# Patient Record
Sex: Female | Born: 1940 | Race: White | Hispanic: No | Marital: Married | Smoking: Never smoker
Health system: Southern US, Community
[De-identification: ages and names within clinical notes are randomized; demographics above are authoritative.]

## PROBLEM LIST (undated history)

## (undated) DIAGNOSIS — E785 Hyperlipidemia, unspecified: Secondary | ICD-10-CM

## (undated) DIAGNOSIS — F32A Depression, unspecified: Secondary | ICD-10-CM

## (undated) DIAGNOSIS — I4891 Unspecified atrial fibrillation: Secondary | ICD-10-CM

## (undated) DIAGNOSIS — I10 Essential (primary) hypertension: Secondary | ICD-10-CM

## (undated) DIAGNOSIS — R011 Cardiac murmur, unspecified: Secondary | ICD-10-CM

## (undated) DIAGNOSIS — I872 Venous insufficiency (chronic) (peripheral): Secondary | ICD-10-CM

## (undated) DIAGNOSIS — J45909 Unspecified asthma, uncomplicated: Secondary | ICD-10-CM

## (undated) DIAGNOSIS — M1990 Unspecified osteoarthritis, unspecified site: Secondary | ICD-10-CM

## (undated) DIAGNOSIS — I509 Heart failure, unspecified: Secondary | ICD-10-CM

## (undated) DIAGNOSIS — G709 Myoneural disorder, unspecified: Secondary | ICD-10-CM

## (undated) DIAGNOSIS — I499 Cardiac arrhythmia, unspecified: Secondary | ICD-10-CM

## (undated) DIAGNOSIS — M797 Fibromyalgia: Secondary | ICD-10-CM

## (undated) DIAGNOSIS — J189 Pneumonia, unspecified organism: Secondary | ICD-10-CM

## (undated) DIAGNOSIS — T7840XA Allergy, unspecified, initial encounter: Secondary | ICD-10-CM

## (undated) HISTORY — DX: Venous insufficiency (chronic) (peripheral): I87.2

## (undated) HISTORY — DX: Hyperlipidemia, unspecified: E78.5

## (undated) HISTORY — PX: KNEE SURGERY: SHX244

## (undated) HISTORY — DX: Myoneural disorder, unspecified: G70.9

## (undated) HISTORY — PX: ANKLE SURGERY: SHX546

## (undated) HISTORY — DX: Heart failure, unspecified: I50.9

## (undated) HISTORY — DX: Essential (primary) hypertension: I10

## (undated) HISTORY — DX: Allergy, unspecified, initial encounter: T78.40XA

## (undated) HISTORY — PX: VARICOSE VEIN SURGERY: SHX832

---

## 1998-01-15 ENCOUNTER — Encounter: Payer: Self-pay | Admitting: Obstetrics and Gynecology

## 1998-01-15 ENCOUNTER — Ambulatory Visit (HOSPITAL_COMMUNITY): Admission: RE | Admit: 1998-01-15 | Discharge: 1998-01-15 | Payer: Self-pay | Admitting: Obstetrics and Gynecology

## 1999-01-07 ENCOUNTER — Other Ambulatory Visit: Admission: RE | Admit: 1999-01-07 | Discharge: 1999-01-07 | Payer: Self-pay | Admitting: Obstetrics and Gynecology

## 1999-01-17 ENCOUNTER — Ambulatory Visit (HOSPITAL_COMMUNITY): Admission: RE | Admit: 1999-01-17 | Discharge: 1999-01-17 | Payer: Self-pay | Admitting: Obstetrics and Gynecology

## 1999-01-17 ENCOUNTER — Encounter: Payer: Self-pay | Admitting: Obstetrics and Gynecology

## 2000-02-09 ENCOUNTER — Ambulatory Visit (HOSPITAL_COMMUNITY): Admission: RE | Admit: 2000-02-09 | Discharge: 2000-02-09 | Payer: Self-pay | Admitting: Obstetrics and Gynecology

## 2000-02-09 ENCOUNTER — Encounter: Payer: Self-pay | Admitting: Obstetrics and Gynecology

## 2000-02-26 ENCOUNTER — Ambulatory Visit (HOSPITAL_COMMUNITY): Admission: RE | Admit: 2000-02-26 | Discharge: 2000-02-26 | Payer: Self-pay | Admitting: Internal Medicine

## 2000-05-14 ENCOUNTER — Ambulatory Visit (HOSPITAL_COMMUNITY): Admission: RE | Admit: 2000-05-14 | Discharge: 2000-05-14 | Payer: Self-pay | Admitting: Gastroenterology

## 2001-06-10 ENCOUNTER — Encounter: Payer: Self-pay | Admitting: Obstetrics and Gynecology

## 2001-06-10 ENCOUNTER — Ambulatory Visit (HOSPITAL_COMMUNITY): Admission: RE | Admit: 2001-06-10 | Discharge: 2001-06-10 | Payer: Self-pay | Admitting: Obstetrics and Gynecology

## 2002-06-13 ENCOUNTER — Encounter: Payer: Self-pay | Admitting: Obstetrics and Gynecology

## 2002-06-13 ENCOUNTER — Ambulatory Visit (HOSPITAL_COMMUNITY): Admission: RE | Admit: 2002-06-13 | Discharge: 2002-06-13 | Payer: Self-pay | Admitting: Internal Medicine

## 2003-06-21 ENCOUNTER — Ambulatory Visit (HOSPITAL_COMMUNITY): Admission: RE | Admit: 2003-06-21 | Discharge: 2003-06-21 | Payer: Self-pay | Admitting: Obstetrics and Gynecology

## 2004-07-02 ENCOUNTER — Ambulatory Visit (HOSPITAL_COMMUNITY): Admission: RE | Admit: 2004-07-02 | Discharge: 2004-07-02 | Payer: Self-pay | Admitting: Obstetrics and Gynecology

## 2004-12-24 ENCOUNTER — Encounter: Admission: RE | Admit: 2004-12-24 | Discharge: 2004-12-24 | Payer: Self-pay | Admitting: Internal Medicine

## 2005-07-06 ENCOUNTER — Ambulatory Visit (HOSPITAL_COMMUNITY): Admission: RE | Admit: 2005-07-06 | Discharge: 2005-07-06 | Payer: Self-pay | Admitting: Internal Medicine

## 2005-08-17 ENCOUNTER — Encounter: Admission: RE | Admit: 2005-08-17 | Discharge: 2005-08-17 | Payer: Self-pay | Admitting: Internal Medicine

## 2006-07-12 ENCOUNTER — Ambulatory Visit (HOSPITAL_COMMUNITY): Admission: RE | Admit: 2006-07-12 | Discharge: 2006-07-12 | Payer: Self-pay | Admitting: Obstetrics and Gynecology

## 2006-08-25 ENCOUNTER — Ambulatory Visit: Payer: Self-pay | Admitting: Vascular Surgery

## 2006-12-10 ENCOUNTER — Ambulatory Visit: Payer: Self-pay | Admitting: Vascular Surgery

## 2007-07-13 ENCOUNTER — Ambulatory Visit (HOSPITAL_COMMUNITY): Admission: RE | Admit: 2007-07-13 | Discharge: 2007-07-13 | Payer: Self-pay | Admitting: Obstetrics and Gynecology

## 2007-08-05 ENCOUNTER — Ambulatory Visit: Payer: Self-pay | Admitting: Vascular Surgery

## 2007-08-31 ENCOUNTER — Ambulatory Visit: Payer: Self-pay | Admitting: Vascular Surgery

## 2007-09-07 ENCOUNTER — Ambulatory Visit: Payer: Self-pay | Admitting: Vascular Surgery

## 2008-01-04 ENCOUNTER — Ambulatory Visit: Payer: Self-pay | Admitting: Vascular Surgery

## 2008-01-11 ENCOUNTER — Ambulatory Visit: Payer: Self-pay | Admitting: Vascular Surgery

## 2008-02-24 ENCOUNTER — Ambulatory Visit: Payer: Self-pay | Admitting: Vascular Surgery

## 2008-07-30 ENCOUNTER — Ambulatory Visit (HOSPITAL_COMMUNITY): Admission: RE | Admit: 2008-07-30 | Discharge: 2008-07-30 | Payer: Self-pay | Admitting: Obstetrics and Gynecology

## 2009-08-01 ENCOUNTER — Ambulatory Visit (HOSPITAL_COMMUNITY): Admission: RE | Admit: 2009-08-01 | Discharge: 2009-08-01 | Payer: Self-pay | Admitting: Obstetrics and Gynecology

## 2010-04-30 ENCOUNTER — Encounter: Payer: Self-pay | Admitting: Internal Medicine

## 2010-06-03 NOTE — Procedures (Signed)
DUPLEX DEEP VENOUS EXAM - LOWER EXTREMITY   INDICATION:  Followup evaluation status post left greater saphenous vein  ablation.   HISTORY:  Edema:  Edema in the left leg has resolved considerably since  the ablation last week.  Trauma/Surgery:  EVLT of left greater saphenous vein on 08/31/2007.  Pain:  Left thigh pain.  PE:  No.  Previous DVT:  No.  Anticoagulants:  No.  Other:  Known incompetent right greater saphenous vein.   DUPLEX EXAM:                CFV   SFV   PopV  PTV    GSV                R  L  R  L  R  L  R   L  R  L  Thrombosis    0  0     0     0      0  0  +  Spontaneous   +  +     +     +      +  +  0  Phasic        +  +     +     +      +  +  0  Augmentation  +  +     +     +      +  +  0  Compressible  +  +     +     +      +  +  0  Competent     0  +     +     +      +  0  0   Legend:  + - yes  o - no  p - partial  D - decreased   IMPRESSION:  1. 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  2. The left common femoral vein is patent with no thrombus within.  3. Left circumflex vein is patent.  4. No left leg DVT or Baker's cyst.  5. Right common femoral vein and greater saphenous veins are      incompetent.    _____________________________  Larina Earthly, M.D.   MC/MEDQ  D:  09/08/2007  T:  09/08/2007  Job:  [id number]

## 2010-06-03 NOTE — Procedures (Signed)
DUPLEX DEEP VENOUS EXAM - LOWER EXTREMITY   INDICATION:  Followup right greater saphenous vein ablation.   HISTORY:  Edema:  No.  Trauma/Surgery:  Yes.  Pain:  Mild.  PE:  No.  Previous DVT:  No.  Anticoagulants:  None.  Other:   DUPLEX EXAM:                CFV   SFV   PopV  PTV    GSV                R  L  R  L  R  L  R   L  R  L  Thrombosis    0  0  0     0     0      +  Spontaneous   0     0     0     0      0  Phasic        0     0     0     0      0  Augmentation  0     0     0     0      0  Compressible  0     0     0     0      0  Competent           0     0     0      0   Legend:  + - yes  o - no  p - partial  D - decreased   IMPRESSION:  1. No evidence of DVT noted in the right leg.  2. The right greater saphenous vein appears ablated from the      saphenofemoral junction to below knee level.    _____________________________  Larina Earthly, M.D.   MG/MEDQ  D:  01/11/2008  T:  01/11/2008  Job:  [id number]

## 2010-06-03 NOTE — Assessment & Plan Note (Signed)
OFFICE VISIT   [name], Brandi  DOB:  May 08, 1940                                       08/05/2007  CHART#:[id number]   The patient presents today for continued followup of her severe venous  hypertension and venous varicosities.  She reports that she is having  increasingly severe discomfort with prolonged standing, this is more  severe on her left leg than on her right.  She has pain with prolonged  standing with the swelling and pain over varicosities as well.  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  She also reports  bending and squatting are very painful and difficult due to leg pain and  swelling, which makes it difficult to do housework.  She does live in a  2-storey house and reports going up and down stairs difficult due to leg  pain and swelling as well.  She has work graduated compression stockings  20-30 mmHg for greater than 3 months with no relief of symptoms.  She  does elevate her legs when possible and does take non-steroidal anti  inflammatory ibuprofen 600 mg t.i.d. without improvement of her  symptoms.  She does have palpable pedal pulses bilaterally and has  marked varicosities bilaterally.  Her deep venous duplex has shown no  incompetence in her deep system with gross reflux in her saphenous veins  bilaterally.  I discussed options with the patient.  I have recommended  laser ablation and stab phlebectomy for relief of her symptoms.  She  understands this and wishes to proceed at her earliest convenience.   Larina Earthly, M.D.  Electronically Signed   TFE/MEDQ  D:  08/05/2007  T:  08/08/2007  Job:  [id number]   cc:   Minerva Areola L. August Saucer, M.D.
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
## 2010-06-03 NOTE — Assessment & Plan Note (Signed)
OFFICE VISIT   [name], Brandi  DOB:  [birth year]                                       12/10/2006  CHART#:[id number]   The patient presents for continued followup of her severe bilateral  venous hypertension.  She has worn graduated compression stockings for  greater than 3 months and has had no relief of her symptoms.  She  continues to elevate her legs when possible, but has no relief of her  symptoms related to this, and also takes ibuprofen 600 mg t.i.d. for her  discomfort.  She reports that she continues to have discomfort with  prolonged standing and other activities of daily living.  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  She also reports difficulty going up  and down 3 flights of stairs secondary to pain with this, and also has  difficulty performing her housework.  She reports also pain with sitting  to drive for long periods of time, and also reports difficulty falling  asleep secondary to leg pain.  She underwent formal duplex today in our  office, and this confirms incompetence of her saphenous vein  bilaterally.  There is no evidence of tortuosity or aneurismal change in  her saphenous veins.  I discussed options with the patient and  recommended that we proceed with staged bilateral laser ablation of her  saphenous veins and tributary phlebectomies for the relief of her  symptoms.  She understands this and wishes to proceed as soon as we have  assured insurance coverage.   Larina Earthly, M.D.  Electronically Signed   TFE/MEDQ  D:  12/10/2006  T:  12/13/2006  Job:  [id number]

## 2010-06-03 NOTE — Assessment & Plan Note (Signed)
OFFICE VISIT   Largent, Michala  DOB:  05-11-1940                                       01/11/2008  CHART#:[id number]   The patient presents today for a 1 week followup after laser ablation of  her right great saphenous vein and stab phlebectomy of multiple  tributary varicosities.  She has done quite well with her procedure.  She does have some erythema and thickening in the laser portion in the  above knee medial thigh.  The stab sites all look quite good with the  usual amount of bruising in her calf.   Her duplex today reveals closure of her saphenous vein from her knee up  to her saphenofemoral junction and no evidence of deep venous  thrombosis.  I am quite pleased with her initial result as is the  patient.  I plan to see her again in 6 weeks for final followup.   Larina Earthly, M.D.  Electronically Signed   TFE/MEDQ  D:  01/11/2008  T:  01/12/2008  Job:  [id number]   cc:   Minerva Areola L. August Saucer, M.D.

## 2010-06-03 NOTE — Assessment & Plan Note (Signed)
OFFICE VISIT   Brandi [name], Brandi [name]  DOB:  22-Jul-1940                                       02/24/2008  CHART#:[id number]   The patient presents today for continued follow-up after her right leg  laser ablation, stab phlebectomy and multiple tributary varicosities.  She is quite pleased with her result.  She has resolved all of  periprocedural bruising and soreness.  She reports that her leg feels  much better and she is not having any difficulty related to her veins.   PHYSICAL EXAMINATION:  Stab phlebectomy sites have all healed and she  does not have any swelling.   I am pleased with her result and she will see [place] again on an as-needed  basis.   Larina Earthly, M.D.  Electronically Signed   TFE/MEDQ  D:  02/24/2008  T:  02/27/2008  Job:  [id number]   cc:   Minerva Areola L. August Saucer, M.D.

## 2010-06-03 NOTE — Assessment & Plan Note (Signed)
OFFICE VISIT   Denbleyker, Olean  DOB:  May 26, 1940                                       09/07/2007  CHART#:[id number]   The patient presents today 1 week status post laser ablation and stab  phlebectomy of her left great saphenous vein and tributary varicosities.  She has the usual amount of erythema in her thigh from the ablation  site.  The phlebectomy sites are healing quite nicely with mild  bruising.   She is quite pleased with her initial result.  She will continue to wear  her compression garment for 1 additional week daytime only.  She wishes  to have her other leg treated and will notify us when she wishes to  proceed with this.   Larina Earthly, M.D.  Electronically Signed   TFE/MEDQ  D:  09/07/2007  T:  09/08/2007  Job:  [id number]

## 2010-06-06 NOTE — Procedures (Signed)
[facility]  Patient:    Brandi [name], Brandi [name]                         MRN: [id number] Proc. Date: 05/14/00 Adm. Date:  [id number] Attending:  Orland Mustard CC:         Lind Guest. August Saucer, M.D.  Laqueta Linden, M.D.   Procedure Report  PROCEDURE:  Colonoscopy.  MEDICATIONS:  Fentanyl 75 mcg, Versed 8 mg IV.  INDICATIONS FOR PROCEDURE:  Heme positive stool.  DESCRIPTION OF PROCEDURE:  The procedure had been explained to the patient and consent obtained. With the patient in the left lateral decubitus position, the Olympus video colonoscope was inserted and advanced under direct visualization. The prep was excellent. We were able to advance to the cecum without difficulty. The ileocecal valve was seen, scope withdrawn. The cecum, ascending colon, hepatic flexure, transverse colon, splenic flexure, descending and sigmoid colon were seen well. No polyps or any other lesions seen. No diverticula seen. The scope was withdrawn. There were minimal hemorrhoids.  ASSESSMENT:  Positive stool, no gross abnormalities seen in the colon.  PLAN:  Will plan on seeing the patient back in the office in next 6-8 weeks and recheck her stool. DD:  05/14/00 TD:  05/15/00 Job: [id number]

## 2010-06-06 NOTE — Procedures (Signed)
LOWER EXTREMITY VENOUS REFLUX EXAM   INDICATION:  Bilateral varicose veins.   EXAM:  Using color-flow imaging and pulse Doppler spectral analysis, the  right and left common femoral, superficial femoral, popliteal, posterior  tibial, greater and lesser saphenous veins are evaluated.  There is no  evidence suggesting deep venous insufficiency in the left lower  extremity.  The right common femoral vein is mildly incompetent.  The  femoral, popliteal, and posterior tibial veins on the right are  competent.   The right/left saphenofemoral junction are not competent.  The  right/left GSV are not competent with the caliber as described below.   The right proximal short saphenous vein demonstrates competency.  The  left short saphenous vein was not detected.   Right saphenofemoral junction is 0.95 cm.   Left saphenofemoral junction 1.14 cm.   GSV Diameter (used if found to be incompetent only)                                            Right    Left  Proximal Greater Saphenous Vein           0.76 cm.          0.66  cm  Proximal-to-mid-thigh                     0.65 cm  0.87  cm  Mid thigh                                 0.64 cm  0.59 cm  Mid-distal thigh                          0.59 cm  0.66 cm  Distal thigh                              0.91 cm  0.40 cm  Knee                                      0.67 cm  0.69 cm   IMPRESSION:  1. Right/Left greater saphenous vein reflux is identified with the      caliber ranging from 0.67 cm to 0.95 cm knee to groin on the right.      Diameters on the left are 0.69 cm to 1.14 cm from knee to groin.  2. The right/left greater saphenous veins are not aneurysmal.  3. The right/left greater saphenous veins are not tortuous.  4. There is no evidence of significant deep venous insufficiency in      the left lower extremity.  5. The right CFV was mildly  incompetent.  6. The right lesser saphenous vein is competent.  The left lesser     saphenous vein was not detected.   ___________________________________________  Larina Earthly, M.D.   DP/MEDQ  D:  12/10/2006  T:  12/11/2006  Job:  [id number]

## 2010-08-26 ENCOUNTER — Other Ambulatory Visit: Payer: Self-pay | Admitting: Obstetrics and Gynecology

## 2010-08-26 DIAGNOSIS — Z1231 Encounter for screening mammogram for malignant neoplasm of breast: Secondary | ICD-10-CM

## 2010-08-29 ENCOUNTER — Ambulatory Visit (HOSPITAL_COMMUNITY)
Admission: RE | Admit: 2010-08-29 | Discharge: 2010-08-29 | Disposition: A | Discharge status: Home / Self Care | Payer: 59 | Source: Ambulatory Visit | Attending: Obstetrics and Gynecology | Admitting: Obstetrics and Gynecology

## 2010-08-29 DIAGNOSIS — Z1231 Encounter for screening mammogram for malignant neoplasm of breast: Secondary | ICD-10-CM | POA: Insufficient documentation

## 2011-09-09 ENCOUNTER — Other Ambulatory Visit: Payer: Self-pay | Admitting: Obstetrics and Gynecology

## 2011-09-09 DIAGNOSIS — Z1231 Encounter for screening mammogram for malignant neoplasm of breast: Secondary | ICD-10-CM

## 2011-10-16 ENCOUNTER — Ambulatory Visit (HOSPITAL_COMMUNITY)
Admission: RE | Admit: 2011-10-16 | Discharge: 2011-10-16 | Disposition: A | Discharge status: Home / Self Care | Payer: Medicare (Managed Care) | Source: Ambulatory Visit | Attending: Obstetrics and Gynecology | Admitting: Obstetrics and Gynecology

## 2011-10-16 DIAGNOSIS — Z1231 Encounter for screening mammogram for malignant neoplasm of breast: Secondary | ICD-10-CM | POA: Insufficient documentation

## 2011-10-16 LAB — HM MAMMOGRAPHY: HM Mammogram: NEGATIVE

## 2011-12-11 LAB — CBC AND DIFFERENTIAL
HCT: 35 % — AB (ref 36–46)
Hemoglobin: 11.4 g/dL — AB (ref 12.0–16.0)
Platelets: 243 10*3/uL (ref 150–399)
WBC: 6.6 10^3/mL

## 2011-12-11 LAB — HEPATIC FUNCTION PANEL
ALT: 14 U/L (ref 7–35)
AST: 17 U/L (ref 13–35)
Alkaline Phosphatase: 80 U/L (ref 25–125)

## 2011-12-11 LAB — BASIC METABOLIC PANEL
BUN: 12 mg/dL (ref 4–21)
Glucose: 107 mg/dL

## 2011-12-11 LAB — LIPID PANEL
LDL Cholesterol: 18 mg/dL
Triglycerides: 88 mg/dL (ref 40–160)

## 2012-03-18 ENCOUNTER — Encounter: Payer: Self-pay | Admitting: General Practice

## 2012-03-18 DIAGNOSIS — E669 Obesity, unspecified: Secondary | ICD-10-CM | POA: Insufficient documentation

## 2012-03-18 DIAGNOSIS — F329 Major depressive disorder, single episode, unspecified: Secondary | ICD-10-CM | POA: Insufficient documentation

## 2012-09-20 ENCOUNTER — Other Ambulatory Visit (HOSPITAL_COMMUNITY): Payer: Self-pay | Admitting: Internal Medicine

## 2012-09-20 DIAGNOSIS — Z1231 Encounter for screening mammogram for malignant neoplasm of breast: Secondary | ICD-10-CM

## 2012-10-25 ENCOUNTER — Ambulatory Visit (HOSPITAL_COMMUNITY): Payer: Medicare Other

## 2012-12-12 ENCOUNTER — Ambulatory Visit (HOSPITAL_COMMUNITY)
Admission: RE | Admit: 2012-12-12 | Discharge: 2012-12-12 | Disposition: A | Discharge status: Home / Self Care | Payer: Medicare Other | Source: Ambulatory Visit | Attending: Internal Medicine | Admitting: Internal Medicine

## 2012-12-12 DIAGNOSIS — Z1231 Encounter for screening mammogram for malignant neoplasm of breast: Secondary | ICD-10-CM | POA: Insufficient documentation

## 2017-06-07 DIAGNOSIS — R079 Chest pain, unspecified: Secondary | ICD-10-CM | POA: Insufficient documentation

## 2017-06-07 DIAGNOSIS — R9431 Abnormal electrocardiogram [ECG] [EKG]: Secondary | ICD-10-CM | POA: Insufficient documentation

## 2017-06-07 NOTE — Progress Notes (Signed)
Cardiology Office Note  Date:  06/08/2017   ID:  Brandi [name], DOB 18-Mar-1940, MRN [id number]  PCP:  Gwenyth Bender, MD   Chief Complaint  Patient presents with  . OTHER    Episode of chest discomfort pt denies any discomfort today. Meds reviewed verbally with pt.    HPI:  [name] is a 77 -year-old woman with past medical history of Varicosities,  S/p Laser ablation of right greater saphenous vein  Hypertension Hyperlipidemia Referred by Dr.  Willey Blade for nearsyncope, abnormal EKG  She reports that she had a Spell in church She had abdominal discomfort At to use the bathroom, had a bowel movement During and following this was lightheaded, had chills, felt flush She slowly recovered, Denies any chest pain during this entire. Does not usually have abdominal symptoms  Not very active at baseline, limited by arthritis, knee pain,ankle pain bilaterally Had falls in the past  Statin myalgias Tried livalo, did ok Does not want a statin  EKG personally reviewed by myself on todays visit Shows normal sinus rhythm rate 63 bpm no significant ST-T wave changes, poor R-wave progression through the anterior precordial leads   PMH:   has a past medical history of Allergy, CHF (congestive heart failure) (HCC), Hyperlipidemia, Hypertension, Neuromuscular disorder (HCC), and Venous insufficiency.  PSH:   History reviewed. No pertinent surgical history.  Current Outpatient Medications  Medication Sig Dispense Refill  . aspirin 81 MG tablet Take 81 mg by mouth daily.      . carvedilol (COREG CR) 20 MG 24 hr capsule Take 20 mg by mouth daily.      . celecoxib (CELEBREX) 200 MG capsule Take 200 mg by mouth as needed.     . Cholecalciferol (VITAMIN D-3 PO) Take 1,000 Int'l Units by mouth. D3 1000 I.U.     . felodipine (PLENDIL) 5 MG 24 hr tablet Take 5 mg by mouth 2 (two) times daily.      Marland Kitchen GLUCOSAMINE PO Take 2,000 mg by mouth.      . guanFACINE (TENEX) 2 MG tablet Take 2 mg by  mouth at bedtime.   1 1/2 tab qhs     . Multiple Vitamin (MULTIVITAMIN) tablet Take 1 tablet by mouth daily.    . Omega-3 Fatty Acids (FISH OIL BURP-LESS) 1200 MG CAPS Take by mouth.      . sertraline (ZOLOFT) 50 MG tablet Take 50 mg by mouth daily. HCL 50 mg 1 1/2 qd     . irbesartan (AVAPRO) 150 MG tablet daily.     No current facility-administered medications for this visit.      Allergies:   Amoxicillin   Social History:  The patient  reports that she has never smoked. She has never used smokeless tobacco. She reports that she does not drink alcohol or use drugs.   Family History:   family history includes Heart attack in her father.    Review of Systems: Review of Systems  Constitutional: Negative.   Respiratory: Negative.   Cardiovascular: Negative.   Gastrointestinal: Negative.   Musculoskeletal: Negative.   Neurological: Negative.   Psychiatric/Behavioral: Negative.   All other systems reviewed and are negative.    PHYSICAL EXAM: VS:  BP 114/62 (BP Location: Right Arm, Patient Position: Sitting, Cuff Size: Normal)   Pulse 63   Ht 5' (1.524 m)   Wt 170 lb 4 oz (77.2 kg)   BMI 33.25 kg/m  , BMI Body mass index is 33.25 kg/m. GEN:  Well nourished, well developed, in no acute distress  HEENT: normal  Neck: no JVD, carotid bruits, or masses Cardiac: RRR; no murmurs, rubs, or gallops,no edema  Respiratory:  clear to auscultation bilaterally, normal work of breathing Scant rales, anterior and posterior GI: soft, nontender, nondistended, + BS MS: no deformity or atrophy  Skin: warm and dry, no rash Neuro:  Strength and sensation are intact Psych: euthymic mood, full affect    Recent Labs: No results found for requested labs within last 8760 hours.    Lipid Panel Lab Results  Component Value Date   CHOL 196 12/11/2011   HDL 56 12/11/2011   LDLCALC 18 12/11/2011   TRIG 88 12/11/2011      Wt Readings from Last 3 Encounters:  06/08/17 170 lb 4 oz (77.2  kg)  12/30/11 175 lb (79.4 kg)  02/28/10 173 lb (78.5 kg)       ASSESSMENT AND PLAN:  Vasovagal episode Episode of abdominal pain with bowel movement followed by lightheaded, near syncope Recovered without intervention Most consistent with vasovagal episode Otherwise no prior episodes, none since then If she has recurrent episodes of dizziness or near syncope,  recommended she call our office Arrhythmia workup could be performed  Chest pain with moderate risk for cardiac etiology -  She denies having any significant chest pain with exertion Symptoms detailed above she detailed were abdominal discomfort no chest pain Symptoms seem to improve after bowel movement  Abnormal EKG -  Poor R-wave progression otherwise relatively benign EKG We'll hold off on additional testing at this time  Mixed hyperlipidemia Unable to tolerate statins, did well on Livalo profound is expensive  Disposition:   F/Uas needed   Total encounter time more than 45 minutes  Greater than 50% was spent in counseling and coordination of care with the patient    Orders Placed This Encounter  Procedures  . EKG 12-Lead     Signed, Dossie Arbour, M.D., Ph.D. 06/08/2017  [facility] [place], [place] [phone]

## 2017-06-08 ENCOUNTER — Ambulatory Visit (INDEPENDENT_AMBULATORY_CARE_PROVIDER_SITE_OTHER): Payer: Medicare Other | Admitting: Cardiovascular Disease

## 2017-06-08 ENCOUNTER — Encounter: Payer: Self-pay | Admitting: Cardiovascular Disease

## 2017-06-08 VITALS — BP 114/62 | HR 63 | Ht 60.0 in | Wt 170.2 lb

## 2017-06-08 DIAGNOSIS — E782 Mixed hyperlipidemia: Secondary | ICD-10-CM | POA: Diagnosis not present

## 2017-06-08 DIAGNOSIS — R9431 Abnormal electrocardiogram [ECG] [EKG]: Secondary | ICD-10-CM | POA: Diagnosis not present

## 2017-06-08 DIAGNOSIS — R55 Syncope and collapse: Secondary | ICD-10-CM | POA: Diagnosis not present

## 2017-06-08 DIAGNOSIS — R079 Chest pain, unspecified: Secondary | ICD-10-CM

## 2017-06-08 NOTE — Patient Instructions (Signed)

## 2018-10-13 ENCOUNTER — Other Ambulatory Visit (HOSPITAL_COMMUNITY): Payer: Self-pay | Admitting: Internal Medicine

## 2018-10-13 ENCOUNTER — Other Ambulatory Visit: Payer: Self-pay

## 2018-10-13 ENCOUNTER — Ambulatory Visit (HOSPITAL_COMMUNITY)
Admission: RE | Admit: 2018-10-13 | Discharge: 2018-10-13 | Disposition: A | Discharge status: Home / Self Care | Payer: Medicare Other | Source: Ambulatory Visit | Attending: Family | Admitting: Family

## 2018-10-13 DIAGNOSIS — I872 Venous insufficiency (chronic) (peripheral): Secondary | ICD-10-CM | POA: Diagnosis not present

## 2018-10-13 DIAGNOSIS — I6523 Occlusion and stenosis of bilateral carotid arteries: Secondary | ICD-10-CM

## 2018-10-14 ENCOUNTER — Ambulatory Visit (HOSPITAL_COMMUNITY)
Admission: RE | Admit: 2018-10-14 | Discharge: 2018-10-14 | Disposition: A | Discharge status: Home / Self Care | Payer: Medicare Other | Source: Ambulatory Visit | Attending: Internal Medicine | Admitting: Internal Medicine

## 2018-10-14 DIAGNOSIS — I6523 Occlusion and stenosis of bilateral carotid arteries: Secondary | ICD-10-CM | POA: Diagnosis not present

## 2018-12-01 ENCOUNTER — Telehealth: Payer: Self-pay | Admitting: Cardiovascular Disease

## 2018-12-01 NOTE — Telephone Encounter (Signed)
I left message for the pt to please call our [facility] office for an appt with Dr. Rockey Situ or his care team for surgery clearance. I have given the number [phone] to the [place] office.

## 2018-12-01 NOTE — Telephone Encounter (Signed)
   Primary Cardiologist:Timothy Rockey Situ, MD  Chart reviewed as part of pre-operative protocol coverage. Because of Yariela Tison past medical history and time since last visit, he/she will require a follow-up visit in order to better assess preoperative cardiovascular risk.  Pre-op covering staff: - Please schedule appointment and call patient to inform them. - Please contact requesting surgeon's office via preferred method (i.e, phone, fax) to inform them of need for appointment prior to surgery.  Patient is on aspirin 81 mg daily though does not have known CAD, therefore would be reasonable to hold aspirin 7 days prior to upcoming surgery.  Abigail Butts, PA-C  12/01/2018, 3:21 PM

## 2018-12-01 NOTE — Telephone Encounter (Signed)
   [facility] Pre-operative Risk Assessment    Request for surgical clearance:  1. What type of surgery is being performed? LEFT SHOULDER SCOPE  When is this surgery scheduled? TBD 2. What type of clearance is required (medical clearance vs. Pharmacy clearance to hold med vs. Both)? NOT LISTED  3. Are there any medications that need to be held prior to surgery and how long? NOT LISTED   Practice name and name of physician performing surgery? [facility]  4. What is your office phone number [phone]   7.   What is your office fax number [email or web address]  8.   Anesthesia type (None, local, MAC, general) ? NOT LISTED  PATIENT NEEDS TO BE SEEN AND I HAVE LEFT A VM TO CALL AND SCHEDULE   Brandi [name] 12/01/2018, 1:02 PM  _________________________________________________________________   (provider comments below)

## 2018-12-01 NOTE — Telephone Encounter (Signed)
L MOM TO SCHEDULE SURGICAL CLEARANCE APPT WITH DR Rockey Situ OR APP

## 2018-12-02 NOTE — Telephone Encounter (Signed)
I have left message x 2 for appt for surgery clearance with Dr. Rockey Situ or his Care Team in our [place] office.

## 2018-12-05 NOTE — Telephone Encounter (Signed)
appt with Dr. Rockey Situ 12/21. I will route notes to Dr. Rockey Situ for surgery clearance appt. I will remove from the pre op call back pool. I will route to Dr. French Ana.

## 2018-12-07 NOTE — Telephone Encounter (Signed)
Any provider would do for her appointment if she needs to expedite surgery

## 2018-12-07 NOTE — Telephone Encounter (Signed)
Scheduled with Sharolyn Douglas 12/3

## 2018-12-22 ENCOUNTER — Ambulatory Visit (INDEPENDENT_AMBULATORY_CARE_PROVIDER_SITE_OTHER): Payer: Medicare Other | Admitting: Nurse Practitioner

## 2018-12-22 ENCOUNTER — Other Ambulatory Visit: Payer: Self-pay

## 2018-12-22 ENCOUNTER — Encounter: Payer: Self-pay | Admitting: Nurse Practitioner

## 2018-12-22 VITALS — BP 130/62 | HR 60 | Ht 59.0 in | Wt 170.0 lb

## 2018-12-22 DIAGNOSIS — E782 Mixed hyperlipidemia: Secondary | ICD-10-CM | POA: Diagnosis not present

## 2018-12-22 DIAGNOSIS — Z01810 Encounter for preprocedural cardiovascular examination: Secondary | ICD-10-CM | POA: Diagnosis not present

## 2018-12-22 DIAGNOSIS — I10 Essential (primary) hypertension: Secondary | ICD-10-CM | POA: Diagnosis not present
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
## 2018-12-22 NOTE — Patient Instructions (Signed)
Medication Instructions:  - Your physician recommends that you continue on your current medications as directed. Please refer to the Current Medication list given to you today.  *If you need a refill on your cardiac medications before your next appointment, please call your pharmacy*  Lab Work: - none ordered  If you have labs (blood work) drawn today and your tests are completely normal, you will receive your results only by: Marland Kitchen MyChart Message (if you have MyChart) OR . A paper copy in the mail If you have any lab test that is abnormal or we need to change your treatment, we will call you to review the results.  Testing/Procedures: - none ordered  Follow-Up: At [facility], you and your health needs are our priority.  As part of our continuing mission to provide you with exceptional heart care, we have created designated Provider Care Teams.  These Care Teams include your primary Cardiologist (physician) and Advanced Practice Providers (APPs -  Physician Assistants and Nurse Practitioners) who all work together to provide you with the care you need, when you need it.  Your next appointment:   As needed   The format for your next appointment:   n/a  Provider:   Ida Rogue, MD  Other Instructions n/a

## 2019-01-09 ENCOUNTER — Ambulatory Visit: Payer: Medicare Other | Admitting: Cardiovascular Disease

## 2019-08-09 ENCOUNTER — Ambulatory Visit
Admission: EM | Admit: 2019-08-09 | Discharge: 2019-08-09 | Disposition: A | Discharge status: Home / Self Care | Payer: Medicare Other | Attending: Family Medicine | Admitting: Family Medicine

## 2019-08-09 DIAGNOSIS — M546 Pain in thoracic spine: Secondary | ICD-10-CM

## 2019-08-09 DIAGNOSIS — M545 Low back pain, unspecified: Secondary | ICD-10-CM

## 2019-08-09 DIAGNOSIS — W19XXXA Unspecified fall, initial encounter: Secondary | ICD-10-CM | POA: Diagnosis not present

## 2019-08-09 DIAGNOSIS — R519 Headache, unspecified: Secondary | ICD-10-CM | POA: Diagnosis not present

## 2019-08-09 MED ORDER — TIZANIDINE HCL 2 MG PO CAPS: 2.0000 mg | ORAL_CAPSULE | Freq: Three times a day (TID) | ORAL | 0 refills | Status: DC | PRN

## 2019-08-09 NOTE — ED Triage Notes (Signed)
Pt states around 10am today she tripped over the door jam while carrying in groceries and fell back down 4 or 5 steps hitting/scrapping the top of her head on the concrete. Pt denies LOC, blurred vision or headache. Abrasion noted to top of head with bleeding controlled. Pt c/o mid to lower back pain radiating down buttocks area.

## 2019-08-09 NOTE — ED Provider Notes (Addendum)
[facility]   [id number] 08/09/19 Arrival Time: 1459  NW:GNFAO PAIN  SUBJECTIVE: History from: patient. Brandi [name] is a 79 y.o. female complains of right sided thoracic back pain, low back pain, headache and abrasion to the top of the head after falling down her stairs today at 10am. Describes the pain as constant and achy in character. Has tried tylenol medications with relief. Symptoms are made worse with activity.  Denies similar symptoms in the past.  Denies fever, chills, erythema, ecchymosis, effusion, weakness, numbness and tingling, saddle paresthesias, loss of bowel or bladder function.      ROS: As per HPI.  All other pertinent ROS negative.     Past Medical History:  Diagnosis Date  . Allergy   . Hyperlipidemia   . Hypertension   . Neuromuscular disorder (HCC)   . Venous insufficiency    Past Surgical History:  Procedure Laterality Date  . ANKLE SURGERY Left   . KNEE SURGERY Left   . VARICOSE VEIN SURGERY     Allergies  Allergen Reactions  . Amoxicillin    No current facility-administered medications on file prior to encounter.   Current Outpatient Medications on File Prior to Encounter  Medication Sig Dispense Refill  . aspirin 81 MG tablet Take 81 mg by mouth daily.      . carvedilol (COREG CR) 20 MG 24 hr capsule Take 20 mg by mouth daily.      . celecoxib (CELEBREX) 200 MG capsule Take 200 mg by mouth as needed.     . Cholecalciferol (VITAMIN D-3 PO) Take 1,000 Int'l Units by mouth. D3 1000 I.U.     . felodipine (PLENDIL) 5 MG 24 hr tablet Take 5 mg by mouth 2 (two) times daily.      Marland Kitchen GLUCOSAMINE PO Take 2,000 mg by mouth.      . guanFACINE (TENEX) 2 MG tablet Take 2 mg by mouth at bedtime. 2mg   1 1/2 tab qhs     . irbesartan (AVAPRO) 150 MG tablet daily.    . Multiple Vitamin (MULTIVITAMIN) tablet Take 1 tablet by mouth daily.    . Omega-3 Fatty Acids (FISH OIL BURP-LESS) 1200 MG CAPS Take by mouth.      . sertraline (ZOLOFT) 50 MG tablet Take  50 mg by mouth daily. HCL 50 mg 1 1/2 qd      Social History   Socioeconomic History  . Marital status: Married    Spouse name: Not on file  . Number of children: Not on file  . Years of education: Not on file  . Highest education level: Not on file  Occupational History  . Not on file  Tobacco Use  . Smoking status: Never Smoker  . Smokeless tobacco: Never Used  Vaping Use  . Vaping Use: Never used  Substance and Sexual Activity  . Alcohol use: Never  . Drug use: Never  . Sexual activity: Not on file  Other Topics Concern  . Not on file  Social History Narrative  . Not on file   Social Determinants of Health   Financial Resource Strain:   . Difficulty of Paying Living Expenses:   Food Insecurity:   . Worried About in the Last Year:   . Programme researcher, broadcasting/film/video in the Last Year:   Transportation Needs:   . Barista (Medical):   Freight forwarder Lack of Transportation (Non-Medical):   Physical Activity:   . Days of Exercise per Week:   .  Minutes of Exercise per Session:   Stress:   . Feeling of Stress :   Social Connections:   . Frequency of Communication with Friends and Family:   . Frequency of Social Gatherings with Friends and Family:   . Attends Religious Services:   . Active Member of Clubs or Organizations:   . Attends Banker Meetings:   Marland Kitchen Marital Status:   Intimate Partner Violence:   . Fear of Current or Ex-Partner:   . Emotionally Abused:   Marland Kitchen Physically Abused:   . Sexually Abused:    Family History  Problem Relation Age of Onset  . Heart attack Father     OBJECTIVE:  Vitals:   08/09/19 1555  BP: (!) 180/98  Pulse: 68  Resp: 18  Temp: 98.5 F (36.9 C)  TempSrc: Oral  SpO2: 93%    General appearance: ALERT; in no acute distress.  Head: NCAT Lungs: Normal respiratory effort CV:  pulses 2+ bilaterally. Cap refill < 2 seconds Musculoskeletal:  Inspection: Skin warm, dry, clear and intact without obvious erythema,  effusion, or ecchymosis.  Palpation: Nontender to palpation ROM: FROM active and passive Skin: warm and dry, abrasion to the top of the head Neurologic: Ambulates without difficulty; Sensation intact about the upper/ lower extremities Psychological: alert and cooperative; normal mood and affect  DIAGNOSTIC STUDIES:  No results found.   ASSESSMENT & PLAN:  1. Acute bilateral low back pain without sciatica   2. Fall, initial encounter   3. Nonintractable headache, unspecified chronicity pattern, unspecified headache type   4. Acute right-sided thoracic back pain       Meds ordered this encounter  Medications  . tizanidine (ZANAFLEX) 2 MG capsule    Sig: Take 1 capsule (2 mg total) by mouth 3 (three) times daily as needed for muscle spasms.    Dispense:  14 capsule    Refill:  0    Order Specific Question:   Supervising Provider    Answer:   Merrilee Jansky [[id number]]    Discussed with patient that I would like her to go to the ER for head CT, she declines at this time Cannot rule out bleeding on the brain in the office Continue conservative management of rest, ice, and gentle stretches Take tylenol as needed for pain relief (may cause abdominal discomfort, ulcers, and GI bleeds avoid taking with other NSAIDs) Take tizanidine at nighttime for symptomatic relief. Avoid driving or operating heavy machinery while using medication. Follow up with PCP if symptoms persist Return or go to the ER if you have any new or worsening symptoms (fever, chills, chest pain, abdominal pain, changes in bowel or bladder habits, pain radiating into lower legs, nausea, vomiting, changes in vision, loss of balance)  Reviewed expectations re: course of current medical issues. Questions answered. Outlined signs and symptoms indicating need for more acute intervention. Patient verbalized understanding. After Visit Summary given.       Moshe Cipro, NP 08/09/19 1614    Moshe Cipro, NP 08/09/19 1615

## 2019-08-09 NOTE — Discharge Instructions (Addendum)
Take tylenol as needed for your pain. Take the muscle relaxer tizanidine as needed for muscle spasm; do not drive, operate machinery, or drink alcohol with this medication as it may make you drowsy.    Go to the ER for any nausea, vomiting, loss of balance, changes in vision, changes in sensation or muscle control, other concerning symptoms  Follow up with an orthopedist if your pain is not improving.  Go to the emergency department if you have worsening pain or develop new symptoms such as difficulty with urination, weakness, numbness, loss of control of your bladder or bowels, fever, chills or other concerns.

## 2019-09-22 ENCOUNTER — Other Ambulatory Visit: Payer: Self-pay

## 2019-09-22 ENCOUNTER — Ambulatory Visit
Admission: RE | Admit: 2019-09-22 | Discharge: 2019-09-22 | Disposition: A | Discharge status: Home / Self Care | Payer: Medicare Other | Source: Ambulatory Visit | Attending: Internal Medicine | Admitting: Internal Medicine

## 2019-09-22 ENCOUNTER — Other Ambulatory Visit: Payer: Self-pay | Admitting: Internal Medicine

## 2019-09-22 DIAGNOSIS — R0781 Pleurodynia: Secondary | ICD-10-CM

## 2019-11-29 ENCOUNTER — Other Ambulatory Visit: Payer: Self-pay

## 2019-11-29 ENCOUNTER — Ambulatory Visit (INDEPENDENT_AMBULATORY_CARE_PROVIDER_SITE_OTHER): Payer: Medicare Other

## 2019-11-29 ENCOUNTER — Encounter: Payer: Self-pay | Admitting: Podiatry

## 2019-11-29 ENCOUNTER — Ambulatory Visit (INDEPENDENT_AMBULATORY_CARE_PROVIDER_SITE_OTHER): Payer: Medicare Other | Admitting: Podiatry

## 2019-11-29 DIAGNOSIS — M79671 Pain in right foot: Secondary | ICD-10-CM | POA: Diagnosis not present

## 2019-11-29 DIAGNOSIS — M2041 Other hammer toe(s) (acquired), right foot: Secondary | ICD-10-CM

## 2019-11-29 DIAGNOSIS — M79672 Pain in left foot: Secondary | ICD-10-CM

## 2019-11-29 DIAGNOSIS — M778 Other enthesopathies, not elsewhere classified: Secondary | ICD-10-CM

## 2019-11-29 NOTE — Progress Notes (Signed)
Subjective:   Patient ID: Brandi [name], female   DOB: 79 y.o.   MRN: [id number]   HPI Patient presents stating she had an injury several years ago and she damaged her right second toe and is lifted completely in the area is not functioning and she has some rotation of her fourth toe.  States it is hard to wear shoe gear and it becomes painful in her foot and does have a bunion which has developed that is not currently painful.  Patient does not smoke and is reasonably active   Review of Systems  All other systems reviewed and are negative.       Objective:  Physical Exam Vitals and nursing note reviewed.  Constitutional:      Appearance: She is well-developed.  Pulmonary:     Effort: Pulmonary effort is normal.  Musculoskeletal:        General: Normal range of motion.  Skin:    General: Skin is warm.  Neurological:     Mental Status: She is alert.     Neurovascular status intact muscle strength was found to be adequate range of motion adequate.  Patient is found to have a severely rigidly contracted digit to right that is pressing against the metatarsal phalangeal joint and a rotated fourth digit right at the distal joint.  It is nonfunctioning and not providing any stability to the hallux which rest against the third toe and moderate deformity also noted in the left foot but not the same type of deformity     Assessment:  Severe elevated second toe with probability of complete disruption of the flexor plate causing this to occur several years ago with rigid contracture noted along with distal deformity digit 4 right and moderate bunion deformity bilateral     Plan:  H&P reviewed conditions.  I do think unfortunately for this condition the best long-term solution of the amputation of the second digit along with distal arthritic digit 4.  I did explain this and that there is no way I can relocate that toe due to the severity of the deformity and patient understands this and is  going to have this done next year and will decide on best time to do this.  I did go again over the unfortunate element of this and the fact that I cannot control the rigid contracture of the toe.  Patient will be seen back prior to surgery to go over greater detail  X-rays indicate that there is severe lateral dislocation digit to right at the MPJ with arthritis of the MPJs bunion deformity bilateral and hammertoe deformity/

## 2019-12-05 ENCOUNTER — Other Ambulatory Visit: Payer: Self-pay | Admitting: Podiatry

## 2019-12-05 DIAGNOSIS — M778 Other enthesopathies, not elsewhere classified: Secondary | ICD-10-CM

## 2019-12-05 DIAGNOSIS — M2041 Other hammer toe(s) (acquired), right foot: Secondary | ICD-10-CM

## 2020-05-13 ENCOUNTER — Other Ambulatory Visit: Payer: Self-pay

## 2020-05-13 ENCOUNTER — Ambulatory Visit (HOSPITAL_COMMUNITY)
Admission: RE | Admit: 2020-05-13 | Discharge: 2020-05-13 | Disposition: A | Discharge status: Home / Self Care | Payer: Medicare Other | Source: Ambulatory Visit | Attending: Internal Medicine | Admitting: Internal Medicine

## 2020-05-13 ENCOUNTER — Other Ambulatory Visit (HOSPITAL_COMMUNITY): Payer: Self-pay | Admitting: Internal Medicine

## 2020-05-13 DIAGNOSIS — R600 Localized edema: Secondary | ICD-10-CM

## 2020-07-05 ENCOUNTER — Other Ambulatory Visit (HOSPITAL_COMMUNITY): Payer: Self-pay | Admitting: Internal Medicine

## 2020-07-05 ENCOUNTER — Other Ambulatory Visit: Payer: Self-pay

## 2020-07-05 ENCOUNTER — Ambulatory Visit (HOSPITAL_COMMUNITY)
Admission: RE | Admit: 2020-07-05 | Discharge: 2020-07-05 | Disposition: A | Discharge status: Home / Self Care | Payer: Medicare Other | Source: Ambulatory Visit | Attending: Internal Medicine | Admitting: Internal Medicine

## 2020-07-05 DIAGNOSIS — I6523 Occlusion and stenosis of bilateral carotid arteries: Secondary | ICD-10-CM | POA: Insufficient documentation

## 2020-10-29 ENCOUNTER — Other Ambulatory Visit: Payer: Self-pay

## 2020-10-29 ENCOUNTER — Ambulatory Visit
Admission: RE | Admit: 2020-10-29 | Discharge: 2020-10-29 | Disposition: A | Discharge status: Home / Self Care | Payer: Medicare Other | Source: Ambulatory Visit | Attending: Internal Medicine | Admitting: Internal Medicine

## 2020-10-29 ENCOUNTER — Other Ambulatory Visit: Payer: Self-pay | Admitting: Internal Medicine

## 2020-10-29 DIAGNOSIS — R062 Wheezing: Secondary | ICD-10-CM

## 2020-10-29 DIAGNOSIS — R053 Chronic cough: Secondary | ICD-10-CM

## 2021-02-28 ENCOUNTER — Encounter: Payer: Self-pay | Admitting: Cardiovascular Disease

## 2021-03-07 ENCOUNTER — Ambulatory Visit
Admission: EM | Admit: 2021-03-07 | Discharge: 2021-03-07 | Disposition: A | Discharge status: Home / Self Care | Payer: Medicare Other | Attending: Physician Assistant | Admitting: Physician Assistant

## 2021-03-07 ENCOUNTER — Other Ambulatory Visit: Payer: Self-pay

## 2021-03-07 ENCOUNTER — Ambulatory Visit (INDEPENDENT_AMBULATORY_CARE_PROVIDER_SITE_OTHER): Payer: Medicare Other

## 2021-03-07 DIAGNOSIS — M1711 Unilateral primary osteoarthritis, right knee: Secondary | ICD-10-CM | POA: Diagnosis not present

## 2021-03-07 DIAGNOSIS — M25561 Pain in right knee: Secondary | ICD-10-CM | POA: Diagnosis not present

## 2021-03-07 NOTE — ED Triage Notes (Signed)
3 wk h/o waxing and waning right knee pain. Pt saw her PCP and was tx with prednisone (last dose taken yesterday). Pt reports sxs have worsened with her knee feeling like it's swollen. Notes pain with ambulation. Pt has also been taking celebrex.  No falls or injuries.

## 2021-03-07 NOTE — ED Provider Notes (Signed)
[facility]    CSN: [id number] Arrival date & time: 03/07/21  1719      History   Chief Complaint Chief Complaint  Patient presents with   Knee Pain    right    HPI Brandi [name] is a 81 y.o. female.   Patient here today for evaluation of significant right knee pain that has been waxing and waning for the last 3 weeks. She has known arthritis. She was prescribed prednisone and finished her prescription of same yesterday. She reports symptoms have seemingly worsened. Pain worse with weight bearing and walking. She has also been taking celebrex without relief. She denies any falls or injuries.   The history is provided by the patient.   Past Medical History:  Diagnosis Date   Allergy    Hyperlipidemia    Hypertension    Neuromuscular disorder (HCC)    Venous insufficiency     Patient Active Problem List   Diagnosis Date Noted   Vasovagal episode 06/08/2017   Mixed hyperlipidemia 06/08/2017   Chest pain with moderate risk for cardiac etiology 06/07/2017   Abnormal EKG 06/07/2017   Depression 03/18/2012   Mild obesity 03/18/2012    Past Surgical History:  Procedure Laterality Date   ANKLE SURGERY Left    KNEE SURGERY Left    VARICOSE VEIN SURGERY      OB History   No obstetric history on file.      Home Medications    Prior to Admission medications   Medication Sig Start Date End Date Taking? Authorizing Provider  aspirin 81 MG tablet Take 81 mg by mouth daily.      [provider]  azelastine (ASTELIN) 0.1 % nasal spray Place into both nostrils. 06/07/19   [provider]  carvedilol (COREG CR) 20 MG 24 hr capsule Take 20 mg by mouth daily.      [provider]  carvedilol (COREG) 12.5 MG tablet Take 12.5 mg by mouth 2 (two) times daily. 11/02/19   [provider]  celecoxib (CELEBREX) 200 MG capsule Take 200 mg by mouth as needed.     [provider]  Cholecalciferol (VITAMIN D-3 PO) Take 1,000  Int'l Units by mouth. D3 1000 I.U.     [provider]  felodipine (PLENDIL) 5 MG 24 hr tablet Take 5 mg by mouth 2 (two) times daily.      [provider]  GLUCOSAMINE PO Take 2,000 mg by mouth.      [provider]  guanFACINE (TENEX) 2 MG tablet Take 2 mg by mouth at bedtime. 2mg   1 1/2 tab qhs     [provider]  irbesartan (AVAPRO) 150 MG tablet daily. 04/22/17   [provider]  Multiple Vitamin (MULTIVITAMIN) tablet Take 1 tablet by mouth daily.    [provider]  Omega-3 Fatty Acids (FISH OIL BURP-LESS) 1200 MG CAPS Take by mouth.      [provider]  rosuvastatin (CRESTOR) 20 MG tablet Take by mouth. 10/10/19   [provider]  sertraline (ZOLOFT) 50 MG tablet Take 50 mg by mouth daily. HCL 50 mg 1 1/2 qd     [provider]  tizanidine (ZANAFLEX) 2 MG capsule Take 1 capsule (2 mg total) by mouth 3 (three) times daily as needed for muscle spasms. 08/09/19   08/11/19, NP    Family History Family History  Problem Relation Age of Onset   Heart attack Father     Social  History Social History   Tobacco Use   Smoking status: Never   Smokeless tobacco: Never  Vaping Use   Vaping Use: Never used  Substance Use Topics   Alcohol use: Never   Drug use: Never     Allergies   Amoxicillin   Review of Systems Review of Systems  Constitutional:  Negative for chills and fever.  Eyes:  Negative for discharge and redness.  Gastrointestinal:  Negative for abdominal pain, nausea and vomiting.  Musculoskeletal:  Positive for arthralgias.    Physical Exam Triage Vital Signs ED Triage Vitals  Enc Vitals Group     BP      Pulse      Resp      Temp      Temp src      SpO2      Weight      Height      Head Circumference      Peak Flow      Pain Score      Pain Loc      Pain Edu?      Excl. in GC?    No data found.  Updated Vital Signs BP (!) 172/82 (BP Location: Right Arm)  Comment: Missed afternoon dose of BP meds   Pulse 62    Temp 98.5 F (36.9 C) (Oral)    Resp 18    SpO2 94%      Physical Exam Vitals and nursing note reviewed.  Constitutional:      General: She is not in acute distress.    Appearance: Normal appearance. She is not ill-appearing.     Comments: In wheelchair  HENT:     Head: Normocephalic and atraumatic.  Eyes:     Conjunctiva/sclera: Conjunctivae normal.  Cardiovascular:     Rate and Rhythm: Normal rate.  Pulmonary:     Effort: Pulmonary effort is normal.  Neurological:     Mental Status: She is alert.  Psychiatric:        Mood and Affect: Mood normal.        Behavior: Behavior normal.        Thought Content: Thought content normal.     UC Treatments / Results  Labs (all labs ordered are listed, but only abnormal results are displayed) Labs Reviewed - No data to display  EKG   Radiology DG Knee Complete 4 Views Right  Result Date: 03/07/2021 CLINICAL DATA:  Waxing and waning right knee pain, crepitus EXAM: RIGHT KNEE - COMPLETE 4+ VIEW COMPARISON:  None. FINDINGS: Frontal, bilateral oblique, lateral views of the right knee are obtained. Bones are osteopenic. No acute displaced fracture, subluxation, or dislocation. Three compartmental osteoarthritis, most pronounced in the patellofemoral compartment with moderate joint space narrowing and osteophyte formation. Moderate suprapatellar joint effusion. Soft tissues are unremarkable. IMPRESSION: 1. Marked osteopenia. 2. No acute displaced fracture. 3. 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 Electronically Signed   By: Sharlet Salina M.D.   On: 03/07/2021 18:58    Procedures Procedures (including critical care time)  Medications Ordered in UC Medications - No data to display  Initial Impression / Assessment and Plan / UC Course  I have reviewed the triage vital signs and the nursing  notes.  Pertinent labs & imaging results that were available during my care of the patient were reviewed by me and considered in my medical decision making (see chart for details).    Xray with significant  arthritis. Discussed this with patient and that she may benefit from intraarticular injection which we are unable to do here in urgent care. Contact info given for ortho urgent care that will open tomorrow morning in hopes they will be able to assist with injection. No further oral treatment indicated at this time. Discussed use of other topical analgesics in the meantime (icy  hot, etc. )  Final Clinical Impressions(s) / UC Diagnoses   Final diagnoses:  Arthritis of right knee   Discharge Instructions   None    ED Prescriptions   None    PDMP not reviewed this encounter.   Tomi Bamberger, PA-C 03/07/21 1921

## 2021-03-24 ENCOUNTER — Encounter: Payer: Self-pay | Admitting: Cardiovascular Disease

## 2021-03-24 ENCOUNTER — Ambulatory Visit (INDEPENDENT_AMBULATORY_CARE_PROVIDER_SITE_OTHER): Payer: Medicare Other | Admitting: Cardiovascular Disease

## 2021-03-24 ENCOUNTER — Other Ambulatory Visit: Payer: Self-pay

## 2021-03-24 VITALS — BP 150/68 | HR 67 | Ht <= 58 in | Wt 144.0 lb

## 2021-03-24 DIAGNOSIS — R600 Localized edema: Secondary | ICD-10-CM | POA: Diagnosis not present

## 2021-03-24 DIAGNOSIS — T466X5A Adverse effect of antihyperlipidemic and antiarteriosclerotic drugs, initial encounter: Secondary | ICD-10-CM

## 2021-03-24 DIAGNOSIS — M791 Myalgia, unspecified site: Secondary | ICD-10-CM

## 2021-03-24 DIAGNOSIS — E782 Mixed hyperlipidemia: Secondary | ICD-10-CM

## 2021-03-24 DIAGNOSIS — I739 Peripheral vascular disease, unspecified: Secondary | ICD-10-CM

## 2021-03-24 NOTE — Progress Notes (Signed)
Cardiology Office Note ? ?Date:  03/24/2021  ? ?ID:  Brandi [name], DOB 29-Dec-1940, MRN [id number] ? ?PCP:  Gwenyth Bender, MD  ? ?Chief Complaint  ?Patient presents with  ? Shortness of Breath  ?  Patient c/o shortness of breath with exertion and LE edema with more on the left. Medications reviewed by the patient verbally.   ? ? ?HPI:  ?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 ?LOV with myself 05/2017 ?Seen by Ward Givens in 2020 ? ?In follow-up today reports having significant Right knee pain, ?Had cortisone shot, hoping to avoid surgery ? ?Discussed her medications with her ?Crestor 1/2 pill 2x a week ? ?BP at home:  ?125 to 130/75 at baseline ? ?Blood pressure running higher recently, ?Feels that BP elevated in setting of prednisone/knee pain ? ?Meds refilled by Dr. August Saucer ?Has been out of felodipine x 1 week ? ?Reports that Dr. August Saucer did some blood work that showed some cardiac abnormality, she is unaware of the details ?Lab work has been requested ? ?Denies significant shortness of breath on exertion, no chest pain concerning for angina ? ?Carotid 6/22 reviewed ?Right Carotid: Velocities in the right ICA are consistent with a 1-39%  stenosis.  Extremely tortuous proximal right CCA.  ?Left Carotid: Velocities in the left ICA are consistent with a 1-39%  stenosis.  ? ?EKG personally reviewed by myself on todays visit ?NSr rate 67 no ST or T wave changes ? ?PMH:   has a past medical history of Allergy, Hyperlipidemia, Hypertension, Neuromuscular disorder (HCC), and Venous insufficiency. ? ?PSH:    ?Past Surgical History:  ?Procedure Laterality Date  ? ANKLE SURGERY Left   ? KNEE SURGERY Left   ? VARICOSE VEIN SURGERY    ? ? ?Current Outpatient Medications  ?Medication Sig Dispense Refill  ? aspirin 81 MG tablet Take 81 mg by mouth daily.       ? azelastine (ASTELIN) 0.1 % nasal spray Place into both nostrils.    ? carvedilol (COREG) 12.5 MG tablet Take 12.5 mg by mouth 2 (two) times daily.    ? celecoxib (CELEBREX) 200 MG capsule Take 200 mg by mouth as needed.     ? Cholecalciferol (VITAMIN D-3 PO) Take 1,000 Int'l Units by mouth. D3 1000 I.U.     ? felodipine (PLENDIL) 5 MG 24 hr tablet Take 5 mg by mouth 2 (two) times daily.      ? GLUCOSAMINE PO Take 2,000 mg by mouth.      ? guanFACINE (TENEX) 2 MG tablet Take 2 mg by mouth at bedtime. 2mg   1 1/2 tab qhs     ? irbesartan (AVAPRO) 150 MG tablet daily.    ? Multiple Vitamin (MULTIVITAMIN) tablet Take 1 tablet by mouth daily.    ? Omega-3 Fatty Acids (FISH OIL BURP-LESS) 1200 MG CAPS Take by mouth.      ? rosuvastatin (CRESTOR) 20 MG tablet Take by mouth.    ? sertraline (ZOLOFT) 50 MG tablet Take 50 mg by mouth daily. HCL 50 mg 1 1/2 qd     ? tizanidine (ZANAFLEX) 2 MG capsule Take 1 capsule (2 mg total) by mouth 3 (three) times daily as needed for muscle spasms. (Patient not taking: Reported on 03/24/2021) 14 capsule 0  ? ?No current facility-administered medications for this visit.  ? ? ?  Allergies:   Amoxicillin  ? ?Social History:  The patient  reports that she has never smoked. She has never used smokeless tobacco. She reports that she does not drink alcohol and does not use drugs.  ? ?Family History:   family history includes Heart attack in her father.  ? ?Review of Systems: ?Review of Systems  ?Constitutional: Negative.   ?Respiratory: Negative.    ?Cardiovascular: Negative.   ?Gastrointestinal: Negative.   ?Musculoskeletal: Negative.   ?Neurological: Negative.   ?Psychiatric/Behavioral: Negative.    ?All other systems reviewed and are negative. ? ? ?PHYSICAL EXAM: ?VS:  BP (!) 150/68 (BP Location: Left Arm, Patient Position: Sitting, Cuff Size: Normal)   Pulse 67   Ht 4\' 10"  (1.473 m)   Wt 144 lb (65.3 kg)   SpO2 98%   BMI 30.10 kg/m?  , BMI Body mass index is 30.1 kg/m? ?Constitutional:   oriented to person, place, and time. No distress.  ?HENT:  ?Head: Grossly normal ?Eyes:  no discharge. No scleral icterus.  ?Neck: No JVD, no carotid bruits  ?Cardiovascular: Regular rate and rhythm, no murmurs appreciated ?Pulmonary/Chest: Clear to auscultation bilaterally, no wheezes or rails ?Abdominal: Soft.  no distension.  no tenderness.  ?Musculoskeletal: Normal range of motion ?Neurological:  normal muscle tone. Coordination normal. No atrophy ?Skin: Skin warm and dry ?Psychiatric: normal affect, pleasant ? ?Recent Labs: ?No results found for requested labs within last 8760 hours.  ? ? ?Lipid Panel ?Lab Results  ?Component Value Date  ? CHOL 196 12/11/2011  ? HDL 56 12/11/2011  ? LDLCALC 18 12/11/2011  ? TRIG 88 12/11/2011  ? ?  ? ?Wt Readings from Last 3 Encounters:  ?03/24/21 144 lb (65.3 kg)  ?12/22/18 170 lb (77.1 kg)  ?06/08/17 170 lb 4 oz (77.2 kg)  ?  ? ?ASSESSMENT AND PLAN: ? ?Vasovagal episode ?No recent episodes ? ?Chest pain with moderate risk for cardiac etiology -  ?Denies any shortness of breath or chest pain concerning for angina ?Lab work requested from Dr. 06/10/17 ? ?Mixed hyperlipidemia ?Crestor 2x a week ?Statin intolerance ? ? Total encounter time more than 30 minutes ? Greater than 50% was spent in counseling and coordination of care with the patient ? ? ?Orders Placed This Encounter  ?Procedures  ? EKG 12-Lead  ? ? ? ?Signed, ?August Saucer, M.D., Ph.D. ?03/24/2021  ?[facility], [place] ?[phone] ? ?

## 2021-03-24 NOTE — Patient Instructions (Addendum)
Medication Instructions:  ?Only taking crestor 1/2 pill twice a week ? ?Only taking felodipine once a day ? ?If you need a refill on your cardiac medications before your next appointment, please call your pharmacy.  ? ?Lab work: ?No new labs needed ? ?Testing/Procedures: ?No new testing needed ? ?Follow-Up: ?At [facility], you and your health needs are our priority.  As part of our continuing mission to provide you with exceptional heart care, we have created designated Provider Care Teams.  These Care Teams include your primary Cardiologist (physician) and Advanced Practice Providers (APPs -  Physician Assistants and Nurse Practitioners) who all work together to provide you with the care you need, when you need it. ? ?You will need a follow up appointment as needed ? ?Providers on your designated Care Team:   ?Nicolasa Ducking, NP ?Eula Listen, PA-C ?Cadence Fransico Michael, PA-C ? ?COVID-19 Vaccine Information can be found at: [email or web address] For questions related to vaccine distribution or appointments, please email [email or web address] or call [phone].  ? ?

## 2021-05-02 ENCOUNTER — Encounter: Payer: Self-pay | Admitting: Cardiovascular Disease

## 2021-07-07 ENCOUNTER — Emergency Department (HOSPITAL_COMMUNITY): Payer: Medicare Other

## 2021-07-07 ENCOUNTER — Other Ambulatory Visit: Payer: Self-pay

## 2021-07-07 ENCOUNTER — Inpatient Hospital Stay (HOSPITAL_COMMUNITY)
Admission: EM | Admit: 2021-07-07 | Discharge: 2021-07-12 | DRG: 193 | Disposition: A | Discharge status: Home / Self Care | Payer: Medicare Other | Source: Ambulatory Visit | Attending: Internal Medicine | Admitting: Internal Medicine

## 2021-07-07 ENCOUNTER — Encounter (HOSPITAL_COMMUNITY): Payer: Self-pay

## 2021-07-07 DIAGNOSIS — I480 Paroxysmal atrial fibrillation: Secondary | ICD-10-CM | POA: Diagnosis not present

## 2021-07-07 DIAGNOSIS — R7989 Other specified abnormal findings of blood chemistry: Secondary | ICD-10-CM | POA: Diagnosis present

## 2021-07-07 DIAGNOSIS — Z886 Allergy status to analgesic agent status: Secondary | ICD-10-CM

## 2021-07-07 DIAGNOSIS — I272 Pulmonary hypertension, unspecified: Secondary | ICD-10-CM | POA: Diagnosis present

## 2021-07-07 DIAGNOSIS — R0602 Shortness of breath: Secondary | ICD-10-CM | POA: Diagnosis not present

## 2021-07-07 DIAGNOSIS — I5032 Chronic diastolic (congestive) heart failure: Secondary | ICD-10-CM | POA: Diagnosis present

## 2021-07-07 DIAGNOSIS — I872 Venous insufficiency (chronic) (peripheral): Secondary | ICD-10-CM | POA: Diagnosis present

## 2021-07-07 DIAGNOSIS — Z7982 Long term (current) use of aspirin: Secondary | ICD-10-CM

## 2021-07-07 DIAGNOSIS — F32A Depression, unspecified: Secondary | ICD-10-CM | POA: Diagnosis not present

## 2021-07-07 DIAGNOSIS — J9601 Acute respiratory failure with hypoxia: Secondary | ICD-10-CM | POA: Diagnosis not present

## 2021-07-07 DIAGNOSIS — N179 Acute kidney failure, unspecified: Secondary | ICD-10-CM | POA: Diagnosis not present

## 2021-07-07 DIAGNOSIS — N1832 Chronic kidney disease, stage 3b: Secondary | ICD-10-CM | POA: Diagnosis present

## 2021-07-07 DIAGNOSIS — I248 Other forms of acute ischemic heart disease: Secondary | ICD-10-CM | POA: Diagnosis present

## 2021-07-07 DIAGNOSIS — Z6830 Body mass index (BMI) 30.0-30.9, adult: Secondary | ICD-10-CM

## 2021-07-07 DIAGNOSIS — Z8249 Family history of ischemic heart disease and other diseases of the circulatory system: Secondary | ICD-10-CM

## 2021-07-07 DIAGNOSIS — I4892 Unspecified atrial flutter: Secondary | ICD-10-CM | POA: Diagnosis not present

## 2021-07-07 DIAGNOSIS — E669 Obesity, unspecified: Secondary | ICD-10-CM | POA: Diagnosis present

## 2021-07-07 DIAGNOSIS — R778 Other specified abnormalities of plasma proteins: Secondary | ICD-10-CM | POA: Diagnosis present

## 2021-07-07 DIAGNOSIS — I739 Peripheral vascular disease, unspecified: Secondary | ICD-10-CM | POA: Diagnosis present

## 2021-07-07 DIAGNOSIS — Z79899 Other long term (current) drug therapy: Secondary | ICD-10-CM

## 2021-07-07 DIAGNOSIS — J189 Pneumonia, unspecified organism: Principal | ICD-10-CM | POA: Diagnosis present

## 2021-07-07 DIAGNOSIS — Z880 Allergy status to penicillin: Secondary | ICD-10-CM

## 2021-07-07 DIAGNOSIS — E782 Mixed hyperlipidemia: Secondary | ICD-10-CM | POA: Diagnosis present

## 2021-07-07 DIAGNOSIS — E876 Hypokalemia: Secondary | ICD-10-CM | POA: Diagnosis present

## 2021-07-07 DIAGNOSIS — F419 Anxiety disorder, unspecified: Secondary | ICD-10-CM | POA: Diagnosis present

## 2021-07-07 DIAGNOSIS — E860 Dehydration: Secondary | ICD-10-CM | POA: Diagnosis present

## 2021-07-07 DIAGNOSIS — D631 Anemia in chronic kidney disease: Secondary | ICD-10-CM | POA: Diagnosis present

## 2021-07-07 DIAGNOSIS — E1122 Type 2 diabetes mellitus with diabetic chronic kidney disease: Secondary | ICD-10-CM | POA: Diagnosis present

## 2021-07-07 DIAGNOSIS — R2681 Unsteadiness on feet: Secondary | ICD-10-CM | POA: Diagnosis present

## 2021-07-07 DIAGNOSIS — I130 Hypertensive heart and chronic kidney disease with heart failure and stage 1 through stage 4 chronic kidney disease, or unspecified chronic kidney disease: Secondary | ICD-10-CM | POA: Diagnosis present

## 2021-07-07 DIAGNOSIS — I4430 Unspecified atrioventricular block: Secondary | ICD-10-CM | POA: Diagnosis not present

## 2021-07-07 LAB — CBC WITH DIFFERENTIAL/PLATELET
Abs Immature Granulocytes: 0.18 10*3/uL — ABNORMAL HIGH (ref 0.00–0.07)
Basophils Absolute: 0 10*3/uL (ref 0.0–0.1)
Basophils Relative: 0 %
Eosinophils Absolute: 0 10*3/uL (ref 0.0–0.5)
Eosinophils Relative: 0 %
HCT: 31.8 % — ABNORMAL LOW (ref 36.0–46.0)
Hemoglobin: 10.4 g/dL — ABNORMAL LOW (ref 12.0–15.0)
Immature Granulocytes: 1 %
Lymphocytes Relative: 4 %
Lymphs Abs: 0.9 10*3/uL (ref 0.7–4.0)
MCH: 28.2 pg (ref 26.0–34.0)
MCHC: 32.7 g/dL (ref 30.0–36.0)
MCV: 86.2 fL (ref 80.0–100.0)
Monocytes Absolute: 1.3 10*3/uL — ABNORMAL HIGH (ref 0.1–1.0)
Monocytes Relative: 6 %
Neutro Abs: 17.5 10*3/uL — ABNORMAL HIGH (ref 1.7–7.7)
Neutrophils Relative %: 89 %
Platelets: 277 10*3/uL (ref 150–400)
RBC: 3.69 MIL/uL — ABNORMAL LOW (ref 3.87–5.11)
RDW: 13 % (ref 11.5–15.5)
WBC: 19.9 10*3/uL — ABNORMAL HIGH (ref 4.0–10.5)
nRBC: 0 % (ref 0.0–0.2)

## 2021-07-07 LAB — BASIC METABOLIC PANEL
Anion gap: 15 (ref 5–15)
BUN: 17 mg/dL (ref 8–23)
CO2: 22 mmol/L (ref 22–32)
Calcium: 9.2 mg/dL (ref 8.9–10.3)
Chloride: 94 mmol/L — ABNORMAL LOW (ref 98–111)
Creatinine, Ser: 1.52 mg/dL — ABNORMAL HIGH (ref 0.44–1.00)
GFR, Estimated: 34 mL/min — ABNORMAL LOW (ref >60)
Glucose, Bld: 114 mg/dL — ABNORMAL HIGH (ref 70–99)
Potassium: 4 mmol/L (ref 3.5–5.1)
Sodium: 131 mmol/L — ABNORMAL LOW (ref 135–145)

## 2021-07-07 LAB — TROPONIN I (HIGH SENSITIVITY)
Troponin I (High Sensitivity): 66 ng/L — ABNORMAL HIGH (ref <18)
Troponin I (High Sensitivity): 45 ng/L — ABNORMAL HIGH (ref <18)

## 2021-07-07 MED ORDER — SODIUM CHLORIDE 0.9 % IV SOLN
1.0000 g | INTRAVENOUS | Status: DC
  Administered 2021-07-07 – 2021-07-11 (×5): 1 g via INTRAVENOUS
  Filled 2021-07-07 (×5): qty 10

## 2021-07-07 MED ORDER — SODIUM CHLORIDE 0.9% FLUSH
3.0000 mL | Freq: Two times a day (BID) | INTRAVENOUS | Status: DC
  Administered 2021-07-08 – 2021-07-12 (×7): 3 mL via INTRAVENOUS

## 2021-07-07 MED ORDER — ACETAMINOPHEN 325 MG PO TABS: 650.0000 mg | ORAL_TABLET | Freq: Four times a day (QID) | ORAL | Status: DC | PRN

## 2021-07-07 MED ORDER — ROSUVASTATIN CALCIUM 20 MG PO TABS
20.0000 mg | ORAL_TABLET | ORAL | Status: DC
  Administered 2021-07-09 – 2021-07-12 (×2): 20 mg via ORAL
  Filled 2021-07-07 (×2): qty 1

## 2021-07-07 MED ORDER — SERTRALINE HCL 50 MG PO TABS
50.0000 mg | ORAL_TABLET | Freq: Every day | ORAL | Status: DC
  Administered 2021-07-08 – 2021-07-12 (×5): 50 mg via ORAL
  Filled 2021-07-07 (×5): qty 1

## 2021-07-07 MED ORDER — CARVEDILOL 12.5 MG PO TABS
12.5000 mg | ORAL_TABLET | Freq: Two times a day (BID) | ORAL | Status: DC
  Administered 2021-07-08 – 2021-07-10 (×7): 12.5 mg via ORAL
  Filled 2021-07-07 (×7): qty 1

## 2021-07-07 MED ORDER — ACETAMINOPHEN 650 MG RE SUPP: 650.0000 mg | Freq: Four times a day (QID) | RECTAL | Status: DC | PRN

## 2021-07-07 MED ORDER — FELODIPINE ER 5 MG PO TB24
5.0000 mg | ORAL_TABLET | Freq: Two times a day (BID) | ORAL | Status: DC
  Administered 2021-07-08 – 2021-07-11 (×7): 5 mg via ORAL
  Filled 2021-07-07 (×9): qty 1

## 2021-07-07 MED ORDER — SODIUM CHLORIDE 0.9 % IV SOLN: INTRAVENOUS | Status: DC

## 2021-07-07 MED ORDER — GUAIFENESIN 100 MG/5ML PO LIQD
10.0000 mL | ORAL | Status: DC | PRN
Start: 2021-07-07 — End: 2021-07-12
  Administered 2021-07-08 – 2021-07-11 (×7): 10 mL via ORAL
  Filled 2021-07-07 (×7): qty 10

## 2021-07-07 MED ORDER — DEXTROSE 5 % IV SOLN
250.0000 mg | INTRAVENOUS | Status: DC
  Administered 2021-07-07 – 2021-07-11 (×5): 250 mg via INTRAVENOUS
  Filled 2021-07-07 (×9): qty 2.5

## 2021-07-07 MED ORDER — POLYETHYLENE GLYCOL 3350 17 G PO PACK: 17.0000 g | PACK | Freq: Every day | ORAL | Status: DC | PRN

## 2021-07-07 MED ORDER — ACETAMINOPHEN 325 MG PO TABS
650.0000 mg | ORAL_TABLET | Freq: Once | ORAL | Status: AC
  Administered 2021-07-07: 650 mg via ORAL
  Filled 2021-07-07: qty 2

## 2021-07-07 MED ORDER — ENOXAPARIN SODIUM 30 MG/0.3ML IJ SOSY
30.0000 mg | PREFILLED_SYRINGE | INTRAMUSCULAR | Status: DC
  Administered 2021-07-07 – 2021-07-10 (×4): 30 mg via SUBCUTANEOUS
  Filled 2021-07-07 (×4): qty 0.3

## 2021-07-07 MED ORDER — ROSUVASTATIN CALCIUM 20 MG PO TABS: 20.0000 mg | ORAL_TABLET | Freq: Every day | ORAL | Status: DC

## 2021-07-07 NOTE — ED Triage Notes (Signed)
Sent from PCP having sob x 3 days and cough and cant catch breath.  Rhonchi lower left.  Initial sats 85%RA patient now on 4L [place] at 95%

## 2021-07-07 NOTE — ED Notes (Signed)
Pt denies any pain, reports she keeps coughing and is requesting cough medicine

## 2021-07-07 NOTE — ED Provider Triage Note (Signed)
Emergency Medicine Provider Triage Evaluation Note  Toriana Sponsel , a 81 y.o. female  was evaluated in triage.  Pt complains of productive cough, shortness of breath and chest tightness.  Symptoms have been going on for few days.  She has lower extremity swelling but this is not unusual for her.  Seen by PCP this morning and was told to come to the ER for further evaluation.  Denies any hemoptysis.  Review of Systems  Positive: Cough, shortness of breath, chest tightness Negative: Hemoptysis  Physical Exam  There were no vitals taken for this visit. Gen:   Awake, no distress   Resp:  Normal effort  MSK:   Moves extremities without difficulty  Other:  Supplemental oxygen by nasal cannula.  No signs of distress.  Bilateral lower extremity edema that appear symmetrical  Medical Decision Making  Medically screening exam initiated at 3:35 PM.  Appropriate orders placed.  Karina Nofsinger was informed that the remainder of the evaluation will be completed by another provider, this initial triage assessment does not replace that evaluation, and the importance of remaining in the ED until their evaluation is complete.  Imaging and work-up initiated   Dietrich Pates, PA-C 07/07/21 1537

## 2021-07-07 NOTE — H&P (Addendum)
History and Physical   Brandi [name] FAO:[id number] DOB: 1940/03/18 DOA: 07/07/2021  PCP: Gwenyth Bender, MD   Patient coming from: Home  Chief Complaint: Shortness of breath  HPI: Brandi [name] is a 81 y.o. female with medical history significant of depression, anxiety, hyperlipidemia, hypertension, venous insufficiency presenting with shortness of breath.  Patient reports 3 days of shortness of breath with associated cough, at times productive of yellow-green sputum.  Went to be evaluated by her PCP today and was found to be hypoxic and was sent to the ED for further evaluation.  Also reports some decreased p.o. intake.  Denies fevers, chills, chest pain, abdominal pain, constipation, diarrhea, nausea, vomiting.  ED Course: Vital signs in the ED significant for hypoxia on room air to 85% improved on 4 L.  Lab work-up included BMP with sodium 131, chloride 94, creatinine elevated to 1.52 from unclear baseline the most recent labs from years ago shows normal creatinine, glucose 114.  CBC with significant leukocytosis 19.9, hemoglobin 10.4 all labs showed baseline around 11.  Troponin mildly elevated to 66 with repeat pending.  Chest x-ray showing patchy airspace consolidation bilaterally consistent with pneumonia and small left pleural effusion.  Patient received ceftriaxone and Tylenol in the ED.  Review of Systems: As per HPI otherwise all other systems reviewed and are negative.  Past Medical History:  Diagnosis Date   Allergy    Hyperlipidemia    Hypertension    Neuromuscular disorder (HCC)    Venous insufficiency     Past Surgical History:  Procedure Laterality Date   ANKLE SURGERY Left    KNEE SURGERY Left    VARICOSE VEIN SURGERY      Social History  reports that she has never smoked. She has never used smokeless tobacco. She reports that she does not drink alcohol and does not use drugs.  Allergies  Allergen Reactions   Amoxicillin Diarrhea    Family History  Problem  Relation Age of Onset   Heart attack Father   Reviewed on admission  Prior to Admission medications   Medication Sig Start Date End Date Taking? Authorizing Provider  aspirin 81 MG tablet Take 81 mg by mouth daily.      [provider]  azelastine (ASTELIN) 0.1 % nasal spray Place into both nostrils. 06/07/19   [provider]  carvedilol (COREG) 12.5 MG tablet Take 12.5 mg by mouth 2 (two) times daily. 11/02/19   [provider]  celecoxib (CELEBREX) 200 MG capsule Take 200 mg by mouth as needed.     [provider]  Cholecalciferol (VITAMIN D-3 PO) Take 1,000 Int'l Units by mouth. D3 1000 I.U.     [provider]  felodipine (PLENDIL) 5 MG 24 hr tablet Take 5 mg by mouth 2 (two) times daily.      [provider]  GLUCOSAMINE PO Take 2,000 mg by mouth.      [provider]  guanFACINE (TENEX) 2 MG tablet Take 2 mg by mouth at bedtime. 2mg   1 1/2 tab qhs     [provider]  irbesartan (AVAPRO) 150 MG tablet daily. 04/22/17   [provider]  Multiple Vitamin (MULTIVITAMIN) tablet Take 1 tablet by mouth daily.    [provider]  Omega-3 Fatty Acids (FISH OIL BURP-LESS) 1200 MG CAPS Take by mouth.      [provider]  rosuvastatin (CRESTOR) 20 MG tablet Take by mouth. 10/10/19   [provider]  sertraline (ZOLOFT) 50  MG tablet Take 50 mg by mouth daily. HCL 50 mg 1 1/2 qd     [provider]    Physical Exam: Vitals:   07/07/21 1845 07/07/21 1900 07/07/21 1915 07/07/21 1930  BP: 139/63 127/67 (!) 127/56 (!) 145/67  Pulse: 98 81 78 85  Resp: 20 17 18 19   Temp:      TempSrc:      SpO2: 93% 96% 96% 97%  Weight:      Height:        Physical Exam Constitutional:      General: She is not in acute distress.    Appearance: Normal appearance.  HENT:     Head: Normocephalic and atraumatic.     Mouth/Throat:     Mouth: Mucous membranes are moist.     Pharynx: Oropharynx is  clear.  Eyes:     Extraocular Movements: Extraocular movements intact.     Pupils: Pupils are equal, round, and reactive to light.  Cardiovascular:     Rate and Rhythm: Normal rate and regular rhythm.     Pulses: Normal pulses.     Heart sounds: Normal heart sounds.  Pulmonary:     Effort: Pulmonary effort is normal. No respiratory distress.     Breath sounds: Rhonchi present.  Abdominal:     General: Bowel sounds are normal. There is no distension.     Palpations: Abdomen is soft.     Tenderness: There is no abdominal tenderness.  Musculoskeletal:        General: No swelling or deformity.  Skin:    General: Skin is warm and dry.  Neurological:     General: No focal deficit present.     Mental Status: Mental status is at baseline.    Labs on Admission: I have personally reviewed following labs and imaging studies  CBC: Recent Labs  Lab 07/07/21 1543  WBC 19.9*  NEUTROABS 17.5*  HGB 10.4*  HCT 31.8*  MCV 86.2  PLT 99991111    Basic Metabolic Panel: Recent Labs  Lab 07/07/21 1543  NA 131*  K 4.0  CL 94*  CO2 22  GLUCOSE 114*  BUN 17  CREATININE 1.52*  CALCIUM 9.2    GFR: Estimated Creatinine Clearance: 23.6 mL/min (A) (by C-G formula based on SCr of 1.52 mg/dL (H)).  Liver Function Tests: No results for input(s): "AST", "ALT", "ALKPHOS", "BILITOT", "PROT", "ALBUMIN" in the last 168 hours.  Urine analysis: No results found for: "COLORURINE", "APPEARANCEUR", "LABSPEC", "PHURINE", "GLUCOSEU", "HGBUR", "BILIRUBINUR", "KETONESUR", "PROTEINUR", "UROBILINOGEN", "NITRITE", "LEUKOCYTESUR"  Radiological Exams on Admission: DG Chest 2 View  Result Date: 07/07/2021 CLINICAL DATA:  Shortness of breath and cough EXAM: CHEST - 2 VIEW COMPARISON:  10/29/2020 FINDINGS: Small left pleural effusion. Patchy consolidations in the right mid lung and left base suspicious for pneumonia. Stable cardiomediastinal silhouette. No pneumothorax. Chronic compression fracture of the lower  thoracic spine. IMPRESSION: 1. Patchy airspace consolidations within the bilateral lungs suspicious for pneumonia. Small left pleural effusion. Radiographic follow-up to resolution is recommended. Two Electronically Signed   By: Donavan Foil M.D.   On: 07/07/2021 16:34    EKG: Independently reviewed.  Sinus rhythm at 87 bpm.  PVC noted. Similar to previous.  Assessment/Plan Principal Problem:   Acute respiratory failure with hypoxia (HCC) Active Problems:   Depression   Mild obesity   Mixed hyperlipidemia   CAP (community acquired pneumonia)   AKI (acute kidney injury) ([place])   Troponin level elevated   Acute respiratory failure with  hypoxia Pneumonia > Patient presenting with 3 days of shortness of breath with associated cough.  Not reporting any fevers or chills however.  Went to PCP noted to be hypoxic and sent to the ED. > In the ED leukocytosis to 19.9, hypoxic on room air to 85% improved on 4 L. > Chest x-ray with patchy airspace disease consistent with bilateral pneumonia and small left pleural effusion. - Monitor on telemetry - Continue with ceftriaxone and add azithromycin - Trend fever curve and WBC - Check Legionella and strep urinary antigen - Supplemental oxygen, wean as tolerated - Supportive care  AKI > Creatinine elevated to 1.52 in the ED but unclear baseline most recent labs were years ago however those are normal. > Does report decreased p.o. intake - IV fluids overnight - Trend renal function and electrolytes  Troponin elevation > Mildly elevated troponin in the ED likely setting of heart strain secondary to pneumonia.  Initial high-sensitivity troponin of 66, repeat pending. - Trend troponin  Anemia > Hemoglobin is 10.4 in the ED unclear baseline though labs from years ago around 11. > States she recently started iron. - Continue to trend CBC  Hyperlipidemia - Continue rosuvastatin  Hypertension - Continue Coreg, felodipine - Hold home irbesartan in  the setting of suspected AKI as above  Depression - Continue home sertraline  Obesity - Noted  DVT prophylaxis: Lovenox Code Status:   Partial.  No ACLS, shock, compressions. Okay with intubation if temporary. Family Communication:   Admission, patient states family is aware of her admission. Disposition Plan:   Patient is from:  Home  Anticipated DC to:  Home  Anticipated DC date:  1 to 2 days  Anticipated DC barriers: None  Consults called:  None Admission status:  Observation, telemetry  Severity of Illness: The appropriate patient status for this patient is OBSERVATION. Observation status is judged to be reasonable and necessary in order to provide the required intensity of service to ensure the patient's safety. The patient's presenting symptoms, physical exam findings, and initial radiographic and laboratory data in the context of their medical condition is felt to place them at decreased risk for further clinical deterioration. Furthermore, it is anticipated that the patient will be medically stable for discharge from the hospital within 2 midnights of admission.    Synetta Fail MD [facility]  How to contact the [facility] Attending or Consulting provider 7A - 7P or covering provider during after hours 7P -7A, for this patient?   Check the care team in [facility] and look for a) attending/consulting [facility] provider listed and b) the [facility] team listed Log into [email or web address] and use [place]'s universal password to access. If you do not have the password, please contact the hospital operator. Locate the [facility] provider you are looking for under [facility] and page to a number that you can be directly reached. If you still have difficulty reaching the provider, please page the [facility] (Director on Call) for the Hospitalists listed on amion for assistance.  07/07/2021, 7:50 PM

## 2021-07-07 NOTE — ED Provider Notes (Signed)
[facility] EMERGENCY DEPARTMENT Provider Note   CSN: [id number] Arrival date & time: 07/07/21  1522     History  Chief Complaint  Patient presents with   Shortness of Breath    Brandi [name] is a 81 y.o. female with a past medical history significant for hypertension, hyperlipidemia, PAD who presents with concern for worsening shortness of breath, cough, inability to catch breath.  She was seen evaluated by her PCP today.  She was 85% on room air, she is with normal oxygen saturation, decreased work of breathing on 4 L nasal cannula.  Her PCP had noted some rhonchi on the lower left.  She denies any chest pain, nausea, vomiting but does endorse some decreased appetite.  She denies fever, chills.   Shortness of Breath Associated symptoms: cough        Home Medications Prior to Admission medications   Medication Sig Start Date End Date Taking? Authorizing Provider  aspirin 81 MG tablet Take 81 mg by mouth daily.      [provider]  azelastine (ASTELIN) 0.1 % nasal spray Place into both nostrils. 06/07/19   [provider]  carvedilol (COREG) 12.5 MG tablet Take 12.5 mg by mouth 2 (two) times daily. 11/02/19   [provider]  celecoxib (CELEBREX) 200 MG capsule Take 200 mg by mouth as needed.     [provider]  Cholecalciferol (VITAMIN D-3 PO) Take 1,000 Int'l Units by mouth. D3 1000 I.U.     [provider]  felodipine (PLENDIL) 5 MG 24 hr tablet Take 5 mg by mouth 2 (two) times daily.      [provider]  GLUCOSAMINE PO Take 2,000 mg by mouth.      [provider]  guanFACINE (TENEX) 2 MG tablet Take 2 mg by mouth at bedtime. 2mg   1 1/2 tab qhs     [provider]  irbesartan (AVAPRO) 150 MG tablet daily. 04/22/17   [provider]  Multiple Vitamin (MULTIVITAMIN) tablet Take 1 tablet by mouth daily.    [provider]  Omega-3 Fatty Acids (FISH OIL BURP-LESS) 1200 MG  CAPS Take by mouth.      [provider]  rosuvastatin (CRESTOR) 20 MG tablet Take by mouth. 10/10/19   [provider]  sertraline (ZOLOFT) 50 MG tablet Take 50 mg by mouth daily. HCL 50 mg 1 1/2 qd     [provider]      Allergies    Amoxicillin    Review of Systems   Review of Systems  Respiratory:  Positive for cough and shortness of breath.   All other systems reviewed and are negative.   Physical Exam Updated Vital Signs BP 131/69   Pulse 77   Temp 99.6 F (37.6 C) (Oral)   Resp 20   Ht 4\' 10"  (1.473 m)   Wt 65.3 kg   SpO2 96%   BMI 30.10 kg/m  Physical Exam Vitals and nursing note reviewed.  Constitutional:      General: She is not in acute distress.    Appearance: Normal appearance.     Comments: Somewhat ill-appearing patient in no acute distress at time of my evaluation   HENT:     Head: Normocephalic and atraumatic.  Eyes:     General:        Right eye: No discharge.        Left eye: No discharge.  Cardiovascular:     Rate and Rhythm:  Normal rate and regular rhythm.     Heart sounds: No murmur heard.    No friction rub. No gallop.  Pulmonary:     Effort: Pulmonary effort is normal. Tachypnea present.     Breath sounds: Normal breath sounds.     Comments: Focal consolidation, increased rhonchi noted left lower lobe.  Overall work of breathing on 4 L nasal cannula, minimal tachypnea.  No significant wheezing. Chest:     Comments: No tenderness palpation of the chest wall Abdominal:     General: Bowel sounds are normal.     Palpations: Abdomen is soft.     Comments: No tenderness palpation of the abdomen  Skin:    General: Skin is warm and dry.     Capillary Refill: Capillary refill takes less than 2 seconds.  Neurological:     General: No focal deficit present.     Mental Status: She is alert and oriented to person, place, and time.  Psychiatric:        Mood and Affect: Mood normal.        Behavior: Behavior normal.      ED Results / Procedures / Treatments   Labs (all labs ordered are listed, but only abnormal results are displayed) Labs Reviewed  BASIC METABOLIC PANEL - Abnormal; Notable for the following components:      Result Value   Sodium 131 (*)    Chloride 94 (*)    Glucose, Bld 114 (*)    Creatinine, Ser 1.52 (*)    GFR, Estimated 34 (*)    All other components within normal limits  CBC WITH DIFFERENTIAL/PLATELET - Abnormal; Notable for the following components:   WBC 19.9 (*)    RBC 3.69 (*)    Hemoglobin 10.4 (*)    HCT 31.8 (*)    Neutro Abs 17.5 (*)    Monocytes Absolute 1.3 (*)    Abs Immature Granulocytes 0.18 (*)    All other components within normal limits  TROPONIN I (HIGH SENSITIVITY) - Abnormal; Notable for the following components:   Troponin I (High Sensitivity) 66 (*)    All other components within normal limits  LEGIONELLA PNEUMOPHILA SEROGP 1 UR AG  STREP PNEUMONIAE URINARY ANTIGEN  COMPREHENSIVE METABOLIC PANEL  CBC  TROPONIN I (HIGH SENSITIVITY)    EKG EKG Interpretation  Date/Time:  Monday July 07 2021 15:49:26 EDT Ventricular Rate:  87 PR Interval:  144 QRS Duration: 72 QT Interval:  352 QTC Calculation: 423 R Axis:   12 Text Interpretation: Sinus rhythm with occasional Premature ventricular complexes Cannot rule out Anterior infarct , age undetermined Abnormal ECG No previous ECGs available Confirmed by Norman Clay ([id number]) on 07/07/2021 5:31:11 PM  Radiology DG Chest 2 View  Result Date: 07/07/2021 CLINICAL DATA:  Shortness of breath and cough EXAM: CHEST - 2 VIEW COMPARISON:  10/29/2020 FINDINGS: Small left pleural effusion. Patchy consolidations in the right mid lung and left base suspicious for pneumonia. Stable cardiomediastinal silhouette. No pneumothorax. Chronic compression fracture of the lower thoracic spine. IMPRESSION: 1. Patchy airspace consolidations within the bilateral lungs suspicious for pneumonia. Small left pleural effusion.  Radiographic follow-up to resolution is recommended. Two Electronically Signed   By: Jasmine Pang M.D.   On: 07/07/2021 16:34    Procedures .Critical Care  Performed by: Olene Floss, PA-C Authorized by: Olene Floss, PA-C   Critical care provider statement:    Critical care time (minutes):  33   Critical care was necessary to treat  or prevent imminent or life-threatening deterioration of the following conditions:  Respiratory failure   Critical care was time spent personally by me on the following activities:  Development of treatment plan with patient or surrogate, discussions with consultants, evaluation of patient's response to treatment, examination of patient, ordering and review of laboratory studies, ordering and review of radiographic studies, ordering and performing treatments and interventions, pulse oximetry, re-evaluation of patient's condition and review of old charts   Care discussed with: admitting provider       Medications Ordered in ED Medications  cefTRIAXone (ROCEPHIN) 1 g in sodium chloride 0.9 % 100 mL IVPB (has no administration in time range)  acetaminophen (TYLENOL) tablet 650 mg (has no administration in time range)  azithromycin (ZITHROMAX) 250 mg in dextrose 5 % 125 mL IVPB (has no administration in time range)  rosuvastatin (CRESTOR) tablet 20 mg (has no administration in time range)  sertraline (ZOLOFT) tablet 50 mg (has no administration in time range)  enoxaparin (LOVENOX) injection 30 mg (has no administration in time range)  sodium chloride flush (NS) 0.9 % injection 3 mL (has no administration in time range)  0.9 %  sodium chloride infusion (has no administration in time range)  acetaminophen (TYLENOL) tablet 650 mg (has no administration in time range)    Or  acetaminophen (TYLENOL) suppository 650 mg (has no administration in time range)  polyethylene glycol (MIRALAX / GLYCOLAX) packet 17 g (has no administration in time range)     ED Course/ Medical Decision Making/ A&P Clinical Course as of 07/07/21 1843  Mon Jul 07, 2021  1741 Patient with signs and symptoms suggestive of pneumonia, she has minimally elevated troponin with no reported chest pain.  No history of previous ACS or coronary artery disease.  She denies nausea, vomiting but does endorse some decreased appetite. [CP]    Clinical Course User Index [CP] Anselmo Pickler, PA-C                           Medical Decision Making Risk OTC drugs.   This patient is a 81 y.o. female who presents to the ED for concern of Cough, difficulty breathing, chest congestion, this involves an extensive number of treatment options, and is a complaint that carries with it a high risk of complications and morbidity. The emergent differential diagnosis prior to evaluation includes, but is not limited to, pneumonia, ARDS, acute bronchitis, asthma, COPD.   This is not an exhaustive differential.   Past Medical History / Co-morbidities / Social History: Hypertension, hyperlipidemia, peripheral arterial disease  Additional history: Chart reviewed. Pertinent results include: Reviewed outpatient cardiology evaluations for peripheral arterial disease, patient with no recent lab work or imaging in the emergency department basis.  Physical Exam: Physical exam performed. The pertinent findings include: Patient with rhonchi in left lower lobe, no respiratory distress on 4 L nasal cannula.  She has no tenderness palpation of the chest wall, no tenderness to palpation of the abdomen.  She is minimally ill-appearing, but in no acute distress on my evaluation.  Lab Tests: I ordered, and personally interpreted labs.  The pertinent results include: Initial troponin minimally elevated at 66, we will pend a delta troponin, think likely secondary to heart strain from patient's pneumonia, patient has had no chest pain, low suspicion for acute ACS in this presentation.  She does have a  mild hyponatremia with sodium 131.  Creatinine is elevated at 1.52, however no recent baseline  on file for this patient.  Her CBC is notable for leukocytosis, white blood cells significantly elevated at 19.9.  She does have a mild anemia with hemoglobin of 10.4.  Again no recent baseline on file, however patient with no recent report of melena, hematuria, or other blood loss.  She does not take a blood thinner.   Imaging Studies: I ordered imaging studies including plain film chest x-ray. I independently visualized and interpreted imaging which showed concern for pleural effusion as well as left lower lobar pneumonia. I agree with the radiologist interpretation.   Cardiac Monitoring:  The patient was maintained on a cardiac monitor.  My attending physician Dr. Audley Hose viewed and interpreted the cardiac monitored which showed an underlying rhythm of: Normal sinus rhythm occasional PVC. I agree with this interpretation.   Medications: I ordered medication including ceftriaxone for any acquired pneumonia.  Tylenol for low-grade fever.  Patient will need reevaluation for resolution of her symptoms.  Consultations Obtained: I requested consultation with the hospitalist, spoke with Dr. Alinda Money,  and discussed lab and imaging findings as well as pertinent plan - they recommend: Admission hypoxic respiratory failure in setting of pneumonia   Emergency department workup does not suggest an emergent condition requiring admission or immediate intervention beyond what has been performed at this time. The patient is safe for discharge and has been instructed to return immediately for worsening symptoms, change in symptoms or any other concerns.  I discussed this case with my attending physician Dr. Audley Hose who cosigned this note including patient's presenting symptoms, physical exam, and planned diagnostics and interventions. Attending physician stated agreement with plan or made changes to plan which were implemented.     Final Clinical Impression(s) / ED Diagnoses Final diagnoses:  None    Rx / DC Orders ED Discharge Orders     None         [place] 07/07/21 1843    Cheryll Cockayne, MD 07/18/21 1859

## 2021-07-08 ENCOUNTER — Observation Stay (HOSPITAL_COMMUNITY): Payer: Medicare Other

## 2021-07-08 ENCOUNTER — Other Ambulatory Visit (HOSPITAL_COMMUNITY): Payer: Medicare Other

## 2021-07-08 DIAGNOSIS — I872 Venous insufficiency (chronic) (peripheral): Secondary | ICD-10-CM | POA: Diagnosis present

## 2021-07-08 DIAGNOSIS — J189 Pneumonia, unspecified organism: Secondary | ICD-10-CM | POA: Diagnosis present

## 2021-07-08 DIAGNOSIS — I4430 Unspecified atrioventricular block: Secondary | ICD-10-CM | POA: Diagnosis not present

## 2021-07-08 DIAGNOSIS — N1832 Chronic kidney disease, stage 3b: Secondary | ICD-10-CM | POA: Diagnosis present

## 2021-07-08 DIAGNOSIS — I480 Paroxysmal atrial fibrillation: Secondary | ICD-10-CM | POA: Diagnosis not present

## 2021-07-08 DIAGNOSIS — D631 Anemia in chronic kidney disease: Secondary | ICD-10-CM | POA: Diagnosis present

## 2021-07-08 DIAGNOSIS — I5032 Chronic diastolic (congestive) heart failure: Secondary | ICD-10-CM | POA: Diagnosis present

## 2021-07-08 DIAGNOSIS — Z8249 Family history of ischemic heart disease and other diseases of the circulatory system: Secondary | ICD-10-CM | POA: Diagnosis not present

## 2021-07-08 DIAGNOSIS — E669 Obesity, unspecified: Secondary | ICD-10-CM | POA: Diagnosis present

## 2021-07-08 DIAGNOSIS — Z6830 Body mass index (BMI) 30.0-30.9, adult: Secondary | ICD-10-CM | POA: Diagnosis not present

## 2021-07-08 DIAGNOSIS — Z79899 Other long term (current) drug therapy: Secondary | ICD-10-CM | POA: Diagnosis not present

## 2021-07-08 DIAGNOSIS — F32A Depression, unspecified: Secondary | ICD-10-CM | POA: Diagnosis present

## 2021-07-08 DIAGNOSIS — E782 Mixed hyperlipidemia: Secondary | ICD-10-CM | POA: Diagnosis present

## 2021-07-08 DIAGNOSIS — N179 Acute kidney failure, unspecified: Secondary | ICD-10-CM | POA: Diagnosis present

## 2021-07-08 DIAGNOSIS — F419 Anxiety disorder, unspecified: Secondary | ICD-10-CM | POA: Diagnosis present

## 2021-07-08 DIAGNOSIS — E860 Dehydration: Secondary | ICD-10-CM | POA: Diagnosis present

## 2021-07-08 DIAGNOSIS — E1122 Type 2 diabetes mellitus with diabetic chronic kidney disease: Secondary | ICD-10-CM | POA: Diagnosis present

## 2021-07-08 DIAGNOSIS — I4892 Unspecified atrial flutter: Secondary | ICD-10-CM | POA: Diagnosis not present

## 2021-07-08 DIAGNOSIS — I130 Hypertensive heart and chronic kidney disease with heart failure and stage 1 through stage 4 chronic kidney disease, or unspecified chronic kidney disease: Secondary | ICD-10-CM | POA: Diagnosis present

## 2021-07-08 DIAGNOSIS — I484 Atypical atrial flutter: Secondary | ICD-10-CM | POA: Diagnosis not present

## 2021-07-08 DIAGNOSIS — I5030 Unspecified diastolic (congestive) heart failure: Secondary | ICD-10-CM | POA: Diagnosis not present

## 2021-07-08 DIAGNOSIS — I272 Pulmonary hypertension, unspecified: Secondary | ICD-10-CM | POA: Diagnosis present

## 2021-07-08 DIAGNOSIS — J9601 Acute respiratory failure with hypoxia: Secondary | ICD-10-CM | POA: Diagnosis present

## 2021-07-08 DIAGNOSIS — R0602 Shortness of breath: Secondary | ICD-10-CM | POA: Diagnosis present

## 2021-07-08 DIAGNOSIS — I248 Other forms of acute ischemic heart disease: Secondary | ICD-10-CM | POA: Diagnosis present

## 2021-07-08 DIAGNOSIS — E876 Hypokalemia: Secondary | ICD-10-CM | POA: Diagnosis present

## 2021-07-08 DIAGNOSIS — I739 Peripheral vascular disease, unspecified: Secondary | ICD-10-CM | POA: Diagnosis present

## 2021-07-08 LAB — COMPREHENSIVE METABOLIC PANEL
ALT: 15 U/L (ref 0–44)
AST: 22 U/L (ref 15–41)
Albumin: 2.6 g/dL — ABNORMAL LOW (ref 3.5–5.0)
Alkaline Phosphatase: 99 U/L (ref 38–126)
Anion gap: 12 (ref 5–15)
BUN: 21 mg/dL (ref 8–23)
CO2: 22 mmol/L (ref 22–32)
Calcium: 8.7 mg/dL — ABNORMAL LOW (ref 8.9–10.3)
Chloride: 96 mmol/L — ABNORMAL LOW (ref 98–111)
Creatinine, Ser: 1.44 mg/dL — ABNORMAL HIGH (ref 0.44–1.00)
GFR, Estimated: 37 mL/min — ABNORMAL LOW (ref >60)
Glucose, Bld: 102 mg/dL — ABNORMAL HIGH (ref 70–99)
Potassium: 3.9 mmol/L (ref 3.5–5.1)
Sodium: 130 mmol/L — ABNORMAL LOW (ref 135–145)
Total Bilirubin: 0.5 mg/dL (ref 0.3–1.2)
Total Protein: 6.2 g/dL — ABNORMAL LOW (ref 6.5–8.1)

## 2021-07-08 LAB — URIC ACID: Uric Acid, Serum: 5.3 mg/dL (ref 2.5–7.1)

## 2021-07-08 LAB — URINALYSIS, ROUTINE W REFLEX MICROSCOPIC
Bilirubin Urine: NEGATIVE
Glucose, UA: NEGATIVE mg/dL
Ketones, ur: 5 mg/dL — AB
Nitrite: NEGATIVE
Protein, ur: 30 mg/dL — AB
Specific Gravity, Urine: 1.01 (ref 1.005–1.030)
WBC, UA: >50 WBC/hpf — ABNORMAL HIGH (ref 0–5)
pH: 5 (ref 5.0–8.0)

## 2021-07-08 LAB — CBC
HCT: 29.3 % — ABNORMAL LOW (ref 36.0–46.0)
Hemoglobin: 9.5 g/dL — ABNORMAL LOW (ref 12.0–15.0)
MCH: 28.1 pg (ref 26.0–34.0)
MCHC: 32.4 g/dL (ref 30.0–36.0)
MCV: 86.7 fL (ref 80.0–100.0)
Platelets: 263 10*3/uL (ref 150–400)
RBC: 3.38 MIL/uL — ABNORMAL LOW (ref 3.87–5.11)
RDW: 13.1 % (ref 11.5–15.5)
WBC: 19.6 10*3/uL — ABNORMAL HIGH (ref 4.0–10.5)
nRBC: 0 % (ref 0.0–0.2)

## 2021-07-08 LAB — BRAIN NATRIURETIC PEPTIDE: B Natriuretic Peptide: 603 pg/mL — ABNORMAL HIGH (ref 0.0–100.0)

## 2021-07-08 LAB — STREP PNEUMONIAE URINARY ANTIGEN: Strep Pneumo Urinary Antigen: NEGATIVE

## 2021-07-08 LAB — SODIUM, URINE, RANDOM: Sodium, Ur: 33 mmol/L

## 2021-07-08 LAB — OSMOLALITY: Osmolality: 278 mOsm/kg (ref 275–295)

## 2021-07-08 LAB — OSMOLALITY, URINE: Osmolality, Ur: 318 mOsm/kg (ref 300–900)

## 2021-07-08 LAB — PROCALCITONIN: Procalcitonin: 0.67 ng/mL

## 2021-07-08 LAB — CREATININE, URINE, RANDOM: Creatinine, Urine: 69.63 mg/dL

## 2021-07-08 MED ORDER — ASPIRIN 81 MG PO CHEW
81.0000 mg | CHEWABLE_TABLET | Freq: Every day | ORAL | Status: DC
  Administered 2021-07-08 – 2021-07-11 (×4): 81 mg via ORAL
  Filled 2021-07-08 (×4): qty 1

## 2021-07-08 MED ORDER — SODIUM CHLORIDE 0.9 % IV SOLN: INTRAVENOUS | Status: DC

## 2021-07-08 MED ORDER — PANTOPRAZOLE SODIUM 40 MG PO TBEC
40.0000 mg | DELAYED_RELEASE_TABLET | Freq: Every day | ORAL | Status: DC
  Administered 2021-07-08 – 2021-07-12 (×5): 40 mg via ORAL
  Filled 2021-07-08 (×5): qty 1

## 2021-07-08 NOTE — Evaluation (Signed)
Physical Therapy Evaluation Patient Details Name: Brandi [name] MRN: [id number] DOB: [birth year] Today's Date: 07/08/2021  History of Present Illness  81 y.o. female with medical history significant of depression, anxiety, hyperlipidemia, hypertension, venous insufficiency presenting with shortness of breath.  She was diagnosed with pneumonia and admitted to the hospital.  Clinical Impression  Patient received in bed, she is agreeable to PT assessment. She is mod independent with bed mobility, transfers with supervision and ambulated 25 feet with RW and supervision. O2 sats down to 88% on 3 liters with ambulation. Returned patient to 4 liters upon returning to recliner. She will continue to benefit from skilled PT while here to improve strength, activity tolerance and safety.        Recommendations for follow up therapy are one component of a multi-disciplinary discharge planning process, led by the attending physician.  Recommendations may be updated based on patient status, additional functional criteria and insurance authorization.  Follow Up Recommendations Home health PT    Assistance Recommended at Discharge Intermittent Supervision/Assistance  Patient can return home with the following  A little help with walking and/or transfers;Assistance with cooking/housework;Assist for transportation;Help with stairs or ramp for entrance    Equipment Recommendations None recommended by PT  Recommendations for Other Services       Functional Status Assessment Patient has had a recent decline in their functional status and demonstrates the ability to make significant improvements in function in a reasonable and predictable amount of time.     Precautions / Restrictions Precautions Precaution Comments: mod fall Restrictions Weight Bearing Restrictions: No      Mobility  Bed Mobility Overal bed mobility: Modified Independent             General bed mobility comments: increased time and  effort    Transfers Overall transfer level: Needs assistance Equipment used: Rolling walker (2 wheels) Transfers: Sit to/from Stand Sit to Stand: Supervision                Ambulation/Gait Ambulation/Gait assistance: Supervision Gait Distance (Feet): 25 Feet Assistive device: Rolling walker (2 wheels) Gait Pattern/deviations: Step-through pattern, Decreased step length - right, Decreased step length - left Gait velocity: decr     General Gait Details: O2 sats down to 88% on 3 liters ambulating.  Stairs            Wheelchair Mobility    Modified Rankin (Stroke Patients Only)       Balance Overall balance assessment: Needs assistance Sitting-balance support: Feet supported Sitting balance-Leahy Scale: Good     Standing balance support: Bilateral upper extremity supported, During functional activity, Reliant on assistive device for balance Standing balance-Leahy Scale: Fair                               Pertinent Vitals/Pain Pain Assessment Pain Assessment: No/denies pain    Home Living Family/patient expects to be discharged to:: Private residence Living Arrangements: Spouse/significant other Available Help at Discharge: Family;Available 24 hours/day Type of Home: House Home Access: Stairs to enter Entrance Stairs-Rails: Right Entrance Stairs-Number of Steps: 3-4   Home Layout: Multi-level;Able to live on main level with bedroom/bathroom Home Equipment: Cane - single point      Prior Function Prior Level of Function : Independent/Modified Independent;Driving             Mobility Comments: uses cane occasionally, especially on steps ADLs Comments: independent     Hand Dominance  Extremity/Trunk Assessment   Upper Extremity Assessment Upper Extremity Assessment: Overall WFL for tasks assessed    Lower Extremity Assessment Lower Extremity Assessment: Generalized weakness    Cervical / Trunk Assessment Cervical  / Trunk Assessment: Normal  Communication   Communication: No difficulties  Cognition Arousal/Alertness: Awake/alert Behavior During Therapy: WFL for tasks assessed/performed Overall Cognitive Status: Within Functional Limits for tasks assessed                                          General Comments      Exercises     Assessment/Plan    PT Assessment Patient needs continued PT services  PT Problem List Decreased strength;Decreased mobility;Decreased activity tolerance;Decreased knowledge of use of DME;Cardiopulmonary status limiting activity       PT Treatment Interventions DME instruction;Gait training;Stair training;Functional mobility training;Therapeutic activities;Patient/family education;Therapeutic exercise    PT Goals (Current goals can be found in the Care Plan section)  Acute Rehab PT Goals Patient Stated Goal: to feel better, go home PT Goal Formulation: With patient Time For Goal Achievement: 07/18/21 Potential to Achieve Goals: Good    Frequency Min 3X/week     Co-evaluation               AM-PAC PT "6 Clicks" Mobility  Outcome Measure Help needed turning from your back to your side while in a flat bed without using bedrails?: None Help needed moving from lying on your back to sitting on the side of a flat bed without using bedrails?: A Little Help needed moving to and from a bed to a chair (including a wheelchair)?: A Little Help needed standing up from a chair using your arms (e.g., wheelchair or bedside chair)?: A Little Help needed to walk in hospital room?: A Little Help needed climbing 3-5 steps with a railing? : A Little 6 Click Score: 19    End of Session Equipment Utilized During Treatment: Gait belt;Oxygen Activity Tolerance: Patient limited by fatigue Patient left: in chair;with call bell/phone within reach Nurse Communication: Mobility status PT Visit Diagnosis: Muscle weakness (generalized) (M62.81);Difficulty in  walking, not elsewhere classified (R26.2)    Time: 1340-1400 PT Time Calculation (min) (ACUTE ONLY): 20 min   Charges:   PT Evaluation $PT Eval Moderate Complexity: 1 Mod          Sharad Vaneaton, PT, GCS 07/08/21,2:08 PM

## 2021-07-08 NOTE — Progress Notes (Signed)
PT Cancellation Note  Patient Details Name: Brandi [name] MRN: [id number] DOB: 02-May-1940   Cancelled Treatment:    Reason Eval/Treat Not Completed: Patient declined, no reason specified;Fatigue/lethargy limiting ability to participate. Will re-attempt this pm as time allows.    Loreena Valeri 07/08/2021, 11:30 AM

## 2021-07-08 NOTE — Progress Notes (Signed)
PROGRESS NOTE                                                                                                                                                                                                             Patient Demographics:    Brandi [name], is a 81 y.o. female, DOB - 11-22-1940, XBD:[id number]  Outpatient Primary MD for the patient is Brandi Bender, MD    LOS - 0  Admit date - 07/07/2021    Chief Complaint  Patient presents with   Shortness of Breath       Brief Narrative (HPI from H&P)     81 y.o. female with medical history significant of depression, anxiety, hyperlipidemia, hypertension, venous insufficiency presenting with shortness of breath.  She was diagnosed with pneumonia and admitted to the hospital.   Subjective:    Brandi [name] today has, No headache, No chest pain, No abdominal pain - No Nausea, No new weakness tingling or numbness, ++ cough and mild SOB.   Assessment  & Plan :    Acute respiratory failure with hypoxia due to CAP -  presenting with 3 days of shortness of breath with associated cough.  Have leukocytosis along with mildly elevated procalcitonin, placed on appropriate antibiotics and IV fluids, encouraged to sit up in chair use I-S and flutter valve for pulmonary toiletry.  Monitor with supportive care, titrate down oxygen and increase activity.  AKI  -  due to dehydration, will check urine electrolytes, hydrate and monitor.   Troponin elevation  > Mildly elevated troponin in the ED likely setting of heart strain secondary to pneumonia. Trend is flat and in ACS pattern, placed on baby aspirin check echocardiogram to evaluate EF and wall motion.  D on beta-blocker which will be continued along with statin.  Chronic anemia.  On oral iron supplementation continue to monitor with IV fluids expect some fall with heme dilution.  We will also check anemia panel  Hyperlipidemia - Continue  rosuvastatin  Hypertension  - Continue Coreg, felodipine, Hold home irbesartan in the setting of suspected AKI .  Depression - Continue home sertraline   Obesity  -BMI of 30 follow with PCP for weight loss         Condition - Extremely Guarded  Family Communication  :  none present  Code Status :  Full  Consults  :  None  PUD Prophylaxis :    Procedures  :           Disposition Plan  :    Status is: Observation   DVT Prophylaxis  :    enoxaparin (LOVENOX) injection 30 mg Start: 07/07/21 1900    Lab Results  Component Value Date   PLT 263 07/08/2021    Diet :  Diet Order             Diet regular Room service appropriate? Yes; Fluid consistency: Thin  Diet effective now                    Inpatient Medications  Scheduled Meds:  carvedilol  12.5 mg Oral BID   enoxaparin (LOVENOX) injection  30 mg Subcutaneous Q24H   felodipine  5 mg Oral BID   [START ON 07/09/2021] rosuvastatin  20 mg Oral Once per day on Wed Sat   sertraline  50 mg Oral Daily   sodium chloride flush  3 mL Intravenous Q12H   Continuous Infusions:  sodium chloride 50 mL/hr at 07/08/21 0645   azithromycin Stopped (07/07/21 2041)   cefTRIAXone (ROCEPHIN)  IV Stopped (07/07/21 1927)   PRN Meds:.acetaminophen **OR** acetaminophen, guaiFENesin, polyethylene glycol  Time Spent in minutes  30   Brandi [name] M.D on 07/08/2021 at 11:13 AM  To page go to [email or web address]   [facility] -  Office  [phone]  See all Orders from today for further details    Objective:   Vitals:   07/07/21 2215 07/07/21 2358 07/08/21 0400 07/08/21 0800  BP: (!) 142/63 125/61 (!) 138/59 (!) 147/62  Pulse: 82 82 87 98  Resp: (!) 21 17 20 20   Temp:  98.5 F (36.9 C) 98.4 F (36.9 C) 99.6 F (37.6 C)  TempSrc:  Oral Oral Oral  SpO2: 96% 95% 95% 94%  Weight:      Height:        Wt Readings from Last 3 Encounters:  07/07/21 65.3 kg  03/24/21 65.3 kg  12/22/18 77.1 kg      Intake/Output Summary (Last 24 hours) at 07/08/2021 1113 Last data filed at 07/08/2021 0600 Gross per 24 hour  Intake 1237.02 ml  Output --  Net 1237.02 ml     Physical Exam  Awake Alert, No new F.N deficits, Normal affect [place].AT,PERRAL Supple Neck, No JVD,   Symmetrical Chest wall movement, Good air movement bilaterally, CTAB RRR,No Gallops,Rubs or new Murmurs,  +ve B.Sounds, Abd Soft, No tenderness,   No Cyanosis, Clubbing or edema      Data Review:    CBC Recent Labs  Lab 07/07/21 1543 07/08/21 0324  WBC 19.9* 19.6*  HGB 10.4* 9.5*  HCT 31.8* 29.3*  PLT 277 263  MCV 86.2 86.7  MCH 28.2 28.1  MCHC 32.7 32.4  RDW 13.0 13.1  LYMPHSABS 0.9  --   MONOABS 1.3*  --   EOSABS 0.0  --   BASOSABS 0.0  --     Electrolytes Recent Labs  Lab 07/07/21 1543 07/08/21 0324 07/08/21 0334  NA 131* 130*  --   K 4.0 3.9  --   CL 94* 96*  --   CO2 22 22  --   GLUCOSE 114* 102*  --   BUN 17 21  --   CREATININE 1.52* 1.44*  --   CALCIUM 9.2 8.7*  --   AST  --  22  --   ALT  --  15  --   ALKPHOS  --  99  --   BILITOT  --  0.5  --   ALBUMIN  --  2.6*  --   PROCALCITON  --   --  0.67  BNP  --  603.0*  --      Micro Results No results found for this or any previous visit (from the past 240 hour(s)).  Radiology Reports DG Chest Port 1 View  Result Date: 07/08/2021 CLINICAL DATA:  Shortness of breath. EXAM: PORTABLE CHEST 1 VIEW COMPARISON:  07/07/2021 FINDINGS: 0611 hours. The cardio pericardial silhouette is enlarged. Persistent left base collapse/consolidation with probable tiny left pleural effusion. Interval increase in right parahilar collapse/consolidative opacity. Bones are diffusely demineralized. Telemetry leads overlie the chest. IMPRESSION: 1. Interval increase in right parahilar collapse/consolidative opacity. 2. Persistent left base collapse/consolidation with probable tiny left pleural effusion. Electronically Signed   By: Kennith Center M.D.   On:  07/08/2021 07:06   DG Chest 2 View  Result Date: 07/07/2021 CLINICAL DATA:  Shortness of breath and cough EXAM: CHEST - 2 VIEW COMPARISON:  10/29/2020 FINDINGS: Small left pleural effusion. Patchy consolidations in the right mid lung and left base suspicious for pneumonia. Stable cardiomediastinal silhouette. No pneumothorax. Chronic compression fracture of the lower thoracic spine. IMPRESSION: 1. Patchy airspace consolidations within the bilateral lungs suspicious for pneumonia. Small left pleural effusion. Radiographic follow-up to resolution is recommended. Two Electronically Signed   By: Jasmine Pang M.D.   On: 07/07/2021 16:34

## 2021-07-09 ENCOUNTER — Inpatient Hospital Stay (HOSPITAL_COMMUNITY): Payer: Medicare Other

## 2021-07-09 DIAGNOSIS — I5030 Unspecified diastolic (congestive) heart failure: Secondary | ICD-10-CM | POA: Diagnosis not present

## 2021-07-09 DIAGNOSIS — J9601 Acute respiratory failure with hypoxia: Secondary | ICD-10-CM | POA: Diagnosis not present

## 2021-07-09 LAB — COMPREHENSIVE METABOLIC PANEL
ALT: 15 U/L (ref 0–44)
AST: 18 U/L (ref 15–41)
Albumin: 2.3 g/dL — ABNORMAL LOW (ref 3.5–5.0)
Alkaline Phosphatase: 100 U/L (ref 38–126)
Anion gap: 10 (ref 5–15)
BUN: 21 mg/dL (ref 8–23)
CO2: 23 mmol/L (ref 22–32)
Calcium: 8.5 mg/dL — ABNORMAL LOW (ref 8.9–10.3)
Chloride: 103 mmol/L (ref 98–111)
Creatinine, Ser: 1.46 mg/dL — ABNORMAL HIGH (ref 0.44–1.00)
GFR, Estimated: 36 mL/min — ABNORMAL LOW (ref >60)
Glucose, Bld: 114 mg/dL — ABNORMAL HIGH (ref 70–99)
Potassium: 3.5 mmol/L (ref 3.5–5.1)
Sodium: 136 mmol/L (ref 135–145)
Total Bilirubin: 0.6 mg/dL (ref 0.3–1.2)
Total Protein: 5.8 g/dL — ABNORMAL LOW (ref 6.5–8.1)

## 2021-07-09 LAB — CBC WITH DIFFERENTIAL/PLATELET
Abs Immature Granulocytes: 0.19 10*3/uL — ABNORMAL HIGH (ref 0.00–0.07)
Basophils Absolute: 0 10*3/uL (ref 0.0–0.1)
Basophils Relative: 0 %
Eosinophils Absolute: 0 10*3/uL (ref 0.0–0.5)
Eosinophils Relative: 0 %
HCT: 27.8 % — ABNORMAL LOW (ref 36.0–46.0)
Hemoglobin: 8.8 g/dL — ABNORMAL LOW (ref 12.0–15.0)
Immature Granulocytes: 1 %
Lymphocytes Relative: 4 %
Lymphs Abs: 0.7 10*3/uL (ref 0.7–4.0)
MCH: 27.6 pg (ref 26.0–34.0)
MCHC: 31.7 g/dL (ref 30.0–36.0)
MCV: 87.1 fL (ref 80.0–100.0)
Monocytes Absolute: 1.3 10*3/uL — ABNORMAL HIGH (ref 0.1–1.0)
Monocytes Relative: 7 %
Neutro Abs: 16.5 10*3/uL — ABNORMAL HIGH (ref 1.7–7.7)
Neutrophils Relative %: 88 %
Platelets: 276 10*3/uL (ref 150–400)
RBC: 3.19 MIL/uL — ABNORMAL LOW (ref 3.87–5.11)
RDW: 13.2 % (ref 11.5–15.5)
WBC: 18.7 10*3/uL — ABNORMAL HIGH (ref 4.0–10.5)
nRBC: 0 % (ref 0.0–0.2)

## 2021-07-09 LAB — IRON AND TIBC
Iron: 11 ug/dL — ABNORMAL LOW (ref 28–170)
Saturation Ratios: 7 % — ABNORMAL LOW (ref 10.4–31.8)
TIBC: 160 ug/dL — ABNORMAL LOW (ref 250–450)
UIBC: 149 ug/dL

## 2021-07-09 LAB — BRAIN NATRIURETIC PEPTIDE: B Natriuretic Peptide: 680.6 pg/mL — ABNORMAL HIGH (ref 0.0–100.0)

## 2021-07-09 LAB — PROCALCITONIN: Procalcitonin: 0.55 ng/mL

## 2021-07-09 LAB — RETICULOCYTES
Immature Retic Fract: 15.9 % (ref 2.3–15.9)
RBC.: 3.21 MIL/uL — ABNORMAL LOW (ref 3.87–5.11)
Retic Count, Absolute: 28.2 10*3/uL (ref 19.0–186.0)
Retic Ct Pct: 0.9 % (ref 0.4–3.1)

## 2021-07-09 LAB — VITAMIN B12: Vitamin B-12: 2523 pg/mL — ABNORMAL HIGH (ref 180–914)

## 2021-07-09 LAB — ECHOCARDIOGRAM COMPLETE
AR max vel: 2.57 cm2
AV Peak grad: 12.6 mmHg
Ao pk vel: 1.78 m/s
Area-P 1/2: 4.06 cm2
Height: 58 in
S' Lateral: 3.2 cm
Weight: 2304 oz

## 2021-07-09 LAB — MAGNESIUM: Magnesium: 1.9 mg/dL (ref 1.7–2.4)

## 2021-07-09 LAB — FERRITIN: Ferritin: 285 ng/mL (ref 11–307)

## 2021-07-09 LAB — FOLATE: Folate: 29.6 ng/mL (ref >5.9)

## 2021-07-09 MED ORDER — FUROSEMIDE 40 MG PO TABS
40.0000 mg | ORAL_TABLET | Freq: Once | ORAL | Status: AC
  Administered 2021-07-09: 40 mg via ORAL
  Filled 2021-07-09: qty 1

## 2021-07-09 NOTE — TOC Initial Note (Signed)
Transition of Care [facility]) - Initial/Assessment Note    Patient Details  Name: Brandi [name] MRN: [id number] Date of Birth: 03-Feb-1940  Transition of Care [facility]) CM/SW Contact:    Brandi Sabal, RN Phone Number: 07/09/2021, 1:30 PM  Clinical Narrative:        Brandi [name] w patient at bedside. She states that at home she has the help of her spouse as well as her children who live close by and will be assisting when she goes home. We discussed options with home health services and at this time she has declined them. She states that she has a cane at home and does not want additional DME. TOC will continue  to follow            Expected Discharge Plan: Home/Self Care Barriers to Discharge: Continued Medical Work up   Patient Goals and CMS Choice Patient states their goals for this hospitalization and ongoing recovery are:: to go home      Expected Discharge Plan and Services Expected Discharge Plan: Home/Self Care   Discharge Planning Services: CM Consult   Living arrangements for the past 2 months: Single Family Home                                      Prior Living Arrangements/Services Living arrangements for the past 2 months: Single Family Home Lives with:: Spouse              Current home services: DME (cane)    Activities of Daily Living Home Assistive Devices/Equipment: Cane (specify quad or straight) ADL Screening (condition at time of admission) Patient's cognitive ability adequate to safely complete daily activities?: Yes Is the patient deaf or have difficulty hearing?: No Does the patient have difficulty seeing, even when wearing glasses/contacts?: No Does the patient have difficulty concentrating, remembering, or making decisions?: No Patient able to express need for assistance with ADLs?: Yes Does the patient have difficulty dressing or bathing?: No Independently performs ADLs?: Yes (appropriate for developmental age) Does the patient have difficulty  walking or climbing stairs?: Yes Weakness of Legs: Both Weakness of Arms/Hands: None  Permission Sought/Granted                  Emotional Assessment              Admission diagnosis:  CAP (community acquired pneumonia) [J18.9] Patient Active Problem List   Diagnosis Date Noted   CAP (community acquired pneumonia) 07/07/2021   Acute respiratory failure with hypoxia (HCC) 07/07/2021   AKI (acute kidney injury) (HCC) 07/07/2021   Troponin level elevated 07/07/2021   Vasovagal episode 06/08/2017   Mixed hyperlipidemia 06/08/2017   Chest pain with moderate risk for cardiac etiology 06/07/2017   Abnormal EKG 06/07/2017   Depression 03/18/2012   Mild obesity 03/18/2012   PCP:  Brandi Bender, MD Pharmacy:   [facility] DELIV[facility] - [address] [place] - [address] [place] [address] Phone: [phone] Fax: [phone]  [facility] [address] [facility] [place], [place] - [address] [place] [address] Phone: [phone] Fax: [phone]  [facility] [address] [place], [place] - [address] [place] [address] Phone: [phone] Fax: [phone]  [id number] [place], [place] - [address] [place] [address] Phone: [phone] Fax:  [phone]     Social Determinants of Health (SDOH) Interventions    Readmission Risk Interventions     No data to display

## 2021-07-09 NOTE — Progress Notes (Signed)
PROGRESS NOTE                                                                                                                                                                                                             Patient Demographics:    Brandi [name], is a 81 y.o. female, DOB - 01/10/1941, ZOX:[id number]  Outpatient Primary MD for the patient is Gwenyth Bender, MD    LOS - 1  Admit date - 07/07/2021    Chief Complaint  Patient presents with   Shortness of Breath       Brief Narrative (HPI from H&P)     81 y.o. female with medical history significant of depression, anxiety, hyperlipidemia, hypertension, venous insufficiency presenting with shortness of breath.  She was diagnosed with pneumonia and admitted to the hospital.   Subjective:   Patient in bed, appears comfortable, denies any headache, no fever, no chest pain or pressure, improved shortness of breath , no abdominal pain. No new focal weakness.   Assessment  & Plan :    Acute respiratory failure with hypoxia due to CAP -  presenting with 3 days of shortness of breath with associated cough.  Have leukocytosis along with mildly elevated procalcitonin, placed on appropriate antibiotics and IV fluids, encouraged to sit up in chair use I-S and flutter valve for pulmonary toiletry.  Monitor with supportive care, titrate down oxygen and increase activity.  Clinically improving.  AKI on CKD 3B-  due to dehydration, will check urine electrolytes, hydrate and monitor.  Last creatinin checked in PCP office on June 28, 2021 was 1.4.   Troponin elevation  > Mildly elevated troponin in the ED likely setting of heart strain secondary to pneumonia. Trend is flat and in ACS pattern, placed on baby aspirin, pending echocardiogram to evaluate EF and wall motion.  Now on beta-blocker which will be continued along with statin.  Chronic anemia.  On oral iron supplementation continue  to monitor with IV fluids expect some fall with heme dilution.  We will also check anemia panel  Hyperlipidemia - Continue rosuvastatin  Hypertension  - Continue Coreg, felodipine, Hold home irbesartan in the setting of suspected AKI .  Depression - Continue home sertraline   Obesity  -BMI of 30 follow with PCP for weight loss       Condition -  Extremely Guarded  Family Communication  :  none present  Code Status :  Full  Consults  :  None  PUD Prophylaxis :    Procedures  :     TTE      Disposition Plan  :    Status is: Inpt   DVT Prophylaxis  :    enoxaparin (LOVENOX) injection 30 mg Start: 07/07/21 1900    Lab Results  Component Value Date   PLT 276 07/09/2021    Diet :  Diet Order             Diet regular Room service appropriate? Yes; Fluid consistency: Thin  Diet effective now                    Inpatient Medications  Scheduled Meds:  aspirin  81 mg Oral Daily   carvedilol  12.5 mg Oral BID   enoxaparin (LOVENOX) injection  30 mg Subcutaneous Q24H   felodipine  5 mg Oral BID   pantoprazole  40 mg Oral Daily   rosuvastatin  20 mg Oral Once per day on Wed Sat   sertraline  50 mg Oral Daily   sodium chloride flush  3 mL Intravenous Q12H   Continuous Infusions:  azithromycin Stopped (07/08/21 2350)   cefTRIAXone (ROCEPHIN)  IV Stopped (07/08/21 1735)   PRN Meds:.acetaminophen **OR** acetaminophen, guaiFENesin, polyethylene glycol  Time Spent in minutes  30   Susa Raring M.D on 07/09/2021 at 9:48 AM  To page go to [email or web address]   [facility] -  Office  [phone]  See all Orders from today for further details    Objective:   Vitals:   07/08/21 2000 07/09/21 0000 07/09/21 0316 07/09/21 0824  BP: (!) 132/57 (!) 113/54 (!) 132/52 (!) 137/57  Pulse: 92 92 83 88  Resp: 20 19 18 18   Temp: 99 F (37.2 C) 98.9 F (37.2 C) 98.6 F (37 C) 98.3 F (36.8 C)  TempSrc: Oral Oral Oral Oral  SpO2: 92% 92% 92% 92%   Weight:      Height:        Wt Readings from Last 3 Encounters:  07/07/21 65.3 kg  03/24/21 65.3 kg  12/22/18 77.1 kg     Intake/Output Summary (Last 24 hours) at 07/09/2021 0948 Last data filed at 07/09/2021 07/11/2021 Gross per 24 hour  Intake 1276.28 ml  Output --  Net 1276.28 ml     Physical Exam  Awake Alert, No new F.N deficits, Normal affect [place].AT,PERRAL Supple Neck, No JVD,   Symmetrical Chest wall movement, Good air movement bilaterally, CTAB RRR,No Gallops,Rubs or new Murmurs,  +ve B.Sounds, Abd Soft, No tenderness,   No Cyanosis, Clubbing or edema      Data Review:    CBC Recent Labs  Lab 07/07/21 1543 07/08/21 0324 07/09/21 0625  WBC 19.9* 19.6* 18.7*  HGB 10.4* 9.5* 8.8*  HCT 31.8* 29.3* 27.8*  PLT 277 263 276  MCV 86.2 86.7 87.1  MCH 28.2 28.1 27.6  MCHC 32.7 32.4 31.7  RDW 13.0 13.1 13.2  LYMPHSABS 0.9  --  0.7  MONOABS 1.3*  --  1.3*  EOSABS 0.0  --  0.0  BASOSABS 0.0  --  0.0    Electrolytes Recent Labs  Lab 07/07/21 1543 07/08/21 0324 07/08/21 0334 07/09/21 0625  NA 131* 130*  --  136  K 4.0 3.9  --  3.5  CL 94* 96*  --  103  CO2 22 22  --  23  GLUCOSE 114* 102*  --  114*  BUN 17 21  --  21  CREATININE 1.52* 1.44*  --  1.46*  CALCIUM 9.2 8.7*  --  8.5*  AST  --  22  --  18  ALT  --  15  --  15  ALKPHOS  --  99  --  100  BILITOT  --  0.5  --  0.6  ALBUMIN  --  2.6*  --  2.3*  MG  --   --   --  1.9  PROCALCITON  --   --  0.67 0.55  BNP  --  603.0*  --  680.6*     Micro Results No results found for this or any previous visit (from the past 240 hour(s)).  Radiology Reports DG Chest Port 1 View  Result Date: 07/09/2021 CLINICAL DATA:  Provided history: Shortness of breath. EXAM: PORTABLE CHEST 1 VIEW COMPARISON:  Prior chest radiograph 07/08/2021 and earlier. FINDINGS: Cardiomegaly. Persistent ill-defined opacity within the left mid to lower lung field, overall similar to the prior chest radiograph of 07/08/2021. Unchanged  opacity within the perihilar right lung. No definite pleural effusion or evidence of pneumothorax. IMPRESSION: Persistent ill-defined opacity within the left mid to lower lung field, overall similar to the prior chest radiograph of 07/08/2021. This may reflect atelectasis and/or airspace consolidation. Unchanged opacity within the perihilar right lung, which may reflect atelectasis and/or airspace consolidation. Cardiomegaly. Electronically Signed   By: Kellie Simmering D.O.   On: 07/09/2021 07:03   DG Chest Port 1 View  Result Date: 07/08/2021 CLINICAL DATA:  Shortness of breath. EXAM: PORTABLE CHEST 1 VIEW COMPARISON:  07/07/2021 FINDINGS: 0611 hours. The cardio pericardial silhouette is enlarged. Persistent left base collapse/consolidation with probable tiny left pleural effusion. Interval increase in right parahilar collapse/consolidative opacity. Bones are diffusely demineralized. Telemetry leads overlie the chest. IMPRESSION: 1. Interval increase in right parahilar collapse/consolidative opacity. 2. Persistent left base collapse/consolidation with probable tiny left pleural effusion. Electronically Signed   By: Misty Stanley M.D.   On: 07/08/2021 07:06   DG Chest 2 View  Result Date: 07/07/2021 CLINICAL DATA:  Shortness of breath and cough EXAM: CHEST - 2 VIEW COMPARISON:  10/29/2020 FINDINGS: Small left pleural effusion. Patchy consolidations in the right mid lung and left base suspicious for pneumonia. Stable cardiomediastinal silhouette. No pneumothorax. Chronic compression fracture of the lower thoracic spine. IMPRESSION: 1. Patchy airspace consolidations within the bilateral lungs suspicious for pneumonia. Small left pleural effusion. Radiographic follow-up to resolution is recommended. Two Electronically Signed   By: Donavan Foil M.D.   On: 07/07/2021 16:34

## 2021-07-09 NOTE — Evaluation (Signed)
Occupational Therapy Evaluation Patient Details Name: Brandi [name] MRN: [id number] DOB: Jul 24, 1940 Today's Date: 07/09/2021   History of Present Illness 81 y.o. female with medical history significant of depression, anxiety, hyperlipidemia, hypertension, venous insufficiency presenting with shortness of breath.  She was diagnosed with pneumonia and admitted to the hospital.   Clinical Impression   Pt with fatigue, requiring encouragement to participate in activities necessary for OT eval. Declined remaining up in chair, educated in benefits and use of IS and flutter valve. Pt currently requires supervision for OOB mobility with RW and set up to supervision for ADLs. Pt likely to progress well and may not require post acute OT. Pt on 2L 02 with Sp02 94%.      Recommendations for follow up therapy are one component of a multi-disciplinary discharge planning process, led by the attending physician.  Recommendations may be updated based on patient status, additional functional criteria and insurance authorization.   Follow Up Recommendations  Home health OT    Assistance Recommended at Discharge Frequent or constant Supervision/Assistance  Patient can return home with the following A little help with walking and/or transfers;A little help with bathing/dressing/bathroom;Assistance with cooking/housework;Assist for transportation;Help with stairs or ramp for entrance    Functional Status Assessment  Patient has had a recent decline in their functional status and demonstrates the ability to make significant improvements in function in a reasonable and predictable amount of time.  Equipment Recommendations   (TBD)    Recommendations for Other Services       Precautions / Restrictions Precautions Precautions: Other (comment);Fall (watch 02) Restrictions Weight Bearing Restrictions: No      Mobility Bed Mobility Overal bed mobility: Modified Independent             General bed  mobility comments: HOB up    Transfers Overall transfer level: Needs assistance Equipment used: Rolling walker (2 wheels) Transfers: Sit to/from Stand Sit to Stand: Supervision                  Balance Overall balance assessment: Needs assistance Sitting-balance support: Feet supported Sitting balance-Leahy Scale: Good     Standing balance support: During functional activity, No upper extremity supported Standing balance-Leahy Scale: Fair                             ADL either performed or assessed with clinical judgement   ADL Overall ADL's : Needs assistance/impaired Eating/Feeding: Independent   Grooming: Wash/dry hands;Standing;Supervision/safety   Upper Body Bathing: Set up;Sitting   Lower Body Bathing: Supervison/ safety;Sit to/from stand   Upper Body Dressing : Set up;Sitting   Lower Body Dressing: Supervision/safety;Sit to/from stand   Toilet Transfer: Supervision/safety;Ambulation;Rolling walker (2 wheels)   Toileting- Clothing Manipulation and Hygiene: Supervision/safety;Sit to/from stand       Functional mobility during ADLs: Supervision/safety;Rolling walker (2 wheels)       Vision Baseline Vision/History: 1 Wears glasses Ability to See in Adequate Light: 0 Adequate Patient Visual Report: No change from baseline       Perception     Praxis      Pertinent Vitals/Pain Pain Assessment Pain Assessment: No/denies pain     Hand Dominance Right   Extremity/Trunk Assessment Upper Extremity Assessment Upper Extremity Assessment: Overall WFL for tasks assessed   Lower Extremity Assessment Lower Extremity Assessment: Defer to PT evaluation   Cervical / Trunk Assessment Cervical / Trunk Assessment: Normal   Communication Communication Communication: No difficulties  Cognition Arousal/Alertness: Awake/alert Behavior During Therapy: WFL for tasks assessed/performed Overall Cognitive Status: Within Functional Limits for  tasks assessed                                       General Comments       Exercises     Shoulder Instructions      Home Living Family/patient expects to be discharged to:: Private residence Living Arrangements: Spouse/significant other Available Help at Discharge: Family;Available 24 hours/day Type of Home: House Home Access: Stairs to enter [employer] of Steps: 3-4 Entrance Stairs-Rails: Right Home Layout: Multi-level;Able to live on main level with bedroom/bathroom               Home Equipment: Cane - single point          Prior Functioning/Environment Prior Level of Function : Independent/Modified Independent;Driving             Mobility Comments: uses cane occasionally, especially on steps ADLs Comments: independent        OT Problem List: Decreased activity tolerance;Impaired balance (sitting and/or standing);Cardiopulmonary status limiting activity      OT Treatment/Interventions: Self-care/ADL training;Energy conservation;DME and/or AE instruction;Patient/family education;Balance training;Therapeutic activities    OT Goals(Current goals can be found in the care plan section) Acute Rehab OT Goals OT Goal Formulation: With patient Time For Goal Achievement: 07/23/21 Potential to Achieve Goals: Good ADL Goals Pt Will Perform Grooming: with modified independence;standing Pt Will Perform Lower Body Bathing: with modified independence;sit to/from stand Pt Will Perform Lower Body Dressing: with modified independence;sit to/from stand Pt Will Transfer to Toilet: with modified independence;ambulating;regular height toilet Pt Will Perform Toileting - Clothing Manipulation and hygiene: with modified independence;sit to/from stand Additional ADL Goal #1: Pt will state at least 3 energy conservation strategies as instructed.  OT Frequency: Min 2X/week    Co-evaluation              AM-PAC OT "6 Clicks" Daily Activity      Outcome Measure Help from another person eating meals?: None Help from another person taking care of personal grooming?: A Little Help from another person toileting, which includes using toliet, bedpan, or urinal?: A Little Help from another person bathing (including washing, rinsing, drying)?: A Little Help from another person to put on and taking off regular upper body clothing?: None Help from another person to put on and taking off regular lower body clothing?: A Little 6 Click Score: 20   End of Session Equipment Utilized During Treatment: Rolling walker (2 wheels);Oxygen  Activity Tolerance: Patient limited by fatigue Patient left: in bed;with call bell/phone within reach;with bed alarm set  OT Visit Diagnosis: Unsteadiness on feet (R26.81);Other abnormalities of gait and mobility (R26.89);Muscle weakness (generalized) (M62.81);Other (comment) (decreased activity tolerance)                Time: 5361-4431 OT Time Calculation (min): 10 min Charges:  OT General Charges $OT Visit: 1 Visit OT Evaluation $OT Eval Low Complexity: 1 Low  Berna Spare, OTR/L Acute Rehabilitation Services Office: [phone]   Evern Bio 07/09/2021, 11:58 AM

## 2021-07-10 ENCOUNTER — Inpatient Hospital Stay (HOSPITAL_COMMUNITY): Payer: Medicare Other

## 2021-07-10 DIAGNOSIS — J9601 Acute respiratory failure with hypoxia: Secondary | ICD-10-CM | POA: Diagnosis not present

## 2021-07-10 LAB — COMPREHENSIVE METABOLIC PANEL
ALT: 20 U/L (ref 0–44)
AST: 26 U/L (ref 15–41)
Albumin: 2.2 g/dL — ABNORMAL LOW (ref 3.5–5.0)
Alkaline Phosphatase: 101 U/L (ref 38–126)
Anion gap: 13 (ref 5–15)
BUN: 22 mg/dL (ref 8–23)
CO2: 24 mmol/L (ref 22–32)
Calcium: 8.1 mg/dL — ABNORMAL LOW (ref 8.9–10.3)
Chloride: 95 mmol/L — ABNORMAL LOW (ref 98–111)
Creatinine, Ser: 1.74 mg/dL — ABNORMAL HIGH (ref 0.44–1.00)
GFR, Estimated: 29 mL/min — ABNORMAL LOW (ref >60)
Glucose, Bld: 116 mg/dL — ABNORMAL HIGH (ref 70–99)
Potassium: 2.9 mmol/L — ABNORMAL LOW (ref 3.5–5.1)
Sodium: 132 mmol/L — ABNORMAL LOW (ref 135–145)
Total Bilirubin: 0.4 mg/dL (ref 0.3–1.2)
Total Protein: 5.8 g/dL — ABNORMAL LOW (ref 6.5–8.1)

## 2021-07-10 LAB — MAGNESIUM: Magnesium: 1.7 mg/dL (ref 1.7–2.4)

## 2021-07-10 LAB — GLUCOSE, CAPILLARY
Glucose-Capillary: 109 mg/dL — ABNORMAL HIGH (ref 70–99)
Glucose-Capillary: 144 mg/dL — ABNORMAL HIGH (ref 70–99)
Glucose-Capillary: 123 mg/dL — ABNORMAL HIGH (ref 70–99)

## 2021-07-10 LAB — CBC WITH DIFFERENTIAL/PLATELET
Abs Immature Granulocytes: 0.3 10*3/uL — ABNORMAL HIGH (ref 0.00–0.07)
Basophils Absolute: 0 10*3/uL (ref 0.0–0.1)
Basophils Relative: 0 %
Eosinophils Absolute: 0.3 10*3/uL (ref 0.0–0.5)
Eosinophils Relative: 2 %
HCT: 27.2 % — ABNORMAL LOW (ref 36.0–46.0)
Hemoglobin: 9.1 g/dL — ABNORMAL LOW (ref 12.0–15.0)
Immature Granulocytes: 2 %
Lymphocytes Relative: 5 %
Lymphs Abs: 1 10*3/uL (ref 0.7–4.0)
MCH: 28.6 pg (ref 26.0–34.0)
MCHC: 33.5 g/dL (ref 30.0–36.0)
MCV: 85.5 fL (ref 80.0–100.0)
Monocytes Absolute: 1.4 10*3/uL — ABNORMAL HIGH (ref 0.1–1.0)
Monocytes Relative: 8 %
Neutro Abs: 14.8 10*3/uL — ABNORMAL HIGH (ref 1.7–7.7)
Neutrophils Relative %: 83 %
Platelets: 283 10*3/uL (ref 150–400)
RBC: 3.18 MIL/uL — ABNORMAL LOW (ref 3.87–5.11)
RDW: 13.1 % (ref 11.5–15.5)
WBC: 17.8 10*3/uL — ABNORMAL HIGH (ref 4.0–10.5)
nRBC: 0 % (ref 0.0–0.2)

## 2021-07-10 LAB — BRAIN NATRIURETIC PEPTIDE: B Natriuretic Peptide: 449.3 pg/mL — ABNORMAL HIGH (ref 0.0–100.0)

## 2021-07-10 LAB — PROCALCITONIN: Procalcitonin: 0.53 ng/mL

## 2021-07-10 LAB — UREA NITROGEN, URINE: Urea Nitrogen, Ur: 447 mg/dL

## 2021-07-10 MED ORDER — POTASSIUM CHLORIDE CRYS ER 20 MEQ PO TBCR
40.0000 meq | EXTENDED_RELEASE_TABLET | Freq: Two times a day (BID) | ORAL | Status: AC
  Administered 2021-07-10 (×2): 40 meq via ORAL
  Filled 2021-07-10 (×2): qty 2

## 2021-07-10 MED ORDER — FERROUS SULFATE 325 (65 FE) MG PO TABS
325.0000 mg | ORAL_TABLET | Freq: Every day | ORAL | Status: DC
  Administered 2021-07-10 – 2021-07-12 (×3): 325 mg via ORAL
  Filled 2021-07-10 (×3): qty 1

## 2021-07-10 MED ORDER — MENTHOL 3 MG MT LOZG: 1.0000 | LOZENGE | OROMUCOSAL | Status: DC | PRN

## 2021-07-10 MED ORDER — LORATADINE 10 MG PO TABS
10.0000 mg | ORAL_TABLET | Freq: Every day | ORAL | Status: DC
  Administered 2021-07-10 – 2021-07-12 (×3): 10 mg via ORAL
  Filled 2021-07-10 (×3): qty 1

## 2021-07-10 NOTE — Progress Notes (Signed)
Physical Therapy Treatment Patient Details Name: Brandi [name] MRN: [id number] DOB: 02/22/1940 Today's Date: 07/10/2021   History of Present Illness 81 y.o. female with medical history significant of depression, anxiety, hyperlipidemia, hypertension, venous insufficiency presenting with shortness of breath.  She was diagnosed with pneumonia and admitted to the hospital.    PT Comments    Pt was seen for review of gait on RW with short stop in BR, then reviewed mobility on side of chair to stand.  ROM to LE's was instructed and pt had questions about when TKA would be needed as well as whether she could be safe to drive.  Referred both questions back to her surgeon, esp as driving would be about her reaction time per pt.  Follow up with her to get strength and tolerance to move back on RLE.  Pt is expecting to have HHPT when discharged to recover PLOF, esp to make R knee as strong and functional as possible to make knee injections for pain as effective as possible.   Recommendations for follow up therapy are one component of a multi-disciplinary discharge planning process, led by the attending physician.  Recommendations may be updated based on patient status, additional functional criteria and insurance authorization.  Follow Up Recommendations  Home health PT     Assistance Recommended at Discharge Intermittent Supervision/Assistance  Patient can return home with the following A little help with walking and/or transfers;Assistance with cooking/housework;Assist for transportation;Help with stairs or ramp for entrance   Equipment Recommendations  None recommended by PT    Recommendations for Other Services       Precautions / Restrictions Precautions Precautions: Other (comment);Fall Precaution Comments: watch sats Restrictions Weight Bearing Restrictions: No     Mobility  Bed Mobility               General bed mobility comments: up in chair    Transfers Overall transfer  level: Needs assistance Equipment used: Rolling walker (2 wheels) Transfers: Sit to/from Stand Sit to Stand: Min guard           General transfer comment: reminders of hand placement    Ambulation/Gait Ambulation/Gait assistance: Min guard Gait Distance (Feet): 45 Feet Assistive device: Rolling walker (2 wheels) Gait Pattern/deviations: Step-through pattern, Decreased step length - right, Decreased step length - left Gait velocity: redcued Gait velocity interpretation: <1.31 ft/sec, indicative of household ambulator Pre-gait activities: standing balance ck General Gait Details: sats dropped to 88 % on first leg of walk then were stable   Stairs             Wheelchair Mobility    Modified Rankin (Stroke Patients Only)       Balance Overall balance assessment: Needs assistance Sitting-balance support: Feet supported Sitting balance-Leahy Scale: Good     Standing balance support: Bilateral upper extremity supported, During functional activity Standing balance-Leahy Scale: Fair                              Cognition Arousal/Alertness: Awake/alert Behavior During Therapy: WFL for tasks assessed/performed Overall Cognitive Status: Within Functional Limits for tasks assessed                                          Exercises General Exercises - Lower Extremity Ankle Circles/Pumps: AROM, 5 reps Quad Sets: AROM, 10 reps Gluteal Sets: AROM,  10 reps    General Comments General comments (skin integrity, edema, etc.): pt is demonstrating a greater tolerance to move, with abiltiy to stand alone in BR using wall rail      Pertinent Vitals/Pain Pain Assessment Pain Assessment: Faces Faces Pain Scale: Hurts a little bit Pain Location: R knee with movement Pain Descriptors / Indicators: Guarding Pain Intervention(s): Monitored during session, Repositioned    Home Living                          Prior Function             PT Goals (current goals can now be found in the care plan section) Acute Rehab PT Goals Patient Stated Goal: to feel better, go home Progress towards PT goals: Progressing toward goals    Frequency    Min 3X/week      PT Plan Current plan remains appropriate    Co-evaluation              AM-PAC PT "6 Clicks" Mobility   Outcome Measure  Help needed turning from your back to your side while in a flat bed without using bedrails?: None Help needed moving from lying on your back to sitting on the side of a flat bed without using bedrails?: A Little Help needed moving to and from a bed to a chair (including a wheelchair)?: A Little Help needed standing up from a chair using your arms (e.g., wheelchair or bedside chair)?: A Little Help needed to walk in hospital room?: A Little Help needed climbing 3-5 steps with a railing? : A Little 6 Click Score: 19    End of Session Equipment Utilized During Treatment: Gait belt;Oxygen Activity Tolerance: Patient limited by fatigue Patient left: in chair;with call bell/phone within reach Nurse Communication: Mobility status PT Visit Diagnosis: Muscle weakness (generalized) (M62.81);Difficulty in walking, not elsewhere classified (R26.2)     Time: 4584-8350 PT Time Calculation (min) (ACUTE ONLY): 43 min  Charges:  $Gait Training: 8-22 mins $Therapeutic Exercise: 8-22 mins $Therapeutic Activity: 8-22 mins         Ivar Drape 07/10/2021, 5:49 PM  Samul Dada, PT PhD Acute Rehab Dept. Number: [facility] [id number] and [facility] [phone]

## 2021-07-10 NOTE — Progress Notes (Signed)
PROGRESS NOTE                                                                                                                                                                                                             Patient Demographics:    Brandi [name], is a 81 y.o. female, DOB - [birth year], [id number]  Outpatient Primary MD for the patient is Rogers Blocker, MD    LOS - 2  Admit date - 07/07/2021    Chief Complaint  Patient presents with   Shortness of Breath       Brief Narrative (HPI from H&P)     81 y.o. female with medical history significant of depression, anxiety, hyperlipidemia, hypertension, venous insufficiency presenting with shortness of breath.  She was diagnosed with pneumonia and admitted to the hospital.   Subjective:   Patient in bed, appears comfortable, denies any headache, no fever, no chest pain or pressure, mild cough but improved shortness of breath , no abdominal pain. No new focal weakness.   Assessment  & Plan :    Acute respiratory failure with hypoxia due to CAP -  presenting with 3 days of shortness of breath with associated cough.  Have leukocytosis along with mildly elevated procalcitonin, placed on appropriate antibiotics and IV fluids, encouraged to sit up in chair use I-S and flutter valve for pulmonary toiletry.  Monitor with supportive care, titrate down oxygen and increase activity.  Clinically improving.  AKI on CKD 3B-  due to dehydration, will check urine electrolytes, has been hydrated, monitor.  Last creatinine checked in PCP office on June 28, 2021 was 1.4.   Troponin elevation  > Mildly elevated troponin in the ED likely setting of heart strain secondary to pneumonia. Trend is flat and in ACS pattern, placed on baby aspirin, nonacute echocardiogram with stable EF of 60% and preserved wall motion.  Now on beta-blocker which will be continued along with statin.  Chronic anemia.   On oral iron supplementation continue to monitor with IV fluids expect some fall with heme dilution.  Anemia panel  Hyperlipidemia - Continue rosuvastatin  Hypertension  - Continue Coreg, felodipine, Hold home irbesartan in the setting of suspected AKI .  Depression - Continue home sertraline   Obesity  -BMI of 30 follow with PCP for weight loss  Chronic diastolic CHF with  EF 60%.  Currently clinically compensated although she has minimal Rales, developing AKI hence holding further diuretics       Condition - Extremely Guarded  Family Communication  :  none present  Code Status :  Full  Consults  :  None  PUD Prophylaxis : PPI   Procedures  :     TTE -  1. Left ventricular ejection fraction, by estimation, is 60 to 65%. The left ventricle has normal function. The left ventricle has no regional wall motion abnormalities. Left ventricular diastolic parameters are consistent with Grade I diastolic dysfunction (impaired relaxation).  2. Right ventricular systolic function is normal. The right ventricular size is normal. There is moderately elevated pulmonary artery systolic pressure.  3. Left atrial size was moderately dilated.  4. Right atrial size was moderately dilated.  5. The mitral valve is normal in structure. Trivial mitral valve regurgitation. No evidence of mitral stenosis.  6. The aortic valve is tricuspid. There is mild calcification of the aortic valve. Aortic valve regurgitation is trivial. Aortic valve sclerosis/calcification is present, without any evidence of aortic stenosis.  7. The inferior vena cava is normal in size with greater than 50% respiratory variability, suggesting right atrial pressure of 3 mmHg.      Disposition Plan  :    Status is: Inpt   DVT Prophylaxis  :    Place TED hose Start: 07/10/21 0842 enoxaparin (LOVENOX) injection 30 mg Start: 07/07/21 1900    Lab Results  Component Value Date   PLT 283 07/10/2021    Diet :  Diet Order              Diet regular Room service appropriate? Yes with Assist; Fluid consistency: Thin  Diet effective now                    Inpatient Medications  Scheduled Meds:  aspirin  81 mg Oral Daily   carvedilol  12.5 mg Oral BID   enoxaparin (LOVENOX) injection  30 mg Subcutaneous Q24H   felodipine  5 mg Oral BID   ferrous sulfate  325 mg Oral q AM   loratadine  10 mg Oral Daily   pantoprazole  40 mg Oral Daily   potassium chloride  40 mEq Oral BID   rosuvastatin  20 mg Oral Once per day on Wed Sat   sertraline  50 mg Oral Daily   sodium chloride flush  3 mL Intravenous Q12H   Continuous Infusions:  azithromycin 250 mg (07/09/21 2148)   cefTRIAXone (ROCEPHIN)  IV 1 g (07/09/21 1720)   PRN Meds:.acetaminophen **OR** acetaminophen, guaiFENesin, menthol-cetylpyridinium, polyethylene glycol  Time Spent in minutes  30   Lala Lund M.D on 07/10/2021 at 8:45 AM  To page go to [email or web address]   [facility] -  Office  [phone]  See all Orders from today for further details    Objective:   Vitals:   07/09/21 2313 07/09/21 2315 07/10/21 0406 07/10/21 0805  BP: 125/67 125/67 (!) 140/58 139/68  Pulse: 87 88 92 97  Resp: 16 (!) 24 18 16   Temp: 98.7 F (37.1 C) 98.5 F (36.9 C) 98.9 F (37.2 C) 99.2 F (37.3 C)  TempSrc: Oral Oral Oral Oral  SpO2: 93% 95% 95% 94%  Weight:      Height:        Wt Readings from Last 3 Encounters:  07/07/21 65.3 kg  03/24/21 65.3 kg  12/22/18 77.1 kg    No intake  or output data in the 24 hours ending 07/10/21 0845    Physical Exam  Awake Alert, No new F.N deficits, Normal affect [place].AT,PERRAL Supple Neck, No JVD,   Symmetrical Chest wall movement, Good air movement bilaterally, few rales RRR,No Gallops, Rubs or new Murmurs,  +ve B.Sounds, Abd Soft, No tenderness,   No Cyanosis, Clubbing or edema     Data Review:    CBC Recent Labs  Lab 07/07/21 1543 07/08/21 0324 07/09/21 0625 07/10/21 0319  WBC 19.9* 19.6*  18.7* 17.8*  HGB 10.4* 9.5* 8.8* 9.1*  HCT 31.8* 29.3* 27.8* 27.2*  PLT 277 263 276 283  MCV 86.2 86.7 87.1 85.5  MCH 28.2 28.1 27.6 28.6  MCHC 32.7 32.4 31.7 33.5  RDW 13.0 13.1 13.2 13.1  LYMPHSABS 0.9  --  0.7 1.0  MONOABS 1.3*  --  1.3* 1.4*  EOSABS 0.0  --  0.0 0.3  BASOSABS 0.0  --  0.0 0.0    Electrolytes Recent Labs  Lab 07/07/21 1543 07/08/21 0324 07/08/21 0334 07/09/21 0625 07/10/21 0319  NA 131* 130*  --  136 132*  K 4.0 3.9  --  3.5 2.9*  CL 94* 96*  --  103 95*  CO2 22 22  --  23 24  GLUCOSE 114* 102*  --  114* 116*  BUN 17 21  --  21 22  CREATININE 1.52* 1.44*  --  1.46* 1.74*  CALCIUM 9.2 8.7*  --  8.5* 8.1*  AST  --  22  --  18 26  ALT  --  15  --  15 20  ALKPHOS  --  99  --  100 101  BILITOT  --  0.5  --  0.6 0.4  ALBUMIN  --  2.6*  --  2.3* 2.2*  MG  --   --   --  1.9 1.7  PROCALCITON  --   --  0.67 0.55 0.53  BNP  --  603.0*  --  680.6* 449.3*     Micro Results No results found for this or any previous visit (from the past 240 hour(s)).  Radiology Reports DG Chest Port 1 View  Result Date: 07/10/2021 CLINICAL DATA:  Shortness of breath. EXAM: PORTABLE CHEST 1 VIEW COMPARISON:  07/09/2021 FINDINGS: 0631 hours. The cardio pericardial silhouette is enlarged. Retrocardiac left base collapse/consolidation is similar to prior. Right parahilar consolidative opacity is similar. No right pleural effusion. Telemetry leads overlie the chest. IMPRESSION: No substantial change. Bilateral collapse/consolidation, left greater than right. Electronically Signed   By: Misty Stanley M.D.   On: 07/10/2021 06:52   ECHOCARDIOGRAM COMPLETE  Result Date: 07/09/2021    ECHOCARDIOGRAM REPORT   Patient Name:   Brandi [name] Date of Exam: 07/09/2021 Medical Rec #:  [id number]    Height:       58.0 in Accession #:    [id number]   Weight:       144.0 lb Date of Birth:  Mar 15, 1940     BSA:          1.584 m Patient Age:    67 years     BP:           137/57 mmHg Patient Gender: F             HR:           85 bpm. Exam Location:  Inpatient Procedure: 2D Echo, Cardiac Doppler and Color Doppler Indications:    CHF  History:  Patient has no prior history of Echocardiogram examinations.  Sonographer:    Eduard Roux Referring Phys: Effie Shy Samary Shatz [facility] IMPRESSIONS  1. Left ventricular ejection fraction, by estimation, is 60 to 65%. The left ventricle has normal function. The left ventricle has no regional wall motion abnormalities. Left ventricular diastolic parameters are consistent with Grade I diastolic dysfunction (impaired relaxation).  2. Right ventricular systolic function is normal. The right ventricular size is normal. There is moderately elevated pulmonary artery systolic pressure.  3. Left atrial size was moderately dilated.  4. Right atrial size was moderately dilated.  5. The mitral valve is normal in structure. Trivial mitral valve regurgitation. No evidence of mitral stenosis.  6. The aortic valve is tricuspid. There is mild calcification of the aortic valve. Aortic valve regurgitation is trivial. Aortic valve sclerosis/calcification is present, without any evidence of aortic stenosis.  7. The inferior vena cava is normal in size with greater than 50% respiratory variability, suggesting right atrial pressure of 3 mmHg. FINDINGS  Left Ventricle: Left ventricular ejection fraction, by estimation, is 60 to 65%. The left ventricle has normal function. The left ventricle has no regional wall motion abnormalities. The left ventricular internal cavity size was normal in size. There is  no left ventricular hypertrophy. Left ventricular diastolic parameters are consistent with Grade I diastolic dysfunction (impaired relaxation). Right Ventricle: The right ventricular size is normal. No increase in right ventricular wall thickness. Right ventricular systolic function is normal. There is moderately elevated pulmonary artery systolic pressure. The tricuspid regurgitant velocity is 3.39  m/s, and with an assumed right atrial pressure of 5 mmHg, the estimated right ventricular systolic pressure is 51.0 mmHg. Left Atrium: Left atrial size was moderately dilated. Right Atrium: Right atrial size was moderately dilated. Pericardium: There is no evidence of pericardial effusion. Mitral Valve: The mitral valve is normal in structure. Mild mitral annular calcification. Trivial mitral valve regurgitation. No evidence of mitral valve stenosis. Tricuspid Valve: The tricuspid valve is normal in structure. Tricuspid valve regurgitation is trivial. No evidence of tricuspid stenosis. Aortic Valve: The aortic valve is tricuspid. There is mild calcification of the aortic valve. Aortic valve regurgitation is trivial. Aortic valve sclerosis/calcification is present, without any evidence of aortic stenosis. Aortic valve peak gradient measures 12.6 mmHg. Pulmonic Valve: The pulmonic valve was normal in structure. Pulmonic valve regurgitation is not visualized. No evidence of pulmonic stenosis. Aorta: The aortic root is normal in size and structure. Ascending aorta measurements are within normal limits for age when indexed to body surface area. Venous: The inferior vena cava is normal in size with greater than 50% respiratory variability, suggesting right atrial pressure of 3 mmHg. IAS/Shunts: No atrial level shunt detected by color flow Doppler.  LEFT VENTRICLE PLAX 2D LVIDd:         4.70 cm   Diastology LVIDs:         3.20 cm   LV e' medial:    7.35 cm/s LV PW:         1.10 cm   LV E/e' medial:  14.7 LV IVS:        0.90 cm   LV e' lateral:   7.08 cm/s LVOT diam:     2.00 cm   LV E/e' lateral: 15.3 LV SV:         92 LV SV Index:   58 LVOT Area:     3.14 cm  RIGHT VENTRICLE  IVC RV Basal diam:  2.90 cm     IVC diam: 1.90 cm RV S prime:     19.90 cm/s TAPSE (M-mode): 2.9 cm LEFT ATRIUM             Index        RIGHT ATRIUM           Index LA diam:        3.50 cm 2.21 cm/m   RA Area:     16.50 cm LA Vol  (A2C):   71.3 ml 45.02 ml/m  RA Volume:   43.40 ml  27.40 ml/m LA Vol (A4C):   66.8 ml 42.18 ml/m LA Biplane Vol: 69.2 ml 43.69 ml/m  AORTIC VALVE                 PULMONIC VALVE AV Area (Vmax): 2.57 cm     PV Vmax:       0.83 m/s AV Vmax:        177.50 cm/s  PV Peak grad:  2.8 mmHg AV Peak Grad:   12.6 mmHg LVOT Vmax:      145.00 cm/s LVOT Vmean:     97.100 cm/s LVOT VTI:       0.292 m  AORTA Ao Root diam: 3.20 cm Ao Asc diam:  3.50 cm MITRAL VALVE                TRICUSPID VALVE MV Area (PHT): 4.06 cm     TR Peak grad:   46.0 mmHg MV Decel Time: 187 msec     TR Vmax:        339.00 cm/s MV E velocity: 108.00 cm/s MV A velocity: 86.60 cm/s   SHUNTS MV E/A ratio:  1.25         Systemic VTI:  0.29 m                             Systemic Diam: 2.00 cm Glori Bickers MD Electronically signed by Glori Bickers MD Signature Date/Time: 07/09/2021/2:41:58 PM    Final    DG Chest Port 1 View  Result Date: 07/09/2021 CLINICAL DATA:  Provided history: Shortness of breath. EXAM: PORTABLE CHEST 1 VIEW COMPARISON:  Prior chest radiograph 07/08/2021 and earlier. FINDINGS: Cardiomegaly. Persistent ill-defined opacity within the left mid to lower lung field, overall similar to the prior chest radiograph of 07/08/2021. Unchanged opacity within the perihilar right lung. No definite pleural effusion or evidence of pneumothorax. IMPRESSION: Persistent ill-defined opacity within the left mid to lower lung field, overall similar to the prior chest radiograph of 07/08/2021. This may reflect atelectasis and/or airspace consolidation. Unchanged opacity within the perihilar right lung, which may reflect atelectasis and/or airspace consolidation. Cardiomegaly. Electronically Signed   By: Kellie Simmering D.O.   On: 07/09/2021 07:03   DG Chest Port 1 View  Result Date: 07/08/2021 CLINICAL DATA:  Shortness of breath. EXAM: PORTABLE CHEST 1 VIEW COMPARISON:  07/07/2021 FINDINGS: 0611 hours. The cardio pericardial silhouette is enlarged.  Persistent left base collapse/consolidation with probable tiny left pleural effusion. Interval increase in right parahilar collapse/consolidative opacity. Bones are diffusely demineralized. Telemetry leads overlie the chest. IMPRESSION: 1. Interval increase in right parahilar collapse/consolidative opacity. 2. Persistent left base collapse/consolidation with probable tiny left pleural effusion. Electronically Signed   By: Misty Stanley M.D.   On: 07/08/2021 07:06   DG Chest 2 View  Result Date: 07/07/2021 CLINICAL DATA:  Shortness of breath  and cough EXAM: CHEST - 2 VIEW COMPARISON:  10/29/2020 FINDINGS: Small left pleural effusion. Patchy consolidations in the right mid lung and left base suspicious for pneumonia. Stable cardiomediastinal silhouette. No pneumothorax. Chronic compression fracture of the lower thoracic spine. IMPRESSION: 1. Patchy airspace consolidations within the bilateral lungs suspicious for pneumonia. Small left pleural effusion. Radiographic follow-up to resolution is recommended. Two Electronically Signed   By: Jasmine Pang M.D.   On: 07/07/2021 16:34

## 2021-07-11 ENCOUNTER — Other Ambulatory Visit (HOSPITAL_COMMUNITY): Payer: Self-pay

## 2021-07-11 ENCOUNTER — Inpatient Hospital Stay (HOSPITAL_COMMUNITY): Payer: Medicare Other

## 2021-07-11 DIAGNOSIS — J189 Pneumonia, unspecified organism: Secondary | ICD-10-CM | POA: Diagnosis not present

## 2021-07-11 DIAGNOSIS — J9601 Acute respiratory failure with hypoxia: Secondary | ICD-10-CM | POA: Diagnosis not present

## 2021-07-11 DIAGNOSIS — I4892 Unspecified atrial flutter: Secondary | ICD-10-CM | POA: Diagnosis not present

## 2021-07-11 DIAGNOSIS — I484 Atypical atrial flutter: Secondary | ICD-10-CM | POA: Diagnosis not present

## 2021-07-11 DIAGNOSIS — N179 Acute kidney failure, unspecified: Secondary | ICD-10-CM | POA: Diagnosis not present

## 2021-07-11 DIAGNOSIS — I5031 Acute diastolic (congestive) heart failure: Secondary | ICD-10-CM

## 2021-07-11 LAB — CBC WITH DIFFERENTIAL/PLATELET
Abs Immature Granulocytes: 0.41 10*3/uL — ABNORMAL HIGH (ref 0.00–0.07)
Basophils Absolute: 0.1 10*3/uL (ref 0.0–0.1)
Basophils Relative: 0 %
Eosinophils Absolute: 0.3 10*3/uL (ref 0.0–0.5)
Eosinophils Relative: 2 %
HCT: 27.4 % — ABNORMAL LOW (ref 36.0–46.0)
Hemoglobin: 8.8 g/dL — ABNORMAL LOW (ref 12.0–15.0)
Immature Granulocytes: 3 %
Lymphocytes Relative: 6 %
Lymphs Abs: 0.9 10*3/uL (ref 0.7–4.0)
MCH: 28.1 pg (ref 26.0–34.0)
MCHC: 32.1 g/dL (ref 30.0–36.0)
MCV: 87.5 fL (ref 80.0–100.0)
Monocytes Absolute: 1.3 10*3/uL — ABNORMAL HIGH (ref 0.1–1.0)
Monocytes Relative: 9 %
Neutro Abs: 11.5 10*3/uL — ABNORMAL HIGH (ref 1.7–7.7)
Neutrophils Relative %: 80 %
Platelets: 313 10*3/uL (ref 150–400)
RBC: 3.13 MIL/uL — ABNORMAL LOW (ref 3.87–5.11)
RDW: 13.2 % (ref 11.5–15.5)
WBC: 14.5 10*3/uL — ABNORMAL HIGH (ref 4.0–10.5)
nRBC: 0 % (ref 0.0–0.2)

## 2021-07-11 LAB — COMPREHENSIVE METABOLIC PANEL
ALT: 28 U/L (ref 0–44)
AST: 35 U/L (ref 15–41)
Albumin: 2.1 g/dL — ABNORMAL LOW (ref 3.5–5.0)
Alkaline Phosphatase: 96 U/L (ref 38–126)
Anion gap: 8 (ref 5–15)
BUN: 25 mg/dL — ABNORMAL HIGH (ref 8–23)
CO2: 24 mmol/L (ref 22–32)
Calcium: 8.4 mg/dL — ABNORMAL LOW (ref 8.9–10.3)
Chloride: 101 mmol/L (ref 98–111)
Creatinine, Ser: 1.5 mg/dL — ABNORMAL HIGH (ref 0.44–1.00)
GFR, Estimated: 35 mL/min — ABNORMAL LOW (ref >60)
Glucose, Bld: 142 mg/dL — ABNORMAL HIGH (ref 70–99)
Potassium: 3.8 mmol/L (ref 3.5–5.1)
Sodium: 133 mmol/L — ABNORMAL LOW (ref 135–145)
Total Bilirubin: 0.3 mg/dL (ref 0.3–1.2)
Total Protein: 5.6 g/dL — ABNORMAL LOW (ref 6.5–8.1)

## 2021-07-11 LAB — PROCALCITONIN: Procalcitonin: 0.24 ng/mL

## 2021-07-11 LAB — TSH: TSH: 0.785 u[IU]/mL (ref 0.350–4.500)

## 2021-07-11 LAB — MAGNESIUM: Magnesium: 1.7 mg/dL (ref 1.7–2.4)

## 2021-07-11 LAB — BRAIN NATRIURETIC PEPTIDE: B Natriuretic Peptide: 458.7 pg/mL — ABNORMAL HIGH (ref 0.0–100.0)

## 2021-07-11 LAB — LEGIONELLA PNEUMOPHILA SEROGP 1 UR AG: L. pneumophila Serogp 1 Ur Ag: NEGATIVE

## 2021-07-11 MED ORDER — POTASSIUM CHLORIDE CRYS ER 20 MEQ PO TBCR
20.0000 meq | EXTENDED_RELEASE_TABLET | Freq: Once | ORAL | Status: AC
  Administered 2021-07-11: 20 meq via ORAL
  Filled 2021-07-11: qty 1

## 2021-07-11 MED ORDER — APIXABAN 2.5 MG PO TABS
2.5000 mg | ORAL_TABLET | Freq: Two times a day (BID) | ORAL | Status: DC
  Administered 2021-07-11 – 2021-07-12 (×3): 2.5 mg via ORAL
  Filled 2021-07-11 (×3): qty 1

## 2021-07-11 MED ORDER — SALINE SPRAY 0.65 % NA SOLN
1.0000 | NASAL | Status: DC | PRN
  Administered 2021-07-11: 1 via NASAL
  Filled 2021-07-11: qty 44

## 2021-07-11 MED ORDER — DILTIAZEM HCL 25 MG/5ML IV SOLN
10.0000 mg | Freq: Four times a day (QID) | INTRAVENOUS | Status: DC | PRN
  Administered 2021-07-11: 10 mg via INTRAVENOUS
  Filled 2021-07-11: qty 5

## 2021-07-11 MED ORDER — DILTIAZEM HCL 60 MG PO TABS
30.0000 mg | ORAL_TABLET | Freq: Four times a day (QID) | ORAL | Status: DC
  Administered 2021-07-11 – 2021-07-12 (×4): 30 mg via ORAL
  Filled 2021-07-11 (×4): qty 1

## 2021-07-11 MED ORDER — MAGNESIUM SULFATE 2 GM/50ML IV SOLN
2.0000 g | Freq: Once | INTRAVENOUS | Status: AC
  Administered 2021-07-11: 2 g via INTRAVENOUS
  Filled 2021-07-11: qty 50

## 2021-07-11 MED ORDER — METOPROLOL TARTRATE 100 MG PO TABS
100.0000 mg | ORAL_TABLET | Freq: Two times a day (BID) | ORAL | Status: DC
  Administered 2021-07-11 – 2021-07-12 (×3): 100 mg via ORAL
  Filled 2021-07-11 (×3): qty 1

## 2021-07-12 DIAGNOSIS — J9601 Acute respiratory failure with hypoxia: Secondary | ICD-10-CM | POA: Diagnosis not present

## 2021-07-12 LAB — COMPREHENSIVE METABOLIC PANEL
ALT: 34 U/L (ref 0–44)
AST: 35 U/L (ref 15–41)
Albumin: 2.3 g/dL — ABNORMAL LOW (ref 3.5–5.0)
Alkaline Phosphatase: 92 U/L (ref 38–126)
Anion gap: 11 (ref 5–15)
BUN: 26 mg/dL — ABNORMAL HIGH (ref 8–23)
CO2: 24 mmol/L (ref 22–32)
Calcium: 8.6 mg/dL — ABNORMAL LOW (ref 8.9–10.3)
Chloride: 103 mmol/L (ref 98–111)
Creatinine, Ser: 1.28 mg/dL — ABNORMAL HIGH (ref 0.44–1.00)
GFR, Estimated: 42 mL/min — ABNORMAL LOW (ref >60)
Glucose, Bld: 128 mg/dL — ABNORMAL HIGH (ref 70–99)
Potassium: 4.2 mmol/L (ref 3.5–5.1)
Sodium: 138 mmol/L (ref 135–145)
Total Bilirubin: 0.3 mg/dL (ref 0.3–1.2)
Total Protein: 5.9 g/dL — ABNORMAL LOW (ref 6.5–8.1)

## 2021-07-12 LAB — PROCALCITONIN: Procalcitonin: 0.14 ng/mL

## 2021-07-12 LAB — CBC WITH DIFFERENTIAL/PLATELET
Abs Immature Granulocytes: 0.33 10*3/uL — ABNORMAL HIGH (ref 0.00–0.07)
Basophils Absolute: 0.1 10*3/uL (ref 0.0–0.1)
Basophils Relative: 0 %
Eosinophils Absolute: 0.5 10*3/uL (ref 0.0–0.5)
Eosinophils Relative: 3 %
HCT: 30.7 % — ABNORMAL LOW (ref 36.0–46.0)
Hemoglobin: 9.8 g/dL — ABNORMAL LOW (ref 12.0–15.0)
Immature Granulocytes: 2 %
Lymphocytes Relative: 8 %
Lymphs Abs: 1.1 10*3/uL (ref 0.7–4.0)
MCH: 28 pg (ref 26.0–34.0)
MCHC: 31.9 g/dL (ref 30.0–36.0)
MCV: 87.7 fL (ref 80.0–100.0)
Monocytes Absolute: 1.6 10*3/uL — ABNORMAL HIGH (ref 0.1–1.0)
Monocytes Relative: 10 %
Neutro Abs: 11.5 10*3/uL — ABNORMAL HIGH (ref 1.7–7.7)
Neutrophils Relative %: 77 %
Platelets: 340 10*3/uL (ref 150–400)
RBC: 3.5 MIL/uL — ABNORMAL LOW (ref 3.87–5.11)
RDW: 13.2 % (ref 11.5–15.5)
WBC: 15 10*3/uL — ABNORMAL HIGH (ref 4.0–10.5)
nRBC: 0 % (ref 0.0–0.2)

## 2021-07-12 LAB — BRAIN NATRIURETIC PEPTIDE: B Natriuretic Peptide: 780.6 pg/mL — ABNORMAL HIGH (ref 0.0–100.0)

## 2021-07-12 LAB — MAGNESIUM: Magnesium: 2.4 mg/dL (ref 1.7–2.4)

## 2021-07-12 MED ORDER — PANTOPRAZOLE SODIUM 40 MG PO TBEC
40.0000 mg | DELAYED_RELEASE_TABLET | Freq: Every day | ORAL | 0 refills | Status: AC
Start: 2021-07-12 — End: ?

## 2021-07-12 MED ORDER — APIXABAN 2.5 MG PO TABS: 2.5000 mg | ORAL_TABLET | Freq: Two times a day (BID) | ORAL | 0 refills | Status: DC

## 2021-07-12 MED ORDER — DILTIAZEM HCL 60 MG PO TABS: 60.0000 mg | ORAL_TABLET | Freq: Two times a day (BID) | ORAL | 0 refills | Status: DC

## 2021-07-12 MED ORDER — CEPHALEXIN 500 MG PO CAPS: 500.0000 mg | ORAL_CAPSULE | Freq: Three times a day (TID) | ORAL | 0 refills | Status: AC

## 2021-07-12 MED ORDER — FUROSEMIDE 40 MG PO TABS
40.0000 mg | ORAL_TABLET | Freq: Once | ORAL | Status: AC
  Administered 2021-07-12: 40 mg via ORAL
  Filled 2021-07-12: qty 1

## 2021-07-12 MED ORDER — METOPROLOL TARTRATE 100 MG PO TABS: 100.0000 mg | ORAL_TABLET | Freq: Two times a day (BID) | ORAL | 0 refills | Status: DC

## 2021-07-12 MED ORDER — AZITHROMYCIN 250 MG PO TABS
250.0000 mg | ORAL_TABLET | Freq: Every day | ORAL | 0 refills | Status: DC
Start: 2021-07-12 — End: 2021-07-17

## 2021-07-17 ENCOUNTER — Ambulatory Visit (HOSPITAL_COMMUNITY)
Admission: RE | Admit: 2021-07-17 | Discharge: 2021-07-17 | Disposition: A | Discharge status: Home / Self Care | Payer: Medicare Other | Source: Ambulatory Visit | Attending: Physician Assistant | Admitting: Physician Assistant

## 2021-07-17 ENCOUNTER — Telehealth: Payer: Self-pay

## 2021-07-17 ENCOUNTER — Encounter (HOSPITAL_COMMUNITY): Payer: Self-pay | Admitting: Physician Assistant

## 2021-07-17 VITALS — BP 118/70 | HR 61 | Ht <= 58 in | Wt 134.5 lb

## 2021-07-17 DIAGNOSIS — E785 Hyperlipidemia, unspecified: Secondary | ICD-10-CM | POA: Diagnosis not present

## 2021-07-17 DIAGNOSIS — I484 Atypical atrial flutter: Secondary | ICD-10-CM

## 2021-07-17 DIAGNOSIS — I10 Essential (primary) hypertension: Secondary | ICD-10-CM | POA: Insufficient documentation

## 2021-07-17 DIAGNOSIS — Z79899 Other long term (current) drug therapy: Secondary | ICD-10-CM | POA: Diagnosis not present

## 2021-07-17 DIAGNOSIS — D6869 Other thrombophilia: Secondary | ICD-10-CM

## 2021-07-17 DIAGNOSIS — Z7901 Long term (current) use of anticoagulants: Secondary | ICD-10-CM | POA: Diagnosis not present

## 2021-07-17 MED ORDER — WARFARIN SODIUM 5 MG PO TABS: 5.0000 mg | ORAL_TABLET | Freq: Every day | ORAL | 0 refills | Status: DC

## 2021-07-17 NOTE — Addendum Note (Signed)
Encounter addended by: Danice Goltz, PA on: 07/17/2021 4:36 PM  Actions taken: Clinical Note Signed

## 2021-07-17 NOTE — Telephone Encounter (Signed)
Called and spoke with pt concerning switching from Eliquis to Warfarin Clint Fenton,PA office note on 07/17/21.   Pt stated she still has 1 month of Eliquis and wants to continue Eliquis until she the end of July.   Provided pt with the following instructions: Pt also requested instructed to also be mailed to her. Mail placed in mailbox.   SWITCHING FROM ELIQUIS TO WARFARIN INSTRUCTIONS:   July 20th: Take Warfarin 5mg  at 4pm daily AND Eliquis two times daily (at 8am & 8pm)   July 21st: Take Warfarin 5mg  at 4pm daily AND Eliquis two times daily (at 8am & 8pm)   July 22nd: Take Warfarin 5mg  at 4pm daily AND Eliquis two times daily (at 8am & 8pm)  on July 20th, 21st, and 22nd.  July 23rd: STOP Eliquis and continue taking Warfarin 5mg  by mouth daily.   July 24th - July 25th: continue taking Warfarin 5mg  by mouth daily.   Appointment with [facility] is scheduled for July 26th, 2023 at 2:30pm in [place].

## 2021-07-17 NOTE — Progress Notes (Addendum)
Primary Care Physician: Gwenyth Bender, MD Primary Cardiologist: Dr Mariah Milling Primary Electrophysiologist: none Referring Physician: Dr Mayford Knife   Brandi [name] is a 81 y.o. female with a history of HTN, HLD, varicose veins, atrial flutter who presents for consultation in the [facility]. Patient was admitted on 07/07/2021 for acute hypoxic respiratory failure secondary to pneumonia after presenting with shortness of breath and cough for 3 days. Chest x-ray showed patchy airspace disease consistent with bilateral pneumonia and small left pleural effusion. Initial EKG showed normal sinus rhythm with no acute ischemic changes. Echo showed LVEF of 60-65% with normal wall motion and grade 1 diastolic dysfunction, normal RV, moderate biatrial enlargement, and moderately elevated PASP. She has been treated with antibiotics. On the evening of 07/10/2021, she was noted to go into atrial flutter with RVR. Repeat EKG today shows atrial flutter, rate 119 bpm, with variable AV block. Cardiology consulted for further evaluation. patient was unaware of her arrhythmia. Her carvedilol was changed to metoprolol and she was started on Eliquis for a CHADS2VASC score of 4.  Today, patient has spontaneously converted back to SR. She is feeling better as her pneumonia resolves although she is still on O2 nasal canula. No bleeding issues on anticoagulation.   Today, she denies symptoms of palpitations, chest pain, orthopnea, PND, lower extremity edema, dizziness, presyncope, syncope, snoring, daytime somnolence, bleeding, or neurologic sequela. The patient is tolerating medications without difficulties and is otherwise without complaint today.    Atrial Fibrillation Risk Factors:  she does not have symptoms or diagnosis of sleep apnea. she does not have a history of rheumatic fever.   she has a BMI of Body mass index is 28.11 kg/m.Marland Kitchen Filed Weights   07/17/21 1514  Weight: 61 kg    Family  History  Problem Relation Age of Onset   Heart attack Father      Atrial Fibrillation Management history:  Previous antiarrhythmic drugs: none Previous cardioversions: none Previous ablations: none CHADS2VASC score: 4 Anticoagulation history: Eliquis   Past Medical History:  Diagnosis Date   Allergy    Hyperlipidemia    Hypertension    Neuromuscular disorder (HCC)    Venous insufficiency    Past Surgical History:  Procedure Laterality Date   ANKLE SURGERY Left    KNEE SURGERY Left    VARICOSE VEIN SURGERY      Current Outpatient Medications  Medication Sig Dispense Refill   acetaminophen (TYLENOL) 500 MG tablet Take 500-1,000 mg by mouth every 6 (six) hours as needed for mild pain.     apixaban (ELIQUIS) 2.5 MG TABS tablet Take 1 tablet (2.5 mg total) by mouth 2 (two) times daily. 60 tablet 0   azelastine (ASTELIN) 0.1 % nasal spray Place 1 spray into both nostrils daily.     cetirizine (ZYRTEC) 10 MG tablet Take 10 mg by mouth daily as needed for allergies.     diltiazem (CARDIZEM) 60 MG tablet Take 1 tablet (60 mg total) by mouth 2 (two) times daily. 60 tablet 0   ferrous sulfate 325 (65 FE) MG tablet Take 325 mg by mouth in the morning.     guanFACINE (TENEX) 2 MG tablet Take 4 mg by mouth at bedtime.     Lidocaine HCl (ASPERCREME LIDOCAINE) 4 % LIQD Apply 1 Application topically 2 (two) times daily as needed (knee pain).     metoprolol tartrate (LOPRESSOR) 100 MG tablet Take 1 tablet (100 mg total) by mouth 2 (two) times daily. 60  tablet 0   Multiple Vitamins-Minerals (CENTRUM SILVER 50+WOMEN) TABS Take 1 tablet by mouth every evening.     Multiple Vitamins-Minerals (EYE HEALTH PO) Take 1 tablet by mouth 2 (two) times a week. Wednesday, Sunday     Omega-3 Fatty Acids (FISH OIL PO) Take 1 capsule by mouth every evening.     pantoprazole (PROTONIX) 40 MG tablet Take 1 tablet (40 mg total) by mouth daily. 30 tablet 0   Propylene Glycol (SYSTANE COMPLETE PF OP) Place 1  drop into both eyes 3 (three) times daily.     rosuvastatin (CRESTOR) 20 MG tablet Take 10 mg by mouth 2 (two) times a week. 10 mg on Wednesday, Sunday evening     sertraline (ZOLOFT) 50 MG tablet Take 75 mg by mouth every evening.     No current facility-administered medications for this encounter.    Allergies  Allergen Reactions   Amoxil [Amoxicillin] Diarrhea   Celebrex [Celecoxib] Other (See Comments)    Bruising Stomach pain    Social History   Socioeconomic History   Marital status: Married    Spouse name: Not on file   Number of children: Not on file   Years of education: Not on file   Highest education level: Not on file  Occupational History   Not on file  Tobacco Use   Smoking status: Never   Smokeless tobacco: Never   Tobacco comments:    Never smoke 07/17/21  Vaping Use   Vaping Use: Never used  Substance and Sexual Activity   Alcohol use: Never   Drug use: Never   Sexual activity: Not on file  Other Topics Concern   Not on file  Social History Narrative   Not on file   Social Determinants of Health   Financial Resource Strain: Not on file  Food Insecurity: Not on file  Transportation Needs: Not on file  Physical Activity: Not on file  Stress: Not on file  Social Connections: Not on file  Intimate Partner Violence: Not on file     ROS- All systems are reviewed and negative except as per the HPI above.  Physical Exam: Vitals:   07/17/21 1514  BP: 118/70  Pulse: 61  Weight: 61 kg  Height: 4\' 10"  (1.473 m)    GEN- The patient is a well appearing elderly female, alert and oriented x 3 today.   Head- normocephalic, atraumatic Eyes-  Sclera clear, conjunctiva pink Ears- hearing intact Oropharynx- clear Neck- supple  Lungs- faint crackles LLL, normal work of breathing Heart- Regular rate and rhythm, no murmurs, rubs or gallops  GI- soft, NT, ND, + BS Extremities- no clubbing, cyanosis, or edema MS- no significant deformity or  atrophy Skin- no rash or lesion Psych- euthymic mood, full affect Neuro- strength and sensation are intact  Wt Readings from Last 3 Encounters:  07/17/21 61 kg  07/07/21 65.3 kg  03/24/21 65.3 kg    EKG today demonstrates  SR Vent. rate 61 BPM PR interval 144 ms QRS duration 82 ms QT/QTcB 420/422 ms  Echo 07/09/21 demonstrated   1. Left ventricular ejection fraction, by estimation, is 60 to 65%. The  left ventricle has normal function. The left ventricle has no regional  wall motion abnormalities. Left ventricular diastolic parameters are  consistent with Grade I diastolic dysfunction (impaired relaxation).   2. Right ventricular systolic function is normal. The right ventricular  size is normal. There is moderately elevated pulmonary artery systolic  pressure.   3. Left atrial  size was moderately dilated.   4. Right atrial size was moderately dilated.   5. The mitral valve is normal in structure. Trivial mitral valve  regurgitation. No evidence of mitral stenosis.   6. The aortic valve is tricuspid. There is mild calcification of the  aortic valve. Aortic valve regurgitation is trivial. Aortic valve  sclerosis/calcification is present, without any evidence of aortic  stenosis.   7. The inferior vena cava is normal in size with greater than 50%  respiratory variability, suggesting right atrial pressure of 3 mmHg.   Epic records are reviewed at length today  CHA2DS2-VASc Score = 4  The patient's score is based upon: CHF History: 0 HTN History: 1 Diabetes History: 0 Stroke History: 0 Vascular Disease History: 0 Age Score: 2 Gender Score: 1       ASSESSMENT AND PLAN: 1. Atypical atrial flutter The patient's CHA2DS2-VASc score is 4, indicating a 4.8% annual risk of stroke.   Patient converted back to SR with treatment of her pneumonia. Suspect episodes was related to infection.  Continue Eliquis 2.5 mg BID for now, will request recent labs from PCP to see if we need  to increase dose to 5 mg BID. Long term, patient reports Eliquis is going to be cost prohibitive. Her husband is on warfarin and she would like to be changed to this when her current prescription ends, will refer. Continue Lopressor 100 mg BID Continue diltiazem 60 mg BID  2. Secondary Hypercoagulable State (ICD10:  D68.69) The patient is at significant risk for stroke/thromboembolism based upon her CHA2DS2-VASc Score of 4.  Continue Apixaban (Eliquis).   3. HTN Stable, no changes today.  4. HTN Stable, no changes today.   Follow up with Dr Rockey Situ as scheduled.    Addendum: Lab work from PCP 07/16/21 Cr 1.36 she would qualify for 5 mg dose of Eliquis. Hgb 9.1 which is low but stable from hospital. She will f/u with PCP for anemia.    [facility] [address] [place], [place] [address] [phone] 07/17/2021 3:30 PM

## 2021-07-18 ENCOUNTER — Other Ambulatory Visit (HOSPITAL_COMMUNITY): Payer: Self-pay | Admitting: *Deleted

## 2021-07-18 ENCOUNTER — Ambulatory Visit
Admission: RE | Admit: 2021-07-18 | Discharge: 2021-07-18 | Disposition: A | Discharge status: Home / Self Care | Payer: Medicare Other | Source: Ambulatory Visit | Attending: Internal Medicine | Admitting: Internal Medicine

## 2021-07-18 ENCOUNTER — Other Ambulatory Visit: Payer: Self-pay | Admitting: Internal Medicine

## 2021-07-18 DIAGNOSIS — J9600 Acute respiratory failure, unspecified whether with hypoxia or hypercapnia: Secondary | ICD-10-CM

## 2021-07-18 DIAGNOSIS — J189 Pneumonia, unspecified organism: Secondary | ICD-10-CM

## 2021-08-08 NOTE — Telephone Encounter (Signed)
Pt c/o medication issue:  1. Name of Medication:  Eliquis   2. How are you currently taking this medication (dosage and times per day)?   3. Are you having a reaction (difficulty breathing--STAT)?   4. What is your medication issue?   Patient is following up. She states she is now completely out of Eliquis and not sure what to do prior to 7/26 Coumadin appointment. She states she never knew Warfarin was sent to her local pharmacy so she has not transitioned into taking it.

## 2021-08-08 NOTE — Telephone Encounter (Signed)
I spoke to the patient and will start Coumadin prior to her visit on 7/26.  Verbalized understanding

## 2021-08-13 ENCOUNTER — Other Ambulatory Visit: Payer: Self-pay

## 2021-08-13 ENCOUNTER — Ambulatory Visit (INDEPENDENT_AMBULATORY_CARE_PROVIDER_SITE_OTHER): Payer: Medicare Other

## 2021-08-13 DIAGNOSIS — I4892 Unspecified atrial flutter: Secondary | ICD-10-CM | POA: Diagnosis not present

## 2021-08-13 DIAGNOSIS — Z5181 Encounter for therapeutic drug level monitoring: Secondary | ICD-10-CM

## 2021-08-13 DIAGNOSIS — I484 Atypical atrial flutter: Secondary | ICD-10-CM

## 2021-08-13 DIAGNOSIS — Z7901 Long term (current) use of anticoagulants: Secondary | ICD-10-CM | POA: Insufficient documentation

## 2021-08-13 LAB — POCT INR: INR: 3.2 — AB (ref 2.0–3.0)

## 2021-08-13 NOTE — Patient Instructions (Signed)
HOLD TONIGHT ONLY and then start 0.5 tablet Daily, except 1 tablet Monday, Wednesday and Friday.  INR in 1 week.  [phone]  A full discussion of the nature of anticoagulants has been carried out.  A benefit risk analysis has been presented to the patient, so that they understand the justification for choosing anticoagulation at this time. The need for frequent and regular monitoring, precise dosage adjustment and compliance is stressed.  Side effects of potential bleeding are discussed.  The patient should avoid any OTC items containing aspirin or ibuprofen, and should avoid great swings in general diet.  Avoid alcohol consumption.  Call if any signs of abnormal bleeding.

## 2021-08-14 ENCOUNTER — Observation Stay (HOSPITAL_COMMUNITY)
Admission: RE | Admit: 2021-08-14 | Discharge: 2021-08-16 | Disposition: A | Discharge status: Home Health | Payer: Medicare Other | Attending: Internal Medicine | Admitting: Internal Medicine

## 2021-08-14 ENCOUNTER — Encounter (HOSPITAL_COMMUNITY): Payer: Self-pay | Admitting: Emergency Medicine

## 2021-08-14 ENCOUNTER — Other Ambulatory Visit: Payer: Self-pay

## 2021-08-14 ENCOUNTER — Emergency Department (HOSPITAL_COMMUNITY): Payer: Medicare Other

## 2021-08-14 DIAGNOSIS — R7989 Other specified abnormal findings of blood chemistry: Secondary | ICD-10-CM | POA: Diagnosis present

## 2021-08-14 DIAGNOSIS — N179 Acute kidney failure, unspecified: Secondary | ICD-10-CM | POA: Diagnosis not present

## 2021-08-14 DIAGNOSIS — R410 Disorientation, unspecified: Secondary | ICD-10-CM | POA: Diagnosis present

## 2021-08-14 DIAGNOSIS — D500 Iron deficiency anemia secondary to blood loss (chronic): Secondary | ICD-10-CM | POA: Diagnosis not present

## 2021-08-14 DIAGNOSIS — R2689 Other abnormalities of gait and mobility: Secondary | ICD-10-CM | POA: Insufficient documentation

## 2021-08-14 DIAGNOSIS — R531 Weakness: Secondary | ICD-10-CM | POA: Diagnosis not present

## 2021-08-14 DIAGNOSIS — I480 Paroxysmal atrial fibrillation: Secondary | ICD-10-CM | POA: Diagnosis not present

## 2021-08-14 DIAGNOSIS — D649 Anemia, unspecified: Secondary | ICD-10-CM

## 2021-08-14 DIAGNOSIS — Z79899 Other long term (current) drug therapy: Secondary | ICD-10-CM | POA: Insufficient documentation

## 2021-08-14 DIAGNOSIS — I129 Hypertensive chronic kidney disease with stage 1 through stage 4 chronic kidney disease, or unspecified chronic kidney disease: Secondary | ICD-10-CM | POA: Insufficient documentation

## 2021-08-14 DIAGNOSIS — Z7901 Long term (current) use of anticoagulants: Secondary | ICD-10-CM

## 2021-08-14 DIAGNOSIS — R8271 Bacteriuria: Secondary | ICD-10-CM | POA: Insufficient documentation

## 2021-08-14 DIAGNOSIS — E876 Hypokalemia: Secondary | ICD-10-CM

## 2021-08-14 DIAGNOSIS — G9341 Metabolic encephalopathy: Secondary | ICD-10-CM

## 2021-08-14 DIAGNOSIS — N1832 Chronic kidney disease, stage 3b: Secondary | ICD-10-CM | POA: Diagnosis present

## 2021-08-14 DIAGNOSIS — Z9181 History of falling: Secondary | ICD-10-CM | POA: Insufficient documentation

## 2021-08-14 DIAGNOSIS — R778 Other specified abnormalities of plasma proteins: Secondary | ICD-10-CM

## 2021-08-14 DIAGNOSIS — N189 Chronic kidney disease, unspecified: Secondary | ICD-10-CM

## 2021-08-14 DIAGNOSIS — I4891 Unspecified atrial fibrillation: Secondary | ICD-10-CM

## 2021-08-14 DIAGNOSIS — I484 Atypical atrial flutter: Secondary | ICD-10-CM

## 2021-08-14 LAB — URINALYSIS, ROUTINE W REFLEX MICROSCOPIC
Bilirubin Urine: NEGATIVE
Glucose, UA: NEGATIVE mg/dL
Ketones, ur: NEGATIVE mg/dL
Nitrite: NEGATIVE
Protein, ur: 100 mg/dL — AB
Specific Gravity, Urine: 1.004 — ABNORMAL LOW (ref 1.005–1.030)
pH: 6 (ref 5.0–8.0)

## 2021-08-14 LAB — CBC WITH DIFFERENTIAL/PLATELET
Abs Immature Granulocytes: 0.02 10*3/uL (ref 0.00–0.07)
Basophils Absolute: 0.1 10*3/uL (ref 0.0–0.1)
Basophils Relative: 1 %
Eosinophils Absolute: 0.5 10*3/uL (ref 0.0–0.5)
Eosinophils Relative: 7 %
HCT: 37.1 % (ref 36.0–46.0)
Hemoglobin: 11.5 g/dL — ABNORMAL LOW (ref 12.0–15.0)
Immature Granulocytes: 0 %
Lymphocytes Relative: 30 %
Lymphs Abs: 2.1 10*3/uL (ref 0.7–4.0)
MCH: 27.9 pg (ref 26.0–34.0)
MCHC: 31 g/dL (ref 30.0–36.0)
MCV: 90 fL (ref 80.0–100.0)
Monocytes Absolute: 0.8 10*3/uL (ref 0.1–1.0)
Monocytes Relative: 11 %
Neutro Abs: 3.7 10*3/uL (ref 1.7–7.7)
Neutrophils Relative %: 51 %
Platelets: 259 10*3/uL (ref 150–400)
RBC: 4.12 MIL/uL (ref 3.87–5.11)
RDW: 14.3 % (ref 11.5–15.5)
WBC: 7.1 10*3/uL (ref 4.0–10.5)
nRBC: 0 % (ref 0.0–0.2)

## 2021-08-14 LAB — PROTIME-INR
INR: 2.3 — ABNORMAL HIGH (ref 0.8–1.2)
Prothrombin Time: 25.1 seconds — ABNORMAL HIGH (ref 11.4–15.2)

## 2021-08-14 LAB — COMPREHENSIVE METABOLIC PANEL
ALT: 17 U/L (ref 0–44)
AST: 23 U/L (ref 15–41)
Albumin: 3.6 g/dL (ref 3.5–5.0)
Alkaline Phosphatase: 80 U/L (ref 38–126)
Anion gap: 11 (ref 5–15)
BUN: 32 mg/dL — ABNORMAL HIGH (ref 8–23)
CO2: 27 mmol/L (ref 22–32)
Calcium: 9.9 mg/dL (ref 8.9–10.3)
Chloride: 101 mmol/L (ref 98–111)
Creatinine, Ser: 1.66 mg/dL — ABNORMAL HIGH (ref 0.44–1.00)
GFR, Estimated: 31 mL/min — ABNORMAL LOW (ref >60)
Glucose, Bld: 94 mg/dL (ref 70–99)
Potassium: 3.2 mmol/L — ABNORMAL LOW (ref 3.5–5.1)
Sodium: 139 mmol/L (ref 135–145)
Total Bilirubin: 0.4 mg/dL (ref 0.3–1.2)
Total Protein: 7.4 g/dL (ref 6.5–8.1)

## 2021-08-14 LAB — MAGNESIUM: Magnesium: 2.1 mg/dL (ref 1.7–2.4)

## 2021-08-14 LAB — TROPONIN I (HIGH SENSITIVITY)
Troponin I (High Sensitivity): 38 ng/L — ABNORMAL HIGH (ref <18)
Troponin I (High Sensitivity): 33 ng/L — ABNORMAL HIGH (ref <18)

## 2021-08-14 LAB — APTT: aPTT: 45 seconds — ABNORMAL HIGH (ref 24–36)

## 2021-08-14 LAB — TSH: TSH: 1.427 u[IU]/mL (ref 0.350–4.500)

## 2021-08-14 MED ORDER — ONDANSETRON HCL 4 MG PO TABS: 4.0000 mg | ORAL_TABLET | Freq: Four times a day (QID) | ORAL | Status: DC | PRN

## 2021-08-14 MED ORDER — WARFARIN SODIUM 2.5 MG PO TABS
2.5000 mg | ORAL_TABLET | ORAL | Status: DC
  Administered 2021-08-14: 2.5 mg via ORAL
  Filled 2021-08-14: qty 1

## 2021-08-14 MED ORDER — POTASSIUM CHLORIDE 20 MEQ PO PACK: 40.0000 meq | PACK | Freq: Two times a day (BID) | ORAL | Status: DC

## 2021-08-14 MED ORDER — WARFARIN SODIUM 5 MG PO TABS: 5.0000 mg | ORAL_TABLET | ORAL | Status: DC

## 2021-08-14 MED ORDER — DILTIAZEM HCL 60 MG PO TABS
60.0000 mg | ORAL_TABLET | Freq: Two times a day (BID) | ORAL | Status: DC
  Administered 2021-08-14 – 2021-08-16 (×4): 60 mg via ORAL
  Filled 2021-08-14 (×4): qty 1

## 2021-08-14 MED ORDER — METOPROLOL TARTRATE 25 MG PO TABS
100.0000 mg | ORAL_TABLET | Freq: Once | ORAL | Status: AC
Start: 2021-08-14 — End: 2021-08-14
  Administered 2021-08-14: 100 mg via ORAL
  Filled 2021-08-14: qty 4

## 2021-08-14 MED ORDER — ALBUTEROL SULFATE (2.5 MG/3ML) 0.083% IN NEBU: 2.5000 mg | INHALATION_SOLUTION | Freq: Four times a day (QID) | RESPIRATORY_TRACT | Status: DC | PRN

## 2021-08-14 MED ORDER — SODIUM CHLORIDE 0.9% FLUSH
3.0000 mL | Freq: Two times a day (BID) | INTRAVENOUS | Status: DC
  Administered 2021-08-14 – 2021-08-15 (×3): 3 mL via INTRAVENOUS

## 2021-08-14 MED ORDER — SODIUM CHLORIDE 0.9 % IV SOLN: INTRAVENOUS | Status: AC

## 2021-08-14 MED ORDER — ACETAMINOPHEN 650 MG RE SUPP: 650.0000 mg | Freq: Four times a day (QID) | RECTAL | Status: DC | PRN

## 2021-08-14 MED ORDER — METOPROLOL TARTRATE 25 MG PO TABS: 100.0000 mg | ORAL_TABLET | Freq: Two times a day (BID) | ORAL | Status: DC

## 2021-08-14 MED ORDER — ACETAMINOPHEN 325 MG PO TABS
650.0000 mg | ORAL_TABLET | Freq: Four times a day (QID) | ORAL | Status: DC | PRN
  Administered 2021-08-15 – 2021-08-16 (×2): 650 mg via ORAL
  Filled 2021-08-14 (×2): qty 2

## 2021-08-14 MED ORDER — SODIUM CHLORIDE 0.9 % IV SOLN: INTRAVENOUS | Status: DC

## 2021-08-14 MED ORDER — METOPROLOL TARTRATE 100 MG PO TABS
100.0000 mg | ORAL_TABLET | Freq: Two times a day (BID) | ORAL | Status: DC
  Administered 2021-08-14 – 2021-08-16 (×4): 100 mg via ORAL
  Filled 2021-08-14 (×4): qty 1

## 2021-08-14 MED ORDER — SODIUM CHLORIDE 0.9 % IV BOLUS (SEPSIS)
500.0000 mL | Freq: Once | INTRAVENOUS | Status: AC
  Administered 2021-08-14: 500 mL via INTRAVENOUS

## 2021-08-14 MED ORDER — HYDRALAZINE HCL 20 MG/ML IJ SOLN
10.0000 mg | INTRAMUSCULAR | Status: DC | PRN
  Administered 2021-08-15 (×2): 10 mg via INTRAVENOUS
  Filled 2021-08-14 (×2): qty 1

## 2021-08-14 MED ORDER — WARFARIN - PHARMACIST DOSING INPATIENT: Freq: Every day | Status: DC

## 2021-08-14 MED ORDER — POTASSIUM CHLORIDE 20 MEQ PO PACK
40.0000 meq | PACK | Freq: Two times a day (BID) | ORAL | Status: AC
  Administered 2021-08-14 (×2): 40 meq via ORAL
  Filled 2021-08-14 (×2): qty 2

## 2021-08-14 MED ORDER — ONDANSETRON HCL 4 MG/2ML IJ SOLN: 4.0000 mg | Freq: Four times a day (QID) | INTRAMUSCULAR | Status: DC | PRN

## 2021-08-14 NOTE — Progress Notes (Signed)
ANTICOAGULATION CONSULT NOTE - Initial Consult  Pharmacy Consult for Warfarin Indication: atrial fibrillation  Allergies  Allergen Reactions   Amoxil [Amoxicillin] Diarrhea   Celebrex [Celecoxib] Other (See Comments)    Bruising Stomach pain    Patient Measurements: Height: 4\' 11"  (149.9 cm) Weight: 60.8 kg (134 lb) IBW/kg (Calculated) : 43.2   Vital Signs: Temp: 97.7 F (36.5 C) (07/27 1006) Temp Source: Oral (07/27 1006) BP: 149/87 (07/27 1122) Pulse Rate: 61 (07/27 1122)  Labs: Recent Labs    08/13/21 1442 08/14/21 1020 08/14/21 1229  HGB  --  11.5*  --   HCT  --  37.1  --   PLT  --  259  --   APTT  --  45*  --   LABPROT  --  25.1*  --   INR 3.2* 2.3*  --   CREATININE  --  1.66*  --   TROPONINIHS  --  38* 33*    Estimated Creatinine Clearance: 21.1 mL/min (A) (by C-G formula based on SCr of 1.66 mg/dL (H)).   Medical History: Past Medical History:  Diagnosis Date   Allergy    Hyperlipidemia    Hypertension    Neuromuscular disorder (HCC)    Venous insufficiency     Medications:  (Not in a hospital admission)  Scheduled:   diltiazem  60 mg Oral BID   metoprolol tartrate  100 mg Oral BID   potassium chloride  40 mEq Oral BID   sodium chloride flush  3 mL Intravenous Q12H   Infusions:   sodium chloride      Assessment: 15 yof with a history of AF on warfarin, depression, anxiety, HLD, HTN, venous insufficiency, CKD. Patient presenting with fatigue and feeling unwell since June hospitalization. Warfarin per pharmacy consult placed for AF.  Warfarin dose at home 5 mg (5 mg x 1) every Mon, Wed, Fri; 2.5 mg (5 mg x 0.5) all other days. Last taken 08/13/21 per patient.  PT/INR today is 25.1/2.3 which is therapeutic. Hgb 11.5; PLT 259  Goal of Therapy:  INR 2-3 Monitor platelets by anticoagulation protocol: Yes   Plan:  Resume home warfarin regimen 5 mg MWF; 2.5 mg TuThSaSu Monitor for DDIs, bleeding, daily INR & CBC  08/15/21,  PharmD, BCPS 08/14/2021 1:40 PM ED Clinical Pharmacist -  [phone]

## 2021-08-14 NOTE — ED Triage Notes (Signed)
Pt BIB [facility] from home due to altered mental status per husband.  Ongoing for a month.  Pt husband concerned for stroke and stated to EMS she takes longer than usual to respond.  Pt normally on 2L of oxygen nasal cannula.

## 2021-08-14 NOTE — ED Notes (Signed)
Pt currently eating lunch

## 2021-08-14 NOTE — ED Notes (Signed)
Patient transported to X-ray 

## 2021-08-14 NOTE — Plan of Care (Signed)

## 2021-08-14 NOTE — H&P (Signed)
History and Physical    Patient: Brandi [name] HEN:[id number] DOB: 1940-12-03 DOA: 08/14/2021 DOS: the patient was seen and examined on 08/14/2021 PCP: Gwenyth Bender, MD  Patient coming from: Home with husband and son via EMS  Chief Complaint:  Chief Complaint  Patient presents with   Altered Mental Status   HPI: Brandi [name] is a 81 y.o. female with medical history significant of hypertension, hyperlipidemia, paroxysmal atrial fibrillation on Coumadin, chronic kidney disease stage IIIb ,anxiety, and depression who presents with due to altered mental status.  Patient had just recent been hospitalized 6/19-6/24 for acute respiratory failure with hypoxia due to community-acquired pneumonia. She was discharged home on 2 liters of oxygen.  He reports that she just is not seem to improve since getting home.  For last couple weeks she has had issues with her memory.  He also notes that the patient complains of having no appetite, is not eating or drinking much at all, and has lost weight.  When EMS came this morning she could not remember the year or her birthdate.  Previously, patient never had issues with these kinds of things.  She states she is felt tired and had a headache that is on the front left side for the last 3 to 4 days. . Denies having any fever, chills, shortness of breath, chest pain dysuria, abdominal pain, or diarrhea,  On admission into the emergency department patient was noted to be afebrile with pulse of 61-103, blood pressures 149/87 to 165/90, and O2 saturation maintained on room air.  CT scan of the head showed no acute abnormality.  Labs noted an 11.5, potassium 3.2, BUN 32, creatinine 1.66, TSH 1.427, INR 2.3 and high-sensitivity troponin 38.  Patient had been given normal saline 500 mL bolus fluids, metoprolol 100 mg p.o., and potassium chloride 40 mEq.  Urinalysis was still pending.  Review of Systems: As mentioned in the history of present illness. All other systems reviewed  and are negative. Past Medical History:  Diagnosis Date   Allergy    Hyperlipidemia    Hypertension    Neuromuscular disorder (HCC)    Venous insufficiency    Past Surgical History:  Procedure Laterality Date   ANKLE SURGERY Left    KNEE SURGERY Left    VARICOSE VEIN SURGERY     Social History:  reports that she has never smoked. She has never used smokeless tobacco. She reports that she does not drink alcohol and does not use drugs.  Allergies  Allergen Reactions   Amoxil [Amoxicillin] Diarrhea   Celebrex [Celecoxib] Other (See Comments)    Bruising Stomach pain    Family History  Problem Relation Age of Onset   Heart attack Father     Prior to Admission medications   Medication Sig Start Date End Date Taking? Authorizing Provider  acetaminophen (TYLENOL) 500 MG tablet Take 500-1,000 mg by mouth every 6 (six) hours as needed for mild pain.    [provider]  azelastine (ASTELIN) 0.1 % nasal spray Place 1 spray into both nostrils daily. 06/07/19   [provider]  cetirizine (ZYRTEC) 10 MG tablet Take 10 mg by mouth daily as needed for allergies.    [provider]  diltiazem (CARDIZEM) 60 MG tablet Take 1 tablet (60 mg total) by mouth 2 (two) times daily. 07/12/21 07/12/22  Leroy Sea, MD  ferrous sulfate 325 (65 FE) MG tablet Take 325 mg by mouth in the morning.    [provider]  guanFACINE (TENEX) 2 MG tablet Take 4 mg by mouth at bedtime.    [provider]  Lidocaine HCl (ASPERCREME LIDOCAINE) 4 % LIQD Apply 1 Application topically 2 (two) times daily as needed (knee pain).    [provider]  metoprolol tartrate (LOPRESSOR) 100 MG tablet Take 1 tablet (100 mg total) by mouth 2 (two) times daily. 07/12/21   Leroy Sea, MD  Multiple Vitamins-Minerals (CENTRUM SILVER 50+WOMEN) TABS Take 1 tablet by mouth every evening.    [provider]  Multiple Vitamins-Minerals (EYE HEALTH PO) Take 1 tablet by  mouth 2 (two) times a week. Wednesday, Sunday    [provider]  Omega-3 Fatty Acids (FISH OIL PO) Take 1 capsule by mouth every evening.    [provider]  pantoprazole (PROTONIX) 40 MG tablet Take 1 tablet (40 mg total) by mouth daily. 07/12/21   Leroy Sea, MD  Propylene Glycol (SYSTANE COMPLETE PF OP) Place 1 drop into both eyes 3 (three) times daily.    [provider]  rosuvastatin (CRESTOR) 20 MG tablet Take 10 mg by mouth 2 (two) times a week. 10 mg on Wednesday, Sunday evening 10/10/19   [provider]  sertraline (ZOLOFT) 50 MG tablet Take 75 mg by mouth every evening.    [provider]  warfarin (COUMADIN) 5 MG tablet Take 1 tablet (5 mg total) by mouth daily. 07/17/21   Antonieta Iba, MD    Physical Exam: Vitals:   08/14/21 1006 08/14/21 1008 08/14/21 1122  BP: (!) 165/90  (!) 149/87  Pulse: (!) 103  61  Resp: 16  11  Temp: 97.7 F (36.5 C)    TempSrc: Oral    SpO2: 100%  100%  Weight:  60.8 kg   Height:  4\' 11"  (1.499 m)    Exam  Constitutional: Elderly female currently in no acute distress Eyes: PERRL, lids and conjunctivae normal ENMT: Mucous membranes are moist. Posterior pharynx clear of any exudate or lesions.  Neck: normal, supple, no masses, no thyromegaly Respiratory: clear to auscultation bilaterally, no wheezing, no crackles. Normal respiratory effort.  Patient on 2 L nasal cannula oxygen with O2 saturations maintained. Cardiovascular: Irregular irregular.. No extremity edema. 2+ pedal pulses. No carotid bruits.  Abdomen: no tenderness, no masses palpated. No hepatosplenomegaly. Bowel sounds positive.  Musculoskeletal: no clubbing / cyanosis.  Healed surgical scar of the left ankle. Skin: no rashes, lesions, ulcers. No induration Neurologic: CN 2-12 grossly intact. Strength 5/5 in all 4.  Psychiatric: Normal judgment and insight. Alert and oriented x person and place  Data Reviewed:  Atrial  fibrillation 114 bpm PVCs.  Assessment and Plan:  Acute kidney injury superimposed on chronic kidney disease Patient presents with creatinine 1.66 with BUN 32.  Baseline creatinine previously been 1.28 on 6/24. -Admit to a medical telemetry bed -Follow-up urinalysis -Normal saline IV fluids at 75 mL/h x 1 day -Recheck kidney function in a.m.  Acute metabolic encephalopathy Patient husband reports that she had been more confused having difficulty with her memory over the last couple of weeks.  TSH within normal limits at 1.427.  CT scan of the head negative for any acute abnormality.  Question if this is related to dehydration or possible infection. -Neurochecks -May warrant further evaluation with MRI.  Generalized weakness CT scan of the head did not note any acute abnormality. -PT/OT to eval and treat  Elevated troponin Chronic.  High-sensitivity troponin 38.  During hospitalization last month noted to  be 66->45.  EKG appears similar to prior hospitalization. -Follow-up repeat troponin  Hypokalemia Acute.  Potassium initially 3.2.  Patient had been ordered 40 mEq of potassium chloride p.o. twice daily. -Recheck potassium levels in am.  Paroxysmal atrial fibrillation on chronic anticoagulation Heart rates initially elevated 114.  She she had just recently been switched from Eliquis to Coumadin.  Currently with INR 2.3. CHA2DS2-VASc score = 4. -Continue Coumadin per pharmacy -Continue diltiazem   Essential hypertension Med reconciliation not completed but it appears   Coreg 12.5 mg was just recently prescribed this month.  Husband did not see this medication in her things however.  At this time  will continue the metoprolol as previously prescribed.  Records from her cardiologist also appear to show interval loss of medication patient should be on. -Continue Cardizem and metoprolol   Normocytic anemia Chronic.  Hemoglobin 11.5 g/dL which appears improved from prior. -Recheck  CBC tomorrow morning  Hyperlipidemia -Resume home medications once verified  DVT prophylaxis: Coumadin Advance Care Planning:   Code Status: Partial Code.  No CPR.  Consults: None  Family Communication: Husband updated at bedside Severity of Illness: The appropriate patient status for this patient is OBSERVATION. Observation status is judged to be reasonable and necessary in order to provide the required intensity of service to ensure the patient's safety. The patient's presenting symptoms, physical exam findings, and initial radiographic and laboratory data in the context of their medical condition is felt to place them at decreased risk for further clinical deterioration. Furthermore, it is anticipated that the patient will be medically stable for discharge from the hospital within 2 midnights of admission.   Author: Norval Morton, MD 08/14/2021 12:07 PM  For on call review [email or web address].

## 2021-08-14 NOTE — Progress Notes (Signed)
   08/14/21 1854  What Happened  Was fall witnessed? No  Was patient injured? Yes  Patient found on floor  Found by Staff-comment  Stated prior activity ambulating-unassisted  Follow Up  MD notified Katrinka Blazing  Time MD notified [phone]  Family notified Yes - comment  Time family notified 1901  Additional tests No  Simple treatment Dressing  Progress note created (see row info) Yes  Adult Fall Risk Assessment  Risk Factor Category (scoring not indicated) High fall risk per protocol (document High fall risk)  Age 81  Fall History: Fall within 6 months prior to admission 5  Elimination; Bowel and/or Urine Incontinence 0  Elimination; Bowel and/or Urine Urgency/Frequency 0  Medications: includes PCA/Opiates, Anti-convulsants, Anti-hypertensives, Diuretics, Hypnotics, Laxatives, Sedatives, and Psychotropics 5  Patient Care Equipment 2  Mobility-Assistance 2  Mobility-Gait 2  Mobility-Sensory Deficit 0  Altered awareness of immediate physical environment 0  Impulsiveness 2  Lack of understanding of one's physical/cognitive limitations 0  Total Score 21  Patient Fall Risk Level High fall risk  Adult Fall Risk Interventions  Required Bundle Interventions *See Row Information* High fall risk - low, moderate, and high requirements implemented  Additional Interventions Use of appropriate toileting equipment (bedpan, BSC, etc.)  Screening for Fall Injury Risk (To be completed on HIGH fall risk patients) - Assessing Need for Floor Mats  Risk For Fall Injury- Criteria for Floor Mats Previous fall this admission  Neurological  Neuro (WDL) X  Level of Consciousness Alert  Orientation Level Oriented to person;Oriented to place;Oriented to time  Cognition Appropriate at baseline;Memory impairment  Speech Clear  Glasgow Coma Scale  Eye Opening 4  Best Verbal Response (NON-intubated) 5  Best Motor Response 6  Musculoskeletal  Musculoskeletal (WDL) X  Generalized Weakness Yes  Integumentary   Integumentary (WDL) X  Skin Integrity Scratch Marks

## 2021-08-14 NOTE — ED Provider Notes (Signed)
[facility] EMERGENCY DEPARTMENT Provider Note   CSN: [id number] Arrival date & time:        History  Chief Complaint  Patient presents with   Altered Mental Status      Brandi [name] is a 81 y.o. female. 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  Per EMS patient's husband called EMS who brought patient here today to the ER for increasing confusion and concerned that maybe she had a stroke due to prolonged periods of response.  According to patient, she says her husband had spoken to her PCP who had called in today to check up on her since her recent hospitalization.  She is unsure what he told the doctor but she is here now.  She does note since her recent discharge from the hospital she has been having persistent fatigue and generally feeling unwell.  She says she still remaining on oxygen despite previously not being on oxygen.  She also notes decreased p.o. intake both fluids and solids.  She generally has no appetite.  However she denies any persistent fevers, no persistent cough, no vomiting, no diarrhea, no hematochezia or melena, no chest pain, no abdominal pain, no urinary symptoms, no shortness of breath.  She denies any focal weakness or paresthesias.  She did have a recent fall about a week ago but just landed on her bottom when trying to load the laundry but did not hit her head or lose consciousness.  She was recently switched from Eliquis to warfarin. She has been ambulating with a cane.   Altered Mental Status      Home Medications Prior to Admission medications   Medication Sig Start Date End Date Taking? Authorizing Provider  acetaminophen (TYLENOL) 500 MG tablet Take  500-1,000 mg by mouth every 6 (six) hours as needed for mild pain.    [provider]  azelastine (ASTELIN) 0.1 % nasal spray Place 1 spray into both nostrils daily. 06/07/19   [provider]  cetirizine (ZYRTEC) 10 MG tablet Take 10 mg by mouth daily as needed for allergies.    [provider]  diltiazem (CARDIZEM) 60 MG tablet Take 1 tablet (60 mg total) by mouth 2 (two) times daily. 07/12/21 07/12/22  Leroy Sea, MD  ferrous sulfate 325 (65 FE) MG tablet Take 325 mg by mouth in the morning.    [provider]  guanFACINE (TENEX) 2 MG tablet Take 4 mg by mouth at bedtime.    [provider]  Lidocaine HCl (ASPERCREME LIDOCAINE) 4 % LIQD Apply 1 Application topically 2 (two) times daily as needed (knee pain).    [provider]  metoprolol tartrate (LOPRESSOR) 100 MG tablet Take 1 tablet (100 mg total) by mouth 2 (two) times daily. 07/12/21   Leroy Sea, MD  Multiple Vitamins-Minerals (CENTRUM SILVER 50+WOMEN) TABS Take 1 tablet by mouth every evening.    [provider]  Multiple Vitamins-Minerals (EYE HEALTH PO) Take 1 tablet by mouth 2 (two) times a week. Wednesday, Sunday    [provider]  Omega-3 Fatty Acids (FISH OIL PO) Take 1 capsule by mouth every evening.    [provider]  pantoprazole (PROTONIX) 40 MG tablet Take 1 tablet (40 mg total) by mouth daily.  07/12/21   Leroy Sea, MD  Propylene Glycol (SYSTANE COMPLETE PF OP) Place 1 drop into both eyes 3 (three) times daily.    [provider]  rosuvastatin (CRESTOR) 20 MG tablet Take 10 mg by mouth 2 (two) times a week. 10 mg on Wednesday, Sunday evening 10/10/19   [provider]  sertraline (ZOLOFT) 50 MG tablet Take 75 mg by mouth every evening.    [provider]  warfarin (COUMADIN) 5 MG tablet Take 1 tablet (5 mg total) by mouth daily. 07/17/21   Antonieta Iba, MD      Allergies    Amoxil [amoxicillin] and  Celebrex [celecoxib]    Review of Systems   Review of Systems  Physical Exam Updated Vital Signs BP (!) 149/87 (BP Location: Right Arm)   Pulse 61   Temp 97.7 F (36.5 C) (Oral)   Resp 11   Ht 4\' 11"  (1.499 m)   Wt 60.8 kg   SpO2 100%   BMI 27.06 kg/m  Physical Exam Constitutional: Alert and oriented.  Nontoxic, NAD, AOx4 Eyes: Conjunctivae are normal. ENT      Head: Normocephalic and atraumatic.      Nose: No congestion.      Mouth/Throat: Mucous membranes are moist.      Neck: No stridor. Cardiovascular: Tachycardic, irregularly irregular rhythm, warm and well-perfused, equal palpable distal and proximal pulses Respiratory: Normal respiratory effort. Breath sounds are normal.  O2 sat greater than 95% on 1 L nasal cannula Gastrointestinal: Soft and nontender. There is no CVA tenderness. Musculoskeletal: Normal range of motion in all extremities.      Right lower leg: No tenderness or edema.      Left lower leg: No tenderness or edema. Neurologic: Normal speech and language.  Equal strength of bilateral upper and lower extremities, moving all extremities equally.  Normal sensation.  No facial droop.  No gross focal neurologic deficits are appreciated. Skin: Skin is warm, dry and intact. No rash noted. Psychiatric: Mood and affect are normal. Speech and behavior are normal.  ED Results / Procedures / Treatments   Labs (all labs ordered are listed, but only abnormal results are displayed) Labs Reviewed  COMPREHENSIVE METABOLIC PANEL - Abnormal; Notable for the following components:      Result Value   Potassium 3.2 (*)    BUN 32 (*)    Creatinine, Ser 1.66 (*)    GFR, Estimated 31 (*)    All other components within normal limits  CBC WITH DIFFERENTIAL/PLATELET - Abnormal; Notable for the following components:   Hemoglobin 11.5 (*)    All other components within normal limits  PROTIME-INR - Abnormal; Notable for the following components:   Prothrombin Time 25.1 (*)     INR 2.3 (*)    All other components within normal limits  APTT - Abnormal; Notable for the following components:   aPTT 45 (*)    All other components within normal limits  TROPONIN I (HIGH SENSITIVITY) - Abnormal; Notable for the following components:   Troponin I (High Sensitivity) 38 (*)    All other components within normal limits  MAGNESIUM  TSH  URINALYSIS, ROUTINE W REFLEX MICROSCOPIC  TROPONIN I (HIGH SENSITIVITY)    EKG EKG Interpretation  Date/Time:  Thursday August 14 2021 10:07:35 EDT Ventricular Rate:  114 PR Interval:    QRS Duration: 81 QT Interval:  341 QTC Calculation: 470 R Axis:   -19 Text Interpretation: Atrial fibrillation Ventricular premature complex LVH with  secondary repolarization abnormality Inferior infarct, old Anterior infarct, old has had similar rhythm on prior EKG Confirmed by Eber Hong ([id number]) on 08/14/2021 10:10:00 AM  Radiology CT Head Wo Contrast  Result Date: 08/14/2021 CLINICAL DATA:  81 year old female with altered mental status. EXAM: CT HEAD WITHOUT CONTRAST TECHNIQUE: Contiguous axial images were obtained from the base of the skull through the vertex without intravenous contrast. RADIATION DOSE REDUCTION: This exam was performed according to the departmental dose-optimization program which includes automated exposure control, adjustment of the mA and/or kV according to patient size and/or use of iterative reconstruction technique. COMPARISON:  None Available. FINDINGS: Brain: No evidence of acute infarction, hemorrhage, hydrocephalus, acute extra-axial collection or mass lesion/mass effect. Atrophy identified. Moderate white matter hypodensities are noted likely representing chronic small-vessel white matter ischemic changes. Vascular: Carotid and vertebral atherosclerotic calcifications are noted. Skull: Normal. Negative for fracture or focal lesion. Sinuses/Orbits: Visualized portions of the RIGHT maxillary sinus and a few RIGHT ethmoid air  cells are opacified. Calcified concretions within the RIGHT maxillary sinus are noted. Other: None. IMPRESSION: 1. No evidence of acute intracranial abnormality. 2. Atrophy and probable chronic small-vessel white matter ischemic changes. 3. RIGHT maxillary and ethmoid sinusitis, likely chronic. Electronically Signed   By: Harmon Pier M.D.   On: 08/14/2021 11:08   DG Chest 2 View  Result Date: 08/14/2021 CLINICAL DATA:  Recent pneumonia.  Altered mental status. EXAM: CHEST - 2 VIEW COMPARISON:  Radiographs 07/19/2018, 07/11/2021 and 10/29/2020. FINDINGS: The heart size and mediastinal contours are stable with aortic atherosclerosis and tortuosity. There is further improvement in the streaky right perihilar and left basilar airspace opacities compared with the prior examination, consistent with resolving pneumonia. There is chronic central airway thickening and scattered scarring. No new airspace disease, significant pleural effusion or pneumothorax. Midthoracic compression deformities are grossly stable. IMPRESSION: Further improvement in bilateral airspace opacities consistent with resolving pneumonia. No evidence of acute cardiopulmonary process. Electronically Signed   By: Carey Bullocks M.D.   On: 08/14/2021 10:57    Procedures Procedures  Remained on constant cardiac and pulse ox monitoring.  Noted to be in A-fib RVR.  Medications Ordered in ED Medications  sodium chloride 0.9 % bolus 500 mL (500 mLs Intravenous New Bag/Given 08/14/21 1120)  potassium chloride (KLOR-CON) packet 40 mEq (has no administration in time range)  metoprolol tartrate (LOPRESSOR) tablet 100 mg (100 mg Oral Given 08/14/21 1118)    ED Course/ Medical Decision Making/ A&P Clinical Course as of 08/14/21 1208  Thu Aug 14, 2021  1109 Independent interpretation of chest x-ray consistent with improving pneumonia.  CT head on independent interpretation with no evidence of ICH.  INR within normal limits 2.3 on Coumadin.  Stable  improving anemia hemoglobin 11.5.  Normal white blood cell count 7.1. [VB]  1137 CT head confirmed no ICH or acute abnormality.  She does have AKI creatinine 1.66 up from baseline 1.2 likely prerenal in nature with elevated BUN.  Mild hypokalemia 3.2.  Ordered for repletion, currently receiving IV fluids.  Troponin 38 down trended from prior with no active chest pain, suspect demand ischemia in the setting of dehydration.  Will page for admission for AKI, hypokalemia, FTT. [VB]  1205 Spoke with Dr Katrinka Blazing hospitalist regarding admission. Will come down to evaluate patient and put in orders. [VB]    Clinical Course User Index [VB] Mardene Sayer, MD  Medical Decision Making Colin Norment is a 81 y.o. female. 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  Patient presents AAO x4 and neurologically intact slightly hypertensive 165/90 and A-fib RVR ranging from 110s to 140s.  No respiratory distress satting greater than 95% on 1 L nasal cannula.   Patient describes overall weakness or fatigue, rather than any particular focal weakness in the setting of recent hospitalization for AKI and pneumonia.   Regarding patient's generalized weakness, differential is broad and includes but is not limited to acute electrolyte disturbances (such as hypokalemia, hypoglycemia, hypoMg, hypoCa) or AKI in setting of decreased po intake, thyroid disturbance, infection, atypical ACS, anemia, dehydration among multiple other etiologies. Intact neurologic exam and chronic nature of symptoms make CNS etiology such as stroke, tumor unlikely.  However, since reported confusion from husband and being on Coumadin, will pursue  CTH.  Cardiac etiologies such as arrhythmia or atypical ACS will be evaluated with EKG and troponin; anemia will be ruled out with CBC; infection will be ruled out with basic infectious workup (CXR, urinalysis); acute electrolyte abnormalities ruled out with serum studies, CTH to eval for ICH or other abnormality.   Plan to obtain CBC, Chem 10, Mg, coags, TSH, EKG, troponin, UA, CXR, CTH. Will give patient IVF and home metoprolol 100 mg for A fib RVR since patient has not taken any home meds. Disposition pending workup and reassessment. Suspect admission especially if persistent A fib RVR.  Discussion of Management with other Physicians, QHP or Appropriate Source: hospitalist Dr Katrinka Blazing Independent Interpretation of Studies: EKG: A-fib RVR with inferior lead T wave inversions similar to previous with new mild ST depression lateral leads V5 V6..; RAD : CTH no ICH  External Records Reviewed: Previous admission note June 2023 for AKI and pneumonia requiring oxygen. Escalation of Care, Consideration of Admission/Observation/Transfer: Admit Social determinants that significantly affected care: limited access to transport Prescription drug(s) considered but not prescribed: none Diagnostic tests considered but not performed: none History obtained from other sources: EMS/spouse      Amount and/or Complexity of Data Reviewed Labs: ordered. Radiology: ordered.  Risk Prescription drug management. Decision regarding hospitalization.     Final Clinical Impression(s) / ED Diagnoses Final diagnoses:  Confusion  Atrial fibrillation with RVR (HCC)  AKI (acute kidney injury) (HCC)  Hypokalemia    Rx / DC Orders ED Discharge Orders     None         Mardene Sayer, MD 08/14/21 1208

## 2021-08-15 ENCOUNTER — Observation Stay (HOSPITAL_COMMUNITY): Payer: Medicare Other

## 2021-08-15 DIAGNOSIS — N189 Chronic kidney disease, unspecified: Secondary | ICD-10-CM | POA: Diagnosis not present

## 2021-08-15 DIAGNOSIS — N179 Acute kidney failure, unspecified: Secondary | ICD-10-CM | POA: Diagnosis not present

## 2021-08-15 LAB — CBC
HCT: 33.7 % — ABNORMAL LOW (ref 36.0–46.0)
Hemoglobin: 10.2 g/dL — ABNORMAL LOW (ref 12.0–15.0)
MCH: 27.2 pg (ref 26.0–34.0)
MCHC: 30.3 g/dL (ref 30.0–36.0)
MCV: 89.9 fL (ref 80.0–100.0)
Platelets: 245 10*3/uL (ref 150–400)
RBC: 3.75 MIL/uL — ABNORMAL LOW (ref 3.87–5.11)
RDW: 14.3 % (ref 11.5–15.5)
WBC: 7.6 10*3/uL (ref 4.0–10.5)
nRBC: 0 % (ref 0.0–0.2)

## 2021-08-15 LAB — BASIC METABOLIC PANEL
Anion gap: 9 (ref 5–15)
BUN: 28 mg/dL — ABNORMAL HIGH (ref 8–23)
CO2: 25 mmol/L (ref 22–32)
Calcium: 9.1 mg/dL (ref 8.9–10.3)
Chloride: 105 mmol/L (ref 98–111)
Creatinine, Ser: 1.49 mg/dL — ABNORMAL HIGH (ref 0.44–1.00)
GFR, Estimated: 35 mL/min — ABNORMAL LOW (ref >60)
Glucose, Bld: 91 mg/dL (ref 70–99)
Potassium: 4.5 mmol/L (ref 3.5–5.1)
Sodium: 139 mmol/L (ref 135–145)

## 2021-08-15 LAB — PROTIME-INR
INR: 2.9 — ABNORMAL HIGH (ref 0.8–1.2)
Prothrombin Time: 30.2 seconds — ABNORMAL HIGH (ref 11.4–15.2)

## 2021-08-15 LAB — MAGNESIUM: Magnesium: 2 mg/dL (ref 1.7–2.4)

## 2021-08-15 MED ORDER — WARFARIN SODIUM 2.5 MG PO TABS: 2.5000 mg | ORAL_TABLET | Freq: Once | ORAL | Status: DC

## 2021-08-15 MED ORDER — WARFARIN SODIUM 3 MG PO TABS
3.0000 mg | ORAL_TABLET | Freq: Once | ORAL | Status: AC
  Administered 2021-08-15: 3 mg via ORAL
  Filled 2021-08-15: qty 1

## 2021-08-15 NOTE — Evaluation (Signed)
Physical Therapy Evaluation Patient Details Name: Brandi [name] MRN: [id number] DOB: [birth year] Today's Date: 08/15/2021  History of Present Illness  81 y.o. female presents to [facility] hospital on 08/14/2021 with AMS, AKI and generalized weakness. PMH includes HTN, HLD, PAF, CKDIII, anxiety, depression.  Clinical Impression  Pt presents to PT with deficits in balance, cognition, gait, functional mobility. Pt reports weakness and instability with attempts at mobilizing with cane, demonstrating improved balance with BUE support of walker. Pt with impaired memory and disoriented to time during session, showing periods of confusion throughout. Pt will benefit from aggressive mobilization with staff assistance in an effort to improve strength and balance. PT recommends HHPT at the time of discharge to aide in restoring the patient's prior level of function.       Recommendations for follow up therapy are one component of a multi-disciplinary discharge planning process, led by the attending physician.  Recommendations may be updated based on patient status, additional functional criteria and insurance authorization.  Follow Up Recommendations Home health PT      Assistance Recommended at Discharge Intermittent Supervision/Assistance  Patient can return home with the following  A little help with walking and/or transfers;Help with stairs or ramp for entrance;Assistance with cooking/housework;A little help with bathing/dressing/bathroom    Equipment Recommendations None recommended by PT (pt reports owning necessary DME, recommend use of RW for ambulation initially upon discharge)  Recommendations for Other Services       Functional Status Assessment Patient has had a recent decline in their functional status and demonstrates the ability to make significant improvements in function in a reasonable and predictable amount of time.     Precautions / Restrictions Precautions Precautions: Fall Precaution  Comments: fall on 7/27 during admission Restrictions Weight Bearing Restrictions: No      Mobility  Bed Mobility               General bed mobility comments: received and left sitting at the edge of bed    Transfers Overall transfer level: Needs assistance Equipment used: Straight cane Transfers: Sit to/from Stand Sit to Stand: Min guard                Ambulation/Gait Ambulation/Gait assistance: Supervision, Min guard Gait Distance (Feet): 150 Feet (initial 3' with cane, then 150' with RW) Assistive device: Rolling walker (2 wheels) Gait Pattern/deviations: Step-through pattern Gait velocity: reduced Gait velocity interpretation: <1.8 ft/sec, indicate of risk for recurrent falls   General Gait Details: pt with slowed step-through gait, no LOB with walker. high guard and shuffling steps with initial 2-3 steps with cane prior to switching to [employer]    Modified Rankin (Stroke Patients Only)       Balance Overall balance assessment: Needs assistance Sitting-balance support: No upper extremity supported, Feet supported Sitting balance-Leahy Scale: Good     Standing balance support: Bilateral upper extremity supported, Reliant on assistive device for balance Standing balance-Leahy Scale: Poor                               Pertinent Vitals/Pain Pain Assessment Pain Assessment: No/denies pain    Home Living Family/patient expects to be discharged to:: Private residence Living Arrangements: Spouse/significant other Available Help at Discharge: Family;Available 24 hours/day Type of Home: House Home Access: Stairs to enter Entrance Stairs-Rails: Right Entrance Stairs-Number of Steps: 4   Home Layout:  Multi-level;Able to live on main level with bedroom/bathroom Home Equipment: Cane - single point;Rolling Walker (2 wheels)      Prior Function Prior Level of Function : Independent/Modified  Independent;Driving             Mobility Comments: ambulating with use of cane       Hand Dominance   Dominant Hand: Right    Extremity/Trunk Assessment   Upper Extremity Assessment Upper Extremity Assessment: Overall WFL for tasks assessed    Lower Extremity Assessment Lower Extremity Assessment: Generalized weakness    Cervical / Trunk Assessment Cervical / Trunk Assessment: Kyphotic  Communication   Communication: No difficulties  Cognition Arousal/Alertness: Awake/alert Behavior During Therapy: WFL for tasks assessed/performed Overall Cognitive Status: Impaired/Different from baseline Area of Impairment: Orientation, Memory, Safety/judgement                 Orientation Level: Disoriented to, Time   Memory: Decreased short-term memory, Decreased recall of precautions (falls precautions)   Safety/Judgement: Decreased awareness of safety, Decreased awareness of deficits              General Comments General comments (skin integrity, edema, etc.): VSS on RA    Exercises     Assessment/Plan    PT Assessment Patient needs continued PT services  PT Problem List Decreased balance;Decreased mobility;Decreased cognition;Decreased knowledge of precautions;Decreased safety awareness;Decreased knowledge of use of DME       PT Treatment Interventions DME instruction;Gait training;Stair training;Functional mobility training;Therapeutic activities;Therapeutic exercise;Balance training;Neuromuscular re-education;Patient/family education;Cognitive remediation    PT Goals (Current goals can be found in the Care Plan section)  Acute Rehab PT Goals Patient Stated Goal: to go home as soon as possible PT Goal Formulation: With patient Time For Goal Achievement: 08/29/21 Potential to Achieve Goals: Good Additional Goals Additional Goal #1: Pt will score >45/56 on the BERG to indicate a reduced risk for falls    Frequency Min 3X/week     Co-evaluation                AM-PAC PT "6 Clicks" Mobility  Outcome Measure Help needed turning from your back to your side while in a flat bed without using bedrails?: A Little Help needed moving from lying on your back to sitting on the side of a flat bed without using bedrails?: A Little Help needed moving to and from a bed to a chair (including a wheelchair)?: A Little Help needed standing up from a chair using your arms (e.g., wheelchair or bedside chair)?: A Little Help needed to walk in hospital room?: A Little Help needed climbing 3-5 steps with a railing? : A Lot 6 Click Score: 17    End of Session   Activity Tolerance: Patient tolerated treatment well Patient left: in bed;with call bell/phone within reach;with bed alarm set Nurse Communication: Mobility status PT Visit Diagnosis: Other abnormalities of gait and mobility (R26.89);History of falling (Z91.81)    Time: 9485-4627 PT Time Calculation (min) (ACUTE ONLY): 15 min   Charges:   PT Evaluation $PT Eval Low Complexity: 1 Low          Arlyss Gandy, PT, DPT Acute Rehabilitation Office [phone]   Arlyss Gandy 08/15/2021, 1:49 PM

## 2021-08-15 NOTE — Progress Notes (Signed)
Mobility Specialist Progress Note:   08/15/21 1016  Mobility  Activity Ambulated with assistance in room;Ambulated with assistance to bathroom  Level of Assistance Standby assist, set-up cues, supervision of patient - no hands on  Assistive Device Front wheel walker  Distance Ambulated (ft) 40 ft  Activity Response Tolerated well  $Mobility charge 1 Mobility   Pt received EOB asking to go to the bathroom. No complaints of pain. Left in bed with call bell in reach and all needs met.   [facility] Emoree Sasaki Mobility Specialist

## 2021-08-15 NOTE — Progress Notes (Signed)
ANTICOAGULATION CONSULT NOTE - follow-up  Pharmacy Consult for Warfarin Indication: atrial fibrillation  Allergies  Allergen Reactions   Amoxil [Amoxicillin] Diarrhea   Celebrex [Celecoxib] Other (See Comments)    Bruising and stomach pain    Patient Measurements: Height: 4\' 11"  (149.9 cm) Weight: 58 kg (127 lb 12.8 oz) IBW/kg (Calculated) : 43.2   Vital Signs: Temp: 98.6 F (37 C) (07/28 0735) Temp Source: Oral (07/28 0735) BP: 128/68 (07/28 0735) Pulse Rate: 73 (07/28 0735)  Labs: Recent Labs    08/13/21 1442 08/14/21 1020 08/14/21 1229 08/15/21 0216  HGB  --  11.5*  --  10.2*  HCT  --  37.1  --  33.7*  PLT  --  259  --  245  APTT  --  45*  --   --   LABPROT  --  25.1*  --  30.2*  INR 3.2* 2.3*  --  2.9*  CREATININE  --  1.66*  --  1.49*  TROPONINIHS  --  38* 33*  --      Estimated Creatinine Clearance: 23 mL/min (A) (by C-G formula based on SCr of 1.49 mg/dL (H)).   Medical History: Past Medical History:  Diagnosis Date   Allergy    Hyperlipidemia    Hypertension    Neuromuscular disorder (HCC)    Venous insufficiency     Medications:  Medications Prior to Admission  Medication Sig Dispense Refill Last Dose   acetaminophen (TYLENOL) 500 MG tablet Take 1,000 mg by mouth every 6 (six) hours as needed for mild pain.   Past Week   azelastine (ASTELIN) 0.1 % nasal spray Place 2 sprays into both nostrils daily.   unk   carvedilol (COREG) 12.5 MG tablet Take 12.5 mg by mouth 2 (two) times daily.   SEE NOTE   cetirizine (ZYRTEC) 10 MG tablet Take 10 mg by mouth daily as needed for allergies.   unk   diltiazem (CARDIZEM) 60 MG tablet Take 1 tablet (60 mg total) by mouth 2 (two) times daily. 60 tablet 0 unk   ferrous sulfate 325 (65 FE) MG tablet Take 325 mg by mouth daily with breakfast.   unk   guanFACINE (TENEX) 2 MG tablet Take 4 mg by mouth at bedtime.   unk   Lidocaine HCl (ASPERCREME LIDOCAINE) 4 % LIQD Apply 1 Application topically 2 (two) times  daily as needed (knee pain).   unk   metoprolol tartrate (LOPRESSOR) 100 MG tablet Take 1 tablet (100 mg total) by mouth 2 (two) times daily. 60 tablet 0 SEE NOTE   Omega-3 Fatty Acids (FISH OIL) 1000 MG CAPS Take 1,000 mg by mouth daily.   unk   OXYGEN Inhale 2 L/min into the lungs continuous.   Continuous at Continuous   pantoprazole (PROTONIX) 40 MG tablet Take 1 tablet (40 mg total) by mouth daily. (Patient taking differently: Take 40 mg by mouth daily before breakfast.) 30 tablet 0 unk   sertraline (ZOLOFT) 50 MG tablet Take 75 mg by mouth at bedtime.   unk   Multiple Vitamins-Minerals (CENTRUM SILVER 50+WOMEN) TABS Take 1 tablet by mouth every evening.   unk   Multiple Vitamins-Minerals (EYE HEALTH PO) Take 1 tablet by mouth 2 (two) times a week. Wednesday, Sunday   unk   Propylene Glycol (SYSTANE COMPLETE PF OP) Place 1 drop into both eyes 3 (three) times daily.   unk   rosuvastatin (CRESTOR) 20 MG tablet Take 10 mg by mouth See admin instructions. Take 10  mg by mouth in the evening on Sun/Wed only (twice a week)   unk   warfarin (COUMADIN) 5 MG tablet Take 1 tablet (5 mg total) by mouth daily. (Patient not taking: Reported on 08/14/2021) 30 tablet 0 Not Taking   Scheduled:   diltiazem  60 mg Oral BID   metoprolol tartrate  100 mg Oral BID   sodium chloride flush  3 mL Intravenous Q12H   warfarin  2.5 mg Oral Once per day on Sun Tue Thu Sat   warfarin  5 mg Oral Q M,W,F   Warfarin - Pharmacist Dosing Inpatient   Does not apply q1600   Infusions:   sodium chloride 75 mL/hr at 08/14/21 1358    Assessment: 81 yof with a history of AF on warfarin, depression, anxiety, HLD, HTN, venous insufficiency, CKD. Patient presenting with fatigue and feeling unwell since June hospitalization. INR was 3.2 on 08/13/2021 (PTA). Warfarin per pharmacy consult placed for AF.  Warfarin dose at home 5 mg every Mon, Wed, Fri; 2.5 mg all other days.  Last taken 08/13/21 per patient (2.5 mg dose).  INR  2.9 Hgb 10.2; PLT 245  Goal of Therapy:  INR 2-3 Monitor platelets by anticoagulation protocol: Yes   Plan:  Coumadin 3 mg today Monitor for DDIs, bleeding, daily INR & CBC  Athens Lebeau BS, PharmD, BCPS Clinical Pharmacist 08/15/2021 8:46 AM  Contact: [phone] after 3 PM  "Be curious, not judgmental..." -Debbora Dus

## 2021-08-15 NOTE — Progress Notes (Signed)
PROGRESS NOTE    Brandi [name]  [id number] DOB: 10-14-1940 DOA: 08/14/2021 PCP: Rogers Blocker, MD  Outpatient Specialists:     Brief Narrative:  As per H&P done on admission: "Brandi [name] is a 81 y.o. female with medical history significant of hypertension, hyperlipidemia, paroxysmal atrial fibrillation on Coumadin, chronic kidney disease stage IIIb ,anxiety, and depression who presents with due to altered mental status.   Patient had just recent been hospitalized 6/19-6/24 for acute respiratory failure with hypoxia due to community-acquired pneumonia. She was discharged home on 2 liters of oxygen.  He reports that she just is not seem to improve since getting home.  For last couple weeks she has had issues with her memory.  He also notes that the patient complains of having no appetite, is not eating or drinking much at all, and has lost weight.  When EMS came this morning she could not remember the year or her birthdate.  Previously, patient never had issues with these kinds of things.  She states she is felt tired and had a headache that is on the front left side for the last 3 to 4 days. . Denies having any fever, chills, shortness of breath, chest pain dysuria, abdominal pain, or diarrhea,   On admission into the emergency department patient was noted to be afebrile with pulse of 61-103, blood pressures 149/87 to 165/90, and O2 saturation maintained on room air.  CT scan of the head showed no acute abnormality.  Labs noted an 11.5, potassium 3.2, BUN 32, creatinine 1.66, TSH 1.427, INR 2.3 and high-sensitivity troponin 38.  Patient had been given normal saline 500 mL bolus fluids, metoprolol 100 mg p.o., and potassium chloride 40 mEq.  Urinalysis was still pending".  08/15/2021, patient is not particularly good historian due to advanced dementia.  We will proceed with urine culture and MRI of the brain.  Complete work-up.   Assessment & Plan:   Principal Problem:   Acute kidney injury  superimposed on chronic kidney disease ([place]) Active Problems:   Acute metabolic encephalopathy   Generalized weakness   Troponin level elevated   Hypokalemia   Long term (current) use of anticoagulants   Paroxysmal atrial fibrillation (HCC)   Normocytic anemia    Chronic kidney disease stage IIIb:  -Stable. -Continue to monitor renal function and electrolytes closely.    Acute metabolic encephalopathy Patient husband reports that she had been more confused having difficulty with her memory over the last couple of weeks.  TSH within normal limits at 1.427.  CT scan of the head negative for any acute abnormality.  Question if this is related to dehydration or possible infection. -Neurochecks -May warrant further evaluation with MRI. 08/15/2021: Urine culture.  MRI brain.   Generalized weakness CT scan of the head did not note any acute abnormality. -PT/OT to eval and treat   Elevated troponin Chronic.  High-sensitivity troponin 38.  During hospitalization last month noted to be 66->45.  EKG appears similar to prior hospitalization. -Follow-up repeat troponin   Hypokalemia Resolved.   Paroxysmal atrial fibrillation on chronic anticoagulation Heart rates initially elevated 114.  She she had just recently been switched from Eliquis to Coumadin.  Currently with INR 2.3. CHA2DS2-VASc score = 4. -Continue Coumadin per pharmacy -Continue diltiazem    Essential hypertension Med reconciliation not completed but it appears   Coreg 12.5 mg was just recently prescribed this month.  Husband did not see this medication in her things however.  At this time  will continue the metoprolol as previously prescribed.  Records from her cardiologist also appear to show interval loss of medication patient should be on. -Continue Cardizem and metoprolol    Normocytic anemia Chronic.  Hemoglobin 11.5 g/dL which appears improved from prior. -Recheck CBC tomorrow morning   Hyperlipidemia -Resume home  medications once verified   DVT prophylaxis: Coumadin Code Status: Partial code Family Communication:  Disposition Plan: Will depend on hospital course   Consultants:  None  Procedures:  None  Antimicrobials:  None   Subjective: No significant history from patient.  Objective: Vitals:   08/15/21 0500 08/15/21 0735 08/15/21 1200 08/15/21 1233  BP: 121/73 128/68 (!) 188/86 126/65  Pulse:  73    Resp: 17 12    Temp:  98.6 F (37 C)    TempSrc:  Oral    SpO2:  95%    Weight:      Height:        Intake/Output Summary (Last 24 hours) at 08/15/2021 1246 Last data filed at 08/15/2021 0259 Gross per 24 hour  Intake 200 ml  Output 300 ml  Net -100 ml   Filed Weights   08/14/21 1008 08/15/21 0000  Weight: 60.8 kg 58 kg    Examination:  General exam: Appears calm and comfortable  Respiratory system: Clear to auscultation.  Cardiovascular system: S1 & S2 heard Gastrointestinal system: Abdomen is obese, soft and nontender.   Central nervous system: Awake.  Moves all extremities.   Extremities: Mild ankle edema    Data Reviewed: I have personally reviewed following labs and imaging studies  CBC: Recent Labs  Lab 08/14/21 1020 08/15/21 0216  WBC 7.1 7.6  NEUTROABS 3.7  --   HGB 11.5* 10.2*  HCT 37.1 33.7*  MCV 90.0 89.9  PLT 259 245   Basic Metabolic Panel: Recent Labs  Lab 08/14/21 1020 08/15/21 0216  NA 139 139  K 3.2* 4.5  CL 101 105  CO2 27 25  GLUCOSE 94 91  BUN 32* 28*  CREATININE 1.66* 1.49*  CALCIUM 9.9 9.1  MG 2.1 2.0   GFR: Estimated Creatinine Clearance: 23 mL/min (A) (by C-G formula based on SCr of 1.49 mg/dL (H)). Liver Function Tests: Recent Labs  Lab 08/14/21 1020  AST 23  ALT 17  ALKPHOS 80  BILITOT 0.4  PROT 7.4  ALBUMIN 3.6   No results for input(s): "LIPASE", "AMYLASE" in the last 168 hours. No results for input(s): "AMMONIA" in the last 168 hours. Coagulation Profile: Recent Labs  Lab 08/13/21 1442  08/14/21 1020 08/15/21 0216  INR 3.2* 2.3* 2.9*   Cardiac Enzymes: No results for input(s): "CKTOTAL", "CKMB", "CKMBINDEX", "TROPONINI" in the last 168 hours. BNP (last 3 results) No results for input(s): "PROBNP" in the last 8760 hours. HbA1C: No results for input(s): "HGBA1C" in the last 72 hours. CBG: No results for input(s): "GLUCAP" in the last 168 hours. Lipid Profile: No results for input(s): "CHOL", "HDL", "LDLCALC", "TRIG", "CHOLHDL", "LDLDIRECT" in the last 72 hours. Thyroid Function Tests: Recent Labs    08/14/21 1020  TSH 1.427   Anemia Panel: No results for input(s): "VITAMINB12", "FOLATE", "FERRITIN", "TIBC", "IRON", "RETICCTPCT" in the last 72 hours. Urine analysis:    Component Value Date/Time   COLORURINE STRAW (A) 08/14/2021 1330   APPEARANCEUR CLEAR 08/14/2021 1330   LABSPEC 1.004 (L) 08/14/2021 1330   PHURINE 6.0 08/14/2021 1330   GLUCOSEU NEGATIVE 08/14/2021 1330   HGBUR SMALL (A) 08/14/2021 1330   BILIRUBINUR NEGATIVE 08/14/2021 1330  KETONESUR NEGATIVE 08/14/2021 1330   PROTEINUR 100 (A) 08/14/2021 1330   NITRITE NEGATIVE 08/14/2021 1330   LEUKOCYTESUR MODERATE (A) 08/14/2021 1330   Sepsis Labs: @LABRCNTIP (procalcitonin:4,lacticidven:4)  )No results found for this or any previous visit (from the past 240 hour(s)).       Radiology Studies: CT Head Wo Contrast  Result Date: 08/14/2021 CLINICAL DATA:  81 year old female with altered mental status. EXAM: CT HEAD WITHOUT CONTRAST TECHNIQUE: Contiguous axial images were obtained from the base of the skull through the vertex without intravenous contrast. RADIATION DOSE REDUCTION: This exam was performed according to the departmental dose-optimization program which includes automated exposure control, adjustment of the mA and/or kV according to patient size and/or use of iterative reconstruction technique. COMPARISON:  None Available. FINDINGS: Brain: No evidence of acute infarction, hemorrhage,  hydrocephalus, acute extra-axial collection or mass lesion/mass effect. Atrophy identified. Moderate white matter hypodensities are noted likely representing chronic small-vessel white matter ischemic changes. Vascular: Carotid and vertebral atherosclerotic calcifications are noted. Skull: Normal. Negative for fracture or focal lesion. Sinuses/Orbits: Visualized portions of the RIGHT maxillary sinus and a few RIGHT ethmoid air cells are opacified. Calcified concretions within the RIGHT maxillary sinus are noted. Other: None. IMPRESSION: 1. No evidence of acute intracranial abnormality. 2. Atrophy and probable chronic small-vessel white matter ischemic changes. 3. RIGHT maxillary and ethmoid sinusitis, likely chronic. Electronically Signed   By: 94 M.D.   On: 08/14/2021 11:08   DG Chest 2 View  Result Date: 08/14/2021 CLINICAL DATA:  Recent pneumonia.  Altered mental status. EXAM: CHEST - 2 VIEW COMPARISON:  Radiographs 07/19/2018, 07/11/2021 and 10/29/2020. FINDINGS: The heart size and mediastinal contours are stable with aortic atherosclerosis and tortuosity. There is further improvement in the streaky right perihilar and left basilar airspace opacities compared with the prior examination, consistent with resolving pneumonia. There is chronic central airway thickening and scattered scarring. No new airspace disease, significant pleural effusion or pneumothorax. Midthoracic compression deformities are grossly stable. IMPRESSION: Further improvement in bilateral airspace opacities consistent with resolving pneumonia. No evidence of acute cardiopulmonary process. Electronically Signed   By: 12/29/2020 M.D.   On: 08/14/2021 10:57        Scheduled Meds:  diltiazem  60 mg Oral BID   metoprolol tartrate  100 mg Oral BID   sodium chloride flush  3 mL Intravenous Q12H   warfarin  3 mg Oral ONCE-1600   Warfarin - Pharmacist Dosing Inpatient   Does not apply q1600   Continuous Infusions:   sodium chloride 75 mL/hr at 08/14/21 1358     LOS: 0 days    Time spent: 55 minutes    08/16/21, MD  [facility] Pager #: [phone] 7PM-7AM contact night coverage as above

## 2021-08-16 DIAGNOSIS — N179 Acute kidney failure, unspecified: Secondary | ICD-10-CM | POA: Diagnosis not present

## 2021-08-16 DIAGNOSIS — N189 Chronic kidney disease, unspecified: Secondary | ICD-10-CM | POA: Diagnosis not present

## 2021-08-16 LAB — RENAL FUNCTION PANEL
Albumin: 3.2 g/dL — ABNORMAL LOW (ref 3.5–5.0)
Anion gap: 9 (ref 5–15)
BUN: 22 mg/dL (ref 8–23)
CO2: 25 mmol/L (ref 22–32)
Calcium: 9.2 mg/dL (ref 8.9–10.3)
Chloride: 105 mmol/L (ref 98–111)
Creatinine, Ser: 1.3 mg/dL — ABNORMAL HIGH (ref 0.44–1.00)
GFR, Estimated: 41 mL/min — ABNORMAL LOW (ref >60)
Glucose, Bld: 107 mg/dL — ABNORMAL HIGH (ref 70–99)
Phosphorus: 2.8 mg/dL (ref 2.5–4.6)
Potassium: 3.7 mmol/L (ref 3.5–5.1)
Sodium: 139 mmol/L (ref 135–145)

## 2021-08-16 LAB — CBC WITH DIFFERENTIAL/PLATELET
Abs Immature Granulocytes: 0.04 10*3/uL (ref 0.00–0.07)
Basophils Absolute: 0.1 10*3/uL (ref 0.0–0.1)
Basophils Relative: 1 %
Eosinophils Absolute: 0.2 10*3/uL (ref 0.0–0.5)
Eosinophils Relative: 2 %
HCT: 34.6 % — ABNORMAL LOW (ref 36.0–46.0)
Hemoglobin: 11 g/dL — ABNORMAL LOW (ref 12.0–15.0)
Immature Granulocytes: 1 %
Lymphocytes Relative: 20 %
Lymphs Abs: 1.6 10*3/uL (ref 0.7–4.0)
MCH: 28.2 pg (ref 26.0–34.0)
MCHC: 31.8 g/dL (ref 30.0–36.0)
MCV: 88.7 fL (ref 80.0–100.0)
Monocytes Absolute: 0.8 10*3/uL (ref 0.1–1.0)
Monocytes Relative: 10 %
Neutro Abs: 5.3 10*3/uL (ref 1.7–7.7)
Neutrophils Relative %: 66 %
Platelets: 256 10*3/uL (ref 150–400)
RBC: 3.9 MIL/uL (ref 3.87–5.11)
RDW: 14.5 % (ref 11.5–15.5)
WBC: 8 10*3/uL (ref 4.0–10.5)
nRBC: 0 % (ref 0.0–0.2)

## 2021-08-16 LAB — URINE CULTURE

## 2021-08-16 LAB — MAGNESIUM: Magnesium: 1.9 mg/dL (ref 1.7–2.4)

## 2021-08-16 LAB — PROTIME-INR
INR: 2.4 — ABNORMAL HIGH (ref 0.8–1.2)
Prothrombin Time: 26.3 seconds — ABNORMAL HIGH (ref 11.4–15.2)

## 2021-08-16 MED ORDER — SODIUM CHLORIDE 0.9 % IV SOLN
1.0000 g | INTRAVENOUS | Status: DC
  Administered 2021-08-16: 1 g via INTRAVENOUS
  Filled 2021-08-16: qty 10

## 2021-08-16 MED ORDER — WARFARIN SODIUM 2 MG PO TABS: 2.0000 mg | ORAL_TABLET | Freq: Every day | ORAL | 0 refills | Status: DC

## 2021-08-16 MED ORDER — CEPHALEXIN 500 MG PO CAPS: 500.0000 mg | ORAL_CAPSULE | Freq: Four times a day (QID) | ORAL | 0 refills | Status: AC

## 2021-08-16 NOTE — Progress Notes (Signed)
IV and tele removed, patient alert and oriented x4. D/c instruction explain to the patient husband, all questions answer. Family verbalized understanding. Pt. D/c per order

## 2021-08-16 NOTE — Evaluation (Signed)
Occupational Therapy Evaluation Patient Details Name: Brandi [name] MRN: [id number] DOB: 1940/05/09 Today's Date: 08/16/2021   History of Present Illness 81 y.o. female presents to [facility] hospital on 08/14/2021 with AMS, AKI and generalized weakness. PMH includes HTN, HLD, PAF, CKDIII, anxiety, depression.   Clinical Impression   Pt at this time noted to take increase time but was able to report name, they were in [facility], [place] and [place]. Pt at this time was able to complete UE ADLS set up in sitting and LB ADLS with min to mod assist. Pt needs increase in time to process information and following cues. Pt currently with functional limitations due to the deficits listed below (see OT Problem List).  Pt will benefit from skilled OT to increase their safety and independence with ADL and functional mobility for ADL to facilitate discharge to venue listed below.        Recommendations for follow up therapy are one component of a multi-disciplinary discharge planning process, led by the attending physician.  Recommendations may be updated based on patient status, additional functional criteria and insurance authorization.   Follow Up Recommendations  Home health OT    Assistance Recommended at Discharge Frequent or constant Supervision/Assistance  Patient can return home with the following A little help with walking and/or transfers;A little help with bathing/dressing/bathroom;Assistance with cooking/housework;Direct supervision/assist for medications management;Direct supervision/assist for financial management;Assist for transportation    Functional Status Assessment  Patient has had a recent decline in their functional status and demonstrates the ability to make significant improvements in function in a reasonable and predictable amount of time.  Equipment Recommendations  None recommended by OT    Recommendations for Other Services       Precautions / Restrictions Precautions Precautions:  Fall Precaution Comments: fall on 7/27 during admission Restrictions Weight Bearing Restrictions: No      Mobility Bed Mobility Overal bed mobility: Needs Assistance Bed Mobility: Supine to Sit     Supine to sit: Modified independent (Device/Increase time), HOB elevated          Transfers Overall transfer level: Needs assistance Equipment used: Rolling walker (2 wheels) Transfers: Sit to/from Stand Sit to Stand: Min guard                  Balance Overall balance assessment: Needs assistance Sitting-balance support: No upper extremity supported, Feet supported Sitting balance-Leahy Scale: Good     Standing balance support: Bilateral upper extremity supported, Reliant on assistive device for balance Standing balance-Leahy Scale: Fair Standing balance comment: Pt was able to compete oral care while standing at the sink                           ADL either performed or assessed with clinical judgement   ADL Overall ADL's : Needs assistance/impaired Eating/Feeding: Independent;Sitting   Grooming: Wash/dry hands;Wash/dry face;Oral care;Min guard;Cueing for safety;Cueing for sequencing;Standing   Upper Body Bathing: Set up;Sitting   Lower Body Bathing: Moderate assistance;Cueing for safety;Cueing for sequencing;Sit to/from stand   Upper Body Dressing : Set up;Sitting   Lower Body Dressing: Moderate assistance;Sit to/from stand   Toilet Transfer: Nurse, adult (2 wheels)   Toileting- Architect and Hygiene: Min guard;Cueing for safety;Cueing for sequencing;Sit to/from stand       Functional mobility during ADLs: Min guard;Rolling walker (2 wheels)       Vision         Perception     Praxis  Pertinent Vitals/Pain Pain Assessment Pain Assessment: No/denies pain     Hand Dominance Right   Extremity/Trunk Assessment Upper Extremity Assessment Upper Extremity Assessment: Overall WFL for tasks assessed    Lower Extremity Assessment Lower Extremity Assessment: Defer to PT evaluation   Cervical / Trunk Assessment Cervical / Trunk Assessment: Kyphotic   Communication Communication Communication: No difficulties   Cognition Arousal/Alertness: Awake/alert Behavior During Therapy: WFL for tasks assessed/performed Overall Cognitive Status: Impaired/Different from baseline Area of Impairment: Memory, Following commands, Awareness, Safety/judgement, Problem solving                     Memory: Decreased recall of precautions, Decreased short-term memory Following Commands: Follows one step commands consistently Safety/Judgement: Decreased awareness of safety, Decreased awareness of deficits           General Comments       Exercises     Shoulder Instructions      Home Living Family/patient expects to be discharged to:: Private residence Living Arrangements: Spouse/significant other Available Help at Discharge: Family;Available 24 hours/day Type of Home: House Home Access: Stairs to enter [employer] of Steps: 4 Entrance Stairs-Rails: Right Home Layout: Multi-level;Able to live on main level with bedroom/bathroom               Home Equipment: Cane - single point;Rolling Walker (2 wheels)          Prior Functioning/Environment Prior Level of Function : Independent/Modified Independent;Driving             Mobility Comments: ambulating with use of cane ADLs Comments: independent        OT Problem List: Decreased strength;Impaired balance (sitting and/or standing);Decreased activity tolerance;Decreased safety awareness;Decreased cognition      OT Treatment/Interventions: Self-care/ADL training;Therapeutic exercise;Therapeutic activities;Patient/family education;Balance training    OT Goals(Current goals can be found in the care plan section) Acute Rehab OT Goals Patient Stated Goal: To move more OT Goal Formulation: With patient Time For  Goal Achievement: 08/30/21 Potential to Achieve Goals: Good ADL Goals Pt Will Perform Upper Body Bathing: Independently;sitting;standing Pt Will Perform Lower Body Bathing: with modified independence;sit to/from stand Pt Will Transfer to Toilet: with modified independence;ambulating Pt Will Perform Tub/Shower Transfer: with min guard assist;ambulating;shower seat  OT Frequency: Min 2X/week    Co-evaluation              AM-PAC OT "6 Clicks" Daily Activity     Outcome Measure Help from another person eating meals?: None Help from another person taking care of personal grooming?: A Little Help from another person toileting, which includes using toliet, bedpan, or urinal?: A Little Help from another person bathing (including washing, rinsing, drying)?: A Lot Help from another person to put on and taking off regular upper body clothing?: A Little Help from another person to put on and taking off regular lower body clothing?: A Lot 6 Click Score: 17   End of Session Equipment Utilized During Treatment: Gait belt;Rolling walker (2 wheels)  Activity Tolerance: Patient tolerated treatment well Patient left: in chair;with call bell/phone within reach;with chair alarm set  OT Visit Diagnosis: Unsteadiness on feet (R26.81);Other abnormalities of gait and mobility (R26.89);Muscle weakness (generalized) (M62.81);History of falling (Z91.81)                Time: 4098-1191 OT Time Calculation (min): 33 min Charges:  OT General Charges $OT Visit: 1 Visit OT Evaluation $OT Eval Low Complexity: 1 Low OT Treatments $Self Care/Home Management : 8-22 mins  Alphia Moh OTR/L  Acute Rehab Services  [phone] office number [phone] pager number   Alphia Moh 08/16/2021, 10:01 AM

## 2021-08-16 NOTE — TOC Transition Note (Signed)
Transition of Care [facility]) - CM/SW Discharge Note   Patient Details  Name: Brandi [name] MRN: [id number] Date of Birth: March 09, 1940  Transition of Care [facility]) CM/SW Contact:  Lawerance Sabal, RN Phone Number: 08/16/2021, 9:41 AM   Clinical Narrative:   [name] w patient at bedside. She does not have a preference for [employer] [facility] [employer] services.  [facility] accepted for [facility] Monday. Liaison aware she will need INR drawn and that MD name and number has been added to order. [facility] has access to Epic and will pull needed information electronically. Patient states that her son will pick her up. Nurse notified HH is set up. No other TOC needs identified for DC    Final next level of care: Home w Home Health Services Barriers to Discharge: No Barriers Identified   Patient Goals and CMS Choice Patient states their goals for this hospitalization and ongoing recovery are:: to go home CMS Medicare.gov Compare Post Acute Care list provided to:: Patient Choice offered to / list presented to : Patient  Discharge Placement                       Discharge Plan and Services                DME Arranged: N/A         HH Arranged: PT, RN [facility] Agency: [facility] Date HH Agency Contacted: 08/16/21 Time HH Agency Contacted: [phone] Representative spoke with at [facility] Agency: Melissa  Social Determinants of Health (SDOH) Interventions     Readmission Risk Interventions     No data to display

## 2021-08-20 ENCOUNTER — Telehealth: Payer: Self-pay | Admitting: Cardiovascular Disease

## 2021-08-20 ENCOUNTER — Ambulatory Visit (INDEPENDENT_AMBULATORY_CARE_PROVIDER_SITE_OTHER): Payer: Medicare Other

## 2021-08-20 DIAGNOSIS — Z7901 Long term (current) use of anticoagulants: Secondary | ICD-10-CM | POA: Diagnosis not present

## 2021-08-20 DIAGNOSIS — I484 Atypical atrial flutter: Secondary | ICD-10-CM

## 2021-08-20 LAB — POCT INR: INR: 3.8 — AB (ref 2.0–3.0)

## 2021-08-20 NOTE — Telephone Encounter (Signed)
Called and spoke with Harriett Sine, home health RN.   Harriett Sine reports that patient's husband is requesting that pt have her PT/INR checked at home until she is a little stronger. Home health RN reports that she is the one that will be doing this and will forward results to [place].   Harriett Sine confirmed that patient is to have her next check on 8/9 and stated she will go out to the patient's home that morning and do a PT/INR check.

## 2021-08-20 NOTE — Patient Instructions (Signed)
HOLD TONIGHT ONLY and then DECREASE TO  0.5 tablet Daily.  INR in 1 week.  [phone]

## 2021-08-20 NOTE — Telephone Encounter (Signed)
Brandi [name], home health nurse called wanting to know if it would be okay to have patient PT/INR done at home.  It's a little hard for her husband to take patient out to get it check as patient is currently using oxygen. PCP had set up it for home health nurse to check it. Would Dr. Mariah Milling be okay with this?

## 2021-08-21 NOTE — Telephone Encounter (Signed)
Spoke with home health nurse Harriett Sine and she stated that she just wanted to make sure we were okay with the 1.5 lb weight gain in 24 hours. She did state that she thinks it was because the patient just started eating. I advised that she let us know if it is a weight gain of 3 lbs in a 24 hour period or 5 pounds in a week, or if patient is exhibiting increased signs of edema, or SOB.   Harriett Sine is requesting to have a PT/INR order for her to be able to check this at home, is requesting an order asap.  I informed her that I would let Dr. Mariah Milling and his nurse know, and that someone would reach back out to her tomorrow.

## 2021-08-21 NOTE — Telephone Encounter (Signed)
Home Health caller stated she will be doing the weekly blood draws for this third period.  Caller reported the patient had weight gain of 1.5 lbs in 24 hours and has trace of edema in her ankles.  Caller requested call back to follow-up.

## 2021-08-22 NOTE — Telephone Encounter (Signed)
Called and spoke with Harriett Sine.   Order provided for PT/INR checks while pt is under going home PT to get strong enough to come into office.

## 2021-08-26 ENCOUNTER — Telehealth: Payer: Self-pay | Admitting: Cardiovascular Disease

## 2021-08-26 NOTE — Telephone Encounter (Signed)
Sigurd Sos, RN     Sorry..  I only do POCT finger stick testing.  I have no idea what she needs to do with blood draw.  She will need to contact her supervisor for further advisement.   Thank you    Called Harriett Sine and advised her of above.   Harriett Sine said that she's going to take the sample to [facility] near patient's home and write STAT on lab slip.

## 2021-08-26 NOTE — Telephone Encounter (Signed)
Caller stated she will be doing a PT/INR visit with this patient tomorrow but does not have INR strips.  They were ordered but had not come in yet.  She stated she can draw blood for the patient but needs to know where she needs to take the blood sample.

## 2021-08-27 ENCOUNTER — Ambulatory Visit (INDEPENDENT_AMBULATORY_CARE_PROVIDER_SITE_OTHER): Payer: Medicare Other | Admitting: Cardiology

## 2021-08-27 DIAGNOSIS — Z7901 Long term (current) use of anticoagulants: Secondary | ICD-10-CM | POA: Diagnosis not present

## 2021-08-27 LAB — PROTIME-INR: INR: 5.5 — AB (ref <=1.20)

## 2021-08-27 NOTE — Telephone Encounter (Signed)
Call transferred directly to this RN.  Shea Evans- [facility], RN ([facility]) Phone: [phone] Fax: [phone]  Calling with Critical PT/ INR resutls: 08/27/21: PT- 50.5 INR- 5.5  On Warfarin 2.5 mg Mon/ Wed/ Fri & 5 mg Tues/ Thurs/ Sat/ Sun  Per Harriett Sine, please call the patient's husband, Rakeya Glab at [phone] with new dosing as the patient will not be able to comprehend the orders.  Harriett Sine advised she is scheduled to see the patient on Wednesdays, but if she needs a redraw lab sooner, we will need to fax the order to her at the # listed above and just call to let her know.   To [facility] to follow up.

## 2021-08-27 NOTE — Telephone Encounter (Signed)
Brandi [name] is following up, to report critical lab results. Transferred to [employer], Charity fundraiser.

## 2021-09-03 ENCOUNTER — Ambulatory Visit (INDEPENDENT_AMBULATORY_CARE_PROVIDER_SITE_OTHER): Payer: Medicare Other

## 2021-09-03 DIAGNOSIS — Z7901 Long term (current) use of anticoagulants: Secondary | ICD-10-CM

## 2021-09-03 DIAGNOSIS — I484 Atypical atrial flutter: Secondary | ICD-10-CM

## 2021-09-03 LAB — POCT INR: INR: 2.1 (ref 2.0–3.0)

## 2021-09-03 NOTE — Discharge Summary (Signed)
Physician Discharge Summary   Patient: Brandi [name] MRN: [id number] DOB: 07-01-1940  Admit date:     08/14/2021  Discharge date: 08/16/2021  Discharge Physician: Barnetta Chapel   PCP: Gwenyth Bender, MD   Recommendations at discharge:   Follow-up with primary care provider within 1 week of discharge. PCP to consider referring patient to a nephrologist.  Patient has chronic kidney disease stage IIIb. PCP to consider repeating urinalysis and urine culture if symptoms of UTI are noted  Discharge Diagnoses: Principal Problem:   Acute kidney injury superimposed on chronic kidney disease (HCC) Active Problems:   Acute metabolic encephalopathy   Generalized weakness   Troponin level elevated   Hypokalemia   Long term (current) use of anticoagulants   Paroxysmal atrial fibrillation (HCC)   Normocytic anemia Bacteruria    Hospital Course: Patient is an 81 year old female with past medical history significant for advanced dementia, hypertension, hyperlipidemia, paroxysmal atrial fibrillation on Coumadin, chronic kidney disease stage IIIb, anxiety and depression.  Patient was admitted to telemetry with altered mental status.  Patient was admitted for further work-up and management.  Urinalysis revealed many bacteria, however, urine culture grew multiple organisms.  Patient was volume depleted.  Patient was adequately hydrated.  CT scan of the head came back negative.  MRI of the brain was also negative.  Patient is back to baseline now be discharged back to the care of the primary care provider.  Assessment and Plan: Chronic kidney disease stage IIIb:  -Stable. -Continue to monitor renal function and electrolytes closely.     Acute metabolic encephalopathy Patient husband reports that she had been more confused having difficulty with her memory over the last couple of weeks.  TSH within normal limits at 1.427.  CT scan of the head negative for any acute abnormality.  Question if this is  related to dehydration or possible infection. -Neurochecks -May warrant further evaluation with MRI. 08/15/2021: Urine culture grew multiple organisms.  MRI brain was negative.   Generalized weakness CT scan of the head did not note any acute abnormality. -PT/OT to eval and treat   Elevated troponin Chronic.  High-sensitivity troponin 38.  During hospitalization last month noted to be 66->45.  EKG appears similar to prior hospitalization. -Follow-up repeat troponin   Hypokalemia Resolved.   Paroxysmal atrial fibrillation on chronic anticoagulation Heart rates initially elevated 114.  She she had just recently been switched from Eliquis to Coumadin.  Currently with INR 2.3. CHA2DS2-VASc score = 4. -Continue Coumadin per pharmacy -Continue diltiazem    Essential hypertension Med reconciliation not completed but it appears   Coreg 12.5 mg was just recently prescribed this month.  Husband did not see this medication in her things however.  At this time  will continue the metoprolol as previously prescribed.  Records from her cardiologist also appear to show interval loss of medication patient should be on. -Continue Cardizem and metoprolol    Normocytic anemia Chronic.  Hemoglobin 11.5 g/dL which appears improved from prior. -Recheck CBC tomorrow morning   Hyperlipidemia -Resume home medications once verified    Consultants: None Procedures performed: CT head was negative. Disposition: Home Diet recommendation: Cardiac/Renal. Discharge Diet Orders (From admission, onward)     Start     Ordered   08/16/21 0000  Diet - low sodium heart healthy        08/16/21 0850           Cardiac diet DISCHARGE MEDICATION: Allergies as of 08/16/2021  Reactions   Amoxil [amoxicillin] Diarrhea   Celebrex [celecoxib] Other (See Comments)   Bruising and stomach pain        Medication List     STOP taking these medications    carvedilol 12.5 MG tablet Commonly known as:  COREG   Fish Oil 1000 MG Caps   guanFACINE 2 MG tablet Commonly known as: TENEX       TAKE these medications    acetaminophen 500 MG tablet Commonly known as: TYLENOL Take 1,000 mg by mouth every 6 (six) hours as needed for mild pain.   Aspercreme Lidocaine 4 % Liqd Generic drug: Lidocaine HCl Apply 1 Application topically 2 (two) times daily as needed (knee pain).   azelastine 0.1 % nasal spray Commonly known as: ASTELIN Place 2 sprays into both nostrils daily.   Centrum Silver 50+Women Tabs Take 1 tablet by mouth every evening. What changed: Another medication with the same name was removed. Continue taking this medication, and follow the directions you see here.   cetirizine 10 MG tablet Commonly known as: ZYRTEC Take 10 mg by mouth daily as needed for allergies.   diltiazem 60 MG tablet Commonly known as: Cardizem Take 1 tablet (60 mg total) by mouth 2 (two) times daily.   ferrous sulfate 325 (65 FE) MG tablet Take 325 mg by mouth daily with breakfast.   metoprolol tartrate 100 MG tablet Commonly known as: LOPRESSOR Take 1 tablet (100 mg total) by mouth 2 (two) times daily.   OXYGEN Inhale 2 L/min into the lungs continuous.   pantoprazole 40 MG tablet Commonly known as: PROTONIX Take 1 tablet (40 mg total) by mouth daily. What changed: when to take this   rosuvastatin 20 MG tablet Commonly known as: CRESTOR Take 10 mg by mouth See admin instructions. Take 10 mg by mouth in the evening on Sun/Wed only (twice a week)   sertraline 50 MG tablet Commonly known as: ZOLOFT Take 75 mg by mouth at bedtime.   SYSTANE COMPLETE PF OP Place 1 drop into both eyes 3 (three) times daily.   warfarin 2 MG tablet Commonly known as: COUMADIN Take as directed. If you are unsure how to take this medication, talk to your nurse or doctor. Original instructions: Take 1 tablet (2 mg total) by mouth daily. What changed:  medication strength how much to take       ASK  your doctor about these medications    cephALEXin 500 MG capsule Commonly known as: KEFLEX Take 1 capsule (500 mg total) by mouth 4 (four) times daily for 4 days. Ask about: Should I take this medication?        Follow-up Information     Gwenyth Bender, MD Follow up.   Specialty: Internal Medicine Why: Please follow up in a week. Contact information: [address] [place] [address] [phone]         Antonieta Iba, MD Follow up.   Specialty: Cardiology Contact information: [address] [place] [address] [phone]         [facility] Follow up.   Specialty: Home Health Services Why: for home health nursing and PT. They will see you at home Monday or Tuesday and draw labs. Contact information: [address] [place] [address] [phone]                Discharge Exam: Filed Weights   08/14/21 1008 08/15/21 0000 08/16/21 0542  Weight:  60.8 kg 58 kg 57.4 kg     Condition at discharge: stable  The results of significant diagnostics from this hospitalization (including imaging, microbiology, ancillary and laboratory) are listed below for reference.   Imaging Studies: MR BRAIN WO CONTRAST  Result Date: 08/15/2021 CLINICAL DATA:  Altered mental status EXAM: MRI HEAD WITHOUT CONTRAST TECHNIQUE: Multiplanar, multiecho pulse sequences of the brain and surrounding structures were obtained without intravenous contrast. COMPARISON:  No prior MRI, correlation is made with CT head 08/14/2021 FINDINGS: Brain: No restricted diffusion to suggest acute or subacute infarct. No acute hemorrhage, mass, mass effect, or midline shift. No hemosiderin deposition to suggest remote hemorrhage. 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 No hydrocephalus. Ventricular size is commensurate with degree of cerebral volume loss. T2  hyperintense signal in the periventricular white matter, likely the sequela of mild-to-moderate chronic small vessel ischemic disease. Vascular: Normal arterial flow voids. Skull and upper cervical spine: Normal marrow signal. Sinuses/Orbits: Right maxillary mucosal thickening with additional less significant mucosal thickening in the right anterior ethmoid air cells. Status post bilateral lens replacements. Other: The mastoids are well aerated. IMPRESSION: No acute intracranial process. Electronically Signed   By: Merilyn Baba M.D.   On: 08/15/2021 15:07   CT Head Wo Contrast  Result Date: 08/14/2021 CLINICAL DATA:  81 year old female with altered mental status. EXAM: CT HEAD WITHOUT CONTRAST TECHNIQUE: Contiguous axial images were obtained from the base of the skull through the vertex without intravenous contrast. RADIATION DOSE REDUCTION: This exam was performed according to the departmental dose-optimization program which includes automated exposure control, adjustment of the mA and/or kV according to patient size and/or use of iterative reconstruction technique. COMPARISON:  None Available. FINDINGS: Brain: No evidence of acute infarction, hemorrhage, hydrocephalus, acute extra-axial collection or mass lesion/mass effect. Atrophy identified. Moderate white matter hypodensities are noted likely representing chronic small-vessel white matter ischemic changes. Vascular: Carotid and vertebral atherosclerotic calcifications are noted. Skull: Normal. Negative for fracture or focal lesion. Sinuses/Orbits: Visualized portions of the RIGHT maxillary sinus and a few RIGHT ethmoid air cells are opacified. Calcified concretions within the RIGHT maxillary sinus are noted. Other: None. IMPRESSION: 1. No evidence of acute intracranial abnormality. 2. Atrophy and probable chronic small-vessel white matter ischemic changes. 3. RIGHT maxillary and ethmoid sinusitis, likely chronic. Electronically Signed   By: Margarette Canada  M.D.   On: 08/14/2021 11:08   DG Chest 2 View  Result Date: 08/14/2021 CLINICAL DATA:  Recent pneumonia.  Altered mental status. EXAM: CHEST - 2 VIEW COMPARISON:  Radiographs 07/19/2018, 07/11/2021 and 10/29/2020. FINDINGS: The heart size and mediastinal contours are stable with aortic atherosclerosis and tortuosity. There is further improvement in the streaky right perihilar and left basilar airspace opacities compared with the prior examination, consistent with resolving pneumonia. There is chronic central airway thickening and scattered scarring. No new airspace disease, significant pleural effusion or pneumothorax. Midthoracic compression deformities are grossly stable. IMPRESSION: Further improvement in bilateral airspace opacities consistent with resolving pneumonia. No evidence of acute cardiopulmonary process. Electronically Signed   By: Richardean Sale M.D.   On: 08/14/2021 10:57    Microbiology: Results for orders placed or performed during the hospital encounter of 08/14/21  Urine Culture     Status: Abnormal   Collection Time: 08/15/21 12:44 PM   Specimen: Urine, Clean Catch  Result Value Ref Range Status   Specimen Description URINE, CLEAN CATCH  Final   Special Requests  Final    NONE Performed at [facility], [place] [address], [place], [place] [address]    Culture MULTIPLE SPECIES PRESENT, SUGGEST RECOLLECTION (A)  Final   Report Status 08/16/2021 FINAL  Final    Labs: CBC: No results for input(s): "WBC", "NEUTROABS", "HGB", "HCT", "MCV", "PLT" in the last 168 hours. Basic Metabolic Panel: No results for input(s): "NA", "K", "CL", "CO2", "GLUCOSE", "BUN", "CREATININE", "CALCIUM", "MG", "PHOS" in the last 168 hours. Liver Function Tests: No results for input(s): "AST", "ALT", "ALKPHOS", "BILITOT", "PROT", "ALBUMIN" in the last 168 hours. CBG: No results for input(s): "GLUCAP" in the last 168 hours.  Discharge time spent: greater than 30  minutes.  Signed: Bonnell Public, MD [facility] 09/03/2021

## 2021-09-03 NOTE — Patient Instructions (Signed)
Description   Spoke with Harriett Sine, [facility] RN while in pt's home, advised to have pt start taking 2mg  daily.  Recheck NR in 1 week with results called to [place] office.  [phone]

## 2021-09-10 ENCOUNTER — Ambulatory Visit (INDEPENDENT_AMBULATORY_CARE_PROVIDER_SITE_OTHER): Payer: Medicare Other | Admitting: Internal Medicine

## 2021-09-10 ENCOUNTER — Telehealth: Payer: Self-pay | Admitting: Cardiovascular Disease

## 2021-09-10 DIAGNOSIS — Z7901 Long term (current) use of anticoagulants: Secondary | ICD-10-CM

## 2021-09-10 LAB — POCT INR: INR: 2.7 (ref 2.0–3.0)

## 2021-09-10 NOTE — Telephone Encounter (Signed)
See anti-coag visit encounter today for further detail.

## 2021-09-10 NOTE — Telephone Encounter (Signed)
Brandi [name] from [facility] with critical INR results and she said she needs orders. Call tranferred to Sigurd Sos, RN.

## 2021-09-17 ENCOUNTER — Telehealth: Payer: Self-pay | Admitting: Cardiovascular Disease

## 2021-09-17 ENCOUNTER — Ambulatory Visit (INDEPENDENT_AMBULATORY_CARE_PROVIDER_SITE_OTHER): Payer: Medicare Other | Admitting: Cardiovascular Disease

## 2021-09-17 ENCOUNTER — Telehealth: Payer: Self-pay

## 2021-09-17 DIAGNOSIS — I484 Atypical atrial flutter: Secondary | ICD-10-CM | POA: Diagnosis not present

## 2021-09-17 DIAGNOSIS — Z7901 Long term (current) use of anticoagulants: Secondary | ICD-10-CM | POA: Diagnosis not present

## 2021-09-17 LAB — POCT INR: INR: 2.7 (ref 2.0–3.0)

## 2021-09-17 MED ORDER — WARFARIN SODIUM 2 MG PO TABS: 2.0000 mg | ORAL_TABLET | Freq: Every day | ORAL | 0 refills | Status: DC

## 2021-09-17 NOTE — Telephone Encounter (Signed)
Spoke to [place] with [facility] calling to report patient's B/P and pulse readings-  164/72,132/70,180/70,132/80,132/60,160/80, 158/80.Pulse ranging 50 to 68.  Patient just did not feel good today.No specific complaint.She takes medications as listed on medication list.I will send message to Dr.Gollan for advice.

## 2021-09-17 NOTE — Telephone Encounter (Signed)
New Message:      Harriett Sine from [facility] called. SHe wants to talk to the nurse or doctor about this patient's blood pressure and heart rate    Pt c/o BP issue: STAT if pt c/o blurred vision, one-sided weakness or slurred speech  1. What are your last 5 BP readings? 132/70 180/70 left arm   heart rate usually runs 56 to 68 today it was 50 the entire time, it did not change  2. Are you having any other symptoms (ex. Dizziness, headache, blurred vision, passed out)?  No symptoms  3. What is your BP issue? Blood pressure running high for her, no pain and heart rate is running low ;

## 2021-09-17 NOTE — Telephone Encounter (Signed)
Harriett Sine from [facility] called stating she is returning a call

## 2021-09-17 NOTE — Telephone Encounter (Signed)
Brandi [name], [facility] nurse, calling back to report new readings. Requesting call back.

## 2021-09-18 NOTE — Telephone Encounter (Signed)
Spoke with the pt [facility] nurse Harriett Sine. This is also a duplicate encounter. See other 09/17/21 telephone encounter.  Harriett Sine sts that the patient is scheduled to see her pcp Dr. August Saucer tomorrow. Nancy did provide BP and HR readings in the other encounter. Henretter Kays that Elnita Maxwell, LPN has sent those readings to Dr. Mariah Milling for him to review.

## 2021-09-18 NOTE — Telephone Encounter (Signed)
Sigurd Sos, RN  Sent: Wed September 17, 2021 12:11 PM  To: Jefferey Pica, RN          Message  I spoke to her and it relates to BP and HR..    Thank you

## 2021-09-18 NOTE — Telephone Encounter (Signed)
Spoke with the pt HH nurse Nancy. This is also a duplicate encounter. See other 09/17/21 telephone encounter.  Nancy sts that the patient is scheduled to see her pcp Dr. Dean tomorrow. Nancy did provide BP and HR readings in the other encounter. Adv Nancy that Cheryl, LPN has sent those readings to Dr. Gollan for him to review.  

## 2021-09-19 NOTE — Telephone Encounter (Signed)
Spoke w/ [facility] nurse Harriett Sine.  Advised her of Dr. Windell Hummingbird recommendation:  Mariah Milling, Tollie Pizza, MD  P Cv Div [facility] Triage Caller: Unspecified (2 days ago, 12:31 PM) By the notes it would appear blood pressure running high  Looking at my last note was only taking felodipine 5 mg once a day  Perhaps felodipine can be increased up to 5 mg twice a day  Thx  TG   She reports that pt is not currently taking felodipine at all - it was d/c'd in June 2023. She is going out today to check on pt around 10:30, she will call back w/ her findings.

## 2021-09-24 ENCOUNTER — Telehealth: Payer: Self-pay | Admitting: Cardiovascular Disease

## 2021-09-24 ENCOUNTER — Telehealth: Payer: Self-pay

## 2021-09-24 ENCOUNTER — Ambulatory Visit (INDEPENDENT_AMBULATORY_CARE_PROVIDER_SITE_OTHER): Payer: Medicare Other | Admitting: Internal Medicine

## 2021-09-24 DIAGNOSIS — Z7901 Long term (current) use of anticoagulants: Secondary | ICD-10-CM

## 2021-09-24 LAB — POCT INR: INR: 2.5 (ref 2.0–3.0)

## 2021-09-24 NOTE — Telephone Encounter (Signed)
Pt c/o medication issue:  1. Name of Medication: Guanfacine 2 MG  2. How are you currently taking this medication (dosage and times per day)?   3. Are you having a reaction (difficulty breathing--STAT)? NO  4. What is your medication issue? HH Nurse states that pt was prescribed medication by PCP.  HH Nurse also would like to report pt's PT (25.4) and INR (2.5).    STAT if HR is under 50 or over 120 (normal HR is 60-100 beats per minute)  What is your heart rate? 56  Do you have a log of your heart rate readings (document readings)?  79,03,83  Do you have any other symptoms? Tired  Nurse states that pt's HR has been low but she managed to get it back up to 56. HH Nurse would like a callback regarding this matter

## 2021-09-24 NOTE — Telephone Encounter (Signed)
Spoke w/ [facility] nurse Harriett Sine.  Dr. August Saucer PCP started pt on guanfacine 2 mg to try and treat her BP, but now her HR has dropped to the 40s. Pt is on metoprolol tartrate 100 mg BID. She would like to know if pt should adjust her metoprolol dosage and/or wear a heart monitor.  Advised her that I will make Dr. Mariah Milling aware of her concerns and call her back w/ his recommendation.

## 2021-09-24 NOTE — Telephone Encounter (Signed)
After speaking with Harriett Sine from [facility] and verifying INR @ 2.5, I called and spoke to patient's husband to discuss results/recommendations.  He verbalized understanding

## 2021-09-25 ENCOUNTER — Encounter: Payer: Self-pay | Admitting: Cardiovascular Disease

## 2021-09-25 NOTE — Telephone Encounter (Signed)
Error

## 2021-09-26 ENCOUNTER — Telehealth: Payer: Self-pay | Admitting: Cardiovascular Disease

## 2021-09-26 DIAGNOSIS — I484 Atypical atrial flutter: Secondary | ICD-10-CM

## 2021-09-26 MED ORDER — WARFARIN SODIUM 2 MG PO TABS: 2.0000 mg | ORAL_TABLET | Freq: Every day | ORAL | 1 refills | Status: DC

## 2021-09-26 NOTE — Telephone Encounter (Signed)
Prescription refill request received for warfarin Lov: 07/17/21 Brandi [name]) Next INR check: 10/01/21 Warfarin tablet strength: 2mg   Appropriate dose and refill sent to requested pharmacy.

## 2021-09-26 NOTE — Telephone Encounter (Signed)
Refill request

## 2021-09-26 NOTE — Telephone Encounter (Signed)
*  STAT* If patient is at the pharmacy, call can be transferred to refill team.   1. Which medications need to be refilled? (please list name of each medication and dose if known)  warfarin (COUMADIN) 2 MG tablet  2. Which pharmacy/location (including street and city if local pharmacy) is medication to be sent to? [facility] - [place], [place] - [address]  3. Do they need a 30 day or 90 day supply? 90

## 2021-09-29 NOTE — Telephone Encounter (Signed)
Brandi Iba, MD  Sent: Brandi [name] September 28, 2021  2:27 PM  To: Vida Rigger [facility] Triage          Message  Several recent hospitalizations for A-fib, flutter  Coreg held, started on metoprolol 100 twice daily as well as diltiazem 60 twice daily  For now would decrease the diltiazem down to 30 twice daily stay on metoprolol  Continue to monitor blood pressure and heart rate, appears there is clinic visit in several weeks time  Thx  Tg

## 2021-09-29 NOTE — Telephone Encounter (Signed)
Called and spoke with the patient regarding Dr. Windell Hummingbird orders to: - decrease diltiazem to 30 mg BID - continue metoprolol tartrate at 100 mg BID  The patient voiced understanding of these recommendations and was able to read back.  I advised I would call Harriett Sine with [facility] and advise her of the changes.   I called Harriett Sine w/ [facility] and advised her of the above. Per Harriett Sine "we have a problem." She had also reached out to Dr. August Saucer on Friday as she was concerned about the patient's low HR readings. She did receive orders from Dr. August Saucer to: - decrease metoprolol tartrate to 50 mg BID - continue diltiazem at 60 mg BID  I advised Harriett Sine that it is difficult to manage the patient's care when multiple providers are treating the same issue and we were not aware Dr. August Saucer had been contacted. Harriett Sine voiced understanding and advised "I could not let the patient's heart rate stay like that over the weekend." Harriett Sine also advised that the patient's understanding of her medications are limited and that her husband manages these.  Harriett Sine has spoken with the patient's husband over the weekend and noted improvement in HR's to 62-68 bpm/ BP- 118/70.  I advised Harriett Sine, I will call the patient/ her husband back and advise that they continue with Dr. Diamantina Providence orders as stated above and that I will notify Dr. Mariah Milling as well.   Harriett Sine will see the patient back on Wednesday.  I have called and spoken with the patient and her husband, Brandi [name], and they are aware no further changes will be made today- please disregard orders that I called with initially. They will continue with metoprolol tartrate 50 mg BID and diltiazem 60 mg BID. Brandi [name] voiced understanding and was agreeable.

## 2021-10-01 ENCOUNTER — Ambulatory Visit (INDEPENDENT_AMBULATORY_CARE_PROVIDER_SITE_OTHER): Payer: Medicare Other | Admitting: Internal Medicine

## 2021-10-01 DIAGNOSIS — Z7901 Long term (current) use of anticoagulants: Secondary | ICD-10-CM | POA: Diagnosis not present

## 2021-10-01 LAB — POCT INR: INR: 3.1 — AB (ref 2.0–3.0)

## 2021-10-08 ENCOUNTER — Ambulatory Visit (INDEPENDENT_AMBULATORY_CARE_PROVIDER_SITE_OTHER): Payer: Medicare Other | Admitting: Cardiology

## 2021-10-08 DIAGNOSIS — Z7901 Long term (current) use of anticoagulants: Secondary | ICD-10-CM

## 2021-10-08 LAB — POCT INR: INR: 3.1 — AB (ref 2.0–3.0)

## 2021-10-15 ENCOUNTER — Ambulatory Visit (INDEPENDENT_AMBULATORY_CARE_PROVIDER_SITE_OTHER): Payer: Medicare Other | Admitting: Internal Medicine

## 2021-10-15 DIAGNOSIS — Z7901 Long term (current) use of anticoagulants: Secondary | ICD-10-CM | POA: Diagnosis not present

## 2021-10-15 LAB — POCT INR: INR: 3.8 — AB (ref 2.0–3.0)

## 2021-10-18 NOTE — Progress Notes (Unsigned)
Cardiology Office Note  Date:  10/18/2021   ID:  Brandi [name], DOB [birth year], MRN [id number]  PCP:  Rogers Blocker, MD   No chief complaint on file.   HPI:  Brandi [name] is a 81 -year-old woman with past medical history of Varicosities,  S/p Laser ablation of right greater saphenous vein  Hypertension Hyperlipidemia F/u of her  nearsyncope, abnormal EKG, HTN, hyperlipidemia  LOV with myself 3/23   Seen by Ignacia Bayley in 2020  In follow-up today reports having significant Right knee pain, Had cortisone shot, hoping to avoid surgery  Discussed her medications with her Crestor 1/2 pill 2x a week  BP at home:  125 to 130/75 at baseline  Blood pressure running higher recently, Feels that BP elevated in setting of prednisone/knee pain  Meds refilled by Dr. Marlou Sa Has been out of felodipine x 1 week  Reports that Dr. Marlou Sa did some blood work that showed some cardiac abnormality, she is unaware of the details Lab work has been requested  Denies significant shortness of breath on exertion, no chest pain concerning for angina  Carotid 6/22 reviewed Right Carotid: Velocities in the right ICA are consistent with a 1-39%  stenosis.  Extremely tortuous proximal right CCA.  Left Carotid: Velocities in the left ICA are consistent with a 1-39%  stenosis.   EKG personally reviewed by myself on todays visit NSr rate 67 no ST or T wave changes  PMH:   has a past medical history of Allergy, Hyperlipidemia, Hypertension, Neuromuscular disorder ([place]), and Venous insufficiency.  PSH:    Past Surgical History:  Procedure Laterality Date   ANKLE SURGERY Left    KNEE SURGERY Left    VARICOSE VEIN SURGERY      Current Outpatient Medications  Medication Sig Dispense Refill   acetaminophen (TYLENOL) 500 MG tablet Take 1,000 mg by mouth every 6 (six) hours as needed for mild pain.     azelastine (ASTELIN) 0.1 % nasal spray Place 2 sprays into both nostrils daily.     cetirizine (ZYRTEC)  10 MG tablet Take 10 mg by mouth daily as needed for allergies.     diltiazem (CARDIZEM) 60 MG tablet Take 1 tablet (60 mg total) by mouth 2 (two) times daily. 60 tablet 0   ferrous sulfate 325 (65 FE) MG tablet Take 325 mg by mouth daily with breakfast.     Lidocaine HCl (ASPERCREME LIDOCAINE) 4 % LIQD Apply 1 Application topically 2 (two) times daily as needed (knee pain).     metoprolol tartrate (LOPRESSOR) 50 MG tablet Take 1 tablet (50 mg) by mouth twice daily     Multiple Vitamins-Minerals (CENTRUM SILVER 50+WOMEN) TABS Take 1 tablet by mouth every evening.     OXYGEN Inhale 2 L/min into the lungs continuous.     pantoprazole (PROTONIX) 40 MG tablet Take 1 tablet (40 mg total) by mouth daily. (Patient taking differently: Take 40 mg by mouth daily before breakfast.) 30 tablet 0   Propylene Glycol (SYSTANE COMPLETE PF OP) Place 1 drop into both eyes 3 (three) times daily.     rosuvastatin (CRESTOR) 20 MG tablet Take 10 mg by mouth See admin instructions. Take 10 mg by mouth in the evening on Sun/Wed only (twice a week)     sertraline (ZOLOFT) 50 MG tablet Take 75 mg by mouth at bedtime.     warfarin (COUMADIN) 2 MG tablet Take 1 tablet (2 mg total) by mouth daily. 90 tablet 1   No  current facility-administered medications for this visit.    Allergies:   Amoxil [amoxicillin] and Celebrex [celecoxib]   Social History:  The patient  reports that she has never smoked. She has never used smokeless tobacco. She reports that she does not drink alcohol and does not use drugs.   Family History:   family history includes Heart attack in her father.   Review of Systems: Review of Systems  Constitutional: Negative.   Respiratory: Negative.    Cardiovascular: Negative.   Gastrointestinal: Negative.   Musculoskeletal: Negative.   Neurological: Negative.   Psychiatric/Behavioral: Negative.    All other systems reviewed and are negative.    PHYSICAL EXAM: VS:  There were no vitals taken for  this visit. , BMI There is no height or weight on file to calculate BMI. Constitutional:  oriented to person, place, and time. No distress.  HENT:  Head: Grossly normal Eyes:  no discharge. No scleral icterus.  Neck: No JVD, no carotid bruits  Cardiovascular: Regular rate and rhythm, no murmurs appreciated Pulmonary/Chest: Clear to auscultation bilaterally, no wheezes or rails Abdominal: Soft.  no distension.  no tenderness.  Musculoskeletal: Normal range of motion Neurological:  normal muscle tone. Coordination normal. No atrophy Skin: Skin warm and dry Psychiatric: normal affect, pleasant  Recent Labs: 07/12/2021: B Natriuretic Peptide 780.6 08/14/2021: ALT 17; TSH 1.427 08/16/2021: BUN 22; Creatinine, Ser 1.30; Hemoglobin 11.0; Magnesium 1.9; Platelets 256; Potassium 3.7; Sodium 139    Lipid Panel Lab Results  Component Value Date   CHOL 196 12/11/2011   HDL 56 12/11/2011   LDLCALC 18 12/11/2011   TRIG 88 12/11/2011      Wt Readings from Last 3 Encounters:  08/16/21 126 lb 8 oz (57.4 kg)  07/17/21 134 lb 8 oz (61 kg)  07/07/21 144 lb (65.3 kg)     ASSESSMENT AND PLAN:  Vasovagal episode No recent episodes  Chest pain with moderate risk for cardiac etiology -  Denies any shortness of breath or chest pain concerning for angina Lab work requested from Dr. August Saucer  Mixed hyperlipidemia Crestor 2x a week Statin intolerance   Total encounter time more than 30 minutes  Greater than 50% was spent in counseling and coordination of care with the patient   No orders of the defined types were placed in this encounter.    Signed, Dossie Arbour, M.D., Ph.D. 10/18/2021  [facility] [place], [place] [phone]

## 2021-10-20 ENCOUNTER — Ambulatory Visit: Payer: Medicare Other | Attending: Cardiovascular Disease | Admitting: Cardiovascular Disease

## 2021-10-20 ENCOUNTER — Encounter: Payer: Self-pay | Admitting: Cardiovascular Disease

## 2021-10-20 VITALS — BP 130/78 | HR 56 | Ht 59.0 in | Wt 134.5 lb

## 2021-10-20 DIAGNOSIS — I484 Atypical atrial flutter: Secondary | ICD-10-CM | POA: Diagnosis not present

## 2021-10-20 DIAGNOSIS — I739 Peripheral vascular disease, unspecified: Secondary | ICD-10-CM

## 2021-10-20 DIAGNOSIS — I6523 Occlusion and stenosis of bilateral carotid arteries: Secondary | ICD-10-CM

## 2021-10-20 DIAGNOSIS — R600 Localized edema: Secondary | ICD-10-CM | POA: Diagnosis not present

## 2021-10-20 DIAGNOSIS — E782 Mixed hyperlipidemia: Secondary | ICD-10-CM | POA: Diagnosis not present

## 2021-10-20 DIAGNOSIS — T466X5D Adverse effect of antihyperlipidemic and antiarteriosclerotic drugs, subsequent encounter: Secondary | ICD-10-CM

## 2021-10-20 DIAGNOSIS — M791 Myalgia, unspecified site: Secondary | ICD-10-CM

## 2021-10-20 NOTE — Patient Instructions (Addendum)
Medication Instructions:  No changes  If you need a refill on your cardiac medications before your next appointment, please call your pharmacy.   Lab work: No new labs needed  Testing/Procedures: No new testing needed  Follow-Up: At [facility], you and your health needs are our priority.  As part of our continuing mission to provide you with exceptional heart care, we have created designated Provider Care Teams.  These Care Teams include your primary Cardiologist (physician) and Advanced Practice Providers (APPs -  Physician Assistants and Nurse Practitioners) who all work together to provide you with the care you need, when you need it.  You will need a follow up appointment in 6 months, APP ok   Providers on your designated Care Team:   Murray Hodgkins, NP Christell Faith, PA-C Cadence Kathlen Mody, [place]  COVID-19 Vaccine Information can be found at: [email or web address].uk For questions related to vaccine distribution or appointments, please email [email or web address] or call [phone].   MONITOR YOUR BLOOD PRESSURE AT HOME. BRING YOUR BLOOD PRESSURE MACHINE TO YOUR COUMADIN CHECK AND WILL CHECK WITH OUR MACHINE TO MAKE SURE ACCURATE

## 2021-11-05 ENCOUNTER — Ambulatory Visit: Payer: Medicare Other | Attending: Cardiovascular Disease

## 2021-11-05 DIAGNOSIS — I484 Atypical atrial flutter: Secondary | ICD-10-CM | POA: Diagnosis not present

## 2021-11-05 DIAGNOSIS — Z5181 Encounter for therapeutic drug level monitoring: Secondary | ICD-10-CM | POA: Diagnosis not present

## 2021-11-05 LAB — POCT INR: INR: 4.7 — AB (ref 2.0–3.0)

## 2021-11-05 NOTE — Patient Instructions (Signed)
HOLD TODAY and THURSDAY then decrease to 1 tablet Daily, except 0.5 tablet on Monday and Friday. Recheck INR in 3 weeks with results called to [facility] office.  [phone]

## 2021-11-26 ENCOUNTER — Ambulatory Visit: Payer: Medicare Other | Attending: Cardiovascular Disease

## 2021-11-26 DIAGNOSIS — Z5181 Encounter for therapeutic drug level monitoring: Secondary | ICD-10-CM

## 2021-11-26 DIAGNOSIS — I484 Atypical atrial flutter: Secondary | ICD-10-CM

## 2021-11-26 LAB — POCT INR: INR: 2.1 (ref 2.0–3.0)

## 2021-11-26 NOTE — Patient Instructions (Signed)
Continue 1 tablet Daily, except 0.5 tablet on Monday and Friday. Recheck INR in 5 weeks with results called to [facility] office.  [phone]

## 2021-12-16 ENCOUNTER — Encounter: Payer: Self-pay | Admitting: Cardiovascular Disease

## 2021-12-31 ENCOUNTER — Ambulatory Visit: Payer: Medicare Other | Attending: Cardiovascular Disease

## 2021-12-31 DIAGNOSIS — Z7901 Long term (current) use of anticoagulants: Secondary | ICD-10-CM

## 2021-12-31 DIAGNOSIS — I484 Atypical atrial flutter: Secondary | ICD-10-CM | POA: Diagnosis not present

## 2021-12-31 DIAGNOSIS — Z5181 Encounter for therapeutic drug level monitoring: Secondary | ICD-10-CM

## 2021-12-31 LAB — POCT INR: INR: 3 (ref 2.0–3.0)

## 2021-12-31 NOTE — Patient Instructions (Signed)
Continue 1 tablet Daily, except 0.5 tablet on Monday and Friday. Recheck INR in 6 weeks.  [phone]  

## 2022-02-11 ENCOUNTER — Ambulatory Visit: Payer: Medicare Other | Attending: Cardiovascular Disease

## 2022-02-11 DIAGNOSIS — Z7901 Long term (current) use of anticoagulants: Secondary | ICD-10-CM

## 2022-02-11 DIAGNOSIS — Z5181 Encounter for therapeutic drug level monitoring: Secondary | ICD-10-CM

## 2022-02-11 DIAGNOSIS — I484 Atypical atrial flutter: Secondary | ICD-10-CM

## 2022-02-11 LAB — POCT INR: INR: 3.2 — AB (ref 2.0–3.0)

## 2022-02-11 NOTE — Patient Instructions (Signed)
Continue 1 tablet Daily, except 0.5 tablet on Monday and Friday. Recheck INR in 6 weeks.  [phone]

## 2022-03-11 ENCOUNTER — Other Ambulatory Visit: Payer: Self-pay | Admitting: *Deleted

## 2022-03-11 DIAGNOSIS — I484 Atypical atrial flutter: Secondary | ICD-10-CM

## 2022-03-11 MED ORDER — WARFARIN SODIUM 2 MG PO TABS: ORAL_TABLET | ORAL | 1 refills | Status: DC

## 2022-03-11 NOTE — Telephone Encounter (Signed)
Warfarin 67m refill Aflutter Last INR 02/11/22 Last OV 10/20/21

## 2022-03-12 ENCOUNTER — Encounter: Payer: Self-pay | Admitting: Pharmacist

## 2022-03-23 ENCOUNTER — Telehealth: Payer: Self-pay | Admitting: *Deleted

## 2022-03-23 NOTE — Telephone Encounter (Signed)
Called pt since received a Warfarin '2mg'$  refill from [facility] and we just sent a 90 day supply [facility] her [facility]. She states that her husband who is on a tractor at this time handles this and that she received her meds from the Mail Order service. She states to hold on sending and speak to him tomorrow about this. Advised will call back regarding this in the morning and she stated she would let him know.

## 2022-03-24 NOTE — Telephone Encounter (Signed)
Called pt this morning per her request on yesterday regarding where her warfarin refills should go since recently sent to [facility] & now the local [facility] is requesting a refill. There was no answer therefore left a message for her or husband to call back regarding this.

## 2022-03-25 ENCOUNTER — Ambulatory Visit: Payer: Medicare Other | Attending: Cardiovascular Disease

## 2022-03-25 DIAGNOSIS — I484 Atypical atrial flutter: Secondary | ICD-10-CM | POA: Diagnosis not present

## 2022-03-25 DIAGNOSIS — Z7901 Long term (current) use of anticoagulants: Secondary | ICD-10-CM

## 2022-03-25 LAB — POCT INR: INR: 3.3 — AB (ref 2.0–3.0)

## 2022-03-25 NOTE — Telephone Encounter (Signed)
Spoke to [employer], pt's husband who was with pt today at [place] and manages pt's Warfarin.  He states he does have Jantoven '2mg'$  tablets from [facility] for his wife.  He does not need refill sent to [place] locally.  Prefers to just continue getting medication through mail order!  Will deny Warfarin refill to local [facility].

## 2022-03-25 NOTE — Telephone Encounter (Signed)
Called [facility] to advise that the pt does not want the refill sent to them and that she prefers the [facility]. Associate states she will remove the refill request.

## 2022-03-25 NOTE — Patient Instructions (Signed)
Description   Skip today's dosage of Warfarin, then start taking 1 tablet daily, except 0.5 tablet on Mondays, Wednesdays and Fridays. Recheck INR in 4 weeks.  [phone]

## 2022-04-12 NOTE — Progress Notes (Unsigned)
Cardiology Office Note  Date:  04/13/2022   ID:  Brandi [name], DOB August 17, 1940, MRN [id number]  PCP:  Rogers Blocker, MD   Chief Complaint  Patient presents with   6 month follow up    "Doing well." Medications reviewed by the patient verbally.     HPI:  [name] is a 82 -year-old woman with past medical history of Varicosities,  S/p Laser ablation of right greater saphenous vein  Hypertension Hyperlipidemia 07/10/2021,  atrial flutter with RVR while with UTI F/u of her  nearsyncope,hyperlipidemia  LOV with myself October 2023  Sedentary at baseline, no exercise Chronic Fatigue Chronic left leg swelling, hx of fracture on left Denies significant shortness of breath on exertion  Husband reports that she has episodes, sometimes walking around in a fog Etiology unclear, seems to resolve without intervention  Low heart rate on today's visit, asymptomatic though endorses fatigue  Lab work reviewed CR 1.55 Reports that she does not drink much fluids  EKG personally reviewed by myself on todays visit Sinus bradycardia rate 52 bpm no significant ST-T wave changes  Other past medical history reviewed  hospital July 2023, atrial fibrillation, chronic kidney disease stage IIIb Presenting with altered mental status, UTI Noted to be in atrial fibrillation rate more than 110 in the hospital changed to warfarin for cost reasons Husband is on warfarin and wanted to switch  Carotid 6/22 reviewed Right Carotid: Velocities in the right ICA are consistent with a 1-39%  stenosis.  Extremely tortuous proximal right CCA.  Left Carotid: Velocities in the left ICA are consistent with a 1-39%  stenosis.    PMH:   has a past medical history of Allergy, Hyperlipidemia, Hypertension, Neuromuscular disorder ([place]), and Venous insufficiency.  PSH:    Past Surgical History:  Procedure Laterality Date   ANKLE SURGERY Left    KNEE SURGERY Left    VARICOSE VEIN SURGERY      Current  Outpatient Medications  Medication Sig Dispense Refill   acetaminophen (TYLENOL) 500 MG tablet Take 1,000 mg by mouth every 6 (six) hours as needed for mild pain.     azelastine (ASTELIN) 0.1 % nasal spray Place 2 sprays into both nostrils daily.     cetirizine (ZYRTEC) 10 MG tablet Take 10 mg by mouth daily as needed for allergies.     diltiazem (CARDIZEM) 60 MG tablet Take 1 tablet (60 mg total) by mouth 2 (two) times daily. 60 tablet 0   ferrous sulfate 325 (65 FE) MG tablet Take 325 mg by mouth daily with breakfast.     guanFACINE (TENEX) 2 MG tablet SMARTSIG:1 Tablet(s) By Mouth Every Evening     Lidocaine HCl (ASPERCREME LIDOCAINE) 4 % LIQD Apply 1 Application topically 2 (two) times daily as needed (knee pain).     metoprolol tartrate (LOPRESSOR) 50 MG tablet Take 1 tablet (50 mg) by mouth twice daily     Multiple Vitamins-Minerals (CENTRUM SILVER 50+WOMEN) TABS Take 1 tablet by mouth every evening.     pantoprazole (PROTONIX) 40 MG tablet Take 1 tablet (40 mg total) by mouth daily. 30 tablet 0   Propylene Glycol (SYSTANE COMPLETE PF OP) Place 1 drop into both eyes 3 (three) times daily.     rosuvastatin (CRESTOR) 20 MG tablet Take 10 mg by mouth See admin instructions. Take 10 mg by mouth in the evening on Sun/Wed only (twice a week)     sertraline (ZOLOFT) 50 MG tablet Take 75 mg by mouth at bedtime.  warfarin (COUMADIN) 2 MG tablet Take 1/2 tablet (1mg ) to 1 tablet (2mg ) by mouth daily or as directed by Anticoagulation Clinic. 90 tablet 1   OXYGEN Inhale 2 L/min into the lungs continuous. (Patient not taking: Reported on 04/13/2022)     No current facility-administered medications for this visit.    Allergies:   Amoxil [amoxicillin] and Celebrex [celecoxib]   Social History:  The patient  reports that she has never smoked. She has never used smokeless tobacco. She reports that she does not drink alcohol and does not use drugs.   Family History:   family history includes Heart  attack in her father.   Review of Systems: Review of Systems  Constitutional: Negative.   Respiratory: Negative.    Cardiovascular: Negative.   Gastrointestinal: Negative.   Musculoskeletal: Negative.   Neurological: Negative.   Psychiatric/Behavioral: Negative.    All other systems reviewed and are negative.   PHYSICAL EXAM: VS:  BP (!) 144/70 (BP Location: Left Arm, Patient Position: Sitting, Cuff Size: Normal)   Pulse (!) 52   Ht 5' (1.524 m)   Wt 136 lb (61.7 kg)   SpO2 93%   BMI 26.56 kg/m  , BMI Body mass index is 26.56 kg/m. Constitutional:  oriented to person, place, and time. No distress.  HENT:  Head: Grossly normal Eyes:  no discharge. No scleral icterus.  Neck: No JVD, no carotid bruits  Cardiovascular: Regular rate and rhythm, no murmurs appreciated Pulmonary/Chest: Clear to auscultation bilaterally, no wheezes or rails Abdominal: Soft.  no distension.  no tenderness.  Musculoskeletal: Normal range of motion Neurological:  normal muscle tone. Coordination normal. No atrophy Skin: Skin warm and dry Psychiatric: normal affect, pleasant  Recent Labs: 07/12/2021: B Natriuretic Peptide 780.6 08/14/2021: ALT 17; TSH 1.427 08/16/2021: BUN 22; Creatinine, Ser 1.30; Hemoglobin 11.0; Magnesium 1.9; Platelets 256; Potassium 3.7; Sodium 139    Lipid Panel Lab Results  Component Value Date   CHOL 196 12/11/2011   HDL 56 12/11/2011   LDLCALC 18 12/11/2011   TRIG 88 12/11/2011    Wt Readings from Last 3 Encounters:  04/13/22 136 lb (61.7 kg)  10/20/21 134 lb 8 oz (61 kg)  08/16/21 126 lb 8 oz (57.4 kg)     ASSESSMENT AND PLAN:  Paroxysmal atrial fibrillation No recent episodes of arrhythmia Prior episode in the setting of UTI, mental status changes .  Bradycardia will recommend she decrease metoprolol to tartrate down to 25 twice daily, continue diltiazem 60 twice daily  Vasovagal episode No recent episodes  Chest pain with moderate risk for cardiac  etiology -  No recent episodes of chest pain  Mixed hyperlipidemia Crestor 2x a week History of statin intolerance  HTN Blood pressure is well controlled on today's visit. No changes made to the medications.    Total encounter time more than 30 minutes  Greater than 50% was spent in counseling and coordination of care with the patient   No orders of the defined types were placed in this encounter.    Signed, Esmond Plants, M.D., Ph.D. 04/13/2022  [place], [place]

## 2022-04-13 ENCOUNTER — Encounter: Payer: Self-pay | Admitting: Cardiovascular Disease

## 2022-04-13 ENCOUNTER — Ambulatory Visit: Payer: Medicare Other | Attending: Cardiovascular Disease | Admitting: Cardiovascular Disease

## 2022-04-13 VITALS — BP 144/70 | HR 52 | Ht 60.0 in | Wt 136.0 lb

## 2022-04-13 DIAGNOSIS — M791 Myalgia, unspecified site: Secondary | ICD-10-CM

## 2022-04-13 DIAGNOSIS — I6523 Occlusion and stenosis of bilateral carotid arteries: Secondary | ICD-10-CM

## 2022-04-13 DIAGNOSIS — R600 Localized edema: Secondary | ICD-10-CM | POA: Diagnosis not present

## 2022-04-13 DIAGNOSIS — T466X5A Adverse effect of antihyperlipidemic and antiarteriosclerotic drugs, initial encounter: Secondary | ICD-10-CM

## 2022-04-13 DIAGNOSIS — E782 Mixed hyperlipidemia: Secondary | ICD-10-CM

## 2022-04-13 DIAGNOSIS — I484 Atypical atrial flutter: Secondary | ICD-10-CM

## 2022-04-13 DIAGNOSIS — T466X5D Adverse effect of antihyperlipidemic and antiarteriosclerotic drugs, subsequent encounter: Secondary | ICD-10-CM

## 2022-04-13 DIAGNOSIS — I739 Peripheral vascular disease, unspecified: Secondary | ICD-10-CM

## 2022-04-13 DIAGNOSIS — I480 Paroxysmal atrial fibrillation: Secondary | ICD-10-CM

## 2022-04-13 MED ORDER — METOPROLOL TARTRATE 25 MG PO TABS: 25.0000 mg | ORAL_TABLET | Freq: Two times a day (BID) | ORAL | 3 refills | Status: DC

## 2022-04-13 NOTE — Patient Instructions (Signed)
Medication Instructions:  Please decrease the metoprolol down to 25 mg twice a day   If you need a refill on your cardiac medications before your next appointment, please call your pharmacy.   Lab work: No new labs needed  Testing/Procedures: No new testing needed  Follow-Up: At [facility], you and your health needs are our priority.  As part of our continuing mission to provide you with exceptional heart care, we have created designated Provider Care Teams.  These Care Teams include your primary Cardiologist (physician) and Advanced Practice Providers (APPs -  Physician Assistants and Nurse Practitioners) who all work together to provide you with the care you need, when you need it.  You will need a follow up appointment in 12 months  Providers on your designated Care Team:   Murray Hodgkins, NP Christell Faith, PA-C Cadence Kathlen Mody, [place]  COVID-19 Vaccine Information can be found at: ShippingScam.co.uk For questions related to vaccine distribution or appointments, please email [email or web address] or call [phone].

## 2022-04-16 NOTE — Telephone Encounter (Signed)
This encounter was created in error - please disregard.

## 2022-04-20 ENCOUNTER — Other Ambulatory Visit: Payer: Self-pay | Admitting: Pharmacist

## 2022-04-20 DIAGNOSIS — I484 Atypical atrial flutter: Secondary | ICD-10-CM

## 2022-04-20 MED ORDER — WARFARIN SODIUM 2 MG PO TABS: ORAL_TABLET | ORAL | 1 refills | Status: DC

## 2022-04-22 ENCOUNTER — Ambulatory Visit: Payer: Medicare Other | Attending: Cardiovascular Disease

## 2022-04-22 DIAGNOSIS — I484 Atypical atrial flutter: Secondary | ICD-10-CM | POA: Diagnosis not present

## 2022-04-22 DIAGNOSIS — Z7901 Long term (current) use of anticoagulants: Secondary | ICD-10-CM | POA: Diagnosis not present

## 2022-04-22 LAB — POCT INR: INR: 2.8 (ref 2.0–3.0)

## 2022-04-22 NOTE — Patient Instructions (Signed)
Description   Continue on same dosage of Warfarin 1 tablet daily, except 0.5 tablet on Mondays, Wednesdays and Fridays. Recheck INR in 4 weeks.  [phone]

## 2022-05-20 ENCOUNTER — Ambulatory Visit: Payer: Medicare Other | Attending: Cardiovascular Disease | Admitting: *Deleted

## 2022-05-20 DIAGNOSIS — Z5181 Encounter for therapeutic drug level monitoring: Secondary | ICD-10-CM

## 2022-05-20 DIAGNOSIS — I484 Atypical atrial flutter: Secondary | ICD-10-CM | POA: Diagnosis not present

## 2022-05-20 LAB — POCT INR: INR: 2.5 (ref 2.0–3.0)

## 2022-05-20 NOTE — Patient Instructions (Signed)
Continue on same dosage of Warfarin 1 tablet daily, except 0.5 tablet on Mondays, Wednesdays and Fridays. Recheck INR in 4 weeks.  [phone]

## 2022-06-17 ENCOUNTER — Ambulatory Visit: Payer: Medicare Other | Attending: Cardiovascular Disease

## 2022-06-17 DIAGNOSIS — Z7901 Long term (current) use of anticoagulants: Secondary | ICD-10-CM | POA: Diagnosis not present

## 2022-06-17 DIAGNOSIS — I484 Atypical atrial flutter: Secondary | ICD-10-CM

## 2022-06-17 LAB — POCT INR: INR: 2.7 (ref 2.0–3.0)

## 2022-06-17 NOTE — Patient Instructions (Signed)
Continue on same dosage of Warfarin 1 tablet daily, except 0.5 tablet on Mondays, Wednesdays and Fridays. Recheck INR in 4 weeks.  [phone] 

## 2022-06-27 IMAGING — DX DG CHEST 1V PORT
1 series · 1 of 1 positions shown · non-contrast
Comparison: Chest radiograph 07/10/2021

CLINICAL DATA: Shortness of breath.

EXAM:
PORTABLE CHEST 1 VIEW

[chest ap]
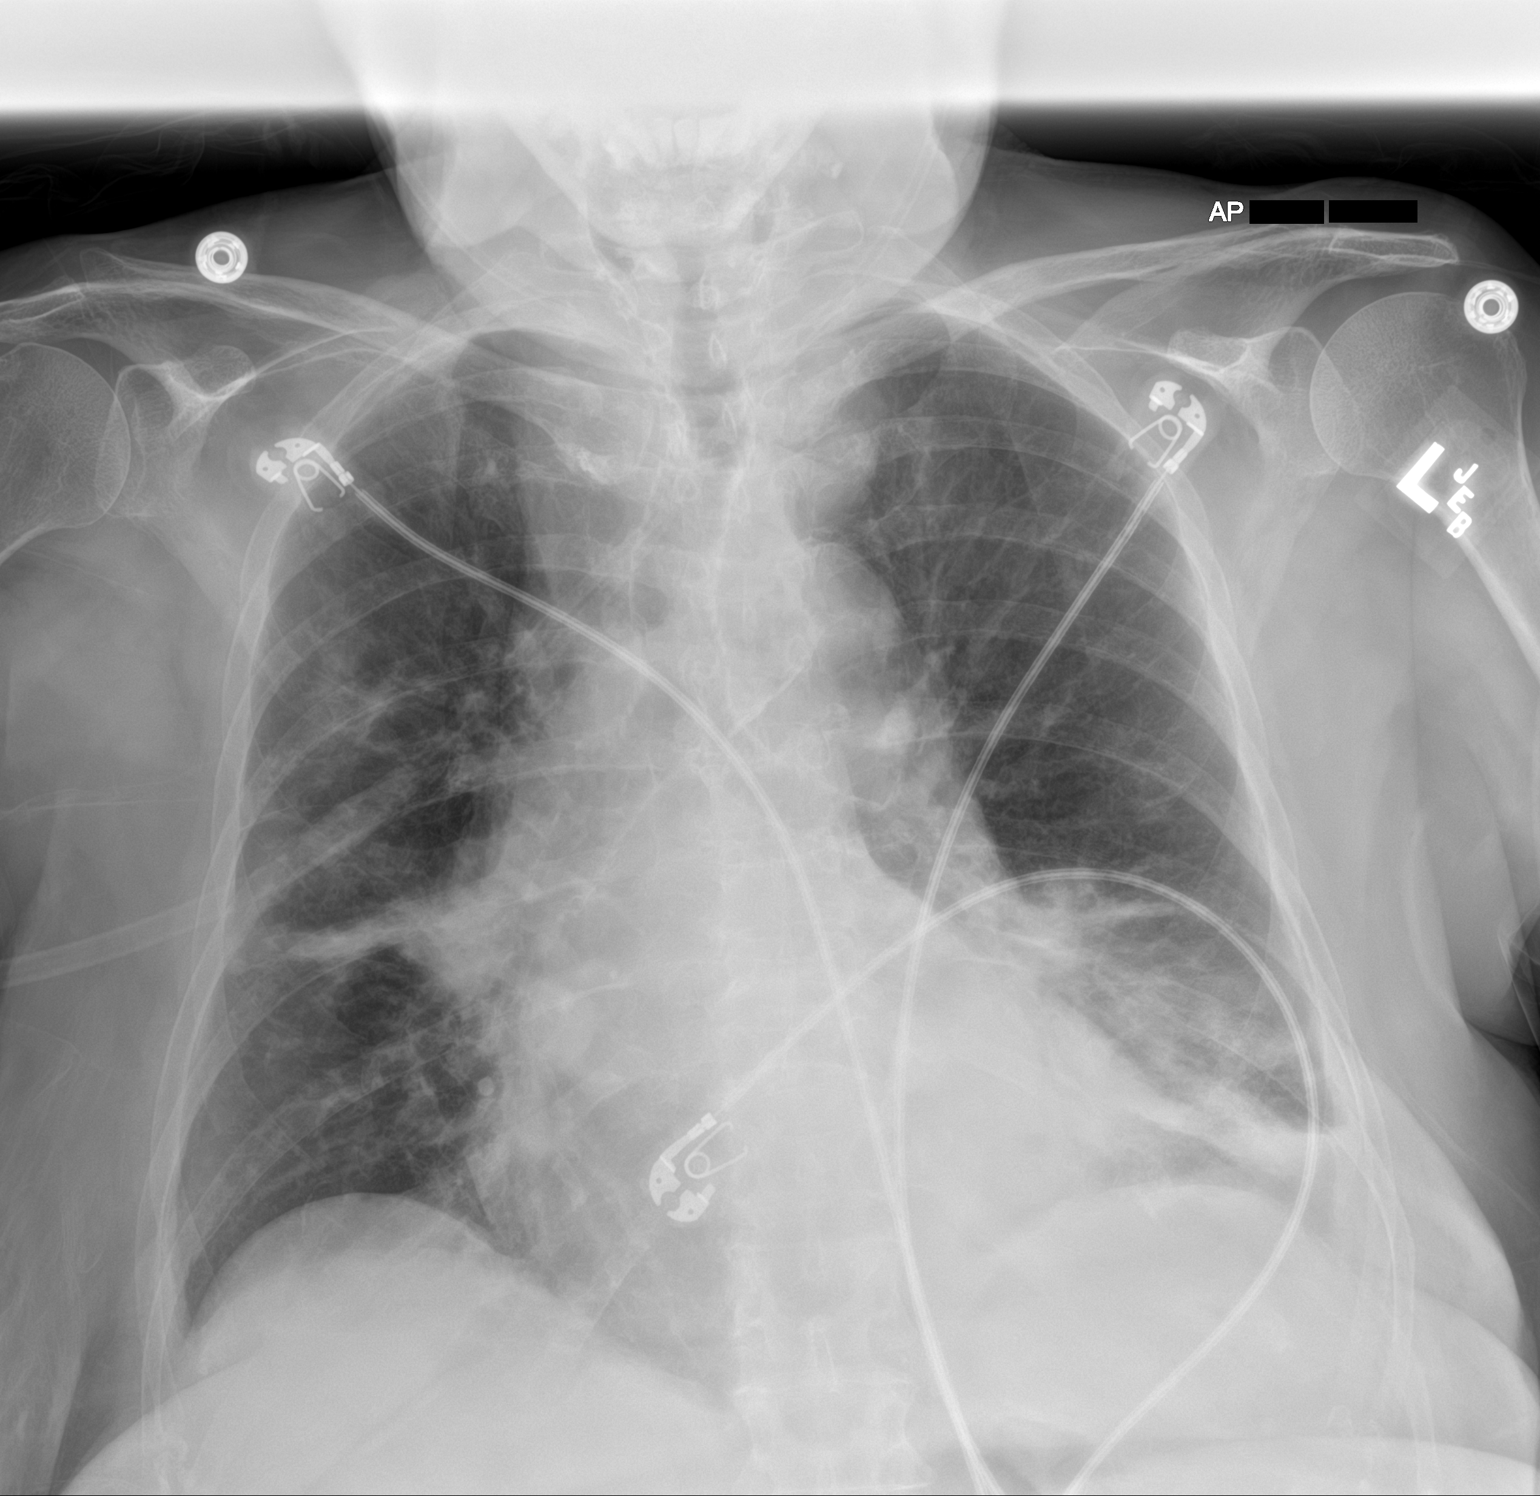

[1 of 1 positions shown; findings below may reference images not displayed]

FINDINGS: The cardiac silhouette remains enlarged. Patchy and streaky
opacities in the left lower lung and, to a lesser extent, right
perihilar region are unchanged. No sizable pleural effusion or
pneumothorax is identified.
IMPRESSION: Unchanged left greater than right lung consolidation and/or
atelectasis.

## 2022-07-15 ENCOUNTER — Ambulatory Visit: Payer: Medicare Other | Attending: Cardiovascular Disease

## 2022-07-15 DIAGNOSIS — Z7901 Long term (current) use of anticoagulants: Secondary | ICD-10-CM

## 2022-07-15 DIAGNOSIS — I484 Atypical atrial flutter: Secondary | ICD-10-CM

## 2022-07-15 LAB — POCT INR: INR: 2.5 (ref 2.0–3.0)

## 2022-07-15 NOTE — Patient Instructions (Signed)
Continue on same dosage of Warfarin 1 tablet daily, except 0.5 tablet on Mondays, Wednesdays and Fridays. Recheck INR in 3 weeks.  [phone]

## 2022-08-05 ENCOUNTER — Ambulatory Visit: Payer: Medicare Other | Attending: Cardiovascular Disease

## 2022-08-05 DIAGNOSIS — I484 Atypical atrial flutter: Secondary | ICD-10-CM | POA: Diagnosis not present

## 2022-08-05 DIAGNOSIS — Z7901 Long term (current) use of anticoagulants: Secondary | ICD-10-CM | POA: Diagnosis not present

## 2022-08-05 LAB — POCT INR: INR: 1.5 — AB (ref 2.0–3.0)

## 2022-08-05 NOTE — Patient Instructions (Addendum)
Take 1 tablet today only then Continue on same dosage of Warfarin 1 tablet daily, except 0.5 tablet on Mondays, Wednesdays and Fridays. Recheck INR in 4 weeks.  [phone]

## 2022-08-25 ENCOUNTER — Emergency Department (HOSPITAL_COMMUNITY): Payer: Medicare Other

## 2022-08-25 ENCOUNTER — Other Ambulatory Visit: Payer: Self-pay

## 2022-08-25 ENCOUNTER — Observation Stay (HOSPITAL_COMMUNITY)
Admission: EM | Admit: 2022-08-25 | Discharge: 2022-08-30 | Disposition: A | Discharge status: Skilled Nursing Facility | Payer: Medicare Other | Attending: Internal Medicine | Admitting: Internal Medicine

## 2022-08-25 ENCOUNTER — Encounter (HOSPITAL_COMMUNITY): Payer: Self-pay | Admitting: Internal Medicine

## 2022-08-25 DIAGNOSIS — Z7901 Long term (current) use of anticoagulants: Secondary | ICD-10-CM | POA: Insufficient documentation

## 2022-08-25 DIAGNOSIS — S72001A Fracture of unspecified part of neck of right femur, initial encounter for closed fracture: Secondary | ICD-10-CM | POA: Diagnosis not present

## 2022-08-25 DIAGNOSIS — W19XXXA Unspecified fall, initial encounter: Principal | ICD-10-CM

## 2022-08-25 DIAGNOSIS — S3282XA Multiple fractures of pelvis without disruption of pelvic ring, initial encounter for closed fracture: Secondary | ICD-10-CM | POA: Diagnosis present

## 2022-08-25 DIAGNOSIS — M25551 Pain in right hip: Secondary | ICD-10-CM | POA: Diagnosis present

## 2022-08-25 DIAGNOSIS — S32591A Other specified fracture of right pubis, initial encounter for closed fracture: Secondary | ICD-10-CM

## 2022-08-25 DIAGNOSIS — S32501A Unspecified fracture of right pubis, initial encounter for closed fracture: Secondary | ICD-10-CM | POA: Diagnosis not present

## 2022-08-25 DIAGNOSIS — S3210XA Unspecified fracture of sacrum, initial encounter for closed fracture: Secondary | ICD-10-CM | POA: Insufficient documentation

## 2022-08-25 DIAGNOSIS — I10 Essential (primary) hypertension: Secondary | ICD-10-CM | POA: Diagnosis present

## 2022-08-25 DIAGNOSIS — E782 Mixed hyperlipidemia: Secondary | ICD-10-CM | POA: Diagnosis present

## 2022-08-25 DIAGNOSIS — I129 Hypertensive chronic kidney disease with stage 1 through stage 4 chronic kidney disease, or unspecified chronic kidney disease: Secondary | ICD-10-CM | POA: Insufficient documentation

## 2022-08-25 DIAGNOSIS — Z79899 Other long term (current) drug therapy: Secondary | ICD-10-CM | POA: Insufficient documentation

## 2022-08-25 DIAGNOSIS — S22070A Wedge compression fracture of T9-T10 vertebra, initial encounter for closed fracture: Secondary | ICD-10-CM | POA: Diagnosis present

## 2022-08-25 DIAGNOSIS — S22079A Unspecified fracture of T9-T10 vertebra, initial encounter for closed fracture: Secondary | ICD-10-CM | POA: Diagnosis not present

## 2022-08-25 DIAGNOSIS — I480 Paroxysmal atrial fibrillation: Secondary | ICD-10-CM | POA: Diagnosis present

## 2022-08-25 DIAGNOSIS — W010XXA Fall on same level from slipping, tripping and stumbling without subsequent striking against object, initial encounter: Secondary | ICD-10-CM | POA: Diagnosis not present

## 2022-08-25 DIAGNOSIS — S0990XA Unspecified injury of head, initial encounter: Secondary | ICD-10-CM | POA: Insufficient documentation

## 2022-08-25 DIAGNOSIS — N1832 Chronic kidney disease, stage 3b: Secondary | ICD-10-CM | POA: Diagnosis not present

## 2022-08-25 LAB — CBC WITH DIFFERENTIAL/PLATELET
Abs Immature Granulocytes: 0.05 10*3/uL (ref 0.00–0.07)
Basophils Absolute: 0 10*3/uL (ref 0.0–0.1)
Basophils Relative: 0 %
Eosinophils Absolute: 0.2 10*3/uL (ref 0.0–0.5)
Eosinophils Relative: 2 %
HCT: 34.1 % — ABNORMAL LOW (ref 36.0–46.0)
Hemoglobin: 10.6 g/dL — ABNORMAL LOW (ref 12.0–15.0)
Immature Granulocytes: 1 %
Lymphocytes Relative: 10 %
Lymphs Abs: 1 10*3/uL (ref 0.7–4.0)
MCH: 28.9 pg (ref 26.0–34.0)
MCHC: 31.1 g/dL (ref 30.0–36.0)
MCV: 92.9 fL (ref 80.0–100.0)
Monocytes Absolute: 0.5 10*3/uL (ref 0.1–1.0)
Monocytes Relative: 5 %
Neutro Abs: 9.1 10*3/uL — ABNORMAL HIGH (ref 1.7–7.7)
Neutrophils Relative %: 82 %
Platelets: 172 10*3/uL (ref 150–400)
RBC: 3.67 MIL/uL — ABNORMAL LOW (ref 3.87–5.11)
RDW: 12.7 % (ref 11.5–15.5)
WBC: 10.9 10*3/uL — ABNORMAL HIGH (ref 4.0–10.5)
nRBC: 0 % (ref 0.0–0.2)

## 2022-08-25 LAB — COMPREHENSIVE METABOLIC PANEL
ALT: 20 U/L (ref 0–44)
AST: 28 U/L (ref 15–41)
Albumin: 3.4 g/dL — ABNORMAL LOW (ref 3.5–5.0)
Alkaline Phosphatase: 98 U/L (ref 38–126)
Anion gap: 15 (ref 5–15)
BUN: 22 mg/dL (ref 8–23)
CO2: 28 mmol/L (ref 22–32)
Calcium: 9.4 mg/dL (ref 8.9–10.3)
Chloride: 94 mmol/L — ABNORMAL LOW (ref 98–111)
Creatinine, Ser: 1.56 mg/dL — ABNORMAL HIGH (ref 0.44–1.00)
GFR, Estimated: 33 mL/min — ABNORMAL LOW (ref >60)
Glucose, Bld: 119 mg/dL — ABNORMAL HIGH (ref 70–99)
Potassium: 3.8 mmol/L (ref 3.5–5.1)
Sodium: 137 mmol/L (ref 135–145)
Total Bilirubin: 0.8 mg/dL (ref 0.3–1.2)
Total Protein: 6.9 g/dL (ref 6.5–8.1)

## 2022-08-25 LAB — PROTIME-INR
INR: 2 — ABNORMAL HIGH (ref 0.8–1.2)
Prothrombin Time: 23 seconds — ABNORMAL HIGH (ref 11.4–15.2)

## 2022-08-25 MED ORDER — MORPHINE SULFATE (PF) 4 MG/ML IV SOLN
4.0000 mg | Freq: Once | INTRAVENOUS | Status: AC
  Administered 2022-08-25: 4 mg via INTRAVENOUS
  Filled 2022-08-25: qty 1

## 2022-08-25 MED ORDER — HYDROMORPHONE HCL 1 MG/ML IJ SOLN
0.5000 mg | Freq: Once | INTRAMUSCULAR | Status: AC
  Administered 2022-08-25: 0.5 mg via INTRAVENOUS
  Filled 2022-08-25: qty 1

## 2022-08-25 MED ORDER — HYDROCODONE-ACETAMINOPHEN 5-325 MG PO TABS
1.0000 | ORAL_TABLET | ORAL | Status: DC | PRN
  Administered 2022-08-26: 1 via ORAL
  Administered 2022-08-27 – 2022-08-28 (×3): 2 via ORAL
  Administered 2022-08-28 – 2022-08-29 (×2): 1 via ORAL
  Filled 2022-08-25: qty 2
  Filled 2022-08-25 (×2): qty 1
  Filled 2022-08-25 (×2): qty 2
  Filled 2022-08-25 (×2): qty 1

## 2022-08-25 MED ORDER — SODIUM CHLORIDE 0.9% FLUSH
3.0000 mL | Freq: Two times a day (BID) | INTRAVENOUS | Status: DC
  Administered 2022-08-26 – 2022-08-30 (×9): 3 mL via INTRAVENOUS

## 2022-08-25 MED ORDER — IOHEXOL 350 MG/ML SOLN
65.0000 mL | Freq: Once | INTRAVENOUS | Status: AC | PRN
  Administered 2022-08-25: 65 mL via INTRAVENOUS

## 2022-08-25 MED ORDER — ONDANSETRON HCL 4 MG/2ML IJ SOLN
4.0000 mg | Freq: Once | INTRAMUSCULAR | Status: AC
  Administered 2022-08-25: 4 mg via INTRAVENOUS
  Filled 2022-08-25: qty 2

## 2022-08-25 MED ORDER — ONDANSETRON HCL 4 MG PO TABS: 4.0000 mg | ORAL_TABLET | Freq: Four times a day (QID) | ORAL | Status: DC | PRN

## 2022-08-25 MED ORDER — ONDANSETRON HCL 4 MG/2ML IJ SOLN: 4.0000 mg | Freq: Four times a day (QID) | INTRAMUSCULAR | Status: DC | PRN

## 2022-08-25 MED ORDER — SENNOSIDES-DOCUSATE SODIUM 8.6-50 MG PO TABS: 1.0000 | ORAL_TABLET | Freq: Every evening | ORAL | Status: DC | PRN

## 2022-08-25 MED ORDER — SERTRALINE HCL 50 MG PO TABS
75.0000 mg | ORAL_TABLET | Freq: Every day | ORAL | Status: DC
  Administered 2022-08-26 – 2022-08-29 (×5): 75 mg via ORAL
  Filled 2022-08-25 (×5): qty 1

## 2022-08-25 MED ORDER — ACETAMINOPHEN 650 MG RE SUPP: 650.0000 mg | Freq: Four times a day (QID) | RECTAL | Status: DC | PRN

## 2022-08-25 MED ORDER — PANTOPRAZOLE SODIUM 40 MG PO TBEC
40.0000 mg | DELAYED_RELEASE_TABLET | Freq: Every day | ORAL | Status: DC
  Administered 2022-08-26 – 2022-08-30 (×5): 40 mg via ORAL
  Filled 2022-08-25 (×5): qty 1

## 2022-08-25 MED ORDER — METOPROLOL TARTRATE 25 MG PO TABS
25.0000 mg | ORAL_TABLET | Freq: Two times a day (BID) | ORAL | Status: DC
  Administered 2022-08-25 – 2022-08-30 (×10): 25 mg via ORAL
  Filled 2022-08-25 (×10): qty 1

## 2022-08-25 MED ORDER — ROSUVASTATIN CALCIUM 5 MG PO TABS
10.0000 mg | ORAL_TABLET | ORAL | Status: DC
  Administered 2022-08-26: 10 mg via ORAL
  Filled 2022-08-25: qty 2

## 2022-08-25 MED ORDER — DILTIAZEM HCL 60 MG PO TABS
60.0000 mg | ORAL_TABLET | Freq: Two times a day (BID) | ORAL | Status: DC
  Administered 2022-08-26: 60 mg via ORAL
  Filled 2022-08-25 (×2): qty 1

## 2022-08-25 MED ORDER — ACETAMINOPHEN 325 MG PO TABS: 650.0000 mg | ORAL_TABLET | Freq: Four times a day (QID) | ORAL | Status: DC | PRN

## 2022-08-25 MED ORDER — BISACODYL 5 MG PO TBEC: 5.0000 mg | DELAYED_RELEASE_TABLET | Freq: Every day | ORAL | Status: DC | PRN

## 2022-08-25 MED ORDER — HYDROMORPHONE HCL 1 MG/ML IJ SOLN
0.5000 mg | INTRAMUSCULAR | Status: DC | PRN
  Administered 2022-08-26 – 2022-08-28 (×2): 0.5 mg via INTRAVENOUS
  Filled 2022-08-25 (×2): qty 0.5

## 2022-08-25 NOTE — ED Notes (Addendum)
ED TO INPATIENT HANDOFF REPORT  ED Nurse Name and Phone #: / [phone]  S Name/Age/Gender Brandi [name] 82 y.o. female Room/Bed: 021C/021C  Code Status   Code Status: Prior  Home/SNF/Other Home Patient oriented to: self, place, time, and situation Is this baseline? Yes   Triage Complete: Triage complete  Chief Complaint Closed traumatic fractures of multiple bones of right hip and pelvis, initial encounter (HCC) [S72.001A, S32.82XA]  Triage Note No notes on file   Allergies Allergies  Allergen Reactions   Amoxil [Amoxicillin] Diarrhea   Celebrex [Celecoxib] Other (See Comments)    Bruising and stomach pain    Level of Care/Admitting Diagnosis ED Disposition     ED Disposition  Admit   Condition  --   Comment  Hospital Area: [facility] [[id number]]  Level of Care: Med-Surg [16]  May place patient in observation at [facility] or [facility] if equivalent level of care is available:: No  Covid Evaluation: Asymptomatic - no recent exposure (last 10 days) testing not required  Diagnosis: Closed traumatic fractures of multiple bones of right hip and pelvis, initial encounter [facility]) [[id number]]  Admitting Physician: Charlsie Quest [[id number]]  Attending Physician: Charlsie Quest [[id number]]          B Medical/Surgery History Past Medical History:  Diagnosis Date   Allergy    Hyperlipidemia    Hypertension    Neuromuscular disorder (HCC)    Venous insufficiency    Past Surgical History:  Procedure Laterality Date   ANKLE SURGERY Left    KNEE SURGERY Left    VARICOSE VEIN SURGERY       A IV Location/Drains/Wounds Patient Lines/Drains/Airways Status     Active Line/Drains/Airways     Name Placement date Placement time Site Days   Peripheral IV 08/25/22 20 G Right Antecubital 08/25/22  1707  Antecubital  less than 1            Intake/Output Last 24 hours No intake or output data in the 24 hours ending 08/25/22  2206  Labs/Imaging Results for orders placed or performed during the hospital encounter of 08/25/22 (from the past 48 hour(s))  CBC with Differential     Status: Abnormal   Collection Time: 08/25/22  5:08 PM  Result Value Ref Range   WBC 10.9 (H) 4.0 - 10.5 K/uL   RBC 3.67 (L) 3.87 - 5.11 MIL/uL   Hemoglobin 10.6 (L) 12.0 - 15.0 g/dL   HCT 40.3 (L) 47.4 - 25.9 %   MCV 92.9 80.0 - 100.0 fL   MCH 28.9 26.0 - 34.0 pg   MCHC 31.1 30.0 - 36.0 g/dL   RDW 56.3 87.5 - 64.3 %   Platelets 172 150 - 400 K/uL   nRBC 0.0 0.0 - 0.2 %   Neutrophils Relative % 82 %   Neutro Abs 9.1 (H) 1.7 - 7.7 K/uL   Lymphocytes Relative 10 %   Lymphs Abs 1.0 0.7 - 4.0 K/uL   Monocytes Relative 5 %   Monocytes Absolute 0.5 0.1 - 1.0 K/uL   Eosinophils Relative 2 %   Eosinophils Absolute 0.2 0.0 - 0.5 K/uL   Basophils Relative 0 %   Basophils Absolute 0.0 0.0 - 0.1 K/uL   Immature Granulocytes 1 %   Abs Immature Granulocytes 0.05 0.00 - 0.07 K/uL    Comment: Performed at [facility], [address], [place], [place] [address]  Comprehensive metabolic panel     Status: Abnormal   Collection  Time: 08/25/22  5:08 PM  Result Value Ref Range   Sodium 137 135 - 145 mmol/L   Potassium 3.8 3.5 - 5.1 mmol/L   Chloride 94 (L) 98 - 111 mmol/L   CO2 28 22 - 32 mmol/L   Glucose, Bld 119 (H) 70 - 99 mg/dL    Comment: Glucose reference range applies only to samples taken after fasting for at least 8 hours.   BUN 22 8 - 23 mg/dL   Creatinine, Ser 4.09 (H) 0.44 - 1.00 mg/dL   Calcium 9.4 8.9 - 81.1 mg/dL   Total Protein 6.9 6.5 - 8.1 g/dL   Albumin 3.4 (L) 3.5 - 5.0 g/dL   AST 28 15 - 41 U/L   ALT 20 0 - 44 U/L   Alkaline Phosphatase 98 38 - 126 U/L   Total Bilirubin 0.8 0.3 - 1.2 mg/dL   GFR, Estimated 33 (L) >60 mL/min    Comment: (NOTE) Calculated using the CKD-EPI Creatinine Equation (2021)    Anion gap 15 5 - 15    Comment: Performed at [facility], [address], [place], [place] [address]   Protime-INR     Status: Abnormal   Collection Time: 08/25/22  5:08 PM  Result Value Ref Range   Prothrombin Time 23.0 (H) 11.4 - 15.2 seconds   INR 2.0 (H) 0.8 - 1.2    Comment: (NOTE) INR goal varies based on device and disease states. Performed at [facility], [address], [place], [place] [address]    CT L-SPINE NO CHARGE  Result Date: 08/25/2022 CLINICAL DATA:  Pain, fall EXAM: CT LUMBAR SPINE WITHOUT CONTRAST TECHNIQUE: Multidetector CT imaging of the lumbar spine was performed without intravenous contrast administration. Multiplanar CT image reconstructions were also generated. RADIATION DOSE REDUCTION: This exam was performed according to the departmental dose-optimization program which includes automated exposure control, adjustment of the mA and/or kV according to patient size and/or use of iterative reconstruction technique. COMPARISON:  Chest x-ray 08/14/2021. CT chest, abdomen and pelvis performed today. FINDINGS: Segmentation: 5 lumbar type vertebrae. Alignment: 5 mm of anterolisthesis of L5 on S1 related to facet disease. Vertebrae: Compression fracture at T11 and partially visualized at T10, stable since prior chest x-ray. No acute fracture. Paraspinal and other soft tissues: Negative. Disc levels: Degenerative disc disease at L4-5 and L5-S1 with disc space narrowing and vacuum disc. Diffuse degenerative facet disease. Other: Fractures through the right sacral ala and right superior pubic ramus. IMPRESSION: Chronic compression fractures at T10 and T11. No acute lumbar spine fracture. Degenerative disc and facet disease in the lower lumbar spine. Right sacral ala fracture and fracture through the superior pubic ramus on the right. See discussion on CT chest, abdomen and pelvis report today. Electronically Signed   By: Charlett Nose M.D.   On: 08/25/2022 20:53   CT CHEST ABDOMEN PELVIS W CONTRAST  Result Date: 08/25/2022 CLINICAL DATA:  Polytrauma, blunt fall, right flank pain.  Fall, right flank pain EXAM: CT CHEST, ABDOMEN, AND PELVIS WITH CONTRAST TECHNIQUE: Multidetector CT imaging of the chest, abdomen and pelvis was performed following the standard protocol during bolus administration of intravenous contrast. RADIATION DOSE REDUCTION: This exam was performed according to the departmental dose-optimization program which includes automated exposure control, adjustment of the mA and/or kV according to patient size and/or use of iterative reconstruction technique. CONTRAST:  65mL OMNIPAQUE IOHEXOL 350 MG/ML SOLN COMPARISON:  None Available. FINDINGS: CT CHEST FINDINGS Cardiovascular: Heart is normal size. Aorta  is normal caliber. Coronary artery and aortic atherosclerosis. Mediastinum/Nodes: No mediastinal, hilar, or axillary adenopathy. Trachea and esophagus are unremarkable. Lungs/Pleura: Linear scarring in the lungs bilaterally. Clustered tree-in-bud nodular densities in both lower lobes and right middle lobe compatible with small airways disease/alveolitis. No confluent airspace opacities, effusions or pneumothorax. Musculoskeletal: Chest wall soft tissues are unremarkable. Moderate compression fracture at T10 and mild compression fracture at T11. These are stable when compared to prior chest x-ray from 08/14/2021. CT ABDOMEN PELVIS FINDINGS Hepatobiliary: No hepatic injury or perihepatic hematoma. Gallbladder is unremarkable. Pancreas: No focal abnormality or ductal dilatation. Spleen: No splenic injury or perisplenic hematoma. Adrenals/Urinary Tract: No adrenal hemorrhage or renal injury identified. Bladder is distended. Mild fullness of the left renal collecting system and ureter. No obstructing stones. Stomach/Bowel: Left colonic diverticulosis. No active diverticulitis. Stomach and small bowel decompressed, unremarkable. Vascular/Lymphatic: Diffuse aortoiliac atherosclerosis. No evidence of aneurysm or adenopathy. Reproductive: Prior hysterectomy.  No adnexal masses. Other: No  free fluid or free air. Musculoskeletal: Fractures noted through the right superior and inferior pubic rami. Adjacent stranding compatible with small extraperitoneal hematoma. Fracture through the right side of the sacrum no proximal femoral abnormality. IMPRESSION: Fractures through the right superior and inferior pubic rami. Mild stranding adjacent to the superior pubic ramus and pubic symphysis compatible with small extraperitoneal hematoma. Right sacral ala fracture. No evidence of solid organ injury. No evidence of significant traumatic injury in the chest. Coronary artery disease, aortic atherosclerosis. Clustered tree-in-bud nodular densities in the lower lobes bilaterally as well as right middle lobe compatible with small airways disease/alveolitis. Areas of scarring throughout the lungs. Distention of the urinary bladder. Mild fullness of the left renal collecting system and ureter without obstructing stone. Left colonic diverticulosis. Electronically Signed   By: Charlett Nose M.D.   On: 08/25/2022 20:50   CT Cervical Spine Wo Contrast  Result Date: 08/25/2022 CLINICAL DATA:  Neck trauma (Age >= 65y).  Pain, fall EXAM: CT CERVICAL SPINE WITHOUT CONTRAST TECHNIQUE: Multidetector CT imaging of the cervical spine was performed without intravenous contrast. Multiplanar CT image reconstructions were also generated. RADIATION DOSE REDUCTION: This exam was performed according to the departmental dose-optimization program which includes automated exposure control, adjustment of the mA and/or kV according to patient size and/or use of iterative reconstruction technique. COMPARISON:  None Available. FINDINGS: Alignment: No subluxation. Skull base and vertebrae: No acute fracture. No primary bone lesion or focal pathologic process. Soft tissues and spinal canal: No prevertebral fluid or swelling. No visible canal hematoma. Disc levels: Diffuse degenerative disc disease and facet disease, moderate to advanced. Upper  chest: No acute findings Other: Bilateral nodules in the thyroid the largest 1 cm in the left thyroid lobe. Not clinically significant; no follow-up imaging recommended (ref: J Am Coll Radiol. 2015 Feb;12(2): 143-50). IMPRESSION: Diffuse degenerative disc and facet disease. No acute bony abnormality. Electronically Signed   By: Charlett Nose M.D.   On: 08/25/2022 20:41   CT HEAD WO CONTRAST ( )  Result Date: 08/25/2022 CLINICAL DATA:  Head trauma, moderate-severe.  Fall. EXAM: CT HEAD WITHOUT CONTRAST TECHNIQUE: Contiguous axial images were obtained from the base of the skull through the vertex without intravenous contrast. RADIATION DOSE REDUCTION: This exam was performed according to the departmental dose-optimization program which includes automated exposure control, adjustment of the mA and/or kV according to patient size and/or use of iterative reconstruction technique. COMPARISON:  08/14/2021 FINDINGS: Brain: There is atrophy and chronic small vessel disease changes. No acute intracranial abnormality. Specifically, no  hemorrhage, hydrocephalus, mass lesion, acute infarction, or significant intracranial injury. Vascular: No hyperdense vessel or unexpected calcification. Skull: No acute calvarial abnormality. Sinuses/Orbits: No acute findings. Complete opacification of the right maxillary sinus and scattered right ethmoid air cells is stable. Other: None IMPRESSION: Atrophy, chronic microvascular disease. No acute intracranial abnormality. Electronically Signed   By: Charlett Nose M.D.   On: 08/25/2022 20:40   DG Chest Port 1 View  Result Date: 08/25/2022 CLINICAL DATA:  Fall. EXAM: PORTABLE CHEST 1 VIEW COMPARISON:  08/14/2021, additional priors reviewed FINDINGS: Low lung volumes. Mild cardiomegaly. Stable mediastinal contours. Rounded left paratracheal density has been present intermittently in prior exam and is likely representing vascular overlap. Streaky bilateral perihilar scarring. No pleural  effusion or pneumothorax. No acute osseous abnormalities are seen. IMPRESSION: 1. Low lung volumes with mild cardiomegaly. 2. Bilateral perihilar scarring. Electronically Signed   By: Narda Rutherford M.D.   On: 08/25/2022 18:25   DG Foot Complete Right  Result Date: 08/25/2022 CLINICAL DATA:  Fall, unable to move right leg. EXAM: RIGHT FOOT COMPLETE - 3+ VIEW COMPARISON:  None Available. FINDINGS: Technically limited by difficulty with positioning. Hammertoe deformity of the toes. No evidence of acute fracture. Degenerative change of the first and second metatarsal phalangeal joints. No erosive change. IMPRESSION: 1. No acute fracture of the right foot. 2. Hammertoe deformity of the toes. Electronically Signed   By: Narda Rutherford M.D.   On: 08/25/2022 18:24   DG Pelvis 1-2 Views  Result Date: 08/25/2022 CLINICAL DATA:  Fall, unable to move right leg. EXAM: PELVIS - 1-2 VIEW; RIGHT FEMUR 2 VIEWS COMPARISON:  None Available. FINDINGS: Pelvis: Comminuted and displaced fracture of the right pubic body involving the superior and inferior pubic rami. Fracture likely extends to the pubic symphysis. No definite sacroiliac diastasis or visible sacral fracture. No hip dislocation. Femur: No femur fracture. Knee alignment is maintained. No focal bone abnormality. Vascular calcifications are seen. IMPRESSION: 1. Comminuted and displaced right pubic body fracture involving the superior and inferior pubic rami. Fracture likely extends to the pubic symphysis. 2. No fracture of the right hip joint or femur. Electronically Signed   By: Narda Rutherford M.D.   On: 08/25/2022 18:23   DG Femur Min 2 Views Right  Result Date: 08/25/2022 CLINICAL DATA:  Fall, unable to move right leg. EXAM: PELVIS - 1-2 VIEW; RIGHT FEMUR 2 VIEWS COMPARISON:  None Available. FINDINGS: Pelvis: Comminuted and displaced fracture of the right pubic body involving the superior and inferior pubic rami. Fracture likely extends to the pubic  symphysis. No definite sacroiliac diastasis or visible sacral fracture. No hip dislocation. Femur: No femur fracture. Knee alignment is maintained. No focal bone abnormality. Vascular calcifications are seen. IMPRESSION: 1. Comminuted and displaced right pubic body fracture involving the superior and inferior pubic rami. Fracture likely extends to the pubic symphysis. 2. No fracture of the right hip joint or femur. Electronically Signed   By: Narda Rutherford M.D.   On: 08/25/2022 18:23    Pending Labs Unresulted Labs (From admission, onward)    None       Vitals/Pain Today's Vitals   08/25/22 2120 08/25/22 2130 08/25/22 2145 08/25/22 2200  BP: (!) 200/94 (!) 205/89 (!) 201/97 (!) 164/84  Pulse:  70 78 64  Resp: 11 (!) 21 19 18   Temp:      TempSrc:      SpO2:  93% 96% (!) 86%  Weight:      Height:  PainSc:        Isolation Precautions No active isolations  Medications Medications  metoprolol tartrate (LOPRESSOR) tablet 25 mg (25 mg Oral Given 08/25/22 2145)  morphine (PF) 4 MG/ML injection 4 mg (4 mg Intravenous Given 08/25/22 1902)  ondansetron (ZOFRAN) injection 4 mg (4 mg Intravenous Given 08/25/22 1902)  morphine (PF) 4 MG/ML injection 4 mg (4 mg Intravenous Given 08/25/22 1958)  iohexol (OMNIPAQUE) 350 MG/ML injection 65 mL (65 mLs Intravenous Contrast Given 08/25/22 2038)  HYDROmorphone (DILAUDID) injection 0.5 mg (0.5 mg Intravenous Given 08/25/22 2146)    Mobility walks with device     Focused Assessments     R Recommendations: See Admitting Provider Note  Report given to:   Additional Notes: Pt came in for a fall at home the night before. Pt has a 20 G in the R AC. So far she's gotten dialudid, zofran, and morphine. She also got metoprolol for her elevated BP. Pt has a R hip fx and has been using the bedpan.

## 2022-08-25 NOTE — Hospital Course (Signed)
Brandi [name] is a 82 y.o. 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  Orthopedics planning nonoperative treatment.

## 2022-08-25 NOTE — Progress Notes (Signed)
Ortho Trauma Note  Reviewed imaging. Plan for nonoperative treatment of pelvis fracture. May be WBAT to RLE. Will formally consult in AM.  Roby Lofts, MD [facility] [phone] (office) [email or web address]

## 2022-08-25 NOTE — ED Provider Notes (Signed)
[name] Provider Note   CSN: [id number] Arrival date & time: 08/25/22  1631    History  Chief Complaint  Patient presents with   Fall    Pt coming from home after a mechanical fall last night in her kitchen. She did not hit her head, she is on coumadin. Pt complains of right hip/flank/groin pain. No shortening or rotation.     Brandi [name] is a 82 y.o. female past medical history significant for A-fib on Coumadin for evaluation mechanical fall.  Patient states yesterday evening prior to bed she was bent over cleaning out her fridge rater.  States she turned to reach for something behind her and her feet got twisted subsequently falling onto her right side.  She denies hitting her head, LOC or anticoagulation.  She was unable to get off the ground subsequently calling family members to come help her.  She typically walks with a walker.  States she has had pain to her right hip, flank, groin since the fall.  No numbness or weakness.  She also has pain to her right great toe.  No headache, neck pain, chest pain, shortness of breath.  Unable to bear weight secondary to pain to her groin and hip area.  HPI     Home Medications Prior to Admission medications   Medication Sig Start Date End Date Taking? Authorizing Provider  acetaminophen (TYLENOL) 500 MG tablet Take 1,000 mg by mouth every 6 (six) hours as needed for mild pain.    [provider]  azelastine (ASTELIN) 0.1 % nasal spray Place 2 sprays into both nostrils daily. 06/07/19   [provider]  cetirizine (ZYRTEC) 10 MG tablet Take 10 mg by mouth daily as needed for allergies.    [provider]  diltiazem (CARDIZEM) 60 MG tablet Take 1 tablet (60 mg total) by mouth 2 (two) times daily. 07/12/21 07/12/22  Leroy Sea, MD  ferrous sulfate 325 (65 FE) MG tablet Take 325 mg by mouth daily with breakfast.    [provider]  guanFACINE (TENEX) 2 MG  tablet SMARTSIG:1 Tablet(s) By Mouth Every Evening 09/19/21   [provider]  Lidocaine HCl (ASPERCREME LIDOCAINE) 4 % LIQD Apply 1 Application topically 2 (two) times daily as needed (knee pain).    [provider]  metoprolol tartrate (LOPRESSOR) 25 MG tablet Take 1 tablet (25 mg total) by mouth 2 (two) times daily. 04/13/22   Antonieta Iba, MD  Multiple Vitamins-Minerals (CENTRUM SILVER 50+WOMEN) TABS Take 1 tablet by mouth every evening.    [provider]  OXYGEN Inhale 2 L/min into the lungs continuous. Patient not taking: Reported on 04/13/2022    [provider]  pantoprazole (PROTONIX) 40 MG tablet Take 1 tablet (40 mg total) by mouth daily. 07/12/21   Leroy Sea, MD  Propylene Glycol (SYSTANE COMPLETE PF OP) Place 1 drop into both eyes 3 (three) times daily.    [provider]  rosuvastatin (CRESTOR) 20 MG tablet Take 10 mg by mouth See admin instructions. Take 10 mg by mouth in the evening on Sun/Wed only (twice a week) 10/10/19   [provider]  sertraline (ZOLOFT) 50 MG tablet Take 75 mg by mouth at bedtime.    [provider]  warfarin (COUMADIN) 2 MG tablet Take 1/2 tablet (1mg ) to 1 tablet (2mg ) by mouth daily or as directed by Anticoagulation Clinic. 04/20/22   Antonieta Iba, MD  Allergies    Amoxil [amoxicillin] and Celebrex [celecoxib]    Review of Systems   Review of Systems  Constitutional: Negative.   HENT: Negative.    Respiratory: Negative.    Cardiovascular: Negative.   Gastrointestinal: Negative.   Musculoskeletal:        Right groin, flank, hip pain  Skin:  Positive for color change.  Neurological: Negative.   All other systems reviewed and are negative.   Physical Exam Updated Vital Signs BP (!) 201/97   Pulse 78   Temp 97.9 F (36.6 C) (Oral)   Resp (!) 21   Ht 4\' 11"  (1.499 m)   Wt 65.3 kg   SpO2 93%   BMI 29.08 kg/m  Physical Exam Vitals and nursing note reviewed.   Constitutional:      General: She is not in acute distress.    Appearance: She is well-developed. She is not ill-appearing, toxic-appearing or diaphoretic.  HENT:     Head: Normocephalic and atraumatic.  Eyes:     Pupils: Pupils are equal, round, and reactive to light.  Neck:     Trachea: Trachea and phonation normal.     Comments: No midline cervical tenderness Cardiovascular:     Rate and Rhythm: Normal rate.     Pulses: Normal pulses.          Radial pulses are 2+ on the right side and 2+ on the left side.       Dorsalis pedis pulses are 2+ on the right side and 2+ on the left side.     Heart sounds: Normal heart sounds.  Pulmonary:     Effort: Pulmonary effort is normal. No respiratory distress.     Breath sounds: Normal breath sounds and air entry.     Comments: Clear bilaterally, speaks in full sentences without difficulty Chest:     Comments: Nontender chest wall, no crepitus or step-off Abdominal:     General: Bowel sounds are normal. There is no distension.     Palpations: Abdomen is soft.     Tenderness: There is no abdominal tenderness.       Comments: Tenderness to right inguinal crease/right pelvis.    Musculoskeletal:        General: Normal range of motion.     Cervical back: Full passive range of motion without pain and normal range of motion.     Comments: No midline C/T/L tenderness.  Nontender bilateral tib-fib, femur.  Tenderness to right inguinal crease, medial aspect right hip.  No shortening or rotation of legs.  Feet:     Comments: Tenderness to right great toe, overlying ecchymosis, no subungual hematoma.  Able to wiggle toes without difficulty. Skin:    General: Skin is warm and dry.     Comments: Yellow, old bruising  Neurological:     General: No focal deficit present.     Mental Status: She is alert.  Psychiatric:        Mood and Affect: Mood normal.     ED Results / Procedures / Treatments   Labs (all labs ordered are listed, but only  abnormal results are displayed) Labs Reviewed  CBC WITH DIFFERENTIAL/PLATELET - Abnormal; Notable for the following components:      Result Value   WBC 10.9 (*)    RBC 3.67 (*)    Hemoglobin 10.6 (*)    HCT 34.1 (*)    Neutro Abs 9.1 (*)    All other components within normal limits  COMPREHENSIVE METABOLIC  PANEL - Abnormal; Notable for the following components:   Chloride 94 (*)    Glucose, Bld 119 (*)    Creatinine, Ser 1.56 (*)    Albumin 3.4 (*)    GFR, Estimated 33 (*)    All other components within normal limits  PROTIME-INR - Abnormal; Notable for the following components:   Prothrombin Time 23.0 (*)    INR 2.0 (*)    All other components within normal limits    EKG EKG Interpretation Date/Time:  Tuesday August 25 2022 16:54:44 EDT Ventricular Rate:  77 PR Interval:  174 QRS Duration:  94 QT Interval:  386 QTC Calculation: 437 R Axis:   -8  Text Interpretation: Sinus rhythm Probable LVH with secondary repol abnrm Inferior infarct, old Confirmed by Alvino Blood ([id number]) on 08/25/2022 5:18:07 PM  Radiology CT L-SPINE NO CHARGE  Result Date: 08/25/2022 CLINICAL DATA:  Pain, fall EXAM: CT LUMBAR SPINE WITHOUT CONTRAST TECHNIQUE: Multidetector CT imaging of the lumbar spine was performed without intravenous contrast administration. Multiplanar CT image reconstructions were also generated. RADIATION DOSE REDUCTION: This exam was performed according to the departmental dose-optimization program which includes automated exposure control, adjustment of the mA and/or kV according to patient size and/or use of iterative reconstruction technique. COMPARISON:  Chest x-ray 08/14/2021. CT chest, abdomen and pelvis performed today. FINDINGS: Segmentation: 5 lumbar type vertebrae. Alignment: 5 mm of anterolisthesis of L5 on S1 related to facet disease. Vertebrae: Compression fracture at T11 and partially visualized at T10, stable since prior chest x-ray. No acute fracture. Paraspinal and  other soft tissues: Negative. Disc levels: Degenerative disc disease at L4-5 and L5-S1 with disc space narrowing and vacuum disc. Diffuse degenerative facet disease. Other: Fractures through the right sacral ala and right superior pubic ramus. IMPRESSION: Chronic compression fractures at T10 and T11. No acute lumbar spine fracture. Degenerative disc and facet disease in the lower lumbar spine. Right sacral ala fracture and fracture through the superior pubic ramus on the right. See discussion on CT chest, abdomen and pelvis report today. Electronically Signed   By: Charlett Nose M.D.   On: 08/25/2022 20:53   CT CHEST ABDOMEN PELVIS W CONTRAST  Result Date: 08/25/2022 CLINICAL DATA:  Polytrauma, blunt fall, right flank pain. Fall, right flank pain EXAM: CT CHEST, ABDOMEN, AND PELVIS WITH CONTRAST TECHNIQUE: Multidetector CT imaging of the chest, abdomen and pelvis was performed following the standard protocol during bolus administration of intravenous contrast. RADIATION DOSE REDUCTION: This exam was performed according to the departmental dose-optimization program which includes automated exposure control, adjustment of the mA and/or kV according to patient size and/or use of iterative reconstruction technique. CONTRAST:  65mL OMNIPAQUE IOHEXOL 350 MG/ML SOLN COMPARISON:  None Available. FINDINGS: CT CHEST FINDINGS Cardiovascular: Heart is normal size. Aorta is normal caliber. Coronary artery and aortic atherosclerosis. Mediastinum/Nodes: No mediastinal, hilar, or axillary adenopathy. Trachea and esophagus are unremarkable. Lungs/Pleura: Linear scarring in the lungs bilaterally. Clustered tree-in-bud nodular densities in both lower lobes and right middle lobe compatible with small airways disease/alveolitis. No confluent airspace opacities, effusions or pneumothorax. Musculoskeletal: Chest wall soft tissues are unremarkable. Moderate compression fracture at T10 and mild compression fracture at T11. These are  stable when compared to prior chest x-ray from 08/14/2021. CT ABDOMEN PELVIS FINDINGS Hepatobiliary: No hepatic injury or perihepatic hematoma. Gallbladder is unremarkable. Pancreas: No focal abnormality or ductal dilatation. Spleen: No splenic injury or perisplenic hematoma. Adrenals/Urinary Tract: No adrenal hemorrhage or renal injury identified. Bladder is distended. Mild fullness of  the left renal collecting system and ureter. No obstructing stones. Stomach/Bowel: Left colonic diverticulosis. No active diverticulitis. Stomach and small bowel decompressed, unremarkable. Vascular/Lymphatic: Diffuse aortoiliac atherosclerosis. No evidence of aneurysm or adenopathy. Reproductive: Prior hysterectomy.  No adnexal masses. Other: No free fluid or free air. Musculoskeletal: Fractures noted through the right superior and inferior pubic rami. Adjacent stranding compatible with small extraperitoneal hematoma. Fracture through the right side of the sacrum no proximal femoral abnormality. IMPRESSION: Fractures through the right superior and inferior pubic rami. Mild stranding adjacent to the superior pubic ramus and pubic symphysis compatible with small extraperitoneal hematoma. Right sacral ala fracture. No evidence of solid organ injury. No evidence of significant traumatic injury in the chest. Coronary artery disease, aortic atherosclerosis. Clustered tree-in-bud nodular densities in the lower lobes bilaterally as well as right middle lobe compatible with small airways disease/alveolitis. Areas of scarring throughout the lungs. Distention of the urinary bladder. Mild fullness of the left renal collecting system and ureter without obstructing stone. Left colonic diverticulosis. Electronically Signed   By: Charlett Nose M.D.   On: 08/25/2022 20:50   CT Cervical Spine Wo Contrast  Result Date: 08/25/2022 CLINICAL DATA:  Neck trauma (Age >= 65y).  Pain, fall EXAM: CT CERVICAL SPINE WITHOUT CONTRAST TECHNIQUE: Multidetector  CT imaging of the cervical spine was performed without intravenous contrast. Multiplanar CT image reconstructions were also generated. RADIATION DOSE REDUCTION: This exam was performed according to the departmental dose-optimization program which includes automated exposure control, adjustment of the mA and/or kV according to patient size and/or use of iterative reconstruction technique. COMPARISON:  None Available. FINDINGS: Alignment: No subluxation. Skull base and vertebrae: No acute fracture. No primary bone lesion or focal pathologic process. Soft tissues and spinal canal: No prevertebral fluid or swelling. No visible canal hematoma. Disc levels: Diffuse degenerative disc disease and facet disease, moderate to advanced. Upper chest: No acute findings Other: Bilateral nodules in the thyroid the largest 1 cm in the left thyroid lobe. Not clinically significant; no follow-up imaging recommended (ref: J Am Coll Radiol. 2015 Feb;12(2): 143-50). IMPRESSION: Diffuse degenerative disc and facet disease. No acute bony abnormality. Electronically Signed   By: Charlett Nose M.D.   On: 08/25/2022 20:41   CT HEAD WO CONTRAST ( )  Result Date: 08/25/2022 CLINICAL DATA:  Head trauma, moderate-severe.  Fall. EXAM: CT HEAD WITHOUT CONTRAST TECHNIQUE: Contiguous axial images were obtained from the base of the skull through the vertex without intravenous contrast. RADIATION DOSE REDUCTION: This exam was performed according to the departmental dose-optimization program which includes automated exposure control, adjustment of the mA and/or kV according to patient size and/or use of iterative reconstruction technique. COMPARISON:  08/14/2021 FINDINGS: Brain: There is atrophy and chronic small vessel disease changes. No acute intracranial abnormality. Specifically, no hemorrhage, hydrocephalus, mass lesion, acute infarction, or significant intracranial injury. Vascular: No hyperdense vessel or unexpected calcification. Skull: No  acute calvarial abnormality. Sinuses/Orbits: No acute findings. Complete opacification of the right maxillary sinus and scattered right ethmoid air cells is stable. Other: None IMPRESSION: Atrophy, chronic microvascular disease. No acute intracranial abnormality. Electronically Signed   By: Charlett Nose M.D.   On: 08/25/2022 20:40   DG Chest Port 1 View  Result Date: 08/25/2022 CLINICAL DATA:  Fall. EXAM: PORTABLE CHEST 1 VIEW COMPARISON:  08/14/2021, additional priors reviewed FINDINGS: Low lung volumes. Mild cardiomegaly. Stable mediastinal contours. Rounded left paratracheal density has been present intermittently in prior exam and is likely representing vascular overlap. Streaky bilateral perihilar scarring. No  pleural effusion or pneumothorax. No acute osseous abnormalities are seen. IMPRESSION: 1. Low lung volumes with mild cardiomegaly. 2. Bilateral perihilar scarring. Electronically Signed   By: Narda Rutherford M.D.   On: 08/25/2022 18:25   DG Foot Complete Right  Result Date: 08/25/2022 CLINICAL DATA:  Fall, unable to move right leg. EXAM: RIGHT FOOT COMPLETE - 3+ VIEW COMPARISON:  None Available. FINDINGS: Technically limited by difficulty with positioning. Hammertoe deformity of the toes. No evidence of acute fracture. Degenerative change of the first and second metatarsal phalangeal joints. No erosive change. IMPRESSION: 1. No acute fracture of the right foot. 2. Hammertoe deformity of the toes. Electronically Signed   By: Narda Rutherford M.D.   On: 08/25/2022 18:24   DG Pelvis 1-2 Views  Result Date: 08/25/2022 CLINICAL DATA:  Fall, unable to move right leg. EXAM: PELVIS - 1-2 VIEW; RIGHT FEMUR 2 VIEWS COMPARISON:  None Available. FINDINGS: Pelvis: Comminuted and displaced fracture of the right pubic body involving the superior and inferior pubic rami. Fracture likely extends to the pubic symphysis. No definite sacroiliac diastasis or visible sacral fracture. No hip dislocation. Femur: No  femur fracture. Knee alignment is maintained. No focal bone abnormality. Vascular calcifications are seen. IMPRESSION: 1. Comminuted and displaced right pubic body fracture involving the superior and inferior pubic rami. Fracture likely extends to the pubic symphysis. 2. No fracture of the right hip joint or femur. Electronically Signed   By: Narda Rutherford M.D.   On: 08/25/2022 18:23   DG Femur Min 2 Views Right  Result Date: 08/25/2022 CLINICAL DATA:  Fall, unable to move right leg. EXAM: PELVIS - 1-2 VIEW; RIGHT FEMUR 2 VIEWS COMPARISON:  None Available. FINDINGS: Pelvis: Comminuted and displaced fracture of the right pubic body involving the superior and inferior pubic rami. Fracture likely extends to the pubic symphysis. No definite sacroiliac diastasis or visible sacral fracture. No hip dislocation. Femur: No femur fracture. Knee alignment is maintained. No focal bone abnormality. Vascular calcifications are seen. IMPRESSION: 1. Comminuted and displaced right pubic body fracture involving the superior and inferior pubic rami. Fracture likely extends to the pubic symphysis. 2. No fracture of the right hip joint or femur. Electronically Signed   By: Narda Rutherford M.D.   On: 08/25/2022 18:23    Procedures Procedures    Medications Ordered in ED Medications  metoprolol tartrate (LOPRESSOR) tablet 25 mg (25 mg Oral Given 08/25/22 2145)  morphine (PF) 4 MG/ML injection 4 mg (4 mg Intravenous Given 08/25/22 1902)  ondansetron (ZOFRAN) injection 4 mg (4 mg Intravenous Given 08/25/22 1902)  morphine (PF) 4 MG/ML injection 4 mg (4 mg Intravenous Given 08/25/22 1958)  iohexol (OMNIPAQUE) 350 MG/ML injection 65 mL (65 mLs Intravenous Contrast Given 08/25/22 2038)  HYDROmorphone (DILAUDID) injection 0.5 mg (0.5 mg Intravenous Given 08/25/22 2146)    ED Course/ Medical Decision Making/ A&P    82 year old chronic anticoagulation for A-fib here for evaluation of fall.  Mechanical fall yesterday.  Needed help  getting off the ground.  Has been unable to walk since due to pain to her right pelvis when walking.  Typically walks with a walker.  He is neurovascularly intact.  No shortening or rotation of legs.  Heart and lungs clear.  Abdomen soft, nontender.  She denies hitting her head, LOC or anticoagulation.  Plan on labs, imaging, plan management and reassess  Labs and imaging personally viewed and interpreted:  CBC leukocytosis 10.9, hemoglobin 10.6, similar to prior Metabolic panel creatinine 1.56, similar  to prior INR 2.0 Imaging shows right superior and inferior pubic rami fracture as well as sacral alla fracture and small extraperitoneal hematoma  Reassessed.  Has needed multiple rounds of pain medication.  Unable to ambulate secondary to pain.  Will admit for further management  Discussed with orthopedics, Dr. Jena Gauss.  Will review imaging, rec medicine admit  Discussed with Dr. Allena Katz with hospitalist who will evaluate patient for admission  The patient appears reasonably stabilized for admission considering the current resources, flow, and capabilities available in the ED at this time, and I doubt any other [facility] requiring further screening and/or treatment in the ED prior to admission.                                     Medical Decision Making Amount and/or Complexity of Data Reviewed Independent Historian: EMS External Data Reviewed: labs, radiology and notes. Labs: ordered. Decision-making details documented in ED Course. Radiology: ordered and independent interpretation performed. Decision-making details documented in ED Course.  Risk OTC drugs. Prescription drug management. Parenteral controlled substances. Decision regarding hospitalization. Diagnosis or treatment significantly limited by social determinants of health.          Final Clinical Impression(s) / ED Diagnoses Final diagnoses:  Fall, initial encounter  Closed fracture of multiple pubic rami, right,  initial encounter [facility])  Chronic anticoagulation    Rx / DC Orders ED Discharge Orders     None         ,  A, PA-C 08/25/22 2156    Lonell Grandchild, MD 09/03/22 [id number]

## 2022-08-25 NOTE — Plan of Care (Signed)

## 2022-08-25 NOTE — H&P (Addendum)
History and Physical    Brandi [name] GEX:[id number] DOB: 1940-10-01 DOA: 08/25/2022  PCP: Gwenyth Bender, MD  Patient coming from: Home  I have personally briefly reviewed patient's old medical records in [facility]  Chief Complaint: Hip pain after fall at home  HPI: Brandi [name] is a 82 y.o. female with medical history significant for paroxysmal A-fib/flutter on Coumadin, CKD stage IIIb, HTN, HLD who presented to the ED for evaluation of hip pain after a fall at home.  Patient states that she was cleaning up after dinner yesterday (08/24/2022).  She was placing things back in the fridge when she lost her balance and fell to the ground.  She says she did have her cane on her person at the time.  She did not hit her head or lose consciousness.  She had significant right hip/groin pain and was unable to get up on her own.  She had to call out to family for assistance who were able to help get her up.  Since then her pain has persisted and she has been unable to ambulate due to her pain.  She typically ambulates with use of walker at baseline.  She denies any recent chest pain, dyspnea, nausea, vomiting.  ED Course  Labs/Imaging on admission: I have personally reviewed following labs and imaging studies.  Initial vitals showed BP 223/96, pulse 73, RR 17, temp 98.6 F, SpO2 92% on room air.  Labs show WBC 10.9, hemoglobin 10.6, platelets 172,000, sodium 137, potassium 3.8, bicarb 28, BUN 22, creatinine 1.56, serum glucose 119, INR 2.0.  Extensive traumatic injury notable for comminuted and displaced right pubic body fracture involving the superior and inferior pubic rami.  Right sacral ala fracture.  Compression fractures of T10 and T11.  No significant traumatic injury in the chest.  CT head and cervical spine without acute abnormality.  Patient was given IV morphine 4 mg x 2, IV Dilaudid 0.5 mg, IV Zofran.  Orthopedics, Dr. Jena Gauss consulted and recommended nonoperative treatment of pelvic  fracture, WBAT to RLE, they will formally consult in AM.  The hospitalist service was consulted to admit for further evaluation and management.  Review of Systems: All systems reviewed and are negative except as documented in history of present illness above.   Past Medical History:  Diagnosis Date   Allergy    Hyperlipidemia    Hypertension    Neuromuscular disorder (HCC)    Venous insufficiency     Past Surgical History:  Procedure Laterality Date   ANKLE SURGERY Left    KNEE SURGERY Left    VARICOSE VEIN SURGERY      Social History:  reports that she has never smoked. She has never used smokeless tobacco. She reports that she does not drink alcohol and does not use drugs.  Allergies  Allergen Reactions   Amoxil [Amoxicillin] Diarrhea   Celebrex [Celecoxib] Other (See Comments)    Bruising and stomach pain    Family History  Problem Relation Age of Onset   Heart attack Father      Prior to Admission medications   Medication Sig Start Date End Date Taking? Authorizing Provider  acetaminophen (TYLENOL) 500 MG tablet Take 1,000 mg by mouth every 6 (six) hours as needed for mild pain.    [provider]  azelastine (ASTELIN) 0.1 % nasal spray Place 2 sprays into both nostrils daily. 06/07/19   [provider]  cetirizine (ZYRTEC) 10 MG tablet Take 10 mg by mouth daily as needed  for allergies.    [provider]  diltiazem (CARDIZEM) 60 MG tablet Take 1 tablet (60 mg total) by mouth 2 (two) times daily. 07/12/21 07/12/22  Leroy Sea, MD  ferrous sulfate 325 (65 FE) MG tablet Take 325 mg by mouth daily with breakfast.    [provider]  guanFACINE (TENEX) 2 MG tablet SMARTSIG:1 Tablet(s) By Mouth Every Evening 09/19/21   [provider]  Lidocaine HCl (ASPERCREME LIDOCAINE) 4 % LIQD Apply 1 Application topically 2 (two) times daily as needed (knee pain).    [provider]  metoprolol tartrate (LOPRESSOR) 25 MG tablet  Take 1 tablet (25 mg total) by mouth 2 (two) times daily. 04/13/22   Antonieta Iba, MD  Multiple Vitamins-Minerals (CENTRUM SILVER 50+WOMEN) TABS Take 1 tablet by mouth every evening.    [provider]  OXYGEN Inhale 2 L/min into the lungs continuous. Patient not taking: Reported on 04/13/2022    [provider]  pantoprazole (PROTONIX) 40 MG tablet Take 1 tablet (40 mg total) by mouth daily. 07/12/21   Leroy Sea, MD  Propylene Glycol (SYSTANE COMPLETE PF OP) Place 1 drop into both eyes 3 (three) times daily.    [provider]  rosuvastatin (CRESTOR) 20 MG tablet Take 10 mg by mouth See admin instructions. Take 10 mg by mouth in the evening on Sun/Wed only (twice a week) 10/10/19   [provider]  sertraline (ZOLOFT) 50 MG tablet Take 75 mg by mouth at bedtime.    [provider]  warfarin (COUMADIN) 2 MG tablet Take 1/2 tablet (1mg ) to 1 tablet (2mg ) by mouth daily or as directed by Anticoagulation Clinic. 04/20/22   Antonieta Iba, MD    Physical Exam: Vitals:   08/25/22 2145 08/25/22 2200 08/25/22 2215 08/25/22 2230  BP: (!) 201/97 (!) 164/84 (!) 162/69 (!) 150/62  Pulse: 78 64 69 79  Resp: 19 18 16 15   Temp:      TempSrc:      SpO2: 96% (!) 86% 98% 96%  Weight:      Height:       Constitutional: Resting in bed, NAD, calm, comfortable Eyes: EOMI, lids and conjunctivae normal ENMT: Mucous membranes are moist. Posterior pharynx clear of any exudate or lesions.Normal dentition.  Neck: normal, supple, no masses. Respiratory: clear to auscultation bilaterally, no wheezing, no crackles. Normal respiratory effort. No accessory muscle use.  Cardiovascular: Regular rate and rhythm, no murmurs / rubs / gallops. No extremity edema. 2+ pedal pulses. Abdomen: no tenderness, no masses palpated. Musculoskeletal: no clubbing / cyanosis. No joint deformity upper and lower extremities. ROM diminished RLE due to pelvic fracture. Skin: no rashes,  lesions, ulcers. No induration Neurologic: Sensation intact. Strength diminished RLE due to pelvic fracture otherwise strength intact other extremities. Psychiatric: Normal judgment and insight. Alert and oriented x 3. Normal mood.   EKG: Personally reviewed. Sinus rhythm, rate 77, no acute ischemic changes.  Rate is faster when compared to previous.  Assessment/Plan Principal Problem:   Closed traumatic fractures of multiple bones of right hip and pelvis, initial encounter [facility]) Active Problems:   Paroxysmal atrial fibrillation (HCC)   Chronic kidney disease, stage 3b (HCC)   Mixed hyperlipidemia   Compression fracture of T10 vertebra [facility])   Essential hypertension   Brandi [name] is a 82 y.o. 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  Orthopedics planning nonoperative treatment.  Assessment and Plan: Right superior and inferior pubic rami fractures Right sacral ala fracture Occurring after mechanical fall at home.  Orthopedics recommending nonoperative management, WBAT to RLE. -PT/OT eval -Analgesics as needed -Orthopedics to consult in a.m.  Paroxysmal atrial fibrillation/flutter: In sinus rhythm with controlled rate on admission.  On chronic anticoagulation with Coumadin.  INR is 2.0.  CT with small extraperitoneal hematoma adjacent to pubic ramus and symphysis. -Hold Coumadin tonight, if remains stable can likely resume tomorrow -Continue metoprolol 25 mg twice daily -Continue diltiazem 60 mg twice daily -Repeat INR in a.m.  CKD stage IIIb: Renal function at baseline.  Continue to monitor.  T10/T11 compression fractures: Noted on CT imaging.  Patient denies focal back pain.  PT/OT and pain control as above.  Hypertension: Continue metoprolol and diltiazem.  Hyperlipidemia: Continue rosuvastatin.  Depression: Continue sertraline.    DVT prophylaxis: On chronic Coumadin, INR 2.0, resume if remains stable without significant signs of bleeding Code Status: DNR, confirmed with patient on admission Family Communication: Discussed with patient, she has discussed with family Disposition Plan: From home, dispo pending clinical progress Consults called: Orthopedics Severity of Illness: The appropriate patient status for this patient is OBSERVATION. Observation status is judged to be reasonable and necessary in order to provide the required intensity of service to ensure the patient's safety. The patient's presenting symptoms, physical exam findings, and initial radiographic and laboratory data in the context of their medical condition is felt to place them at decreased risk for further clinical deterioration. Furthermore, it is anticipated that the patient will be medically stable for discharge from the hospital within 2 midnights of admission.   Darreld Mclean MD [facility]  If 7PM-7AM, please contact night-coverage [email or web address]  08/25/2022, 11:12 PM

## 2022-08-25 NOTE — ED Notes (Signed)
Pt gone to CT 

## 2022-08-26 DIAGNOSIS — S3282XA Multiple fractures of pelvis without disruption of pelvic ring, initial encounter for closed fracture: Secondary | ICD-10-CM | POA: Diagnosis not present

## 2022-08-26 DIAGNOSIS — S72001A Fracture of unspecified part of neck of right femur, initial encounter for closed fracture: Secondary | ICD-10-CM | POA: Diagnosis not present

## 2022-08-26 MED ORDER — WARFARIN SODIUM 1 MG PO TABS
1.0000 mg | ORAL_TABLET | Freq: Once | ORAL | Status: AC
  Administered 2022-08-26: 1 mg via ORAL
  Filled 2022-08-26: qty 1

## 2022-08-26 MED ORDER — WARFARIN - PHARMACIST DOSING INPATIENT: Freq: Every day | Status: DC

## 2022-08-26 MED ORDER — DILTIAZEM HCL ER 60 MG PO CP12
60.0000 mg | ORAL_CAPSULE | Freq: Two times a day (BID) | ORAL | Status: DC
  Administered 2022-08-26 – 2022-08-30 (×9): 60 mg via ORAL
  Filled 2022-08-26 (×10): qty 1

## 2022-08-26 NOTE — Consult Note (Signed)
Reason for Consult:Pelvic fxs Referring Physician: Carma Leaven Time called: 0809 Time at bedside: 0848   Brandi [name] is an 82 y.o. female.  HPI: Valire was putting some things away in the fridge when she slipped and fell. She had immediate pelvic pain and could not get up. She was brought to the ED where x-rays showed right sup/inf pubic rami fxs. She was admitted and orthopedic surgery was consulted. She lives at home with her husband and son and uses both a RW and cane to ambulate.  Past Medical History:  Diagnosis Date   Allergy    Hyperlipidemia    Hypertension    Neuromuscular disorder (HCC)    Venous insufficiency     Past Surgical History:  Procedure Laterality Date   ANKLE SURGERY Left    KNEE SURGERY Left    VARICOSE VEIN SURGERY      Family History  Problem Relation Age of Onset   Heart attack Father     Social History:  reports that she has never smoked. She has never used smokeless tobacco. She reports that she does not drink alcohol and does not use drugs.  Allergies:  Allergies  Allergen Reactions   Amoxil [Amoxicillin] Diarrhea   Celebrex [Celecoxib] Other (See Comments)    Bruising and stomach pain    Medications: I have reviewed the patient's current medications.  Results for orders placed or performed during the hospital encounter of 08/25/22 (from the past 48 hour(s))  CBC with Differential     Status: Abnormal   Collection Time: 08/25/22  5:08 PM  Result Value Ref Range   WBC 10.9 (H) 4.0 - 10.5 K/uL   RBC 3.67 (L) 3.87 - 5.11 MIL/uL   Hemoglobin 10.6 (L) 12.0 - 15.0 g/dL   HCT 72.5 (L) 36.6 - 44.0 %   MCV 92.9 80.0 - 100.0 fL   MCH 28.9 26.0 - 34.0 pg   MCHC 31.1 30.0 - 36.0 g/dL   RDW 34.7 42.5 - 95.6 %   Platelets 172 150 - 400 K/uL   nRBC 0.0 0.0 - 0.2 %   Neutrophils Relative % 82 %   Neutro Abs 9.1 (H) 1.7 - 7.7 K/uL   Lymphocytes Relative 10 %   Lymphs Abs 1.0 0.7 - 4.0 K/uL   Monocytes Relative 5 %   Monocytes Absolute 0.5  0.1 - 1.0 K/uL   Eosinophils Relative 2 %   Eosinophils Absolute 0.2 0.0 - 0.5 K/uL   Basophils Relative 0 %   Basophils Absolute 0.0 0.0 - 0.1 K/uL   Immature Granulocytes 1 %   Abs Immature Granulocytes 0.05 0.00 - 0.07 K/uL    Comment: Performed at [facility], [address] [place], [place] [address]  Comprehensive metabolic panel     Status: Abnormal   Collection Time: 08/25/22  5:08 PM  Result Value Ref Range   Sodium 137 135 - 145 mmol/L   Potassium 3.8 3.5 - 5.1 mmol/L   Chloride 94 (L) 98 - 111 mmol/L   CO2 28 22 - 32 mmol/L   Glucose, Bld 119 (H) 70 - 99 mg/dL    Comment: Glucose reference range applies only to samples taken after fasting for at least 8 hours.   BUN 22 8 - 23 mg/dL   Creatinine, Ser 4.33 (H) 0.44 - 1.00 mg/dL   Calcium 9.4 8.9 - 29.5 mg/dL   Total Protein 6.9 6.5 - 8.1 g/dL   Albumin 3.4 (L) 3.5 - 5.0 g/dL  AST 28 15 - 41 U/L   ALT 20 0 - 44 U/L   Alkaline Phosphatase 98 38 - 126 U/L   Total Bilirubin 0.8 0.3 - 1.2 mg/dL   GFR, Estimated 33 (L) >60 mL/min    Comment: (NOTE) Calculated using the CKD-EPI Creatinine Equation (2021)    Anion gap 15 5 - 15    Comment: Performed at [facility], [address] [place], [place] [address]  Protime-INR     Status: Abnormal   Collection Time: 08/25/22  5:08 PM  Result Value Ref Range   Prothrombin Time 23.0 (H) 11.4 - 15.2 seconds   INR 2.0 (H) 0.8 - 1.2    Comment: (NOTE) INR goal varies based on device and disease states. Performed at [facility], [address] [place], [place] [address]   CBC     Status: Abnormal   Collection Time: 08/26/22  1:15 AM  Result Value Ref Range   WBC 12.3 (H) 4.0 - 10.5 K/uL   RBC 3.43 (L) 3.87 - 5.11 MIL/uL   Hemoglobin 10.1 (L) 12.0 - 15.0 g/dL   HCT 83.1 (L) 51.7 - 61.6 %   MCV 93.9 80.0 - 100.0 fL   MCH 29.4 26.0 - 34.0 pg   MCHC 31.4 30.0 - 36.0 g/dL   RDW 07.3 71.0 - 62.6 %   Platelets 179 150 - 400 K/uL   nRBC 0.0 0.0 - 0.2 %     Comment: Performed at [facility], [address] [place], [place] [address]  Basic metabolic panel     Status: Abnormal   Collection Time: 08/26/22  1:15 AM  Result Value Ref Range   Sodium 135 135 - 145 mmol/L   Potassium 4.7 3.5 - 5.1 mmol/L   Chloride 96 (L) 98 - 111 mmol/L   CO2 26 22 - 32 mmol/L   Glucose, Bld 115 (H) 70 - 99 mg/dL    Comment: Glucose reference range applies only to samples taken after fasting for at least 8 hours.   BUN 19 8 - 23 mg/dL   Creatinine, Ser 6.27 (H) 0.44 - 1.00 mg/dL   Calcium 8.9 8.9 - 03.5 mg/dL   GFR, Estimated 34 (L) >60 mL/min    Comment: (NOTE) Calculated using the CKD-EPI Creatinine Equation (2021)    Anion gap 13 5 - 15    Comment: Performed at [facility], [address] [place], [place] [address]  Protime-INR     Status: Abnormal   Collection Time: 08/26/22  1:15 AM  Result Value Ref Range   Prothrombin Time 25.7 (H) 11.4 - 15.2 seconds   INR 2.3 (H) 0.8 - 1.2    Comment: (NOTE) INR goal varies based on device and disease states. Performed at [facility], [address], [place], [place] [address]     CT L-SPINE NO CHARGE  Result Date: 08/25/2022 CLINICAL DATA:  Pain, fall EXAM: CT LUMBAR SPINE WITHOUT CONTRAST TECHNIQUE: Multidetector CT imaging of the lumbar spine was performed without intravenous contrast administration. Multiplanar CT image reconstructions were also generated. RADIATION DOSE REDUCTION: This exam was performed according to the departmental dose-optimization program which includes automated exposure control, adjustment of the mA and/or kV according to patient size and/or use of iterative reconstruction technique. COMPARISON:  Chest x-ray 08/14/2021. CT chest, abdomen and pelvis performed today. FINDINGS: Segmentation: 5 lumbar type vertebrae. Alignment: 5 mm of anterolisthesis of L5 on S1 related to facet disease.  Vertebrae: Compression fracture at T11 and partially visualized at T10, stable since  prior chest x-ray. No acute fracture. Paraspinal and other soft tissues: Negative. Disc levels: Degenerative disc disease at L4-5 and L5-S1 with disc space narrowing and vacuum disc. Diffuse degenerative facet disease. Other: Fractures through the right sacral ala and right superior pubic ramus. IMPRESSION: Chronic compression fractures at T10 and T11. No acute lumbar spine fracture. Degenerative disc and facet disease in the lower lumbar spine. Right sacral ala fracture and fracture through the superior pubic ramus on the right. See discussion on CT chest, abdomen and pelvis report today. Electronically Signed   By: Charlett Nose M.D.   On: 08/25/2022 20:53   CT CHEST ABDOMEN PELVIS W CONTRAST  Result Date: 08/25/2022 CLINICAL DATA:  Polytrauma, blunt fall, right flank pain. Fall, right flank pain EXAM: CT CHEST, ABDOMEN, AND PELVIS WITH CONTRAST TECHNIQUE: Multidetector CT imaging of the chest, abdomen and pelvis was performed following the standard protocol during bolus administration of intravenous contrast. RADIATION DOSE REDUCTION: This exam was performed according to the departmental dose-optimization program which includes automated exposure control, adjustment of the mA and/or kV according to patient size and/or use of iterative reconstruction technique. CONTRAST:  65mL OMNIPAQUE IOHEXOL 350 MG/ML SOLN COMPARISON:  None Available. FINDINGS: CT CHEST FINDINGS Cardiovascular: Heart is normal size. Aorta is normal caliber. Coronary artery and aortic atherosclerosis. Mediastinum/Nodes: No mediastinal, hilar, or axillary adenopathy. Trachea and esophagus are unremarkable. Lungs/Pleura: Linear scarring in the lungs bilaterally. Clustered tree-in-bud nodular densities in both lower lobes and right middle lobe compatible with small airways disease/alveolitis. No confluent airspace opacities, effusions or pneumothorax. Musculoskeletal: Chest wall soft tissues are unremarkable. Moderate compression fracture at T10  and mild compression fracture at T11. These are stable when compared to prior chest x-ray from 08/14/2021. CT ABDOMEN PELVIS FINDINGS Hepatobiliary: No hepatic injury or perihepatic hematoma. Gallbladder is unremarkable. Pancreas: No focal abnormality or ductal dilatation. Spleen: No splenic injury or perisplenic hematoma. Adrenals/Urinary Tract: No adrenal hemorrhage or renal injury identified. Bladder is distended. Mild fullness of the left renal collecting system and ureter. No obstructing stones. Stomach/Bowel: Left colonic diverticulosis. No active diverticulitis. Stomach and small bowel decompressed, unremarkable. Vascular/Lymphatic: Diffuse aortoiliac atherosclerosis. No evidence of aneurysm or adenopathy. Reproductive: Prior hysterectomy.  No adnexal masses. Other: No free fluid or free air. Musculoskeletal: Fractures noted through the right superior and inferior pubic rami. Adjacent stranding compatible with small extraperitoneal hematoma. Fracture through the right side of the sacrum no proximal femoral abnormality. IMPRESSION: Fractures through the right superior and inferior pubic rami. Mild stranding adjacent to the superior pubic ramus and pubic symphysis compatible with small extraperitoneal hematoma. Right sacral ala fracture. No evidence of solid organ injury. No evidence of significant traumatic injury in the chest. Coronary artery disease, aortic atherosclerosis. Clustered tree-in-bud nodular densities in the lower lobes bilaterally as well as right middle lobe compatible with small airways disease/alveolitis. Areas of scarring throughout the lungs. Distention of the urinary bladder. Mild fullness of the left renal collecting system and ureter without obstructing stone. Left colonic diverticulosis. Electronically Signed   By: Charlett Nose M.D.   On: 08/25/2022 20:50   CT Cervical Spine Wo Contrast  Result Date: 08/25/2022 CLINICAL DATA:  Neck trauma (Age >= 65y).  Pain, fall EXAM: CT CERVICAL  SPINE WITHOUT CONTRAST TECHNIQUE: Multidetector CT imaging of the cervical spine was performed without intravenous contrast. Multiplanar CT image reconstructions were also generated. RADIATION DOSE REDUCTION: This exam was performed according to the  departmental dose-optimization program which includes automated exposure control, adjustment of the mA and/or kV according to patient size and/or use of iterative reconstruction technique. COMPARISON:  None Available. FINDINGS: Alignment: No subluxation. Skull base and vertebrae: No acute fracture. No primary bone lesion or focal pathologic process. Soft tissues and spinal canal: No prevertebral fluid or swelling. No visible canal hematoma. Disc levels: Diffuse degenerative disc disease and facet disease, moderate to advanced. Upper chest: No acute findings Other: Bilateral nodules in the thyroid the largest 1 cm in the left thyroid lobe. Not clinically significant; no follow-up imaging recommended (ref: J Am Coll Radiol. 2015 Feb;12(2): 143-50). IMPRESSION: Diffuse degenerative disc and facet disease. No acute bony abnormality. Electronically Signed   By: Charlett Nose M.D.   On: 08/25/2022 20:41   CT HEAD WO CONTRAST ( )  Result Date: 08/25/2022 CLINICAL DATA:  Head trauma, moderate-severe.  Fall. EXAM: CT HEAD WITHOUT CONTRAST TECHNIQUE: Contiguous axial images were obtained from the base of the skull through the vertex without intravenous contrast. RADIATION DOSE REDUCTION: This exam was performed according to the departmental dose-optimization program which includes automated exposure control, adjustment of the mA and/or kV according to patient size and/or use of iterative reconstruction technique. COMPARISON:  08/14/2021 FINDINGS: Brain: There is atrophy and chronic small vessel disease changes. No acute intracranial abnormality. Specifically, no hemorrhage, hydrocephalus, mass lesion, acute infarction, or significant intracranial injury. Vascular: No  hyperdense vessel or unexpected calcification. Skull: No acute calvarial abnormality. Sinuses/Orbits: No acute findings. Complete opacification of the right maxillary sinus and scattered right ethmoid air cells is stable. Other: None IMPRESSION: Atrophy, chronic microvascular disease. No acute intracranial abnormality. Electronically Signed   By: Charlett Nose M.D.   On: 08/25/2022 20:40   DG Chest Port 1 View  Result Date: 08/25/2022 CLINICAL DATA:  Fall. EXAM: PORTABLE CHEST 1 VIEW COMPARISON:  08/14/2021, additional priors reviewed FINDINGS: Low lung volumes. Mild cardiomegaly. Stable mediastinal contours. Rounded left paratracheal density has been present intermittently in prior exam and is likely representing vascular overlap. Streaky bilateral perihilar scarring. No pleural effusion or pneumothorax. No acute osseous abnormalities are seen. IMPRESSION: 1. Low lung volumes with mild cardiomegaly. 2. Bilateral perihilar scarring. Electronically Signed   By: Narda Rutherford M.D.   On: 08/25/2022 18:25   DG Foot Complete Right  Result Date: 08/25/2022 CLINICAL DATA:  Fall, unable to move right leg. EXAM: RIGHT FOOT COMPLETE - 3+ VIEW COMPARISON:  None Available. FINDINGS: Technically limited by difficulty with positioning. Hammertoe deformity of the toes. No evidence of acute fracture. Degenerative change of the first and second metatarsal phalangeal joints. No erosive change. IMPRESSION: 1. No acute fracture of the right foot. 2. Hammertoe deformity of the toes. Electronically Signed   By: Narda Rutherford M.D.   On: 08/25/2022 18:24   DG Pelvis 1-2 Views  Result Date: 08/25/2022 CLINICAL DATA:  Fall, unable to move right leg. EXAM: PELVIS - 1-2 VIEW; RIGHT FEMUR 2 VIEWS COMPARISON:  None Available. FINDINGS: Pelvis: Comminuted and displaced fracture of the right pubic body involving the superior and inferior pubic rami. Fracture likely extends to the pubic symphysis. No definite sacroiliac diastasis or  visible sacral fracture. No hip dislocation. Femur: No femur fracture. Knee alignment is maintained. No focal bone abnormality. Vascular calcifications are seen. IMPRESSION: 1. Comminuted and displaced right pubic body fracture involving the superior and inferior pubic rami. Fracture likely extends to the pubic symphysis. 2. No fracture of the right hip joint or femur. Electronically Signed  By: Narda Rutherford M.D.   On: 08/25/2022 18:23   DG Femur Min 2 Views Right  Result Date: 08/25/2022 CLINICAL DATA:  Fall, unable to move right leg. EXAM: PELVIS - 1-2 VIEW; RIGHT FEMUR 2 VIEWS COMPARISON:  None Available. FINDINGS: Pelvis: Comminuted and displaced fracture of the right pubic body involving the superior and inferior pubic rami. Fracture likely extends to the pubic symphysis. No definite sacroiliac diastasis or visible sacral fracture. No hip dislocation. Femur: No femur fracture. Knee alignment is maintained. No focal bone abnormality. Vascular calcifications are seen. IMPRESSION: 1. Comminuted and displaced right pubic body fracture involving the superior and inferior pubic rami. Fracture likely extends to the pubic symphysis. 2. No fracture of the right hip joint or femur. Electronically Signed   By: Narda Rutherford M.D.   On: 08/25/2022 18:23    Review of Systems  HENT:  Negative for ear discharge, ear pain, hearing loss and tinnitus.   Eyes:  Negative for photophobia and pain.  Respiratory:  Negative for cough and shortness of breath.   Cardiovascular:  Negative for chest pain.  Gastrointestinal:  Negative for abdominal pain, nausea and vomiting.  Genitourinary:  Negative for dysuria, flank pain, frequency and urgency.  Musculoskeletal:  Positive for arthralgias (Pelvis). Negative for back pain, myalgias and neck pain.  Neurological:  Negative for dizziness and headaches.  Hematological:  Does not bruise/bleed easily.  Psychiatric/Behavioral:  The patient is not nervous/anxious.    Blood  pressure (!) 176/78, pulse 72, temperature 98.3 F (36.8 C), resp. rate 17, height 4\' 11"  (1.499 m), weight 65.3 kg, SpO2 97%. Physical Exam Constitutional:      General: She is not in acute distress.    Appearance: She is well-developed. She is not diaphoretic.  HENT:     Head: Normocephalic and atraumatic.  Eyes:     General: No scleral icterus.       Right eye: No discharge.        Left eye: No discharge.     Conjunctiva/sclera: Conjunctivae normal.  Cardiovascular:     Rate and Rhythm: Normal rate and regular rhythm.  Pulmonary:     Effort: Pulmonary effort is normal. No respiratory distress.  Musculoskeletal:     Cervical back: Normal range of motion.     Comments: RLE No traumatic wounds, ecchymosis, or rash  Mod TTP knee  No knee or ankle effusion  Knee stable to varus/ valgus and anterior/posterior stress  Sens DPN, SPN, TN intact  Motor EHL, ext, flex, evers 5/5  DP 1+, PT 1+, No significant edema  Skin:    General: Skin is warm and dry.  Neurological:     Mental Status: She is alert.  Psychiatric:        Mood and Affect: Mood normal.        Behavior: Behavior normal.     Assessment/Plan: Right sup/inf pubic rami fxs -- Plan non-operative management with WBAT BLE. F/u with Dr. Jena Gauss in 3 weeks.    Freeman Caldron, PA-C Orthopedic Surgery [phone] 08/26/2022, 9:02 AM

## 2022-08-26 NOTE — TOC CAGE-AID Note (Signed)
Transition of Care [facility]) - CAGE-AID Screening   Patient Details  Name: Brandi [name] MRN: [id number] Date of Birth: 1940-03-09  [facility] CM/SW Contact:    Janora Norlander, RN Phone Number: [phone] 08/26/2022, 1:48 PM   Clinical Narrative: Pt here after sustaining a hip fx due to a fall.  Pt denies drugs or alcohol use.  No resources needed.  Screening complete.   CAGE-AID Screening:    Have You Ever Felt You Ought to Cut Down on Your Drinking or Drug Use?: No Have People Annoyed You By Critizing Your Drinking Or Drug Use?: No Have You Felt Bad Or Guilty About Your Drinking Or Drug Use?: No Have You Ever Had a Drink or Used Drugs First Thing In The Morning to Steady Your Nerves or to Get Rid of a Hangover?: No CAGE-AID Score: 0  Substance Abuse Education Offered: No

## 2022-08-26 NOTE — Progress Notes (Signed)
PROGRESS NOTE    Brandi [name]  VWU:[id number] DOB: 07-Jun-1940 DOA: 08/25/2022 PCP: Gwenyth Bender, MD   Brief Narrative:  Brandi [name] is a 82 y.o. female with medical history significant for paroxysmal A-fib/flutter on Coumadin, CKD stage IIIb, HTN, HLD who presented to the ED for evaluation of hip pain after a fall at home - notably unable to ambulate due to instability and pain.   Assessment & Plan:   Principal Problem:   Closed traumatic fractures of multiple bones of right hip and pelvis, initial encounter [facility] Active Problems:   Paroxysmal atrial fibrillation (HCC)   Chronic kidney disease, stage 3b (HCC)   Mixed hyperlipidemia   Compression fracture of T10 vertebra (HCC)   Essential hypertension  Right superior and inferior pubic rami fractures Right sacral ala fracture Intractable pain Ambulatory dysfunction - Fractures confirmed on imaging - Ortho recommends non-operative management - weight bearing as tolerated - Dr Jena Gauss outpatient follow-up in 3 weeks -PT/OT eval ongoing -Analgesics as needed   Paroxysmal atrial fibrillation/flutter: In sinus rhythm with controlled rate on admission.  On chronic anticoagulation with Coumadin.  INR is 2.0.  CT with small extraperitoneal hematoma adjacent to pubic ramus and symphysis. -Resume coumadin (pharmacy consulted to manage) -Continue metoprolol 25 mg twice daily -Continue diltiazem 60 mg twice daily   CKD stage IIIb: Renal function at baseline.  Continue to monitor.   T10/T11 compression fractures: Noted on CT imaging.  Patient denies focal back pain.  PT/OT and pain control as above.   Hypertension: Continue metoprolol and diltiazem.   Hyperlipidemia: Continue rosuvastatin.   Depression: Continue sertraline.  DVT prophylaxis: warfarin (COUMADIN) tablet 1 mg  Code Status:   Code Status: DNR Family Communication: None present  Status is: Inpt  Dispo: The patient is from: Home              Anticipated d/c is to:  TBD              Anticipated d/c date is: 24-48h              Patient currently is medically stable for discharge  Consultants:  Ortho  Procedures:  None  Antimicrobials:  None   Subjective: No acute issues/events overnight, pain well controlled. Tolerated standing up to chair poorly this morning.  Objective: Vitals:   08/25/22 2215 08/25/22 2230 08/25/22 2353 08/26/22 0438  BP: (!) 162/69 (!) 150/62 (!) 164/75 (!) 177/79  Pulse: 69 79 69 70  Resp: 16 15 20 18   Temp:   98 F (36.7 C) 98.6 F (37 C)  TempSrc:   Oral   SpO2: 98% 96% 96% 100%  Weight:      Height:        Intake/Output Summary (Last 24 hours) at 08/26/2022 0749 Last data filed at 08/25/2022 2228 Gross per 24 hour  Intake --  Output 1200 ml  Net -1200 ml   Filed Weights   08/25/22 1642  Weight: 65.3 kg    Examination:  General exam: Appears calm and comfortable  Respiratory system: Clear to auscultation. Respiratory effort normal. Cardiovascular system: RRR Gastrointestinal system: Abdomen is nondistended, soft and nontender. Central nervous system: Alert and oriented. No focal neurological deficits. Extremities: Symmetric 5 x 5 power.  Data Reviewed: I have personally reviewed following labs and imaging studies  CBC: Recent Labs  Lab 08/25/22 1708 08/26/22 0115  WBC 10.9* 12.3*  NEUTROABS 9.1*  --   HGB 10.6* 10.1*  HCT 34.1* 32.2*  MCV 92.9 93.9  PLT 172 179   Basic Metabolic Panel: Recent Labs  Lab 08/25/22 1708 08/26/22 0115  NA 137 135  K 3.8 4.7  CL 94* 96*  CO2 28 26  GLUCOSE 119* 115*  BUN 22 19  CREATININE 1.56* 1.54*  CALCIUM 9.4 8.9   GFR: Estimated Creatinine Clearance: 23.1 mL/min (A) (by C-G formula based on SCr of 1.54 mg/dL (H)). Liver Function Tests: Recent Labs  Lab 08/25/22 1708  AST 28  ALT 20  ALKPHOS 98  BILITOT 0.8  PROT 6.9  ALBUMIN 3.4*   Coagulation Profile: Recent Labs  Lab 08/25/22 1708 08/26/22 0115  INR 2.0* 2.3*   Radiology  Studies: CT L-SPINE NO CHARGE  Result Date: 08/25/2022 CLINICAL DATA:  Pain, fall EXAM: CT LUMBAR SPINE WITHOUT CONTRAST TECHNIQUE: Multidetector CT imaging of the lumbar spine was performed without intravenous contrast administration. Multiplanar CT image reconstructions were also generated. RADIATION DOSE REDUCTION: This exam was performed according to the departmental dose-optimization program which includes automated exposure control, adjustment of the mA and/or kV according to patient size and/or use of iterative reconstruction technique. COMPARISON:  Chest x-ray 08/14/2021. CT chest, abdomen and pelvis performed today. FINDINGS: Segmentation: 5 lumbar type vertebrae. Alignment: 5 mm of anterolisthesis of L5 on S1 related to facet disease. Vertebrae: Compression fracture at T11 and partially visualized at T10, stable since prior chest x-ray. No acute fracture. Paraspinal and other soft tissues: Negative. Disc levels: Degenerative disc disease at L4-5 and L5-S1 with disc space narrowing and vacuum disc. Diffuse degenerative facet disease. Other: Fractures through the right sacral ala and right superior pubic ramus. IMPRESSION: Chronic compression fractures at T10 and T11. No acute lumbar spine fracture. Degenerative disc and facet disease in the lower lumbar spine. Right sacral ala fracture and fracture through the superior pubic ramus on the right. See discussion on CT chest, abdomen and pelvis report today. Electronically Signed   By: Charlett Nose M.D.   On: 08/25/2022 20:53   CT CHEST ABDOMEN PELVIS W CONTRAST  Result Date: 08/25/2022 CLINICAL DATA:  Polytrauma, blunt fall, right flank pain. Fall, right flank pain EXAM: CT CHEST, ABDOMEN, AND PELVIS WITH CONTRAST TECHNIQUE: Multidetector CT imaging of the chest, abdomen and pelvis was performed following the standard protocol during bolus administration of intravenous contrast. RADIATION DOSE REDUCTION: This exam was performed according to the  departmental dose-optimization program which includes automated exposure control, adjustment of the mA and/or kV according to patient size and/or use of iterative reconstruction technique. CONTRAST:  65mL OMNIPAQUE IOHEXOL 350 MG/ML SOLN COMPARISON:  None Available. FINDINGS: CT CHEST FINDINGS Cardiovascular: Heart is normal size. Aorta is normal caliber. Coronary artery and aortic atherosclerosis. Mediastinum/Nodes: No mediastinal, hilar, or axillary adenopathy. Trachea and esophagus are unremarkable. Lungs/Pleura: Linear scarring in the lungs bilaterally. Clustered tree-in-bud nodular densities in both lower lobes and right middle lobe compatible with small airways disease/alveolitis. No confluent airspace opacities, effusions or pneumothorax. Musculoskeletal: Chest wall soft tissues are unremarkable. Moderate compression fracture at T10 and mild compression fracture at T11. These are stable when compared to prior chest x-ray from 08/14/2021. CT ABDOMEN PELVIS FINDINGS Hepatobiliary: No hepatic injury or perihepatic hematoma. Gallbladder is unremarkable. Pancreas: No focal abnormality or ductal dilatation. Spleen: No splenic injury or perisplenic hematoma. Adrenals/Urinary Tract: No adrenal hemorrhage or renal injury identified. Bladder is distended. Mild fullness of the left renal collecting system and ureter. No obstructing stones. Stomach/Bowel: Left colonic diverticulosis. No active diverticulitis. Stomach and small bowel decompressed, unremarkable. Vascular/Lymphatic: Diffuse aortoiliac atherosclerosis. No  evidence of aneurysm or adenopathy. Reproductive: Prior hysterectomy.  No adnexal masses. Other: No free fluid or free air. Musculoskeletal: Fractures noted through the right superior and inferior pubic rami. Adjacent stranding compatible with small extraperitoneal hematoma. Fracture through the right side of the sacrum no proximal femoral abnormality. IMPRESSION: Fractures through the right superior and  inferior pubic rami. Mild stranding adjacent to the superior pubic ramus and pubic symphysis compatible with small extraperitoneal hematoma. Right sacral ala fracture. No evidence of solid organ injury. No evidence of significant traumatic injury in the chest. Coronary artery disease, aortic atherosclerosis. Clustered tree-in-bud nodular densities in the lower lobes bilaterally as well as right middle lobe compatible with small airways disease/alveolitis. Areas of scarring throughout the lungs. Distention of the urinary bladder. Mild fullness of the left renal collecting system and ureter without obstructing stone. Left colonic diverticulosis. Electronically Signed   By: Charlett Nose M.D.   On: 08/25/2022 20:50   CT Cervical Spine Wo Contrast  Result Date: 08/25/2022 CLINICAL DATA:  Neck trauma (Age >= 65y).  Pain, fall EXAM: CT CERVICAL SPINE WITHOUT CONTRAST TECHNIQUE: Multidetector CT imaging of the cervical spine was performed without intravenous contrast. Multiplanar CT image reconstructions were also generated. RADIATION DOSE REDUCTION: This exam was performed according to the departmental dose-optimization program which includes automated exposure control, adjustment of the mA and/or kV according to patient size and/or use of iterative reconstruction technique. COMPARISON:  None Available. FINDINGS: Alignment: No subluxation. Skull base and vertebrae: No acute fracture. No primary bone lesion or focal pathologic process. Soft tissues and spinal canal: No prevertebral fluid or swelling. No visible canal hematoma. Disc levels: Diffuse degenerative disc disease and facet disease, moderate to advanced. Upper chest: No acute findings Other: Bilateral nodules in the thyroid the largest 1 cm in the left thyroid lobe. Not clinically significant; no follow-up imaging recommended (ref: J Am Coll Radiol. 2015 Feb;12(2): 143-50). IMPRESSION: Diffuse degenerative disc and facet disease. No acute bony abnormality.  Electronically Signed   By: Charlett Nose M.D.   On: 08/25/2022 20:41   CT HEAD WO CONTRAST ( )  Result Date: 08/25/2022 CLINICAL DATA:  Head trauma, moderate-severe.  Fall. EXAM: CT HEAD WITHOUT CONTRAST TECHNIQUE: Contiguous axial images were obtained from the base of the skull through the vertex without intravenous contrast. RADIATION DOSE REDUCTION: This exam was performed according to the departmental dose-optimization program which includes automated exposure control, adjustment of the mA and/or kV according to patient size and/or use of iterative reconstruction technique. COMPARISON:  08/14/2021 FINDINGS: Brain: There is atrophy and chronic small vessel disease changes. No acute intracranial abnormality. Specifically, no hemorrhage, hydrocephalus, mass lesion, acute infarction, or significant intracranial injury. Vascular: No hyperdense vessel or unexpected calcification. Skull: No acute calvarial abnormality. Sinuses/Orbits: No acute findings. Complete opacification of the right maxillary sinus and scattered right ethmoid air cells is stable. Other: None IMPRESSION: Atrophy, chronic microvascular disease. No acute intracranial abnormality. Electronically Signed   By: Charlett Nose M.D.   On: 08/25/2022 20:40   DG Chest Port 1 View  Result Date: 08/25/2022 CLINICAL DATA:  Fall. EXAM: PORTABLE CHEST 1 VIEW COMPARISON:  08/14/2021, additional priors reviewed FINDINGS: Low lung volumes. Mild cardiomegaly. Stable mediastinal contours. Rounded left paratracheal density has been present intermittently in prior exam and is likely representing vascular overlap. Streaky bilateral perihilar scarring. No pleural effusion or pneumothorax. No acute osseous abnormalities are seen. IMPRESSION: 1. Low lung volumes with mild cardiomegaly. 2. Bilateral perihilar scarring. Electronically Signed   By: Shawna Orleans  Sanford M.D.   On: 08/25/2022 18:25   DG Foot Complete Right  Result Date: 08/25/2022 CLINICAL DATA:  Fall,  unable to move right leg. EXAM: RIGHT FOOT COMPLETE - 3+ VIEW COMPARISON:  None Available. FINDINGS: Technically limited by difficulty with positioning. Hammertoe deformity of the toes. No evidence of acute fracture. Degenerative change of the first and second metatarsal phalangeal joints. No erosive change. IMPRESSION: 1. No acute fracture of the right foot. 2. Hammertoe deformity of the toes. Electronically Signed   By: Narda Rutherford M.D.   On: 08/25/2022 18:24   DG Pelvis 1-2 Views  Result Date: 08/25/2022 CLINICAL DATA:  Fall, unable to move right leg. EXAM: PELVIS - 1-2 VIEW; RIGHT FEMUR 2 VIEWS COMPARISON:  None Available. FINDINGS: Pelvis: Comminuted and displaced fracture of the right pubic body involving the superior and inferior pubic rami. Fracture likely extends to the pubic symphysis. No definite sacroiliac diastasis or visible sacral fracture. No hip dislocation. Femur: No femur fracture. Knee alignment is maintained. No focal bone abnormality. Vascular calcifications are seen. IMPRESSION: 1. Comminuted and displaced right pubic body fracture involving the superior and inferior pubic rami. Fracture likely extends to the pubic symphysis. 2. No fracture of the right hip joint or femur. Electronically Signed   By: Narda Rutherford M.D.   On: 08/25/2022 18:23   DG Femur Min 2 Views Right  Result Date: 08/25/2022 CLINICAL DATA:  Fall, unable to move right leg. EXAM: PELVIS - 1-2 VIEW; RIGHT FEMUR 2 VIEWS COMPARISON:  None Available. FINDINGS: Pelvis: Comminuted and displaced fracture of the right pubic body involving the superior and inferior pubic rami. Fracture likely extends to the pubic symphysis. No definite sacroiliac diastasis or visible sacral fracture. No hip dislocation. Femur: No femur fracture. Knee alignment is maintained. No focal bone abnormality. Vascular calcifications are seen. IMPRESSION: 1. Comminuted and displaced right pubic body fracture involving the superior and inferior  pubic rami. Fracture likely extends to the pubic symphysis. 2. No fracture of the right hip joint or femur. Electronically Signed   By: Narda Rutherford M.D.   On: 08/25/2022 18:23        Scheduled Meds:  diltiazem  60 mg Oral Q12H   metoprolol tartrate  25 mg Oral BID   pantoprazole  40 mg Oral Daily   rosuvastatin  10 mg Oral Once per day on Sunday Wednesday   sertraline  75 mg Oral QHS   sodium chloride flush  3 mL Intravenous Q12H   Continuous Infusions:   LOS: 0 days   Time spent:  Azucena Fallen, DO [facility]  If 7PM-7AM, please contact night-coverage [email or web address]  08/26/2022, 7:49 AM

## 2022-08-26 NOTE — Progress Notes (Signed)
ANTICOAGULATION CONSULT NOTE - Initial Consult  Pharmacy Consult for warfarin Indication: atrial fibrillation  Allergies  Allergen Reactions   Amoxil [Amoxicillin] Diarrhea   Celebrex [Celecoxib] Other (See Comments)    Bruising and stomach pain    Patient Measurements: Height: 4\' 11"  (149.9 cm) Weight: 65.3 kg (144 lb) IBW/kg (Calculated) : 43.2  Vital Signs: Temp: 98.6 F (37 C) (08/07 0438) Temp Source: Oral (08/06 2353) BP: 177/79 (08/07 0438) Pulse Rate: 70 (08/07 0438)  Labs: Recent Labs    08/25/22 1708 08/26/22 0115  HGB 10.6* 10.1*  HCT 34.1* 32.2*  PLT 172 179  LABPROT 23.0* 25.7*  INR 2.0* 2.3*  CREATININE 1.56* 1.54*    Estimated Creatinine Clearance: 23.1 mL/min (A) (by C-G formula based on SCr of 1.54 mg/dL (H)).   Medications:  Scheduled:   diltiazem  60 mg Oral Q12H   metoprolol tartrate  25 mg Oral BID   pantoprazole  40 mg Oral Daily   rosuvastatin  10 mg Oral Once per day on Sunday Wednesday   sertraline  75 mg Oral QHS   sodium chloride flush  3 mL Intravenous Q12H    Assessment: 82 yo female presented for evaluation of hip pain after a fall at home - found to have fractures in superior and inferior pubic rami. Evaluated by ortho who recommends non-operative treatment. On warfarin PTA for atrial fibrillation. PTA warfarin regimen 1mg  MWF and 2mg  all other days. Last dose 8/6. INR on admission 2. Of note, CTAP with small extraperitoneal hematoma adjacent to pubic ramus.   INR today is 2.3 which is therapeutic. Hgb 10.1, plts 179 - stable. Noted DDI b/w warfarin and rosuvastatin (could incr INR), however patient is maintained on this regimen PTA.  Goal of Therapy:  INR 2-3 Monitor platelets by anticoagulation protocol: Yes   Plan:  Warfarin 1 mg PO x1 this evening (continuing home regimen) Daily INR and CBC Monitor PO intake, any new DDI, s/sx bleeding  Rexford Maus, PharmD, BCPS 08/26/2022 8:00 AM

## 2022-08-26 NOTE — Evaluation (Signed)
Occupational Therapy Evaluation Patient Details Name: Brandi [name] MRN: [id number] DOB: [birth year] Today's Date: 08/26/2022   History of Present Illness 82 y.o. female who presented to the ED for evaluation of hip pain after a fall at home.  x-rays showed right sup/inf pubic rami fxs PMH includes HTN, HLD, PAF, CKDIII, anxiety, depression, afib/flutte   Clinical Impression   Pt admitted for above dx, PTA pt reports being Mod I in mobility with Rollator and ind in ADLs. Pt currently presenting with hip and sacral pains and impaired balance. Pt not able to offload BLEs despite therapist facilitation for weight shifts, she is Total A +2 for pivots and needs Max A to Total A for LB ADLs. Pt would benefit from continued acute skilled OT services to address deficits and help transition to next level of care. Patient would benefit from post acute skilled rehab facility with <3 hours of therapy and 24/7 support        If plan is discharge home, recommend the following: Two people to help with walking and/or transfers;A lot of help with bathing/dressing/bathroom;Assist for transportation;Help with stairs or ramp for entrance    Functional Status Assessment  Patient has had a recent decline in their functional status and demonstrates the ability to make significant improvements in function in a reasonable and predictable amount of time.  Equipment Recommendations  None recommended by OT (defer)    Recommendations for Other Services       Precautions / Restrictions Precautions Precautions: Fall Restrictions Weight Bearing Restrictions: Yes RLE Weight Bearing: Weight bearing as tolerated LLE Weight Bearing: Weight bearing as tolerated      Mobility Bed Mobility Overal bed mobility: Needs Assistance Bed Mobility: Supine to Sit     Supine to sit: +2 for physical assistance, Max assist     General bed mobility comments: helicopter method    Transfers Overall transfer level: Needs  assistance Equipment used: 2 person hand held assist Transfers: Sit to/from Stand, Bed to chair/wheelchair/BSC Sit to Stand: +2 physical assistance, Mod assist Stand pivot transfers: +2 physical assistance, Total assist         General transfer comment: Pt unable to take steps due to pain.      Balance Overall balance assessment: Needs assistance Sitting-balance support: Feet supported, Bilateral upper extremity supported Sitting balance-Leahy Scale: Fair     Standing balance support: Bilateral upper extremity supported Standing balance-Leahy Scale: Zero                             ADL either performed or assessed with clinical judgement   ADL Overall ADL's : Needs assistance/impaired Eating/Feeding: Independent;Bed level   Grooming: Sitting;Supervision/safety;Wash/dry face   Upper Body Bathing: Sitting;Independent   Lower Body Bathing: Maximal assistance;Sitting/lateral leans   Upper Body Dressing : Independent;Sitting   Lower Body Dressing: Total assistance;Bed level   Toilet Transfer: Total assistance;+2 for physical assistance;Stand-pivot   Toileting- Clothing Manipulation and Hygiene: Sit to/from stand;Total assistance         General ADL Comments: Pt tolerating EOB sitting well, but does visually display pain in sacrum while sitting. During STS pt not able to offload BLEs while OT/PT provided weight shifts     Vision         Perception         Praxis         Pertinent Vitals/Pain Pain Assessment Pain Assessment: Faces Faces Pain Scale: Hurts even more Pain Location: Hip  Pain Descriptors / Indicators: Grimacing, Discomfort, Moaning Pain Intervention(s): Limited activity within patient's tolerance, Monitored during session, Premedicated before session     Extremity/Trunk Assessment Upper Extremity Assessment Upper Extremity Assessment: Overall WFL for tasks assessed   Lower Extremity Assessment Lower Extremity Assessment: RLE  deficits/detail;LLE deficits/detail RLE Deficits / Details: Pelvic ring fx WBAT LLE Deficits / Details: Pelvic ring fx WBAT   Cervical / Trunk Assessment Cervical / Trunk Assessment: Kyphotic   Communication Communication Communication: Difficulty communicating thoughts/reduced clarity of speech;Difficulty following commands/understanding Following commands: Follows one step commands with increased time Cueing Techniques: Verbal cues;Tactile cues   Cognition Arousal: Alert Behavior During Therapy: Flat affect Overall Cognitive Status: No family/caregiver present to determine baseline cognitive functioning                                 General Comments: Pt was A&Ox4 however would have moments where she would appear to "space out" when asked questions. Pt would have to asked the same question multiple times in order for a response.     General Comments  VSS    Exercises     Shoulder Instructions      Home Living Family/patient expects to be discharged to:: Private residence Living Arrangements: Spouse/significant other;Children Available Help at Discharge: Family;Available 24 hours/day Type of Home: House Home Access: Stairs to enter [employer] of Steps: 5 Entrance Stairs-Rails: Right;Left Home Layout: Multi-level;Able to live on main level with bedroom/bathroom Alternate Level Stairs-Number of Steps: 13 Alternate Level Stairs-Rails: Can reach both Bathroom Shower/Tub: Tub/shower unit   [employer]: Handicapped height Bathroom Accessibility: No   Home Equipment: Rollator (4 wheels);Cane - quad;Grab bars - tub/shower;Hand held Programmer, systems (2 wheels)   Additional Comments: Pt not the greatest historian. Period where pt would not answer question at all, delayed responses needing consistent redirection to question      Prior Functioning/Environment Prior Level of Function : Independent/Modified Independent;History of Falls  (last six months);Patient poor historian/Family not available (Pt reports about 5 falls other than the one for this admission. Pt reports that she would just lose her balance but doesnt specify why.)             Mobility Comments: Mod I rollator ADLs Comments: Ind        OT Problem List: Impaired balance (sitting and/or standing)      OT Treatment/Interventions: Self-care/ADL training;Balance training;Therapeutic exercise;Therapeutic activities;Patient/family education    OT Goals(Current goals can be found in the care plan section) Acute Rehab OT Goals Patient Stated Goal: To get better OT Goal Formulation: With patient Time For Goal Achievement: 09/09/22 Potential to Achieve Goals: Good ADL Goals Pt Will Perform Grooming: standing;with contact guard assist Pt Will Perform Lower Body Bathing: with set-up;with supervision;sitting/lateral leans;with adaptive equipment Pt Will Perform Lower Body Dressing: with set-up;with supervision;sitting/lateral leans;with adaptive equipment Pt Will Transfer to Toilet: with min assist;with +2 assist;bedside commode;stand pivot transfer Pt Will Perform Tub/Shower Transfer: with contact guard assist;tub bench  OT Frequency: Min 1X/week    Co-evaluation   Reason for Co-Treatment: For patient/therapist safety;Necessary to address cognition/behavior during functional activity;Complexity of the patient's impairments (multi-system involvement) PT goals addressed during session: Mobility/safety with mobility OT goals addressed during session: ADL's and self-care      AM-PAC OT "6 Clicks" Daily Activity     Outcome Measure Help from another person eating meals?: None Help from another person taking care of personal grooming?:  A Little Help from another person toileting, which includes using toliet, bedpan, or urinal?: Total Help from another person bathing (including washing, rinsing, drying)?: A Lot Help from another person to put on and taking  off regular upper body clothing?: None Help from another person to put on and taking off regular lower body clothing?: Total 6 Click Score: 15   End of Session Equipment Utilized During Treatment: Gait belt;Rolling walker (2 wheels) Nurse Communication: Need for lift equipment (stedy)  Activity Tolerance: Patient tolerated treatment well Patient left: in chair;with call bell/phone within reach;with chair alarm set  OT Visit Diagnosis: Unsteadiness on feet (R26.81);Other abnormalities of gait and mobility (R26.89)                Time: 4098-1191 OT Time Calculation (min): 35 min Charges:  OT General Charges $OT Visit: 1 Visit OT Evaluation $OT Eval Moderate Complexity: 1 Mod  08/26/2022  AB, OTR/L  Acute Rehabilitation Services  Office: [phone]   Tristan Schroeder 08/26/2022, 1:25 PM

## 2022-08-27 DIAGNOSIS — S72001A Fracture of unspecified part of neck of right femur, initial encounter for closed fracture: Secondary | ICD-10-CM | POA: Diagnosis not present

## 2022-08-27 DIAGNOSIS — S3282XA Multiple fractures of pelvis without disruption of pelvic ring, initial encounter for closed fracture: Secondary | ICD-10-CM | POA: Diagnosis not present

## 2022-08-27 MED ORDER — WARFARIN SODIUM 1 MG PO TABS
1.0000 mg | ORAL_TABLET | Freq: Once | ORAL | Status: AC
  Administered 2022-08-27: 1 mg via ORAL
  Filled 2022-08-27: qty 1

## 2022-08-27 NOTE — [place] FL2 (Signed)
[place] MEDICAID FL2 LEVEL OF CARE FORM     IDENTIFICATION  Patient Name: Brandi [name] Birthdate: 11-14-1940 Sex: female Admission Date (Current Location): 08/25/2022  [facility] and [place] Number:  Producer, television/film/video and Address:  [facility] [place], [address], [place], [place] [address]      Provider Number: [id number]  Attending Physician Name and Address:  Azucena Fallen, MD  Relative Name and Phone Number:  Dorotha, Schiffner   [phone]    Current Level of Care: Hospital Recommended Level of Care: Skilled Nursing Facility Prior Approval Number:    Date Approved/Denied:   PASRR Number:    Discharge Plan: SNF    Current Diagnoses: Patient Active Problem List   Diagnosis Date Noted   Closed traumatic fractures of multiple bones of right hip and pelvis, initial encounter (HCC) 08/25/2022   Chronic kidney disease, stage 3b (HCC) 08/25/2022   Compression fracture of T10 vertebra (HCC) 08/25/2022   Essential hypertension 08/25/2022   Acute kidney injury superimposed on chronic kidney disease (HCC) 08/14/2021   Hypokalemia 08/14/2021   Generalized weakness 08/14/2021   Acute metabolic encephalopathy 08/14/2021   Paroxysmal atrial fibrillation (HCC) 08/14/2021   Normocytic anemia 08/14/2021   Long term (current) use of anticoagulants 08/13/2021   Secondary hypercoagulable state (HCC) 07/17/2021   Atrial flutter (HCC) 07/11/2021   CAP (community acquired pneumonia) 07/07/2021   Acute respiratory failure with hypoxia (HCC) 07/07/2021   AKI (acute kidney injury) (HCC) 07/07/2021   Troponin level elevated 07/07/2021   Vasovagal episode 06/08/2017   Mixed hyperlipidemia 06/08/2017   Chest pain with moderate risk for cardiac etiology 06/07/2017   Abnormal EKG 06/07/2017   Depression 03/18/2012   Mild obesity 03/18/2012    Orientation RESPIRATION BLADDER Height & Weight     Self, Time, Situation, Place  Normal Continent Weight: 144 lb  (65.3 kg) Height:  4\' 11"  (149.9 cm)  BEHAVIORAL SYMPTOMS/MOOD NEUROLOGICAL BOWEL NUTRITION STATUS      Continent Diet (see discharge summary)  AMBULATORY STATUS COMMUNICATION OF NEEDS Skin   Total Care Verbally Normal                       Personal Care Assistance Level of Assistance  Bathing, Feeding, Dressing, Total care Bathing Assistance: Maximum assistance Feeding assistance: Independent Dressing Assistance: Maximum assistance Total Care Assistance: Maximum assistance   Functional Limitations Info  Sight, Hearing, Speech Sight Info: Adequate Hearing Info: Adequate Speech Info: Adequate    SPECIAL CARE FACTORS FREQUENCY  PT (By licensed PT), OT (By licensed OT)     PT Frequency: 5x week OT Frequency: 5x week            Contractures Contractures Info: Not present    Additional Factors Info  Code Status, Allergies Code Status Info: DNR Allergies Info: Amoxil (Amoxicillin), Celebrex (Celecoxib)           Current Medications (08/27/2022):  This is the current hospital active medication list Current Facility-Administered Medications  Medication Dose Route Frequency Provider Last Rate Last Admin   acetaminophen (TYLENOL) tablet 650 mg  650 mg Oral Q6H PRN Charlsie Quest, MD       Or   acetaminophen (TYLENOL) suppository 650 mg  650 mg Rectal Q6H PRN Charlsie Quest, MD       bisacodyl (DULCOLAX) EC tablet 5 mg  5 mg Oral Daily PRN Charlsie Quest, MD       diltiazem (CARDIZEM SR) 12 hr  capsule 60 mg  60 mg Oral Q12H Darreld Mclean R, MD   60 mg at 08/27/22 0934   HYDROcodone-acetaminophen (NORCO/VICODIN) 5-325 MG per tablet 1-2 tablet  1-2 tablet Oral Q4H PRN Charlsie Quest, MD   2 tablet at 08/27/22 0934   HYDROmorphone (DILAUDID) injection 0.5 mg  0.5 mg Intravenous Q3H PRN Darreld Mclean R, MD   0.5 mg at 08/26/22 1400   metoprolol tartrate (LOPRESSOR) tablet 25 mg  25 mg Oral BID Henderly, Britni A, PA-C   25 mg at 08/27/22 0934   ondansetron (ZOFRAN)  tablet 4 mg  4 mg Oral Q6H PRN Charlsie Quest, MD       Or   ondansetron (ZOFRAN) injection 4 mg  4 mg Intravenous Q6H PRN Charlsie Quest, MD       pantoprazole (PROTONIX) EC tablet 40 mg  40 mg Oral Daily Darreld Mclean R, MD   40 mg at 08/27/22 0935   rosuvastatin (CRESTOR) tablet 10 mg  10 mg Oral Once per day on Sunday Wednesday Darreld Mclean R, MD   10 mg at 08/26/22 1626   senna-docusate (Senokot-S) tablet 1 tablet  1 tablet Oral QHS PRN Charlsie Quest, MD       sertraline (ZOLOFT) tablet 75 mg  75 mg Oral QHS Darreld Mclean R, MD   75 mg at 08/26/22 2155   sodium chloride flush (NS) 0.9 % injection 3 mL  3 mL Intravenous Q12H Darreld Mclean R, MD   3 mL at 08/27/22 0935   warfarin (COUMADIN) tablet 1 mg  1 mg Oral ONCE-1600 Rexford Maus, [facility]       Warfarin - Pharmacist Dosing Inpatient   Does not apply q1600 Rexford Maus, [facility]         Discharge Medications: Please see discharge summary for a list of discharge medications.  Relevant Imaging Results:  Relevant Lab Results:   Additional Information SSN: [id number]  Lorri Frederick, LCSW

## 2022-08-27 NOTE — Progress Notes (Signed)
PROGRESS NOTE    Brandi [name]  ZOX:[id number] DOB: Jul 16, 1940 DOA: 08/25/2022 PCP: Gwenyth Bender, MD   Brief Narrative:  Brandi [name] is a 82 y.o. female with medical history significant for paroxysmal A-fib/flutter on Coumadin, CKD stage IIIb, HTN, HLD who presented to the ED for evaluation of hip pain after a fall at home - notably unable to ambulate due to instability and pain.   Assessment & Plan:   Principal Problem:   Closed traumatic fractures of multiple bones of right hip and pelvis, initial encounter [facility]) Active Problems:   Paroxysmal atrial fibrillation (HCC)   Chronic kidney disease, stage 3b (HCC)   Mixed hyperlipidemia   Compression fracture of T10 vertebra (HCC)   Essential hypertension  Right superior and inferior pubic rami fractures Right sacral ala fracture Intractable pain Ambulatory dysfunction - Fractures confirmed on imaging - Ortho recommends non-operative management - weight bearing as tolerated - Dr Jena Gauss outpatient follow-up in 3 weeks -PT/OT eval ongoing -patient remains markedly weak secondary to pain, requiring 2+ assistance still even with analgesics and support with a walker. -Analgesics as needed   Paroxysmal atrial fibrillation/flutter: In sinus rhythm with controlled rate on admission.  On chronic anticoagulation with Coumadin.  INR is 2.0.  CT with small extraperitoneal hematoma adjacent to pubic ramus and symphysis. -Resume coumadin (pharmacy consulted to manage) -Continue metoprolol 25 mg twice daily -Continue diltiazem 60 mg twice daily   CKD stage IIIb: Renal function at baseline.  Continue to monitor.   T10/T11 compression fractures: Noted on CT imaging.  Patient denies focal back pain.  PT/OT and pain control as above.   Hypertension: Continue metoprolol and diltiazem.   Hyperlipidemia: Continue rosuvastatin.   Depression: Continue sertraline.  DVT prophylaxis:  Code Status:   Code Status: DNR Family Communication: None  present  Status is: Inpt  Dispo: The patient is from: Home              Anticipated d/c is to: TBD              Anticipated d/c date is: 24-48h              Patient currently is medically stable for discharge  Consultants:  Ortho  Procedures:  None  Antimicrobials:  None   Subjective: No acute issues/events overnight, pain ongoing today but improved from yesterday.  Denies nausea vomiting diarrhea constipation headache fevers chills or chest pain.  Somewhat anxious about working with therapy again today given previous pain with ambulation.  Objective: Vitals:   08/26/22 0758 08/26/22 1515 08/26/22 2031 08/27/22 0435  BP: (!) 176/78 (!) 160/73 (!) 148/63 (!) 161/65  Pulse: 72 73 66   Resp: 17 16 18 18   Temp: 98.3 F (36.8 C) 98.3 F (36.8 C) 98.2 F (36.8 C) 100 F (37.8 C)  TempSrc:   Oral   SpO2: 97% 94% 99% (!) 64%  Weight:      Height:        Intake/Output Summary (Last 24 hours) at 08/27/2022 0717 Last data filed at 08/27/2022 0602 Gross per 24 hour  Intake 360 ml  Output 1200 ml  Net -840 ml   Filed Weights   08/25/22 1642  Weight: 65.3 kg    Examination:  General:  Pleasantly resting in bed, No acute distress. HEENT:  Normocephalic atraumatic.  Sclerae nonicteric, noninjected.  Extraocular movements intact bilaterally. Neck:  Without mass or deformity.  Trachea is midline. Lungs:  Clear to auscultate bilaterally without rhonchi, wheeze, or  rales. Heart:  Regular rate and rhythm.  Without murmurs, rubs, or gallops. Abdomen:  Soft, nontender, nondistended.  Without guarding or rebound. Extremities: Without cyanosis, clubbing, or obvious deformity.  Range of motion/strength diminished right lower extremity secondary to pain Skin:  Warm and dry, no notable erythema.  Data Reviewed: I have personally reviewed following labs and imaging studies  CBC: Recent Labs  Lab 08/25/22 1708 08/26/22 0115 08/27/22 0201  WBC 10.9* 12.3* 10.2  NEUTROABS 9.1*  --    --   HGB 10.6* 10.1* 10.4*  HCT 34.1* 32.2* 32.3*  MCV 92.9 93.9 95.3  PLT 172 179 171   Basic Metabolic Panel: Recent Labs  Lab 08/25/22 1708 08/26/22 0115  NA 137 135  K 3.8 4.7  CL 94* 96*  CO2 28 26  GLUCOSE 119* 115*  BUN 22 19  CREATININE 1.56* 1.54*  CALCIUM 9.4 8.9   GFR: Estimated Creatinine Clearance: 23.1 mL/min (A) (by C-G formula based on SCr of 1.54 mg/dL (H)). Liver Function Tests: Recent Labs  Lab 08/25/22 1708  AST 28  ALT 20  ALKPHOS 98  BILITOT 0.8  PROT 6.9  ALBUMIN 3.4*   Coagulation Profile: Recent Labs  Lab 08/25/22 1708 08/26/22 0115 08/27/22 0201  INR 2.0* 2.3* 2.6*   Radiology Studies: CT L-SPINE NO CHARGE  Result Date: 08/25/2022 CLINICAL DATA:  Pain, fall EXAM: CT LUMBAR SPINE WITHOUT CONTRAST TECHNIQUE: Multidetector CT imaging of the lumbar spine was performed without intravenous contrast administration. Multiplanar CT image reconstructions were also generated. RADIATION DOSE REDUCTION: This exam was performed according to the departmental dose-optimization program which includes automated exposure control, adjustment of the mA and/or kV according to patient size and/or use of iterative reconstruction technique. COMPARISON:  Chest x-ray 08/14/2021. CT chest, abdomen and pelvis performed today. FINDINGS: Segmentation: 5 lumbar type vertebrae. Alignment: 5 mm of anterolisthesis of L5 on S1 related to facet disease. Vertebrae: Compression fracture at T11 and partially visualized at T10, stable since prior chest x-ray. No acute fracture. Paraspinal and other soft tissues: Negative. Disc levels: Degenerative disc disease at L4-5 and L5-S1 with disc space narrowing and vacuum disc. Diffuse degenerative facet disease. Other: Fractures through the right sacral ala and right superior pubic ramus. IMPRESSION: Chronic compression fractures at T10 and T11. No acute lumbar spine fracture. Degenerative disc and facet disease in the lower lumbar spine. Right  sacral ala fracture and fracture through the superior pubic ramus on the right. See discussion on CT chest, abdomen and pelvis report today. Electronically Signed   By: Charlett Nose M.D.   On: 08/25/2022 20:53   CT CHEST ABDOMEN PELVIS W CONTRAST  Result Date: 08/25/2022 CLINICAL DATA:  Polytrauma, blunt fall, right flank pain. Fall, right flank pain EXAM: CT CHEST, ABDOMEN, AND PELVIS WITH CONTRAST TECHNIQUE: Multidetector CT imaging of the chest, abdomen and pelvis was performed following the standard protocol during bolus administration of intravenous contrast. RADIATION DOSE REDUCTION: This exam was performed according to the departmental dose-optimization program which includes automated exposure control, adjustment of the mA and/or kV according to patient size and/or use of iterative reconstruction technique. CONTRAST:  65mL OMNIPAQUE IOHEXOL 350 MG/ML SOLN COMPARISON:  None Available. FINDINGS: CT CHEST FINDINGS Cardiovascular: Heart is normal size. Aorta is normal caliber. Coronary artery and aortic atherosclerosis. Mediastinum/Nodes: No mediastinal, hilar, or axillary adenopathy. Trachea and esophagus are unremarkable. Lungs/Pleura: Linear scarring in the lungs bilaterally. Clustered tree-in-bud nodular densities in both lower lobes and right middle lobe compatible with small airways disease/alveolitis.  No confluent airspace opacities, effusions or pneumothorax. Musculoskeletal: Chest wall soft tissues are unremarkable. Moderate compression fracture at T10 and mild compression fracture at T11. These are stable when compared to prior chest x-ray from 08/14/2021. CT ABDOMEN PELVIS FINDINGS Hepatobiliary: No hepatic injury or perihepatic hematoma. Gallbladder is unremarkable. Pancreas: No focal abnormality or ductal dilatation. Spleen: No splenic injury or perisplenic hematoma. Adrenals/Urinary Tract: No adrenal hemorrhage or renal injury identified. Bladder is distended. Mild fullness of the left renal  collecting system and ureter. No obstructing stones. Stomach/Bowel: Left colonic diverticulosis. No active diverticulitis. Stomach and small bowel decompressed, unremarkable. Vascular/Lymphatic: Diffuse aortoiliac atherosclerosis. No evidence of aneurysm or adenopathy. Reproductive: Prior hysterectomy.  No adnexal masses. Other: No free fluid or free air. Musculoskeletal: Fractures noted through the right superior and inferior pubic rami. Adjacent stranding compatible with small extraperitoneal hematoma. Fracture through the right side of the sacrum no proximal femoral abnormality. IMPRESSION: Fractures through the right superior and inferior pubic rami. Mild stranding adjacent to the superior pubic ramus and pubic symphysis compatible with small extraperitoneal hematoma. Right sacral ala fracture. No evidence of solid organ injury. No evidence of significant traumatic injury in the chest. Coronary artery disease, aortic atherosclerosis. Clustered tree-in-bud nodular densities in the lower lobes bilaterally as well as right middle lobe compatible with small airways disease/alveolitis. Areas of scarring throughout the lungs. Distention of the urinary bladder. Mild fullness of the left renal collecting system and ureter without obstructing stone. Left colonic diverticulosis. Electronically Signed   By: Charlett Nose M.D.   On: 08/25/2022 20:50   CT Cervical Spine Wo Contrast  Result Date: 08/25/2022 CLINICAL DATA:  Neck trauma (Age >= 65y).  Pain, fall EXAM: CT CERVICAL SPINE WITHOUT CONTRAST TECHNIQUE: Multidetector CT imaging of the cervical spine was performed without intravenous contrast. Multiplanar CT image reconstructions were also generated. RADIATION DOSE REDUCTION: This exam was performed according to the departmental dose-optimization program which includes automated exposure control, adjustment of the mA and/or kV according to patient size and/or use of iterative reconstruction technique. COMPARISON:   None Available. FINDINGS: Alignment: No subluxation. Skull base and vertebrae: No acute fracture. No primary bone lesion or focal pathologic process. Soft tissues and spinal canal: No prevertebral fluid or swelling. No visible canal hematoma. Disc levels: Diffuse degenerative disc disease and facet disease, moderate to advanced. Upper chest: No acute findings Other: Bilateral nodules in the thyroid the largest 1 cm in the left thyroid lobe. Not clinically significant; no follow-up imaging recommended (ref: J Am Coll Radiol. 2015 Feb;12(2): 143-50). IMPRESSION: Diffuse degenerative disc and facet disease. No acute bony abnormality. Electronically Signed   By: Charlett Nose M.D.   On: 08/25/2022 20:41   CT HEAD WO CONTRAST ( )  Result Date: 08/25/2022 CLINICAL DATA:  Head trauma, moderate-severe.  Fall. EXAM: CT HEAD WITHOUT CONTRAST TECHNIQUE: Contiguous axial images were obtained from the base of the skull through the vertex without intravenous contrast. RADIATION DOSE REDUCTION: This exam was performed according to the departmental dose-optimization program which includes automated exposure control, adjustment of the mA and/or kV according to patient size and/or use of iterative reconstruction technique. COMPARISON:  08/14/2021 FINDINGS: Brain: There is atrophy and chronic small vessel disease changes. No acute intracranial abnormality. Specifically, no hemorrhage, hydrocephalus, mass lesion, acute infarction, or significant intracranial injury. Vascular: No hyperdense vessel or unexpected calcification. Skull: No acute calvarial abnormality. Sinuses/Orbits: No acute findings. Complete opacification of the right maxillary sinus and scattered right ethmoid air cells is stable. Other: None IMPRESSION:  Atrophy, chronic microvascular disease. No acute intracranial abnormality. Electronically Signed   By: Charlett Nose M.D.   On: 08/25/2022 20:40   DG Chest Port 1 View  Result Date: 08/25/2022 CLINICAL DATA:   Fall. EXAM: PORTABLE CHEST 1 VIEW COMPARISON:  08/14/2021, additional priors reviewed FINDINGS: Low lung volumes. Mild cardiomegaly. Stable mediastinal contours. Rounded left paratracheal density has been present intermittently in prior exam and is likely representing vascular overlap. Streaky bilateral perihilar scarring. No pleural effusion or pneumothorax. No acute osseous abnormalities are seen. IMPRESSION: 1. Low lung volumes with mild cardiomegaly. 2. Bilateral perihilar scarring. Electronically Signed   By: Narda Rutherford M.D.   On: 08/25/2022 18:25   DG Foot Complete Right  Result Date: 08/25/2022 CLINICAL DATA:  Fall, unable to move right leg. EXAM: RIGHT FOOT COMPLETE - 3+ VIEW COMPARISON:  None Available. FINDINGS: Technically limited by difficulty with positioning. Hammertoe deformity of the toes. No evidence of acute fracture. Degenerative change of the first and second metatarsal phalangeal joints. No erosive change. IMPRESSION: 1. No acute fracture of the right foot. 2. Hammertoe deformity of the toes. Electronically Signed   By: Narda Rutherford M.D.   On: 08/25/2022 18:24   DG Pelvis 1-2 Views  Result Date: 08/25/2022 CLINICAL DATA:  Fall, unable to move right leg. EXAM: PELVIS - 1-2 VIEW; RIGHT FEMUR 2 VIEWS COMPARISON:  None Available. FINDINGS: Pelvis: Comminuted and displaced fracture of the right pubic body involving the superior and inferior pubic rami. Fracture likely extends to the pubic symphysis. No definite sacroiliac diastasis or visible sacral fracture. No hip dislocation. Femur: No femur fracture. Knee alignment is maintained. No focal bone abnormality. Vascular calcifications are seen. IMPRESSION: 1. Comminuted and displaced right pubic body fracture involving the superior and inferior pubic rami. Fracture likely extends to the pubic symphysis. 2. No fracture of the right hip joint or femur. Electronically Signed   By: Narda Rutherford M.D.   On: 08/25/2022 18:23   DG Femur  Min 2 Views Right  Result Date: 08/25/2022 CLINICAL DATA:  Fall, unable to move right leg. EXAM: PELVIS - 1-2 VIEW; RIGHT FEMUR 2 VIEWS COMPARISON:  None Available. FINDINGS: Pelvis: Comminuted and displaced fracture of the right pubic body involving the superior and inferior pubic rami. Fracture likely extends to the pubic symphysis. No definite sacroiliac diastasis or visible sacral fracture. No hip dislocation. Femur: No femur fracture. Knee alignment is maintained. No focal bone abnormality. Vascular calcifications are seen. IMPRESSION: 1. Comminuted and displaced right pubic body fracture involving the superior and inferior pubic rami. Fracture likely extends to the pubic symphysis. 2. No fracture of the right hip joint or femur. Electronically Signed   By: Narda Rutherford M.D.   On: 08/25/2022 18:23        Scheduled Meds:  diltiazem  60 mg Oral Q12H   metoprolol tartrate  25 mg Oral BID   pantoprazole  40 mg Oral Daily   rosuvastatin  10 mg Oral Once per day on Sunday Wednesday   sertraline  75 mg Oral QHS   sodium chloride flush  3 mL Intravenous Q12H   Warfarin - Pharmacist Dosing Inpatient   Does not apply q1600   Continuous Infusions:   LOS: 0 days   Time spent:  Azucena Fallen, DO [facility]  If 7PM-7AM, please contact night-coverage [email or web address]  08/27/2022, 7:17 AM

## 2022-08-27 NOTE — Plan of Care (Signed)

## 2022-08-27 NOTE — Progress Notes (Signed)
RE:  Brandi [name]       Date of Birth:  05/22/1940     Date:  08/27/22        To Whom It May Concern:  Please be advised that the above-named patient will require a short-term nursing home stay - anticipated 30 days or less for rehabilitation and strengthening.  The plan is for return home.                 MD signature                Date

## 2022-08-27 NOTE — Evaluation (Signed)
Physical Therapy Evaluation Patient Details Name: Brandi [name] MRN: [id number] DOB: 1941-01-03 Today's Date: 08/27/2022  History of Present Illness  82 y.o. female who presented to the ED for evaluation of hip pain after a fall at home.  x-rays showed right sup/inf pubic rami fxs PMH includes HTN, HLD, PAF, CKDIII, anxiety, depression, afib/flutte  Clinical Impression  Pt presents with admitting diagnosis above. Co treat with OT. Pt today required +2 total assist to transfer to chair via stand pivot. Recommend SNF upon DC. PT will continue to follow.      If plan is discharge home, recommend the following: Two people to help with walking and/or transfers;A lot of help with bathing/dressing/bathroom;Assistance with cooking/housework;Direct supervision/assist for medications management;Assist for transportation;Help with stairs or ramp for entrance;Supervision due to cognitive status   Can travel by private vehicle   No    Equipment Recommendations Other (comment) (Per accepting facility)  Recommendations for Other Services       Functional Status Assessment Patient has had a recent decline in their functional status and demonstrates the ability to make significant improvements in function in a reasonable and predictable amount of time.     Precautions / Restrictions Precautions Precautions: Fall Restrictions Weight Bearing Restrictions: Yes RLE Weight Bearing: Weight bearing as tolerated LLE Weight Bearing: Weight bearing as tolerated      Mobility  Bed Mobility Overal bed mobility: Needs Assistance Bed Mobility: Supine to Sit     Supine to sit: +2 for physical assistance, Max assist     General bed mobility comments: helicopter method    Transfers Overall transfer level: Needs assistance Equipment used: 2 person hand held assist Transfers: Sit to/from Stand, Bed to chair/wheelchair/BSC Sit to Stand: +2 physical assistance, Mod assist Stand pivot transfers: +2 physical  assistance, Total assist         General transfer comment: Pt unable to take steps due to pain.    Ambulation/Gait               General Gait Details: pt unable  Stairs            Wheelchair Mobility     Tilt Bed    Modified Rankin (Stroke Patients Only)       Balance Overall balance assessment: Needs assistance Sitting-balance support: Feet supported, Bilateral upper extremity supported Sitting balance-Leahy Scale: Fair     Standing balance support: Bilateral upper extremity supported Standing balance-Leahy Scale: Zero Standing balance comment: Total assist to chair                             Pertinent Vitals/Pain Pain Assessment Pain Assessment: Faces Faces Pain Scale: Hurts even more Pain Location: Hip Pain Descriptors / Indicators: Grimacing, Discomfort, Moaning    Home Living Family/patient expects to be discharged to:: Private residence Living Arrangements: Spouse/significant other;Children Available Help at Discharge: Family;Available 24 hours/day Type of Home: House Home Access: Stairs to enter Entrance Stairs-Rails: Right;Left Entrance Stairs-Number of Steps: 5 Alternate Level Stairs-Number of Steps: 13 Home Layout: Multi-level;Able to live on main level with bedroom/bathroom Home Equipment: Rollator (4 wheels);Cane - quad;Grab bars - tub/shower;Hand held Programmer, systems (2 wheels) Additional Comments: Pt not the greatest historian. Period where pt would not answer question at all.    Prior Function Prior Level of Function : Independent/Modified Independent;History of Falls (last six months);Patient poor historian/Family not available (Pt reports about 5 falls other than the one for this admission. Pt  reports that she would just lose her balance but doesnt specify why.)             Mobility Comments: Mod I rollator ADLs Comments: Ind     Extremity/Trunk Assessment        Lower Extremity Assessment RLE  Deficits / Details: Pelvic ring fx WBAT LLE Deficits / Details: Pelvic ring fx WBAT    Cervical / Trunk Assessment Cervical / Trunk Assessment: Kyphotic  Communication   Communication Communication: Difficulty communicating thoughts/reduced clarity of speech;Difficulty following commands/understanding  Cognition Arousal: Alert Behavior During Therapy: Flat affect Overall Cognitive Status: No family/caregiver present to determine baseline cognitive functioning                                 General Comments: Pt was A&Ox4 however would have moments where she would appear to "space out" when asked questions. Pt would have to asked the same question multiple times in order for a response.        General Comments General comments (skin integrity, edema, etc.): VSS    Exercises     Assessment/Plan    PT Assessment Patient needs continued PT services  PT Problem List Decreased range of motion;Decreased strength;Decreased activity tolerance;Decreased balance;Decreased mobility;Decreased coordination;Decreased cognition;Decreased knowledge of use of DME;Decreased safety awareness;Decreased knowledge of precautions;Pain       PT Treatment Interventions DME instruction;Stair training;Gait training;Functional mobility training;Therapeutic activities;Therapeutic exercise;Balance training;Neuromuscular re-education;Cognitive remediation;Patient/family education;Wheelchair mobility training    PT Goals (Current goals can be found in the Care Plan section)  Acute Rehab PT Goals Patient Stated Goal: to get better PT Goal Formulation: With patient Time For Goal Achievement: 09/09/22 Potential to Achieve Goals: Fair    Frequency Min 1X/week     Co-evaluation PT/OT/SLP Co-Evaluation/Treatment: Yes Reason for Co-Treatment: For patient/therapist safety;Necessary to address cognition/behavior during functional activity;Complexity of the patient's impairments (multi-system  involvement) PT goals addressed during session: Mobility/safety with mobility OT goals addressed during session: ADL's and self-care       AM-PAC PT "6 Clicks" Mobility  Outcome Measure Help needed turning from your back to your side while in a flat bed without using bedrails?: A Lot Help needed moving from lying on your back to sitting on the side of a flat bed without using bedrails?: A Lot Help needed moving to and from a bed to a chair (including a wheelchair)?: Total Help needed standing up from a chair using your arms (e.g., wheelchair or bedside chair)?: A Lot Help needed to walk in hospital room?: Total Help needed climbing 3-5 steps with a railing? : Total 6 Click Score: 9    End of Session Equipment Utilized During Treatment: Gait belt Activity Tolerance: Patient tolerated treatment well Patient left: in chair;with call bell/phone within reach;with chair alarm set Nurse Communication: Mobility status;Need for lift equipment PT Visit Diagnosis: Other abnormalities of gait and mobility (R26.89)    Time: 1093-2355 PT Time Calculation (min) (ACUTE ONLY): 36 min   Charges:   PT Evaluation $PT Eval Moderate Complexity: 1 Mod   PT General Charges $$ ACUTE PT VISIT: 1 Visit         Shela Nevin, PT, DPT Acute Rehab Services [phone]   Gladys Damme 08/27/2022, 1:19 PM

## 2022-08-27 NOTE — Progress Notes (Signed)
ANTICOAGULATION CONSULT NOTE - Initial Consult  Pharmacy Consult for warfarin Indication: atrial fibrillation  Allergies  Allergen Reactions   Amoxil [Amoxicillin] Diarrhea   Celebrex [Celecoxib] Other (See Comments)    Bruising and stomach pain    Patient Measurements: Height: 4\' 11"  (149.9 cm) Weight: 65.3 kg (144 lb) IBW/kg (Calculated) : 43.2  Vital Signs: Temp: 100 F (37.8 C) (08/08 0435) Temp Source: Oral (08/07 2031) BP: 161/65 (08/08 0435) Pulse Rate: 66 (08/07 2031)  Labs: Recent Labs    08/25/22 1708 08/26/22 0115 08/27/22 0201  HGB 10.6* 10.1* 10.4*  HCT 34.1* 32.2* 32.3*  PLT 172 179 171  LABPROT 23.0* 25.7* 28.0*  INR 2.0* 2.3* 2.6*  CREATININE 1.56* 1.54*  --     Estimated Creatinine Clearance: 23.1 mL/min (A) (by C-G formula based on SCr of 1.54 mg/dL (H)).   Medications:  Scheduled:   diltiazem  60 mg Oral Q12H   metoprolol tartrate  25 mg Oral BID   pantoprazole  40 mg Oral Daily   rosuvastatin  10 mg Oral Once per day on Sunday Wednesday   sertraline  75 mg Oral QHS   sodium chloride flush  3 mL Intravenous Q12H   Warfarin - Pharmacist Dosing Inpatient   Does not apply q1600    Assessment: 82 yo female presented for evaluation of hip pain after a fall at home - found to have fractures in superior and inferior pubic rami. Evaluated by ortho who recommends non-operative treatment. On warfarin PTA for atrial fibrillation. PTA warfarin regimen 1mg  MWF and 2mg  all other days. Last dose 8/6. INR on admission 2. Of note, CTAP with small extraperitoneal hematoma adjacent to pubic ramus.   INR today is 2.6 which is therapeutic, however INR w/ rapid increase noted over past few days (2 >>2.3 >> 2.6). Noted DDI b/w warfarin and rosuvastatin (could incr INR), however patient is maintained on this regimen PTA. Documented eating ~75% of meals. Hgb 10s and plts 170s - stable  Goal of Therapy:  INR 2-3 Monitor platelets by anticoagulation protocol: Yes    Plan:  Warfarin 1 mg PO x1 this evening (half of home dose) Daily INR and CBC Monitor PO intake, any new DDI, s/sx bleeding  Rexford Maus, PharmD, BCPS 08/27/2022 7:24 AM

## 2022-08-27 NOTE — TOC Initial Note (Addendum)
Transition of Care [facility]) - Initial/Assessment Note    Patient Details  Name: Brandi [name] MRN: [id number] Date of Birth: Jul 01, 1940  Transition of Care [facility]) CM/SW Contact:    Lorri Frederick, LCSW Phone Number: 08/27/2022, 1:53 PM  Clinical Narrative:     CSW met with pt regarding DC recommendation for SNF.  Pt had questions, wants to speak with husband, but acknowledges she needs significant help currently, husband unable to physically assist her, and will likely need SNF.  Medicare choice document provided. Permission given to send out referral in hub, permission given to speak to husband Tasia Catchings and son.  Pt from home with husband and son, no current services.    Referral sent out in hub.  Need to upload additional info to [place] [employer] for passr.             1430: Bed offers provided to pt   Expected Discharge Plan: Skilled Nursing Facility Barriers to Discharge: Continued Medical Work up, SNF Pending bed offer   Patient Goals and CMS Choice Patient states their goals for this hospitalization and ongoing recovery are:: be able to return home CMS Medicare.gov Compare Post Acute Care list provided to:: Patient Choice offered to / list presented to : Patient      Expected Discharge Plan and Services In-house Referral: Clinical Social Work   Post Acute Care Choice: Skilled Nursing Facility Living arrangements for the past 2 months: Single Family Home                                      Prior Living Arrangements/Services Living arrangements for the past 2 months: Single Family Home Lives with:: Spouse, Adult Children (son at home) Patient language and need for interpreter reviewed:: Yes Do you feel safe going back to the place where you live?: Yes      Need for Family Participation in Patient Care: Yes (Comment) Care giver support system in place?: Yes (comment) Current home services: Other (comment) (none) Criminal Activity/Legal Involvement Pertinent to Current  Situation/Hospitalization: No - Comment as needed  Activities of Daily Living Home Assistive Devices/Equipment: None ADL Screening (condition at time of admission) Patient's cognitive ability adequate to safely complete daily activities?: Yes Is the patient deaf or have difficulty hearing?: No Does the patient have difficulty seeing, even when wearing glasses/contacts?: No Does the patient have difficulty concentrating, remembering, or making decisions?: No Patient able to express need for assistance with ADLs?: Yes Does the patient have difficulty dressing or bathing?: No Independently performs ADLs?: Yes (appropriate for developmental age) Does the patient have difficulty walking or climbing stairs?: Yes Weakness of Legs: None Weakness of Arms/Hands: None  Permission Sought/Granted Permission sought to share information with : Family Supports Permission granted to share information with : Yes, Verbal Permission Granted  Share Information with NAME: husband Tasia Catchings, and son  Permission granted to share info w AGENCY: SNF        Emotional Assessment Appearance:: Appears stated age Attitude/Demeanor/Rapport: Engaged Affect (typically observed): Appropriate, Pleasant Orientation: : Oriented to Self, Oriented to Place, Oriented to  Time, Oriented to Situation      Admission diagnosis:  Chronic anticoagulation [Z79.01] Fall, initial encounter [W19.XXXA] Closed fracture of multiple pubic rami, right, initial encounter (HCC) [S32.591A] Closed traumatic fractures of multiple bones of right hip and pelvis, initial encounter (HCC) [S72.001A, S32.82XA] Patient Active Problem List   Diagnosis Date Noted  Closed traumatic fractures of multiple bones of right hip and pelvis, initial encounter (HCC) 08/25/2022   Chronic kidney disease, stage 3b (HCC) 08/25/2022   Compression fracture of T10 vertebra (HCC) 08/25/2022   Essential hypertension 08/25/2022   Acute kidney injury superimposed on  chronic kidney disease (HCC) 08/14/2021   Hypokalemia 08/14/2021   Generalized weakness 08/14/2021   Acute metabolic encephalopathy 08/14/2021   Paroxysmal atrial fibrillation (HCC) 08/14/2021   Normocytic anemia 08/14/2021   Long term (current) use of anticoagulants 08/13/2021   Secondary hypercoagulable state (HCC) 07/17/2021   Atrial flutter (HCC) 07/11/2021   CAP (community acquired pneumonia) 07/07/2021   Acute respiratory failure with hypoxia (HCC) 07/07/2021   AKI (acute kidney injury) (HCC) 07/07/2021   Troponin level elevated 07/07/2021   Vasovagal episode 06/08/2017   Mixed hyperlipidemia 06/08/2017   Chest pain with moderate risk for cardiac etiology 06/07/2017   Abnormal EKG 06/07/2017   Depression 03/18/2012   Mild obesity 03/18/2012   PCP:  Gwenyth Bender, MD Pharmacy:   [facility] [address] [place] - [address] [place] [address] Phone: [phone] Fax: [phone]  [facility] - [place], [place] - [address] [facility] [address] Portal to Registered [facility] [address] [place] [address] Phone: [phone] Fax: [phone]  [facility] - [place], [place] - [address] [place] [address] Phone: [phone] Fax: [phone]     Social Determinants of Health (SDOH) Social History: SDOH Screenings   Housing: Patient Declined (08/27/2022)  Tobacco Use: Low Risk  (08/25/2022)   SDOH Interventions:     Readmission Risk Interventions     No data to display

## 2022-08-27 NOTE — Care Management Obs Status (Signed)
MEDICARE OBSERVATION STATUS NOTIFICATION   Patient Details  Name: Brandi [name] MRN: [id number] Date of Birth: September 08, 1940   Medicare Observation Status Notification Given:  Yes    Lorri Frederick, LCSW 08/27/2022, 10:03 AM

## 2022-08-28 DIAGNOSIS — S3282XA Multiple fractures of pelvis without disruption of pelvic ring, initial encounter for closed fracture: Secondary | ICD-10-CM | POA: Diagnosis not present

## 2022-08-28 DIAGNOSIS — S72001A Fracture of unspecified part of neck of right femur, initial encounter for closed fracture: Secondary | ICD-10-CM | POA: Diagnosis not present

## 2022-08-28 MED ORDER — WARFARIN SODIUM 1 MG PO TABS
1.0000 mg | ORAL_TABLET | Freq: Once | ORAL | Status: AC
  Administered 2022-08-28: 1 mg via ORAL
  Filled 2022-08-28: qty 1

## 2022-08-28 MED ORDER — HYDRALAZINE HCL 20 MG/ML IJ SOLN
5.0000 mg | Freq: Once | INTRAMUSCULAR | Status: AC
  Administered 2022-08-28: 5 mg via INTRAVENOUS
  Filled 2022-08-28: qty 1

## 2022-08-28 MED ORDER — LISINOPRIL 5 MG PO TABS
5.0000 mg | ORAL_TABLET | Freq: Every day | ORAL | Status: DC
  Administered 2022-08-28 – 2022-08-30 (×3): 5 mg via ORAL
  Filled 2022-08-28 (×3): qty 1

## 2022-08-28 NOTE — TOC Progression Note (Addendum)
Transition of Care [facility]) - Progression Note    Patient Details  Name: Brandi [name] MRN: [id number] Date of Birth: 09-Dec-1940  Transition of Care [facility]) CM/SW Contact  Lorri Frederick, LCSW Phone Number: 08/28/2022, 1:15 PM  Clinical Narrative:   CSW spoke with pt regarding SNF choice.  She is requesting response from [facility]. Reached out to [facility].  1300: [facility] does offer bed.  Pt informed, wants to discuss with her husband.   1400: Spoke to pt, she called husband, will accept offer at [facility].  [facility] informed.  She can receive pt over the weekend if pt is ready for DC.  She will be the weekend contact.  Auth request submitted in [place] and approved: [id number], 4 days: 8/10-8/13. Marland Kitchen   PASSR received: [id number]  Expected Discharge Plan: Skilled Nursing Facility Barriers to Discharge: Continued Medical Work up, SNF Pending bed offer  Expected Discharge Plan and Services In-house Referral: Clinical Social Work   Post Acute Care Choice: Skilled Nursing Facility Living arrangements for the past 2 months: Single Family Home                                       Social Determinants of Health (SDOH) Interventions SDOH Screenings   Housing: Patient Declined (08/25/2022)  Tobacco Use: Low Risk  (08/25/2022)    Readmission Risk Interventions     No data to display

## 2022-08-28 NOTE — Progress Notes (Signed)
Occupational Therapy Treatment Patient Details Name: Brandi [name] MRN: [id number] DOB: [birth year] Today's Date: 08/28/2022   History of present illness 82 y.o. female who presented to the ED for evaluation of hip pain after a fall at home.  x-rays showed right sup/inf pubic rami fxs PMH includes HTN, HLD, PAF, CKDIII, anxiety, depression, afib/flutte   OT comments  Attempted to get pt to EOB but pt with strong c/o pain with mobility and not receptive to moving more, likely needs +2 to get to EOB using helicopter method. Pt receptive to LE exercises (see below), she is able to tolerate 80* hip flexion BLE AAROM. Pt continuing to have BLE weakness which makes lifting BLEs a challenge independently but by end of session pt was able to slide LLE across bed to help with bed mobility. OT to continue to progress pt as able, DC plans remain appropriate for SNF.      If plan is discharge home, recommend the following:  Two people to help with walking and/or transfers;A lot of help with bathing/dressing/bathroom;Assist for transportation;Help with stairs or ramp for entrance   Equipment Recommendations  None recommended by OT (defer)    Recommendations for Other Services      Precautions / Restrictions Precautions Precautions: Fall Restrictions Weight Bearing Restrictions: Yes RLE Weight Bearing: Weight bearing as tolerated LLE Weight Bearing: Weight bearing as tolerated       Mobility Bed Mobility               General bed mobility comments: Attempted but pt stopping mobility due to hip pain, best bet is to use helicopter method    Transfers                         Balance                                           ADL either performed or assessed with clinical judgement   ADL Overall ADL's : Needs assistance/impaired                                       General ADL Comments: Attempted to complete mobility to EOB, pt not tolerating  it. Receptive to performing bed level exercises instead. Pt daughter present during session    Extremity/Trunk Assessment              Vision       Perception     Praxis      Cognition Arousal: Alert Behavior During Therapy: WFL for tasks assessed/performed Overall Cognitive Status: Within Functional Limits for tasks assessed                                          Exercises General Exercises - Lower Extremity Ankle Circles/Pumps: AROM, Both, 10 reps, Supine Quad Sets: AROM, Both, 5 reps, Supine Straight Leg Raises: 10 reps, Supine, Both, AAROM (2 sets of 10 reps) Hip Flexion/Marching: AAROM, Both, 5 reps, Supine (2 set of 5 reps)    Shoulder Instructions       General Comments      Pertinent Vitals/ Pain       Pain Assessment Pain  Assessment: 0-10 Pain Score: 8  Faces Pain Scale: Hurts whole lot Pain Location: R hip Pain Descriptors / Indicators: Grimacing, Discomfort, Moaning, Aching Pain Intervention(s): Limited activity within patient's tolerance, Repositioned, Monitored during session  Home Living                                          Prior Functioning/Environment              Frequency  Min 1X/week        Progress Toward Goals  OT Goals(current goals can now be found in the care plan section)  Progress towards OT goals: Progressing toward goals  Acute Rehab OT Goals Patient Stated Goal: To get better OT Goal Formulation: With patient Time For Goal Achievement: 09/09/22 Potential to Achieve Goals: Good  Plan      Co-evaluation                 AM-PAC OT "6 Clicks" Daily Activity     Outcome Measure   Help from another person eating meals?: None Help from another person taking care of personal grooming?: A Little Help from another person toileting, which includes using toliet, bedpan, or urinal?: Total Help from another person bathing (including washing, rinsing, drying)?: A Lot Help  from another person to put on and taking off regular upper body clothing?: None Help from another person to put on and taking off regular lower body clothing?: Total 6 Click Score: 15    End of Session    OT Visit Diagnosis: Unsteadiness on feet (R26.81);Other abnormalities of gait and mobility (R26.89)   Activity Tolerance Patient limited by pain   Patient Left in bed;with call bell/phone within reach;with family/visitor present   Nurse Communication Mobility status        Time: 2130-8657 OT Time Calculation (min): 37 min  Charges: OT General Charges $OT Visit: 1 Visit OT Treatments $Therapeutic Activity: 8-22 mins $Therapeutic Exercise: 8-22 mins  08/28/2022  AB, OTR/L  Acute Rehabilitation Services  Office: [phone]   Tristan Schroeder 08/28/2022, 5:54 PM

## 2022-08-28 NOTE — Progress Notes (Signed)
PROGRESS NOTE    Brandi [name]  ZOX:[id number] DOB: 01/06/1941 DOA: 08/25/2022 PCP: Gwenyth Bender, MD   Brief Narrative:  Brandi [name] is a 82 y.o. female with medical history significant for paroxysmal A-fib/flutter on Coumadin, CKD stage IIIb, HTN, HLD who presented to the ED for evaluation of hip pain after a fall at home - notably unable to ambulate due to instability and pain.   Patient medically stable for discharge, awaiting safe disposition, SNF given ongoing intractable pain with ambulation unsteady gait and limited assistance at home.  Assessment & Plan:   Principal Problem:   Closed traumatic fractures of multiple bones of right hip and pelvis, initial encounter [facility]) Active Problems:   Paroxysmal atrial fibrillation (HCC)   Chronic kidney disease, stage 3b (HCC)   Mixed hyperlipidemia   Compression fracture of T10 vertebra (HCC)   Essential hypertension  Right superior and inferior pubic rami fractures Right sacral ala fracture Intractable pain Ambulatory dysfunction - Fractures confirmed on imaging - Ortho recommends non-operative management - weight bearing as tolerated - Dr Jena Gauss outpatient follow-up in 3 weeks -PT/OT eval ongoing -patient remains markedly weak secondary to pain, requiring 2+ assistance still despite analgesics and support with a walker. -Wean narcotics down aggressively as tolerated.   Paroxysmal atrial fibrillation/flutter: -In sinus rhythm with controlled rate on admission -Continue coumadin (pharmacy consulted to manage) -Continue metoprolol 25 mg twice daily -Continue diltiazem 60 mg twice daily   CKD stage IIIb: Renal function at baseline.  Continue to monitor.   T10/T11 compression fractures: Noted on CT imaging.  Patient denies focal back pain/parasthesias/focal deficits.  PT/OT and pain control as above.   Hypertension, borderline uncontrolled Continue metoprolol and diltiazem. Start lisinopril  Hyperlipidemia: Continue  rosuvastatin.   Depression: Continue sertraline.  DVT prophylaxis:  Code Status:   Code Status: DNR Family Communication: None present  Status is: Inpt  Dispo: The patient is from: Home              Anticipated d/c is to: TBD              Anticipated d/c date is: 24-48h              Patient currently is medically stable for discharge  Consultants:  Ortho  Procedures:  None  Antimicrobials:  None   Subjective: No acute issues/events overnight, pain ongoing but improving daily.  Denies nausea vomiting diarrhea constipation any fevers chills or chest pain  Objective: Vitals:   08/27/22 0435 08/27/22 0826 08/27/22 2023 08/28/22 0446  BP: (!) 161/65 (!) 164/91 (!) 182/84 (!) 143/75  Pulse:   74 73  Resp: 18 17 16 18   Temp: 100 F (37.8 C) 99.8 F (37.7 C) 98.7 F (37.1 C) 98.5 F (36.9 C)  TempSrc:  Oral Oral Oral  SpO2: (!) 64% 93% 100% 99%  Weight:      Height:        Intake/Output Summary (Last 24 hours) at 08/28/2022 0720 Last data filed at 08/27/2022 1600 Gross per 24 hour  Intake 120 ml  Output 250 ml  Net -130 ml   Filed Weights   08/25/22 1642  Weight: 65.3 kg    Examination:  General:  Pleasantly resting in bed, No acute distress. HEENT:  Normocephalic atraumatic.  Sclerae nonicteric, noninjected.  Extraocular movements intact bilaterally. Neck:  Without mass or deformity.  Trachea is midline. Lungs:  Clear to auscultate bilaterally without rhonchi, wheeze, or rales. Heart:  Regular rate and rhythm.  Without  murmurs, rubs, or gallops. Abdomen:  Soft, nontender, nondistended.  Without guarding or rebound. Extremities: Without cyanosis, clubbing, or obvious deformity.  Range of motion/strength diminished right lower extremity secondary to pain Skin:  Warm and dry, no notable erythema.  Data Reviewed: I have personally reviewed following labs and imaging studies  CBC: Recent Labs  Lab 08/25/22 1708 08/26/22 0115 08/27/22 0201 08/28/22 0254   WBC 10.9* 12.3* 10.2 9.8  NEUTROABS 9.1*  --   --   --   HGB 10.6* 10.1* 10.4* 10.2*  HCT 34.1* 32.2* 32.3* 32.5*  MCV 92.9 93.9 95.3 94.2  PLT 172 179 171 186   Basic Metabolic Panel: Recent Labs  Lab 08/25/22 1708 08/26/22 0115  NA 137 135  K 3.8 4.7  CL 94* 96*  CO2 28 26  GLUCOSE 119* 115*  BUN 22 19  CREATININE 1.56* 1.54*  CALCIUM 9.4 8.9   GFR: Estimated Creatinine Clearance: 23.1 mL/min (A) (by C-G formula based on SCr of 1.54 mg/dL (H)).  Liver Function Tests: Recent Labs  Lab 08/25/22 1708  AST 28  ALT 20  ALKPHOS 98  BILITOT 0.8  PROT 6.9  ALBUMIN 3.4*   Coagulation Profile: Recent Labs  Lab 08/25/22 1708 08/26/22 0115 08/27/22 0201 08/28/22 0254  INR 2.0* 2.3* 2.6* 2.5*   Radiology Studies: No results found.  Scheduled Meds:  diltiazem  60 mg Oral Q12H   metoprolol tartrate  25 mg Oral BID   pantoprazole  40 mg Oral Daily   rosuvastatin  10 mg Oral Once per day on Sunday Wednesday   sertraline  75 mg Oral QHS   sodium chloride flush  3 mL Intravenous Q12H   Warfarin - Pharmacist Dosing Inpatient   Does not apply q1600   Continuous Infusions:   LOS: 0 days   Time spent:  Azucena Fallen, DO [facility]  If 7PM-7AM, please contact night-coverage [email or web address]  08/28/2022, 7:20 AM

## 2022-08-28 NOTE — Progress Notes (Signed)
ANTICOAGULATION CONSULT NOTE - Follow-Up Consult  Pharmacy Consult for warfarin Indication: atrial fibrillation  Allergies  Allergen Reactions   Amoxil [Amoxicillin] Diarrhea   Celebrex [Celecoxib] Other (See Comments)    Bruising and stomach pain    Patient Measurements: Height: 4\' 11"  (149.9 cm) Weight: 65.3 kg (144 lb) IBW/kg (Calculated) : 43.2  Vital Signs: Temp: 98.5 F (36.9 C) (08/09 0446) Temp Source: Oral (08/09 0446) BP: 143/75 (08/09 0446) Pulse Rate: 73 (08/09 0446)  Labs: Recent Labs    08/25/22 1708 08/26/22 0115 08/27/22 0201 08/28/22 0254  HGB 10.6* 10.1* 10.4* 10.2*  HCT 34.1* 32.2* 32.3* 32.5*  PLT 172 179 171 186  LABPROT 23.0* 25.7* 28.0* 27.1*  INR 2.0* 2.3* 2.6* 2.5*  CREATININE 1.56* 1.54*  --   --     Estimated Creatinine Clearance: 23.1 mL/min (A) (by C-G formula based on SCr of 1.54 mg/dL (H)).   Medications:  Scheduled:   diltiazem  60 mg Oral Q12H   metoprolol tartrate  25 mg Oral BID   pantoprazole  40 mg Oral Daily   rosuvastatin  10 mg Oral Once per day on Sunday Wednesday   sertraline  75 mg Oral QHS   sodium chloride flush  3 mL Intravenous Q12H   Warfarin - Pharmacist Dosing Inpatient   Does not apply q1600    Assessment: 82 yo female presented for evaluation of hip pain after a fall at home - found to have fractures in superior and inferior pubic rami. Evaluated by ortho who recommends non-operative treatment. On warfarin PTA for atrial fibrillation.  INR on admission 2. Of note, CTAP with small extraperitoneal hematoma adjacent to pubic ramus.   INR 8/9 is 2.5 (therapeutic) s/p reduced dose 8/8 d/t rapid INR increase. Documented eating ~75% of meals. Hgb 10s and plts 186 - stable  PTA warfarin regimen: 1mg  MWF and 2mg  all other days.   Goal of Therapy:  INR 2-3 Monitor platelets by anticoagulation protocol: Yes   Plan:  Warfarin 1 mg PO x1 this evening per PTA regimen  Daily INR and CBC Monitor PO intake, any new  DDI, s/sx bleeding  Jani Gravel, PharmD Clinical Pharmacist  08/28/2022 8:10 AM

## 2022-08-29 DIAGNOSIS — S72001A Fracture of unspecified part of neck of right femur, initial encounter for closed fracture: Secondary | ICD-10-CM | POA: Diagnosis not present

## 2022-08-29 DIAGNOSIS — S3282XA Multiple fractures of pelvis without disruption of pelvic ring, initial encounter for closed fracture: Secondary | ICD-10-CM | POA: Diagnosis not present

## 2022-08-29 NOTE — Plan of Care (Signed)
  Problem: Education: Goal: Knowledge of General Education information will improve Description Including pain rating scale, medication(s)/side effects and non-pharmacologic comfort measures Outcome: Progressing   

## 2022-08-29 NOTE — Progress Notes (Signed)
ANTICOAGULATION CONSULT NOTE - Follow-Up Consult  Pharmacy Consult for warfarin Indication: atrial fibrillation  Allergies  Allergen Reactions   Amoxil [Amoxicillin] Diarrhea   Celebrex [Celecoxib] Other (See Comments)    Bruising and stomach pain    Patient Measurements: Height: 4\' 11"  (149.9 cm) Weight: 65.3 kg (144 lb) IBW/kg (Calculated) : 43.2  Vital Signs: Temp: 98.5 F (36.9 C) (08/10 0804) BP: 193/97 (08/10 0804) Pulse Rate: 98 (08/10 0804)  Labs: Recent Labs    08/27/22 0201 08/28/22 0254 08/29/22 0722  HGB 10.4* 10.2* 11.7*  HCT 32.3* 32.5* 37.3  PLT 171 186 236  LABPROT 28.0* 27.1* 36.1*  INR 2.6* 2.5* 3.6*    Estimated Creatinine Clearance: 23.1 mL/min (A) (by C-G formula based on SCr of 1.54 mg/dL (H)).   Medications:  Scheduled:   diltiazem  60 mg Oral Q12H   lisinopril  5 mg Oral Daily   metoprolol tartrate  25 mg Oral BID   pantoprazole  40 mg Oral Daily   rosuvastatin  10 mg Oral Once per day on Sunday Wednesday   sertraline  75 mg Oral QHS   sodium chloride flush  3 mL Intravenous Q12H   Warfarin - Pharmacist Dosing Inpatient   Does not apply q1600    Assessment: 82 yo female presented for evaluation of hip pain after a fall at home - found to have fractures in superior and inferior pubic rami. Evaluated by ortho who recommends non-operative treatment. On warfarin PTA for atrial fibrillation.  INR on admission 2. Of note, CTAP with small extraperitoneal hematoma adjacent to pubic ramus.   INR 8/10 is 3.6 (SUPRAtherapeutic) s/p reduced dose 8/8 d/t rapid INR increase. Documented eating ~75% of meals. Hgb 11.7 and plts 236 - stable. Patient may need subsequent doses held or lower than PTA regimen based on continued INR increase on PTA regimen.   PTA warfarin regimen: 1mg  MWF and 2mg  all other days.   Goal of Therapy:  INR 2-3 Monitor platelets by anticoagulation protocol: Yes   Plan:  Hold Warfarin 8/10  Daily INR and CBC Monitor PO  intake, any new DDI, s/sx bleeding  Jani Gravel, PharmD Clinical Pharmacist  08/29/2022 8:54 AM

## 2022-08-29 NOTE — Progress Notes (Signed)
Patient BP has been elevated. MD made aware.

## 2022-08-29 NOTE — Progress Notes (Signed)
PROGRESS NOTE    Brandi [name]  JXB:[id number] DOB: 07/17/1940 DOA: 08/25/2022 PCP: Gwenyth Bender, MD   Brief Narrative:  Brandi [name] is a 82 y.o. female with medical history significant for paroxysmal A-fib/flutter on Coumadin, CKD stage IIIb, HTN, HLD who presented to the ED for evaluation of hip pain after a fall at home - notably unable to ambulate due to instability and pain.   Patient medically stable for discharge, awaiting safe disposition, SNF given ongoing intractable pain with ambulation unsteady gait and limited assistance at home.  Assessment & Plan:   Principal Problem:   Closed traumatic fractures of multiple bones of right hip and pelvis, initial encounter [facility]) Active Problems:   Paroxysmal atrial fibrillation (HCC)   Chronic kidney disease, stage 3b (HCC)   Mixed hyperlipidemia   Compression fracture of T10 vertebra (HCC)   Essential hypertension  Right superior and inferior pubic rami fractures Right sacral ala fracture Intractable pain Ambulatory dysfunction - Fractures confirmed on imaging - Ortho recommends non-operative management - weight bearing as tolerated - Dr Jena Gauss outpatient follow-up in 3 weeks -PT/OT eval ongoing -patient remains markedly weak secondary to pain, requiring 2+ assistance still despite analgesics and support with a walker. -Wean narcotics down aggressively as tolerated.   Paroxysmal atrial fibrillation/flutter: -In sinus rhythm with controlled rate on admission -Continue coumadin (pharmacy consulted to manage) -Continue metoprolol 25 mg twice daily -Continue diltiazem 60 mg twice daily   CKD stage IIIb: Renal function at baseline.  Continue to monitor.   T10/T11 compression fractures: Noted on CT imaging.  Patient denies focal back pain/parasthesias/focal deficits.  PT/OT and pain control as above.   Hypertension, borderline uncontrolled Continue metoprolol and diltiazem. Start lisinopril  Hyperlipidemia: Continue  rosuvastatin.   Depression: Continue sertraline.  DVT prophylaxis:  Code Status:   Code Status: DNR Family Communication: None present  Status is: Inpt  Dispo: The patient is from: Home              Anticipated d/c is to: TBD              Anticipated d/c date is: 24-48h              Patient currently is medically stable for discharge  Consultants:  Ortho  Procedures:  None  Antimicrobials:  None   Subjective: No acute issues/events overnight, pain ongoing but improving daily.  Denies nausea vomiting diarrhea constipation any fevers chills or chest pain  Objective: Vitals:   08/28/22 1300 08/28/22 1438 08/28/22 1958 08/29/22 0542  BP: (!) 187/72 (!) 172/74 (!) 169/84 (!) 180/80  Pulse:  84 (!) 105 96  Resp:   19 17  Temp:  98 F (36.7 C) 97.8 F (36.6 C)   TempSrc:  Oral Oral   SpO2:  99% 94% 94%  Weight:      Height:        Intake/Output Summary (Last 24 hours) at 08/29/2022 0756 Last data filed at 08/29/2022 0500 Gross per 24 hour  Intake 120 ml  Output 1050 ml  Net -930 ml   Filed Weights   08/25/22 1642  Weight: 65.3 kg    Examination:  General:  Pleasantly resting in bed, No acute distress. HEENT:  Normocephalic atraumatic.  Sclerae nonicteric, noninjected.  Extraocular movements intact bilaterally. Neck:  Without mass or deformity.  Trachea is midline. Lungs:  Clear to auscultate bilaterally without rhonchi, wheeze, or rales. Heart:  Regular rate and rhythm.  Without murmurs, rubs, or gallops. Abdomen:  Soft, nontender, nondistended.  Without guarding or rebound. Extremities: Without cyanosis, clubbing, or obvious deformity.  Range of motion/strength diminished right lower extremity secondary to pain Skin:  Warm and dry, no notable erythema.  Data Reviewed: I have personally reviewed following labs and imaging studies  CBC: Recent Labs  Lab 08/25/22 1708 08/26/22 0115 08/27/22 0201 08/28/22 0254  WBC 10.9* 12.3* 10.2 9.8  NEUTROABS 9.1*   --   --   --   HGB 10.6* 10.1* 10.4* 10.2*  HCT 34.1* 32.2* 32.3* 32.5*  MCV 92.9 93.9 95.3 94.2  PLT 172 179 171 186   Basic Metabolic Panel: Recent Labs  Lab 08/25/22 1708 08/26/22 0115  NA 137 135  K 3.8 4.7  CL 94* 96*  CO2 28 26  GLUCOSE 119* 115*  BUN 22 19  CREATININE 1.56* 1.54*  CALCIUM 9.4 8.9   GFR: Estimated Creatinine Clearance: 23.1 mL/min (A) (by C-G formula based on SCr of 1.54 mg/dL (H)).  Liver Function Tests: Recent Labs  Lab 08/25/22 1708  AST 28  ALT 20  ALKPHOS 98  BILITOT 0.8  PROT 6.9  ALBUMIN 3.4*   Coagulation Profile: Recent Labs  Lab 08/25/22 1708 08/26/22 0115 08/27/22 0201 08/28/22 0254  INR 2.0* 2.3* 2.6* 2.5*   Radiology Studies: No results found.  Scheduled Meds:  diltiazem  60 mg Oral Q12H   lisinopril  5 mg Oral Daily   metoprolol tartrate  25 mg Oral BID   pantoprazole  40 mg Oral Daily   rosuvastatin  10 mg Oral Once per day on Sunday Wednesday   sertraline  75 mg Oral QHS   sodium chloride flush  3 mL Intravenous Q12H   Warfarin - Pharmacist Dosing Inpatient   Does not apply q1600   Continuous Infusions:   LOS: 0 days   Time spent:  Azucena Fallen, DO [facility]  If 7PM-7AM, please contact night-coverage [email or web address]  08/29/2022, 7:56 AM

## 2022-08-30 DIAGNOSIS — S3282XA Multiple fractures of pelvis without disruption of pelvic ring, initial encounter for closed fracture: Secondary | ICD-10-CM | POA: Diagnosis not present

## 2022-08-30 DIAGNOSIS — S72001A Fracture of unspecified part of neck of right femur, initial encounter for closed fracture: Secondary | ICD-10-CM | POA: Diagnosis not present

## 2022-08-30 LAB — BASIC METABOLIC PANEL
Anion gap: 14 (ref 5–15)
BUN: 28 mg/dL — ABNORMAL HIGH (ref 8–23)
CO2: 24 mmol/L (ref 22–32)
Calcium: 9 mg/dL (ref 8.9–10.3)
Chloride: 97 mmol/L — ABNORMAL LOW (ref 98–111)
Creatinine, Ser: 1.45 mg/dL — ABNORMAL HIGH (ref 0.44–1.00)
GFR, Estimated: 36 mL/min — ABNORMAL LOW (ref >60)
Glucose, Bld: 111 mg/dL — ABNORMAL HIGH (ref 70–99)
Potassium: 3.6 mmol/L (ref 3.5–5.1)
Sodium: 135 mmol/L (ref 135–145)

## 2022-08-30 LAB — PROTIME-INR
INR: 4.3 (ref 0.8–1.2)
Prothrombin Time: 41.5 seconds — ABNORMAL HIGH (ref 11.4–15.2)

## 2022-08-30 LAB — MAGNESIUM: Magnesium: 2.1 mg/dL (ref 1.7–2.4)

## 2022-08-30 MED ORDER — SENNOSIDES-DOCUSATE SODIUM 8.6-50 MG PO TABS: 1.0000 | ORAL_TABLET | Freq: Every evening | ORAL | 0 refills | Status: DC | PRN

## 2022-08-30 MED ORDER — BISACODYL 5 MG PO TBEC: 5.0000 mg | DELAYED_RELEASE_TABLET | Freq: Every day | ORAL | 0 refills | Status: DC | PRN

## 2022-08-30 MED ORDER — LISINOPRIL 5 MG PO TABS: 5.0000 mg | ORAL_TABLET | Freq: Every day | ORAL | 0 refills | Status: DC

## 2022-08-30 MED ORDER — HYDROCODONE-ACETAMINOPHEN 5-325 MG PO TABS: 1.0000 | ORAL_TABLET | ORAL | 0 refills | Status: DC | PRN

## 2022-08-30 NOTE — Discharge Summary (Signed)
Physician Discharge Summary  [name] NWG:[id number] DOB: 08/07/1940 DOA: 08/25/2022  PCP: Gwenyth Bender, MD  Admit date: 08/25/2022 Discharge date: 08/30/2022  Admitted From: Home Disposition:  SNF  Recommendations for Outpatient Follow-up:  Follow up with PCP in 1-2 weeks  Discharge Condition:Stable  CODE STATUS: DNR  Diet recommendation:  As tolerated  Brief/Interim Summary: Brandi [name] is a 82 y.o. female with medical history significant for paroxysmal A-fib/flutter on Coumadin, CKD stage IIIb, HTN, HLD who presented to the ED for evaluation of hip pain after a fall at home - notably unable to ambulate due to instability and pain.    Patient medically stable for discharge, awaiting safe disposition, SNF given ongoing intractable pain with ambulation unsteady gait and limited assistance at home. Patient has bed auth - will DC today.  Discharge Diagnoses:  Principal Problem:   Closed traumatic fractures of multiple bones of right hip and pelvis, initial encounter [facility]) Active Problems:   Paroxysmal atrial fibrillation (HCC)   Chronic kidney disease, stage 3b (HCC)   Mixed hyperlipidemia   Compression fracture of T10 vertebra (HCC)   Essential hypertension   Right superior and inferior pubic rami fractures Right sacral ala fracture Intractable pain Ambulatory dysfunction - Fractures confirmed on imaging - Ortho recommends non-operative management - weight bearing as tolerated - Dr Jena Gauss outpatient follow-up in 3 weeks -PT/OT eval ongoing -patient remains markedly weak secondary to pain, requiring 2+ assistance still despite analgesics and support with a walker. -Wean narcotics down aggressively as tolerated.   Paroxysmal atrial fibrillation/flutter: -In sinus rhythm with controlled rate on admission -Continue coumadin (pharmacy consulted to manage) -Continue metoprolol 25 mg twice daily -Continue diltiazem 60 mg twice daily   CKD stage IIIb: Renal function at baseline.   Continue to monitor.   T10/T11 compression fractures: Noted on CT imaging.  Patient denies focal back pain/parasthesias/focal deficits.  PT/OT and pain control as above.   Hypertension, borderline uncontrolled Continue metoprolol and diltiazem. Start lisinopril   Hyperlipidemia: Continue rosuvastatin.   Depression: Continue sertraline.    Discharge Instructions   Allergies as of 08/30/2022       Reactions   Amoxil [amoxicillin] Diarrhea   Celebrex [celecoxib] Other (See Comments)   Bruising and stomach pain        Medication List     TAKE these medications    acetaminophen 500 MG tablet Commonly known as: TYLENOL Take 1,000 mg by mouth every 6 (six) hours as needed for mild pain.   Aspercreme Lidocaine 4 % Liqd Generic drug: Lidocaine HCl Apply 1 Application topically 2 (two) times daily as needed (knee pain).   azelastine 0.1 % nasal spray Commonly known as: ASTELIN Place 2 sprays into both nostrils daily.   bisacodyl 5 MG EC tablet Commonly known as: DULCOLAX Take 1 tablet (5 mg total) by mouth daily as needed for moderate constipation.   Centrum Silver 50+Women Tabs Take 1 tablet by mouth every evening.   cetirizine 10 MG tablet Commonly known as: ZYRTEC Take 10 mg by mouth daily as needed for allergies.   diltiazem 60 MG 12 hr capsule Commonly known as: CARDIZEM SR Take 60 mg by mouth 2 (two) times daily.   ferrous sulfate 325 (65 FE) MG tablet Take 325 mg by mouth daily with breakfast.   guanFACINE 2 MG tablet Commonly known as: TENEX Take 2 mg by mouth every evening.   HYDROcodone-acetaminophen 5-325 MG tablet Commonly known as: NORCO/VICODIN Take 1-2 tablets by mouth every 4 (four) hours  as needed for moderate pain.   lisinopril 5 MG tablet Commonly known as: ZESTRIL Take 1 tablet (5 mg total) by mouth daily. Start taking on: August 31, 2022   metoprolol tartrate 25 MG tablet Commonly known as: LOPRESSOR Take 1 tablet (25 mg total)  by mouth 2 (two) times daily. What changed: when to take this   OXYGEN Inhale 2 L/min into the lungs continuous.   pantoprazole 40 MG tablet Commonly known as: PROTONIX Take 1 tablet (40 mg total) by mouth daily.   rosuvastatin 10 MG tablet Commonly known as: CRESTOR Take 10 mg by mouth See admin instructions. Take 10 mg by mouth in the evening on Sun/Wed only (twice a week)   senna-docusate 8.6-50 MG tablet Commonly known as: Senokot-S Take 1 tablet by mouth at bedtime as needed for mild constipation.   sertraline 50 MG tablet Commonly known as: ZOLOFT Take 75 mg by mouth at bedtime.   SYSTANE COMPLETE PF OP Place 1 drop into both eyes 3 (three) times daily.   warfarin 2 MG tablet Commonly known as: COUMADIN Take as directed. If you are unsure how to take this medication, talk to your nurse or doctor. Original instructions: Take 1/2 tablet (1mg ) to 1 tablet (2mg ) by mouth daily or as directed by Anticoagulation Clinic. What changed:  how much to take how to take this when to take this additional instructions        Allergies  Allergen Reactions   Amoxil [Amoxicillin] Diarrhea   Celebrex [Celecoxib] Other (See Comments)    Bruising and stomach pain    Consultations: No acute issues/events overnight  Procedures/Studies: CT L-SPINE NO CHARGE  Result Date: 08/25/2022 CLINICAL DATA:  Pain, fall EXAM: CT LUMBAR SPINE WITHOUT CONTRAST TECHNIQUE: Multidetector CT imaging of the lumbar spine was performed without intravenous contrast administration. Multiplanar CT image reconstructions were also generated. RADIATION DOSE REDUCTION: This exam was performed according to the departmental dose-optimization program which includes automated exposure control, adjustment of the mA and/or kV according to patient size and/or use of iterative reconstruction technique. COMPARISON:  Chest x-ray 08/14/2021. CT chest, abdomen and pelvis performed today. FINDINGS: Segmentation: 5 lumbar  type vertebrae. Alignment: 5 mm of anterolisthesis of L5 on S1 related to facet disease. Vertebrae: Compression fracture at T11 and partially visualized at T10, stable since prior chest x-ray. No acute fracture. Paraspinal and other soft tissues: Negative. Disc levels: Degenerative disc disease at L4-5 and L5-S1 with disc space narrowing and vacuum disc. Diffuse degenerative facet disease. Other: Fractures through the right sacral ala and right superior pubic ramus. IMPRESSION: Chronic compression fractures at T10 and T11. No acute lumbar spine fracture. Degenerative disc and facet disease in the lower lumbar spine. Right sacral ala fracture and fracture through the superior pubic ramus on the right. See discussion on CT chest, abdomen and pelvis report today. Electronically Signed   By: Charlett Nose M.D.   On: 08/25/2022 20:53   CT CHEST ABDOMEN PELVIS W CONTRAST  Result Date: 08/25/2022 CLINICAL DATA:  Polytrauma, blunt fall, right flank pain. Fall, right flank pain EXAM: CT CHEST, ABDOMEN, AND PELVIS WITH CONTRAST TECHNIQUE: Multidetector CT imaging of the chest, abdomen and pelvis was performed following the standard protocol during bolus administration of intravenous contrast. RADIATION DOSE REDUCTION: This exam was performed according to the departmental dose-optimization program which includes automated exposure control, adjustment of the mA and/or kV according to patient size and/or use of iterative reconstruction technique. CONTRAST:  65mL OMNIPAQUE IOHEXOL 350 MG/ML SOLN  COMPARISON:  None Available. FINDINGS: CT CHEST FINDINGS Cardiovascular: Heart is normal size. Aorta is normal caliber. Coronary artery and aortic atherosclerosis. Mediastinum/Nodes: No mediastinal, hilar, or axillary adenopathy. Trachea and esophagus are unremarkable. Lungs/Pleura: Linear scarring in the lungs bilaterally. Clustered tree-in-bud nodular densities in both lower lobes and right middle lobe compatible with small airways  disease/alveolitis. No confluent airspace opacities, effusions or pneumothorax. Musculoskeletal: Chest wall soft tissues are unremarkable. Moderate compression fracture at T10 and mild compression fracture at T11. These are stable when compared to prior chest x-ray from 08/14/2021. CT ABDOMEN PELVIS FINDINGS Hepatobiliary: No hepatic injury or perihepatic hematoma. Gallbladder is unremarkable. Pancreas: No focal abnormality or ductal dilatation. Spleen: No splenic injury or perisplenic hematoma. Adrenals/Urinary Tract: No adrenal hemorrhage or renal injury identified. Bladder is distended. Mild fullness of the left renal collecting system and ureter. No obstructing stones. Stomach/Bowel: Left colonic diverticulosis. No active diverticulitis. Stomach and small bowel decompressed, unremarkable. Vascular/Lymphatic: Diffuse aortoiliac atherosclerosis. No evidence of aneurysm or adenopathy. Reproductive: Prior hysterectomy.  No adnexal masses. Other: No free fluid or free air. Musculoskeletal: Fractures noted through the right superior and inferior pubic rami. Adjacent stranding compatible with small extraperitoneal hematoma. Fracture through the right side of the sacrum no proximal femoral abnormality. IMPRESSION: Fractures through the right superior and inferior pubic rami. Mild stranding adjacent to the superior pubic ramus and pubic symphysis compatible with small extraperitoneal hematoma. Right sacral ala fracture. No evidence of solid organ injury. No evidence of significant traumatic injury in the chest. Coronary artery disease, aortic atherosclerosis. Clustered tree-in-bud nodular densities in the lower lobes bilaterally as well as right middle lobe compatible with small airways disease/alveolitis. Areas of scarring throughout the lungs. Distention of the urinary bladder. Mild fullness of the left renal collecting system and ureter without obstructing stone. Left colonic diverticulosis. Electronically Signed    By: Charlett Nose M.D.   On: 08/25/2022 20:50   CT Cervical Spine Wo Contrast  Result Date: 08/25/2022 CLINICAL DATA:  Neck trauma (Age >= 65y).  Pain, fall EXAM: CT CERVICAL SPINE WITHOUT CONTRAST TECHNIQUE: Multidetector CT imaging of the cervical spine was performed without intravenous contrast. Multiplanar CT image reconstructions were also generated. RADIATION DOSE REDUCTION: This exam was performed according to the departmental dose-optimization program which includes automated exposure control, adjustment of the mA and/or kV according to patient size and/or use of iterative reconstruction technique. COMPARISON:  None Available. FINDINGS: Alignment: No subluxation. Skull base and vertebrae: No acute fracture. No primary bone lesion or focal pathologic process. Soft tissues and spinal canal: No prevertebral fluid or swelling. No visible canal hematoma. Disc levels: Diffuse degenerative disc disease and facet disease, moderate to advanced. Upper chest: No acute findings Other: Bilateral nodules in the thyroid the largest 1 cm in the left thyroid lobe. Not clinically significant; no follow-up imaging recommended (ref: J Am Coll Radiol. 2015 Feb;12(2): 143-50). IMPRESSION: Diffuse degenerative disc and facet disease. No acute bony abnormality. Electronically Signed   By: Charlett Nose M.D.   On: 08/25/2022 20:41   CT HEAD WO CONTRAST ( )  Result Date: 08/25/2022 CLINICAL DATA:  Head trauma, moderate-severe.  Fall. EXAM: CT HEAD WITHOUT CONTRAST TECHNIQUE: Contiguous axial images were obtained from the base of the skull through the vertex without intravenous contrast. RADIATION DOSE REDUCTION: This exam was performed according to the departmental dose-optimization program which includes automated exposure control, adjustment of the mA and/or kV according to patient size and/or use of iterative reconstruction technique. COMPARISON:  08/14/2021 FINDINGS: Brain: There  is atrophy and chronic small vessel disease  changes. No acute intracranial abnormality. Specifically, no hemorrhage, hydrocephalus, mass lesion, acute infarction, or significant intracranial injury. Vascular: No hyperdense vessel or unexpected calcification. Skull: No acute calvarial abnormality. Sinuses/Orbits: No acute findings. Complete opacification of the right maxillary sinus and scattered right ethmoid air cells is stable. Other: None IMPRESSION: Atrophy, chronic microvascular disease. No acute intracranial abnormality. Electronically Signed   By: Charlett Nose M.D.   On: 08/25/2022 20:40   DG Chest Port 1 View  Result Date: 08/25/2022 CLINICAL DATA:  Fall. EXAM: PORTABLE CHEST 1 VIEW COMPARISON:  08/14/2021, additional priors reviewed FINDINGS: Low lung volumes. Mild cardiomegaly. Stable mediastinal contours. Rounded left paratracheal density has been present intermittently in prior exam and is likely representing vascular overlap. Streaky bilateral perihilar scarring. No pleural effusion or pneumothorax. No acute osseous abnormalities are seen. IMPRESSION: 1. Low lung volumes with mild cardiomegaly. 2. Bilateral perihilar scarring. Electronically Signed   By: Narda Rutherford M.D.   On: 08/25/2022 18:25   DG Foot Complete Right  Result Date: 08/25/2022 CLINICAL DATA:  Fall, unable to move right leg. EXAM: RIGHT FOOT COMPLETE - 3+ VIEW COMPARISON:  None Available. FINDINGS: Technically limited by difficulty with positioning. Hammertoe deformity of the toes. No evidence of acute fracture. Degenerative change of the first and second metatarsal phalangeal joints. No erosive change. IMPRESSION: 1. No acute fracture of the right foot. 2. Hammertoe deformity of the toes. Electronically Signed   By: Narda Rutherford M.D.   On: 08/25/2022 18:24   DG Pelvis 1-2 Views  Result Date: 08/25/2022 CLINICAL DATA:  Fall, unable to move right leg. EXAM: PELVIS - 1-2 VIEW; RIGHT FEMUR 2 VIEWS COMPARISON:  None Available. FINDINGS: Pelvis: Comminuted and  displaced fracture of the right pubic body involving the superior and inferior pubic rami. Fracture likely extends to the pubic symphysis. No definite sacroiliac diastasis or visible sacral fracture. No hip dislocation. Femur: No femur fracture. Knee alignment is maintained. No focal bone abnormality. Vascular calcifications are seen. IMPRESSION: 1. Comminuted and displaced right pubic body fracture involving the superior and inferior pubic rami. Fracture likely extends to the pubic symphysis. 2. No fracture of the right hip joint or femur. Electronically Signed   By: Narda Rutherford M.D.   On: 08/25/2022 18:23   DG Femur Min 2 Views Right  Result Date: 08/25/2022 CLINICAL DATA:  Fall, unable to move right leg. EXAM: PELVIS - 1-2 VIEW; RIGHT FEMUR 2 VIEWS COMPARISON:  None Available. FINDINGS: Pelvis: Comminuted and displaced fracture of the right pubic body involving the superior and inferior pubic rami. Fracture likely extends to the pubic symphysis. No definite sacroiliac diastasis or visible sacral fracture. No hip dislocation. Femur: No femur fracture. Knee alignment is maintained. No focal bone abnormality. Vascular calcifications are seen. IMPRESSION: 1. Comminuted and displaced right pubic body fracture involving the superior and inferior pubic rami. Fracture likely extends to the pubic symphysis. 2. No fracture of the right hip joint or femur. Electronically Signed   By: Narda Rutherford M.D.   On: 08/25/2022 18:23     Subjective: Orthopedics   Discharge Exam: Vitals:   08/30/22 0433 08/30/22 0713  BP: (!) 151/80 (!) 166/73  Pulse: 87 95  Resp: 17 15  Temp: 97.8 F (36.6 C) (!) 97.4 F (36.3 C)  SpO2: 96% 95%   Vitals:   08/29/22 1635 08/29/22 1950 08/30/22 0433 08/30/22 0713  BP: (!) 130/115 (!) 172/92 (!) 151/80 (!) 166/73  Pulse: 80  80 87 95  Resp: 20 15 17 15   Temp: 97.6 F (36.4 C) (!) 97.5 F (36.4 C) 97.8 F (36.6 C) (!) 97.4 F (36.3 C)  TempSrc: Oral Oral  Oral   SpO2: 95% 95% 96% 95%  Weight:      Height:        General: Pt is alert, awake, not in acute distress Cardiovascular: RRR, S1/S2 +, no rubs, no gallops Respiratory: CTA bilaterally, no wheezing, no rhonchi Abdominal: Soft, NT, ND, bowel sounds + Extremities: no edema, no cyanosis    The results of significant diagnostics from this hospitalization (including imaging, microbiology, ancillary and laboratory) are listed below for reference.     Microbiology: No results found for this or any previous visit (from the past 240 hour(s)).   Labs: BNP (last 3 results) No results for input(s): "BNP" in the last 8760 hours. Basic Metabolic Panel: Recent Labs  Lab 08/25/22 1708 08/26/22 0115  NA 137 135  K 3.8 4.7  CL 94* 96*  CO2 28 26  GLUCOSE 119* 115*  BUN 22 19  CREATININE 1.56* 1.54*  CALCIUM 9.4 8.9   Liver Function Tests: Recent Labs  Lab 08/25/22 1708  AST 28  ALT 20  ALKPHOS 98  BILITOT 0.8  PROT 6.9  ALBUMIN 3.4*   No results for input(s): "LIPASE", "AMYLASE" in the last 168 hours. No results for input(s): "AMMONIA" in the last 168 hours. CBC: Recent Labs  Lab 08/25/22 1708 08/26/22 0115 08/27/22 0201 08/28/22 0254 08/29/22 0722  WBC 10.9* 12.3* 10.2 9.8 10.2  NEUTROABS 9.1*  --   --   --   --   HGB 10.6* 10.1* 10.4* 10.2* 11.7*  HCT 34.1* 32.2* 32.3* 32.5* 37.3  MCV 92.9 93.9 95.3 94.2 91.9  PLT 172 179 171 186 236   Cardiac Enzymes: No results for input(s): "CKTOTAL", "CKMB", "CKMBINDEX", "TROPONINI" in the last 168 hours. BNP: Invalid input(s): "POCBNP" CBG: No results for input(s): "GLUCAP" in the last 168 hours. D-Dimer No results for input(s): "DDIMER" in the last 72 hours. Hgb A1c No results for input(s): "HGBA1C" in the last 72 hours. Lipid Profile No results for input(s): "CHOL", "HDL", "LDLCALC", "TRIG", "CHOLHDL", "LDLDIRECT" in the last 72 hours. Thyroid function studies No results for input(s): "TSH", "T4TOTAL", "T3FREE",  "THYROIDAB" in the last 72 hours.  Invalid input(s): "FREET3" Anemia work up No results for input(s): "VITAMINB12", "FOLATE", "FERRITIN", "TIBC", "IRON", "RETICCTPCT" in the last 72 hours. Urinalysis    Component Value Date/Time   COLORURINE STRAW (A) 08/14/2021 1330   APPEARANCEUR CLEAR 08/14/2021 1330   LABSPEC 1.004 (L) 08/14/2021 1330   PHURINE 6.0 08/14/2021 1330   GLUCOSEU NEGATIVE 08/14/2021 1330   HGBUR SMALL (A) 08/14/2021 1330   BILIRUBINUR NEGATIVE 08/14/2021 1330   KETONESUR NEGATIVE 08/14/2021 1330   PROTEINUR 100 (A) 08/14/2021 1330   NITRITE NEGATIVE 08/14/2021 1330   LEUKOCYTESUR MODERATE (A) 08/14/2021 1330   Sepsis Labs Recent Labs  Lab 08/26/22 0115 08/27/22 0201 08/28/22 0254 08/29/22 0722  WBC 12.3* 10.2 9.8 10.2   Microbiology No results found for this or any previous visit (from the past 240 hour(s)).   Time coordinating discharge: Over 30 minutes  SIGNED:   Azucena Fallen, DO [facility] 08/30/2022, 10:39 AM Pager   If 7PM-7AM, please contact night-coverage [email or web address]

## 2022-08-30 NOTE — TOC Transition Note (Signed)
Transition of Care [facility]) - CM/SW Discharge Note   Patient Details  Name: Brandi [name] MRN: [id number] Date of Birth: January 14, 1941  Transition of Care [facility]) CM/SW Contact:  Donnalee Curry, LCSWA Phone Number: 08/30/2022, 10:50 AM   Clinical Narrative:     Per MD pt ready for d/c.  SW spoke with [place] [facility] [phone]) able to accept.  Bed: 604 A Call Report: [phone]  [facility] called  SW updated pt's husband Tasia Catchings [phone])    Final next level of care: Skilled Nursing Facility Barriers to Discharge: Barriers Resolved   Patient Goals and CMS Choice CMS Medicare.gov Compare Post Acute Care list provided to:: Patient Choice offered to / list presented to : Patient  Discharge Placement                Patient chooses bed at: [facility] Patient to be transferred to facility by: [facility] Name of family member notified: Tasia Catchings Patient and family notified of of transfer: 08/30/22  Discharge Plan and Services Additional resources added to the After Visit Summary for   In-house Referral: Clinical Social Work   Post Acute Care Choice: Skilled Nursing Facility                               Social Determinants of Health (SDOH) Interventions SDOH Screenings   Housing: Patient Declined (08/25/2022)  Tobacco Use: Low Risk  (08/25/2022)     Readmission Risk Interventions     No data to display

## 2022-08-30 NOTE — Plan of Care (Signed)

## 2022-09-02 ENCOUNTER — Ambulatory Visit: Payer: Medicare Other

## 2022-09-03 DIAGNOSIS — R262 Difficulty in walking, not elsewhere classified: Secondary | ICD-10-CM

## 2022-09-03 DIAGNOSIS — I480 Paroxysmal atrial fibrillation: Secondary | ICD-10-CM

## 2022-09-03 DIAGNOSIS — S329XXD Fracture of unspecified parts of lumbosacral spine and pelvis, subsequent encounter for fracture with routine healing: Secondary | ICD-10-CM

## 2022-09-03 DIAGNOSIS — I10 Essential (primary) hypertension: Secondary | ICD-10-CM

## 2022-09-03 DIAGNOSIS — N1832 Chronic kidney disease, stage 3b: Secondary | ICD-10-CM

## 2022-09-23 DIAGNOSIS — R41841 Cognitive communication deficit: Secondary | ICD-10-CM

## 2022-09-23 DIAGNOSIS — R2689 Other abnormalities of gait and mobility: Secondary | ICD-10-CM

## 2022-09-23 DIAGNOSIS — N1832 Chronic kidney disease, stage 3b: Secondary | ICD-10-CM | POA: Diagnosis not present

## 2022-09-23 DIAGNOSIS — M6281 Muscle weakness (generalized): Secondary | ICD-10-CM

## 2022-09-23 DIAGNOSIS — F32A Depression, unspecified: Secondary | ICD-10-CM

## 2022-09-23 DIAGNOSIS — I480 Paroxysmal atrial fibrillation: Secondary | ICD-10-CM | POA: Diagnosis not present

## 2022-09-23 DIAGNOSIS — R1311 Dysphagia, oral phase: Secondary | ICD-10-CM | POA: Diagnosis not present

## 2022-09-23 DIAGNOSIS — I10 Essential (primary) hypertension: Secondary | ICD-10-CM | POA: Diagnosis not present

## 2022-09-23 DIAGNOSIS — Z7689 Persons encountering health services in other specified circumstances: Secondary | ICD-10-CM

## 2022-09-28 DIAGNOSIS — M6281 Muscle weakness (generalized): Secondary | ICD-10-CM

## 2022-09-28 DIAGNOSIS — F32A Depression, unspecified: Secondary | ICD-10-CM

## 2022-09-28 DIAGNOSIS — R2689 Other abnormalities of gait and mobility: Secondary | ICD-10-CM

## 2022-09-28 DIAGNOSIS — N1832 Chronic kidney disease, stage 3b: Secondary | ICD-10-CM | POA: Diagnosis not present

## 2022-09-28 DIAGNOSIS — R1311 Dysphagia, oral phase: Secondary | ICD-10-CM | POA: Diagnosis not present

## 2022-09-28 DIAGNOSIS — I10 Essential (primary) hypertension: Secondary | ICD-10-CM | POA: Diagnosis not present

## 2022-09-28 DIAGNOSIS — Z7689 Persons encountering health services in other specified circumstances: Secondary | ICD-10-CM

## 2022-09-28 DIAGNOSIS — I480 Paroxysmal atrial fibrillation: Secondary | ICD-10-CM | POA: Diagnosis not present

## 2022-10-06 DIAGNOSIS — I10 Essential (primary) hypertension: Secondary | ICD-10-CM | POA: Diagnosis not present

## 2022-10-06 DIAGNOSIS — M6281 Muscle weakness (generalized): Secondary | ICD-10-CM | POA: Diagnosis not present

## 2022-10-06 DIAGNOSIS — I480 Paroxysmal atrial fibrillation: Secondary | ICD-10-CM | POA: Diagnosis not present

## 2022-10-06 DIAGNOSIS — R2689 Other abnormalities of gait and mobility: Secondary | ICD-10-CM | POA: Diagnosis not present

## 2022-10-23 DIAGNOSIS — I480 Paroxysmal atrial fibrillation: Secondary | ICD-10-CM

## 2022-10-23 DIAGNOSIS — N1832 Chronic kidney disease, stage 3b: Secondary | ICD-10-CM | POA: Diagnosis not present

## 2022-10-23 DIAGNOSIS — R262 Difficulty in walking, not elsewhere classified: Secondary | ICD-10-CM | POA: Diagnosis not present

## 2022-10-23 DIAGNOSIS — I10 Essential (primary) hypertension: Secondary | ICD-10-CM | POA: Diagnosis not present

## 2022-10-23 DIAGNOSIS — M6281 Muscle weakness (generalized): Secondary | ICD-10-CM | POA: Diagnosis not present

## 2022-10-28 ENCOUNTER — Telehealth: Payer: Self-pay

## 2022-10-28 NOTE — Telephone Encounter (Signed)
A nurse is calling to ask some questions in regards to the patients INR. Please call back to discuss. [phone] Florentina Addison from [facility]

## 2022-10-28 NOTE — Telephone Encounter (Signed)
I spoke to Brandi [name] from [facility] and she will start checking INR on 10/16 during home visit.

## 2022-10-29 ENCOUNTER — Telehealth: Payer: Self-pay | Admitting: Cardiovascular Disease

## 2022-10-29 NOTE — Telephone Encounter (Signed)
Tresa Endo from [facility] is requesting for verbal orders on home therapy for the patient. Tresa Endo stated it would be 2 times a week for 4 weeks and then 1 time a week for 4 weeks. Tresa Endo stated her line is secured and it would be okay for [place] to leave verbal orders on VM. Please advise.

## 2022-11-02 NOTE — Telephone Encounter (Signed)
Called and left a message for [place] at [facility] with the following from Dr. Mariah Milling.   Will defer to primary care to arrange home therapy/PT given it is noncardiac issue Thx TGollan

## 2022-11-04 ENCOUNTER — Ambulatory Visit (INDEPENDENT_AMBULATORY_CARE_PROVIDER_SITE_OTHER): Payer: Medicare Other | Admitting: Internal Medicine

## 2022-11-04 DIAGNOSIS — Z7901 Long term (current) use of anticoagulants: Secondary | ICD-10-CM | POA: Diagnosis not present

## 2022-11-04 LAB — POCT INR: INR: 1.3 — AB (ref 2.0–3.0)

## 2022-11-10 ENCOUNTER — Emergency Department (HOSPITAL_COMMUNITY): Payer: Medicare Other

## 2022-11-10 ENCOUNTER — Encounter (HOSPITAL_COMMUNITY): Payer: Self-pay

## 2022-11-10 ENCOUNTER — Inpatient Hospital Stay (HOSPITAL_COMMUNITY): Payer: Medicare Other

## 2022-11-10 ENCOUNTER — Inpatient Hospital Stay (HOSPITAL_COMMUNITY)
Admission: EM | Admit: 2022-11-10 | Discharge: 2022-11-16 | DRG: 481 | Disposition: A | Discharge status: Skilled Nursing Facility | Payer: Medicare Other | Attending: Internal Medicine | Admitting: Internal Medicine

## 2022-11-10 ENCOUNTER — Other Ambulatory Visit: Payer: Self-pay

## 2022-11-10 DIAGNOSIS — S72002A Fracture of unspecified part of neck of left femur, initial encounter for closed fracture: Principal | ICD-10-CM

## 2022-11-10 DIAGNOSIS — Z880 Allergy status to penicillin: Secondary | ICD-10-CM | POA: Diagnosis not present

## 2022-11-10 DIAGNOSIS — R338 Other retention of urine: Secondary | ICD-10-CM | POA: Diagnosis present

## 2022-11-10 DIAGNOSIS — Z79899 Other long term (current) drug therapy: Secondary | ICD-10-CM

## 2022-11-10 DIAGNOSIS — N179 Acute kidney failure, unspecified: Secondary | ICD-10-CM | POA: Diagnosis present

## 2022-11-10 DIAGNOSIS — Z9981 Dependence on supplemental oxygen: Secondary | ICD-10-CM

## 2022-11-10 DIAGNOSIS — Z886 Allergy status to analgesic agent status: Secondary | ICD-10-CM

## 2022-11-10 DIAGNOSIS — I480 Paroxysmal atrial fibrillation: Secondary | ICD-10-CM | POA: Diagnosis present

## 2022-11-10 DIAGNOSIS — Z7901 Long term (current) use of anticoagulants: Secondary | ICD-10-CM | POA: Diagnosis not present

## 2022-11-10 DIAGNOSIS — D62 Acute posthemorrhagic anemia: Secondary | ICD-10-CM | POA: Diagnosis not present

## 2022-11-10 DIAGNOSIS — Y92008 Other place in unspecified non-institutional (private) residence as the place of occurrence of the external cause: Secondary | ICD-10-CM

## 2022-11-10 DIAGNOSIS — R296 Repeated falls: Secondary | ICD-10-CM | POA: Diagnosis present

## 2022-11-10 DIAGNOSIS — Z8249 Family history of ischemic heart disease and other diseases of the circulatory system: Secondary | ICD-10-CM | POA: Diagnosis not present

## 2022-11-10 DIAGNOSIS — F32A Depression, unspecified: Secondary | ICD-10-CM | POA: Diagnosis present

## 2022-11-10 DIAGNOSIS — W1830XA Fall on same level, unspecified, initial encounter: Secondary | ICD-10-CM | POA: Diagnosis present

## 2022-11-10 DIAGNOSIS — I4892 Unspecified atrial flutter: Secondary | ICD-10-CM | POA: Diagnosis present

## 2022-11-10 DIAGNOSIS — N189 Chronic kidney disease, unspecified: Secondary | ICD-10-CM | POA: Diagnosis not present

## 2022-11-10 DIAGNOSIS — N1832 Chronic kidney disease, stage 3b: Secondary | ICD-10-CM | POA: Diagnosis present

## 2022-11-10 DIAGNOSIS — E782 Mixed hyperlipidemia: Secondary | ICD-10-CM | POA: Diagnosis present

## 2022-11-10 DIAGNOSIS — R8271 Bacteriuria: Secondary | ICD-10-CM

## 2022-11-10 DIAGNOSIS — R791 Abnormal coagulation profile: Secondary | ICD-10-CM | POA: Diagnosis present

## 2022-11-10 DIAGNOSIS — N3001 Acute cystitis with hematuria: Secondary | ICD-10-CM

## 2022-11-10 DIAGNOSIS — S72142A Displaced intertrochanteric fracture of left femur, initial encounter for closed fracture: Principal | ICD-10-CM | POA: Diagnosis present

## 2022-11-10 DIAGNOSIS — F0393 Unspecified dementia, unspecified severity, with mood disturbance: Secondary | ICD-10-CM | POA: Diagnosis present

## 2022-11-10 DIAGNOSIS — I129 Hypertensive chronic kidney disease with stage 1 through stage 4 chronic kidney disease, or unspecified chronic kidney disease: Secondary | ICD-10-CM | POA: Diagnosis present

## 2022-11-10 DIAGNOSIS — Z66 Do not resuscitate: Secondary | ICD-10-CM | POA: Diagnosis present

## 2022-11-10 DIAGNOSIS — I872 Venous insufficiency (chronic) (peripheral): Secondary | ICD-10-CM | POA: Diagnosis present

## 2022-11-10 DIAGNOSIS — Z7189 Other specified counseling: Secondary | ICD-10-CM | POA: Diagnosis not present

## 2022-11-10 DIAGNOSIS — Z515 Encounter for palliative care: Secondary | ICD-10-CM | POA: Diagnosis not present

## 2022-11-10 HISTORY — DX: Unspecified atrial fibrillation: I48.91

## 2022-11-10 HISTORY — DX: Cardiac arrhythmia, unspecified: I49.9

## 2022-11-10 LAB — URINALYSIS, W/ REFLEX TO CULTURE (INFECTION SUSPECTED)
Bilirubin Urine: NEGATIVE
Glucose, UA: NEGATIVE mg/dL
Hgb urine dipstick: NEGATIVE
Ketones, ur: NEGATIVE mg/dL
Nitrite: POSITIVE — AB
Protein, ur: 100 mg/dL — AB
Specific Gravity, Urine: 1.011 (ref 1.005–1.030)
pH: 5 (ref 5.0–8.0)

## 2022-11-10 LAB — COMPREHENSIVE METABOLIC PANEL
ALT: 19 U/L (ref 0–44)
AST: 22 U/L (ref 15–41)
Albumin: 3 g/dL — ABNORMAL LOW (ref 3.5–5.0)
Alkaline Phosphatase: 83 U/L (ref 38–126)
Anion gap: 9 (ref 5–15)
BUN: 20 mg/dL (ref 8–23)
CO2: 23 mmol/L (ref 22–32)
Calcium: 8.7 mg/dL — ABNORMAL LOW (ref 8.9–10.3)
Chloride: 108 mmol/L (ref 98–111)
Creatinine, Ser: 1.34 mg/dL — ABNORMAL HIGH (ref 0.44–1.00)
GFR, Estimated: 40 mL/min — ABNORMAL LOW (ref >60)
Glucose, Bld: 131 mg/dL — ABNORMAL HIGH (ref 70–99)
Potassium: 3.9 mmol/L (ref 3.5–5.1)
Sodium: 140 mmol/L (ref 135–145)
Total Bilirubin: 0.6 mg/dL (ref 0.3–1.2)
Total Protein: 6.1 g/dL — ABNORMAL LOW (ref 6.5–8.1)

## 2022-11-10 LAB — CBC WITH DIFFERENTIAL/PLATELET
Abs Immature Granulocytes: 0.09 10*3/uL — ABNORMAL HIGH (ref 0.00–0.07)
Basophils Absolute: 0 10*3/uL (ref 0.0–0.1)
Basophils Relative: 0 %
Eosinophils Absolute: 0 10*3/uL (ref 0.0–0.5)
Eosinophils Relative: 0 %
HCT: 28.3 % — ABNORMAL LOW (ref 36.0–46.0)
Hemoglobin: 8.6 g/dL — ABNORMAL LOW (ref 12.0–15.0)
Immature Granulocytes: 1 %
Lymphocytes Relative: 6 %
Lymphs Abs: 0.8 10*3/uL (ref 0.7–4.0)
MCH: 28.2 pg (ref 26.0–34.0)
MCHC: 30.4 g/dL (ref 30.0–36.0)
MCV: 92.8 fL (ref 80.0–100.0)
Monocytes Absolute: 0.8 10*3/uL (ref 0.1–1.0)
Monocytes Relative: 6 %
Neutro Abs: 12 10*3/uL — ABNORMAL HIGH (ref 1.7–7.7)
Neutrophils Relative %: 87 %
Platelets: 201 10*3/uL (ref 150–400)
RBC: 3.05 MIL/uL — ABNORMAL LOW (ref 3.87–5.11)
RDW: 13.9 % (ref 11.5–15.5)
WBC: 13.8 10*3/uL — ABNORMAL HIGH (ref 4.0–10.5)
nRBC: 0 % (ref 0.0–0.2)

## 2022-11-10 LAB — PROTIME-INR
INR: 1.9 — ABNORMAL HIGH (ref 0.8–1.2)
Prothrombin Time: 21.8 s — ABNORMAL HIGH (ref 11.4–15.2)

## 2022-11-10 MED ORDER — DILTIAZEM HCL ER 60 MG PO CP12
60.0000 mg | ORAL_CAPSULE | Freq: Two times a day (BID) | ORAL | Status: DC
  Administered 2022-11-10 – 2022-11-16 (×10): 60 mg via ORAL
  Filled 2022-11-10 (×12): qty 1

## 2022-11-10 MED ORDER — ACETAMINOPHEN 650 MG RE SUPP: 650.0000 mg | Freq: Four times a day (QID) | RECTAL | Status: DC | PRN

## 2022-11-10 MED ORDER — LISINOPRIL 5 MG PO TABS
5.0000 mg | ORAL_TABLET | Freq: Every day | ORAL | Status: DC
  Administered 2022-11-12: 5 mg via ORAL
  Filled 2022-11-10 (×2): qty 1

## 2022-11-10 MED ORDER — SERTRALINE HCL 50 MG PO TABS
75.0000 mg | ORAL_TABLET | Freq: Every day | ORAL | Status: DC
  Administered 2022-11-10 – 2022-11-15 (×4): 75 mg via ORAL
  Filled 2022-11-10: qty 2
  Filled 2022-11-10 (×4): qty 1

## 2022-11-10 MED ORDER — SODIUM CHLORIDE 0.9 % IV SOLN
1.0000 g | Freq: Once | INTRAVENOUS | Status: AC
  Administered 2022-11-10: 1 g via INTRAVENOUS
  Filled 2022-11-10: qty 10

## 2022-11-10 MED ORDER — PANTOPRAZOLE SODIUM 40 MG PO TBEC
40.0000 mg | DELAYED_RELEASE_TABLET | Freq: Every day | ORAL | Status: DC
  Administered 2022-11-12 – 2022-11-16 (×5): 40 mg via ORAL
  Filled 2022-11-10 (×6): qty 1

## 2022-11-10 MED ORDER — SENNOSIDES-DOCUSATE SODIUM 8.6-50 MG PO TABS: 1.0000 | ORAL_TABLET | Freq: Every evening | ORAL | Status: DC | PRN

## 2022-11-10 MED ORDER — ROSUVASTATIN CALCIUM 10 MG PO TABS
10.0000 mg | ORAL_TABLET | ORAL | Status: DC
  Administered 2022-11-11 – 2022-11-15 (×2): 10 mg via ORAL
  Filled 2022-11-10 (×3): qty 1

## 2022-11-10 MED ORDER — GUANFACINE HCL 1 MG PO TABS
2.0000 mg | ORAL_TABLET | Freq: Every evening | ORAL | Status: DC
  Administered 2022-11-10 – 2022-11-15 (×5): 2 mg via ORAL
  Filled 2022-11-10 (×8): qty 2

## 2022-11-10 MED ORDER — METOPROLOL TARTRATE 25 MG PO TABS
25.0000 mg | ORAL_TABLET | Freq: Two times a day (BID) | ORAL | Status: DC
  Administered 2022-11-10 – 2022-11-12 (×3): 25 mg via ORAL
  Filled 2022-11-10 (×4): qty 1

## 2022-11-10 MED ORDER — ACETAMINOPHEN 325 MG PO TABS
650.0000 mg | ORAL_TABLET | Freq: Four times a day (QID) | ORAL | Status: DC | PRN
  Administered 2022-11-12 – 2022-11-16 (×5): 650 mg via ORAL
  Filled 2022-11-10 (×5): qty 2

## 2022-11-10 NOTE — H&P (View-Only) (Signed)
Patient ID: Brandi [name] MRN: [id number] DOB/AGE: 82/06/1940 82 y.o.  Admit date: 11/10/2022  Admission Diagnoses:  Principal Problem:   Closed intertrochanteric fracture of left femur, initial encounter (HCC)   HPI: Ortho consult for left IT hip fx sustained 11/09/22.  PMH notable for 82 yo female with paroxysmal afib/flutter on coumadin, CKD IIIb, HTN, and HLD.  She has been seen by Dr. Truitt Merle outpatient for non op management of pelvic ring fx sustained Aug 2024.  Denies numbness/tingling.   Husband not at bedside. Patient herself has known memory issues.   Past Medical History: Past Medical History:  Diagnosis Date   Allergy    Hyperlipidemia    Hypertension    Neuromuscular disorder (HCC)    Venous insufficiency     Surgical History: Past Surgical History:  Procedure Laterality Date   ANKLE SURGERY Left    KNEE SURGERY Left    VARICOSE VEIN SURGERY      Family History: Family History  Problem Relation Age of Onset   Heart attack Father     Social History: Social History   Socioeconomic History   Marital status: Married    Spouse name: Not on file   Number of children: Not on file   Years of education: Not on file   Highest education level: Not on file  Occupational History   Not on file  Tobacco Use   Smoking status: Never   Smokeless tobacco: Never   Tobacco comments:    Never smoke 07/17/21  Vaping Use   Vaping status: Never Used  Substance and Sexual Activity   Alcohol use: Never   Drug use: Never   Sexual activity: Not on file  Other Topics Concern   Not on file  Social History Narrative   Not on file   Social Determinants of Health   Financial Resource Strain: Not on file  Food Insecurity: Not on file  Transportation Needs: Not on file  Physical Activity: Not on file  Stress: Not on file  Social Connections: Not on file  Intimate Partner Violence: Not on file    Allergies: Amoxil [amoxicillin] and Celebrex  [celecoxib]  Medications: I have reviewed the patient's current medications.  Vital Signs: Patient Vitals for the past 24 hrs:  BP Temp Temp src Pulse Resp SpO2 Height Weight  11/10/22 2253 (!) 146/70 -- -- 67 -- -- -- --  11/10/22 2221 (!) 142/60 98 F (36.7 C) Oral 64 15 100 % -- --  11/10/22 1900 (!) 158/66 -- -- -- (!) 22 -- -- --  11/10/22 1830 (!) 151/80 -- -- 69 19 100 % -- --  11/10/22 1815 -- -- -- 68 (!) 21 100 % -- --  11/10/22 1803 (!) 159/86 -- -- 80 (!) 24 95 % -- --  11/10/22 1630 -- -- -- 74 16 100 % -- --  11/10/22 1551 (!) 176/88 97.6 F (36.4 C) Oral 74 17 99 % -- --  11/10/22 1549 -- -- -- -- -- -- 4\' 11"  (1.499 m) 65.3 kg  11/10/22 1546 -- -- -- -- -- 97 % -- --    Radiology: DG Hip Unilat With Pelvis 2-3 Views Left  Result Date: 11/10/2022 CLINICAL DATA:  Recent fall with hip pain, initial encounter EXAM: DG HIP (WITH OR WITHOUT PELVIS) 3V LEFT COMPARISON:  08/25/2022 FINDINGS: Intratrochanteric left femoral fracture is noted with impaction and angulation at the fracture site. The femoral head is well seated. Pelvic ring is intact. Old  healed superior and inferior pubic rami fractures on the right are noted. IMPRESSION: Left intratrochanteric femoral fracture. 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 Electronically Signed   By: Alcide Clever M.D.   On: 11/10/2022 19:49   CT Head Wo Contrast  Result Date: 11/10/2022 CLINICAL DATA:  Ataxia, head trauma. Polytrauma, blunt. Additional history obtained: Lethargy. Fatigue. EXAM: CT HEAD WITHOUT CONTRAST CT CERVICAL SPINE WITHOUT CONTRAST TECHNIQUE: Multidetector CT imaging of the head and cervical spine was performed following the standard protocol without intravenous contrast. Multiplanar CT image reconstructions of the cervical spine were also generated. RADIATION DOSE REDUCTION: This exam was performed according to the departmental dose-optimization program which includes automated exposure control, adjustment  of the mA and/or kV according to patient size and/or use of iterative reconstruction technique. COMPARISON:  Head CT 08/25/2022.  Cervical spine CT 08/25/2022. FINDINGS: CT HEAD FINDINGS Brain: Prominence of the ventricles and sulci which appears commensurate with the degree of cerebral volume loss. Patchy and ill-defined hypoattenuation within the cerebral white matter, nonspecific but compatible with moderate chronic small vessel ischemic disease. There is no acute intracranial hemorrhage. No demarcated cortical infarct. No extra-axial fluid collection. No evidence of an intracranial mass. No midline shift. Vascular: No hyperdense vessel.  Atherosclerotic calcifications Skull: No calvarial fracture or aggressive osseous lesion. Sinuses/Orbits: No mass or acute finding within the imaged orbits. Complete opacification of the right maxillary sinus at the imaged levels (with superimposed foci mineralization within the sinus). Partial opacification of anterior right ethmoid air cells, overall mild-to-moderate in severity. CT CERVICAL SPINE FINDINGS Alignment: Nonspecific reversal of the expected cervical lordosis. 3 mm C3-C4 grade 1 anterolisthesis. 2 mm C4-C5 grade 1 anterolisthesis. Slight C5-C6 grade 1 retrolisthesis. 2-3 mm C7-T1 grade 1 anterolisthesis. Skull base and vertebrae: The basion-dental and atlanto-dental intervals are maintained.No evidence of acute fracture to the cervical spine. Facet ankylosis on the right at C2-C3. Multilevel ventral osteophytes. Soft tissues and spinal canal: No prevertebral fluid or swelling. No visible canal hematoma. Disc levels: Cervical spondylosis with multilevel disc space narrowing, disc bulges/central disc protrusions, posterior disc osteophyte complexes, endplate spurring, uncovertebral hypertrophy and facet arthrosis. Disc space narrowing is greatest at C5-C6 and C6-C7 (advanced at these levels). Multilevel spinal canal narrowing. Most notably, a posterior disc  osteophyte complex contributes to spinal canal stenosis at C5-C6 which is suspected moderate in severity. Multilevel bony neural foraminal narrowing. Degenerative changes also present at the C1-C2 articulation. Upper chest: No consolidation within the imaged lung apices. No visible pneumothorax. Multiple thyroid nodules measuring up to 12 mm, not meeting consensus criteria for ultrasound follow-up based on size. No follow-up imaging recommended. Reference: J Am Coll Radiol. 2015 Feb;12(2): 143-50. IMPRESSION: CT head: 1.  No evidence of an acute intracranial abnormality. 2. Parenchymal atrophy and chronic small vessel ischemic disease. 3. Paranasal sinus disease at the imaged levels, as described. CT cervical spine: 1. No evidence of an acute cervical spine fracture. 2. Nonspecific reversal of the expected cervical lordosis. 3. Grade 1 spondylolisthesis at C3-C4, C4-C5, C5-C6 and C7-T1. 4. Cervical spondylosis as described. 5. Facet ankylosis on the right at C2-C3. Electronically Signed   By: Jackey Loge D.O.   On: 11/10/2022 19:48   CT Cervical Spine Wo Contrast  Result Date: 11/10/2022 CLINICAL DATA:  Ataxia, head trauma. Polytrauma, blunt. Additional history obtained: Lethargy. Fatigue. EXAM: CT HEAD WITHOUT CONTRAST CT CERVICAL SPINE WITHOUT CONTRAST TECHNIQUE: Multidetector CT imaging of the head and cervical spine was performed following the standard protocol without intravenous contrast.  Multiplanar CT image reconstructions of the cervical spine were also generated. RADIATION DOSE REDUCTION: This exam was performed according to the departmental dose-optimization program which includes automated exposure control, adjustment of the mA and/or kV according to patient size and/or use of iterative reconstruction technique. COMPARISON:  Head CT 08/25/2022.  Cervical spine CT 08/25/2022. FINDINGS: CT HEAD FINDINGS Brain: Prominence of the ventricles and sulci which appears commensurate with the degree of  cerebral volume loss. Patchy and ill-defined hypoattenuation within the cerebral white matter, nonspecific but compatible with moderate chronic small vessel ischemic disease. There is no acute intracranial hemorrhage. No demarcated cortical infarct. No extra-axial fluid collection. No evidence of an intracranial mass. No midline shift. Vascular: No hyperdense vessel.  Atherosclerotic calcifications Skull: No calvarial fracture or aggressive osseous lesion. Sinuses/Orbits: No mass or acute finding within the imaged orbits. Complete opacification of the right maxillary sinus at the imaged levels (with superimposed foci mineralization within the sinus). Partial opacification of anterior right ethmoid air cells, overall mild-to-moderate in severity. CT CERVICAL SPINE FINDINGS Alignment: Nonspecific reversal of the expected cervical lordosis. 3 mm C3-C4 grade 1 anterolisthesis. 2 mm C4-C5 grade 1 anterolisthesis. Slight C5-C6 grade 1 retrolisthesis. 2-3 mm C7-T1 grade 1 anterolisthesis. Skull base and vertebrae: The basion-dental and atlanto-dental intervals are maintained.No evidence of acute fracture to the cervical spine. Facet ankylosis on the right at C2-C3. Multilevel ventral osteophytes. Soft tissues and spinal canal: No prevertebral fluid or swelling. No visible canal hematoma. Disc levels: Cervical spondylosis with multilevel disc space narrowing, disc bulges/central disc protrusions, posterior disc osteophyte complexes, endplate spurring, uncovertebral hypertrophy and facet arthrosis. Disc space narrowing is greatest at C5-C6 and C6-C7 (advanced at these levels). Multilevel spinal canal narrowing. Most notably, a posterior disc osteophyte complex contributes to spinal canal stenosis at C5-C6 which is suspected moderate in severity. Multilevel bony neural foraminal narrowing. Degenerative changes also present at the C1-C2 articulation. Upper chest: No consolidation within the imaged lung apices. No visible  pneumothorax. Multiple thyroid nodules measuring up to 12 mm, not meeting consensus criteria for ultrasound follow-up based on size. No follow-up imaging recommended. Reference: J Am Coll Radiol. 2015 Feb;12(2): 143-50. IMPRESSION: CT head: 1.  No evidence of an acute intracranial abnormality. 2. Parenchymal atrophy and chronic small vessel ischemic disease. 3. Paranasal sinus disease at the imaged levels, as described. CT cervical spine: 1. No evidence of an acute cervical spine fracture. 2. Nonspecific reversal of the expected cervical lordosis. 3. Grade 1 spondylolisthesis at C3-C4, C4-C5, C5-C6 and C7-T1. 4. Cervical spondylosis as described. 5. Facet ankylosis on the right at C2-C3. Electronically Signed   By: Jackey Loge D.O.   On: 11/10/2022 19:48   DG Chest 1 View  Result Date: 11/10/2022 CLINICAL DATA:  Weakness EXAM: PORTABLE CHEST 1 VIEW COMPARISON:  08/25/2022 FINDINGS: Cardiac shadow is stable. Tortuous thoracic aorta is noted. The lungs are well aerated bilaterally. Stable scarring is noted in the midportion of both lungs. No new focal infiltrate is seen. IMPRESSION: No acute abnormality noted. Electronically Signed   By: Alcide Clever M.D.   On: 11/10/2022 19:48    Labs: Recent Labs    11/10/22 1629  WBC 13.8*  RBC 3.05*  HCT 28.3*  PLT 201   Recent Labs    11/10/22 1629  NA 140  K 3.9  CL 108  CO2 23  BUN 20  CREATININE 1.34*  GLUCOSE 131*  CALCIUM 8.7*   Recent Labs    11/10/22 1629  INR 1.9*  Review of Systems: ROS as detailed in HPI  Physical Exam: Body mass index is 29.08 kg/m.  Physical Exam   Gen: Pleasant, NAD Comfortable at rest  Left Lower Extremity: Skin intact LLE shortened, ER+ TTP over proximal femur ADF/APF/EHL 5/5 SILT throughout DP, PT 2+ to palp CR < 2s   Assessment and Plan: Ortho consult for left comminuted IT hip fx in 82 yo female with PMH paroxysmal afib/flutter on coumadin, CKD IIIb, HTN, and HLD who presented after a  fall 11/09/22    -history, exam and imaging reviewed at length -admitted to Medicine team, appreciate assistance -she will benefit from surgery in the form of left femur intramedullary nail fixation -please keep NPO from MN and hold VTE ppx for possible surgery 11/11/22 -monitor H&H periop, please keep blood on hold for OR -monitor INR periop, please bring down to <1.4 preop if safe to do so to minimize postop anemia -PT/OT postop  Netta Cedars, MD Orthopaedic Surgeon [facility] [phone]  Risks of surgery include hardware failure/irritation, new/persistent/recurrent infection, stiffness, nerve/vessel/tendon injury, nonunion/malunion of any fracture, wound healing issues, allograft usage, development of arthritis, failure of this surgery, possibility of external fixation in certain situations, possibility of delayed definitive surgery, need for further surgery, prolonged wound care including further soft tissue coverage procedures, thromboembolic events, anesthesia/medical complications/events perioperatively and beyond, amputation, death among others are relevant in this clinical situation.

## 2022-11-10 NOTE — ED Notes (Signed)
Patient transported to X-ray 

## 2022-11-10 NOTE — Consult Note (Addendum)
Patient ID: Brandi [name] MRN: [id number] DOB/AGE: 82/06/1940 82 y.o.  Admit date: 11/10/2022  Admission Diagnoses:  Principal Problem:   Closed intertrochanteric fracture of left femur, initial encounter (HCC)   HPI: Ortho consult for left IT hip fx sustained 11/09/22.  PMH notable for 82 yo female with paroxysmal afib/flutter on coumadin, CKD IIIb, HTN, and HLD.  She has been seen by Dr. Truitt Merle outpatient for non op management of pelvic ring fx sustained Aug 2024.  Denies numbness/tingling.   Husband not at bedside. Patient herself has known memory issues.   Past Medical History: Past Medical History:  Diagnosis Date   Allergy    Hyperlipidemia    Hypertension    Neuromuscular disorder (HCC)    Venous insufficiency     Surgical History: Past Surgical History:  Procedure Laterality Date   ANKLE SURGERY Left    KNEE SURGERY Left    VARICOSE VEIN SURGERY      Family History: Family History  Problem Relation Age of Onset   Heart attack Father     Social History: Social History   Socioeconomic History   Marital status: Married    Spouse name: Not on file   Number of children: Not on file   Years of education: Not on file   Highest education level: Not on file  Occupational History   Not on file  Tobacco Use   Smoking status: Never   Smokeless tobacco: Never   Tobacco comments:    Never smoke 07/17/21  Vaping Use   Vaping status: Never Used  Substance and Sexual Activity   Alcohol use: Never   Drug use: Never   Sexual activity: Not on file  Other Topics Concern   Not on file  Social History Narrative   Not on file   Social Determinants of Health   Financial Resource Strain: Not on file  Food Insecurity: Not on file  Transportation Needs: Not on file  Physical Activity: Not on file  Stress: Not on file  Social Connections: Not on file  Intimate Partner Violence: Not on file    Allergies: Amoxil [amoxicillin] and Celebrex  [celecoxib]  Medications: I have reviewed the patient's current medications.  Vital Signs: Patient Vitals for the past 24 hrs:  BP Temp Temp src Pulse Resp SpO2 Height Weight  11/10/22 2253 (!) 146/70 -- -- 67 -- -- -- --  11/10/22 2221 (!) 142/60 98 F (36.7 C) Oral 64 15 100 % -- --  11/10/22 1900 (!) 158/66 -- -- -- (!) 22 -- -- --  11/10/22 1830 (!) 151/80 -- -- 69 19 100 % -- --  11/10/22 1815 -- -- -- 68 (!) 21 100 % -- --  11/10/22 1803 (!) 159/86 -- -- 80 (!) 24 95 % -- --  11/10/22 1630 -- -- -- 74 16 100 % -- --  11/10/22 1551 (!) 176/88 97.6 F (36.4 C) Oral 74 17 99 % -- --  11/10/22 1549 -- -- -- -- -- -- 4\' 11"  (1.499 m) 65.3 kg  11/10/22 1546 -- -- -- -- -- 97 % -- --    Radiology: DG Hip Unilat With Pelvis 2-3 Views Left  Result Date: 11/10/2022 CLINICAL DATA:  Recent fall with hip pain, initial encounter EXAM: DG HIP (WITH OR WITHOUT PELVIS) 3V LEFT COMPARISON:  08/25/2022 FINDINGS: Intratrochanteric left femoral fracture is noted with impaction and angulation at the fracture site. The femoral head is well seated. Pelvic ring is intact. Old  healed superior and inferior pubic rami fractures on the right are noted. IMPRESSION: Left intratrochanteric femoral fracture. 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 Electronically Signed   By: Alcide Clever M.D.   On: 11/10/2022 19:49   CT Head Wo Contrast  Result Date: 11/10/2022 CLINICAL DATA:  Ataxia, head trauma. Polytrauma, blunt. Additional history obtained: Lethargy. Fatigue. EXAM: CT HEAD WITHOUT CONTRAST CT CERVICAL SPINE WITHOUT CONTRAST TECHNIQUE: Multidetector CT imaging of the head and cervical spine was performed following the standard protocol without intravenous contrast. Multiplanar CT image reconstructions of the cervical spine were also generated. RADIATION DOSE REDUCTION: This exam was performed according to the departmental dose-optimization program which includes automated exposure control, adjustment  of the mA and/or kV according to patient size and/or use of iterative reconstruction technique. COMPARISON:  Head CT 08/25/2022.  Cervical spine CT 08/25/2022. FINDINGS: CT HEAD FINDINGS Brain: Prominence of the ventricles and sulci which appears commensurate with the degree of cerebral volume loss. Patchy and ill-defined hypoattenuation within the cerebral white matter, nonspecific but compatible with moderate chronic small vessel ischemic disease. There is no acute intracranial hemorrhage. No demarcated cortical infarct. No extra-axial fluid collection. No evidence of an intracranial mass. No midline shift. Vascular: No hyperdense vessel.  Atherosclerotic calcifications Skull: No calvarial fracture or aggressive osseous lesion. Sinuses/Orbits: No mass or acute finding within the imaged orbits. Complete opacification of the right maxillary sinus at the imaged levels (with superimposed foci mineralization within the sinus). Partial opacification of anterior right ethmoid air cells, overall mild-to-moderate in severity. CT CERVICAL SPINE FINDINGS Alignment: Nonspecific reversal of the expected cervical lordosis. 3 mm C3-C4 grade 1 anterolisthesis. 2 mm C4-C5 grade 1 anterolisthesis. Slight C5-C6 grade 1 retrolisthesis. 2-3 mm C7-T1 grade 1 anterolisthesis. Skull base and vertebrae: The basion-dental and atlanto-dental intervals are maintained.No evidence of acute fracture to the cervical spine. Facet ankylosis on the right at C2-C3. Multilevel ventral osteophytes. Soft tissues and spinal canal: No prevertebral fluid or swelling. No visible canal hematoma. Disc levels: Cervical spondylosis with multilevel disc space narrowing, disc bulges/central disc protrusions, posterior disc osteophyte complexes, endplate spurring, uncovertebral hypertrophy and facet arthrosis. Disc space narrowing is greatest at C5-C6 and C6-C7 (advanced at these levels). Multilevel spinal canal narrowing. Most notably, a posterior disc  osteophyte complex contributes to spinal canal stenosis at C5-C6 which is suspected moderate in severity. Multilevel bony neural foraminal narrowing. Degenerative changes also present at the C1-C2 articulation. Upper chest: No consolidation within the imaged lung apices. No visible pneumothorax. Multiple thyroid nodules measuring up to 12 mm, not meeting consensus criteria for ultrasound follow-up based on size. No follow-up imaging recommended. Reference: J Am Coll Radiol. 2015 Feb;12(2): 143-50. IMPRESSION: CT head: 1.  No evidence of an acute intracranial abnormality. 2. Parenchymal atrophy and chronic small vessel ischemic disease. 3. Paranasal sinus disease at the imaged levels, as described. CT cervical spine: 1. No evidence of an acute cervical spine fracture. 2. Nonspecific reversal of the expected cervical lordosis. 3. Grade 1 spondylolisthesis at C3-C4, C4-C5, C5-C6 and C7-T1. 4. Cervical spondylosis as described. 5. Facet ankylosis on the right at C2-C3. Electronically Signed   By: Jackey Loge D.O.   On: 11/10/2022 19:48   CT Cervical Spine Wo Contrast  Result Date: 11/10/2022 CLINICAL DATA:  Ataxia, head trauma. Polytrauma, blunt. Additional history obtained: Lethargy. Fatigue. EXAM: CT HEAD WITHOUT CONTRAST CT CERVICAL SPINE WITHOUT CONTRAST TECHNIQUE: Multidetector CT imaging of the head and cervical spine was performed following the standard protocol without intravenous contrast.  Multiplanar CT image reconstructions of the cervical spine were also generated. RADIATION DOSE REDUCTION: This exam was performed according to the departmental dose-optimization program which includes automated exposure control, adjustment of the mA and/or kV according to patient size and/or use of iterative reconstruction technique. COMPARISON:  Head CT 08/25/2022.  Cervical spine CT 08/25/2022. FINDINGS: CT HEAD FINDINGS Brain: Prominence of the ventricles and sulci which appears commensurate with the degree of  cerebral volume loss. Patchy and ill-defined hypoattenuation within the cerebral white matter, nonspecific but compatible with moderate chronic small vessel ischemic disease. There is no acute intracranial hemorrhage. No demarcated cortical infarct. No extra-axial fluid collection. No evidence of an intracranial mass. No midline shift. Vascular: No hyperdense vessel.  Atherosclerotic calcifications Skull: No calvarial fracture or aggressive osseous lesion. Sinuses/Orbits: No mass or acute finding within the imaged orbits. Complete opacification of the right maxillary sinus at the imaged levels (with superimposed foci mineralization within the sinus). Partial opacification of anterior right ethmoid air cells, overall mild-to-moderate in severity. CT CERVICAL SPINE FINDINGS Alignment: Nonspecific reversal of the expected cervical lordosis. 3 mm C3-C4 grade 1 anterolisthesis. 2 mm C4-C5 grade 1 anterolisthesis. Slight C5-C6 grade 1 retrolisthesis. 2-3 mm C7-T1 grade 1 anterolisthesis. Skull base and vertebrae: The basion-dental and atlanto-dental intervals are maintained.No evidence of acute fracture to the cervical spine. Facet ankylosis on the right at C2-C3. Multilevel ventral osteophytes. Soft tissues and spinal canal: No prevertebral fluid or swelling. No visible canal hematoma. Disc levels: Cervical spondylosis with multilevel disc space narrowing, disc bulges/central disc protrusions, posterior disc osteophyte complexes, endplate spurring, uncovertebral hypertrophy and facet arthrosis. Disc space narrowing is greatest at C5-C6 and C6-C7 (advanced at these levels). Multilevel spinal canal narrowing. Most notably, a posterior disc osteophyte complex contributes to spinal canal stenosis at C5-C6 which is suspected moderate in severity. Multilevel bony neural foraminal narrowing. Degenerative changes also present at the C1-C2 articulation. Upper chest: No consolidation within the imaged lung apices. No visible  pneumothorax. Multiple thyroid nodules measuring up to 12 mm, not meeting consensus criteria for ultrasound follow-up based on size. No follow-up imaging recommended. Reference: J Am Coll Radiol. 2015 Feb;12(2): 143-50. IMPRESSION: CT head: 1.  No evidence of an acute intracranial abnormality. 2. Parenchymal atrophy and chronic small vessel ischemic disease. 3. Paranasal sinus disease at the imaged levels, as described. CT cervical spine: 1. No evidence of an acute cervical spine fracture. 2. Nonspecific reversal of the expected cervical lordosis. 3. Grade 1 spondylolisthesis at C3-C4, C4-C5, C5-C6 and C7-T1. 4. Cervical spondylosis as described. 5. Facet ankylosis on the right at C2-C3. Electronically Signed   By: Jackey Loge D.O.   On: 11/10/2022 19:48   DG Chest 1 View  Result Date: 11/10/2022 CLINICAL DATA:  Weakness EXAM: PORTABLE CHEST 1 VIEW COMPARISON:  08/25/2022 FINDINGS: Cardiac shadow is stable. Tortuous thoracic aorta is noted. The lungs are well aerated bilaterally. Stable scarring is noted in the midportion of both lungs. No new focal infiltrate is seen. IMPRESSION: No acute abnormality noted. Electronically Signed   By: Alcide Clever M.D.   On: 11/10/2022 19:48    Labs: Recent Labs    11/10/22 1629  WBC 13.8*  RBC 3.05*  HCT 28.3*  PLT 201   Recent Labs    11/10/22 1629  NA 140  K 3.9  CL 108  CO2 23  BUN 20  CREATININE 1.34*  GLUCOSE 131*  CALCIUM 8.7*   Recent Labs    11/10/22 1629  INR 1.9*  Review of Systems: ROS as detailed in HPI  Physical Exam: Body mass index is 29.08 kg/m.  Physical Exam   Gen: Pleasant, NAD Comfortable at rest  Left Lower Extremity: Skin intact LLE shortened, ER+ TTP over proximal femur ADF/APF/EHL 5/5 SILT throughout DP, PT 2+ to palp CR < 2s   Assessment and Plan: Ortho consult for left comminuted IT hip fx in 82 yo female with PMH paroxysmal afib/flutter on coumadin, CKD IIIb, HTN, and HLD who presented after a  fall 11/09/22    -history, exam and imaging reviewed at length -admitted to Medicine team, appreciate assistance -she will benefit from surgery in the form of left femur intramedullary nail fixation -please keep NPO from MN and hold VTE ppx for possible surgery 11/11/22 -monitor H&H periop, please keep blood on hold for OR -monitor INR periop, please bring down to <1.4 preop if safe to do so to minimize postop anemia -PT/OT postop  Netta Cedars, MD Orthopaedic Surgeon [facility] [phone]  Risks of surgery include hardware failure/irritation, new/persistent/recurrent infection, stiffness, nerve/vessel/tendon injury, nonunion/malunion of any fracture, wound healing issues, allograft usage, development of arthritis, failure of this surgery, possibility of external fixation in certain situations, possibility of delayed definitive surgery, need for further surgery, prolonged wound care including further soft tissue coverage procedures, thromboembolic events, anesthesia/medical complications/events perioperatively and beyond, amputation, death among others are relevant in this clinical situation.

## 2022-11-10 NOTE — ED Triage Notes (Signed)
Pt arrived via [facility] from home. Pt was just D/C'd home from rehab two days ago. Per pt's family pt has been lethargic, not eating much and fatiguex2wks, but worse the last two days.

## 2022-11-10 NOTE — ED Provider Notes (Signed)
[place] EMERGENCY DEPARTMENT AT [facility] Provider Note  CSN: [id number] Arrival date & time: 11/10/22 1546  Chief Complaint(s) Fatigue  HPI Brandi [name] is a 82 y.o. female here today for generalized weakness.  Patient was discharged from skilled nursing facility 2 days ago after she had gone there for several weeks following a fall.  She continues to be weak.  She says that she had a fall a few days ago.  She does take Coumadin.  She denies fever, cough, shortness of breath, abdominal pain, dysuria.   Past Medical History Past Medical History:  Diagnosis Date   Allergy    Hyperlipidemia    Hypertension    Neuromuscular disorder (HCC)    Venous insufficiency    Patient Active Problem List   Diagnosis Date Noted   Closed traumatic fractures of multiple bones of right hip and pelvis, initial encounter (HCC) 08/25/2022   Chronic kidney disease, stage 3b (HCC) 08/25/2022   Compression fracture of T10 vertebra (HCC) 08/25/2022   Essential hypertension 08/25/2022   Acute kidney injury superimposed on chronic kidney disease (HCC) 08/14/2021   Hypokalemia 08/14/2021   Generalized weakness 08/14/2021   Acute metabolic encephalopathy 08/14/2021   Paroxysmal atrial fibrillation (HCC) 08/14/2021   Normocytic anemia 08/14/2021   Long term (current) use of anticoagulants 08/13/2021   Secondary hypercoagulable state (HCC) 07/17/2021   Atrial flutter (HCC) 07/11/2021   CAP (community acquired pneumonia) 07/07/2021   Acute respiratory failure with hypoxia (HCC) 07/07/2021   AKI (acute kidney injury) (HCC) 07/07/2021   Troponin level elevated 07/07/2021   Vasovagal episode 06/08/2017   Mixed hyperlipidemia 06/08/2017   Chest pain with moderate risk for cardiac etiology 06/07/2017   Abnormal EKG 06/07/2017   Depression 03/18/2012   Mild obesity 03/18/2012   Home Medication(s) Prior to Admission medications   Medication Sig Start Date End Date Taking? Authorizing  Provider  acetaminophen (TYLENOL) 500 MG tablet Take 1,000 mg by mouth every 6 (six) hours as needed for mild pain.    [provider]  azelastine (ASTELIN) 0.1 % nasal spray Place 2 sprays into both nostrils daily. 06/07/19   [provider]  bisacodyl (DULCOLAX) 5 MG EC tablet Take 1 tablet (5 mg total) by mouth daily as needed for moderate constipation. 08/30/22   Azucena Fallen, MD  cetirizine (ZYRTEC) 10 MG tablet Take 10 mg by mouth daily as needed for allergies.    [provider]  diltiazem (CARDIZEM SR) 60 MG 12 hr capsule Take 60 mg by mouth 2 (two) times daily.    [provider]  ferrous sulfate 325 (65 FE) MG tablet Take 325 mg by mouth daily with breakfast.    [provider]  guanFACINE (TENEX) 2 MG tablet Take 2 mg by mouth every evening. 09/19/21   [provider]  HYDROcodone-acetaminophen (NORCO/VICODIN) 5-325 MG tablet Take 1-2 tablets by mouth every 4 (four) hours as needed for moderate pain. 08/30/22   Azucena Fallen, MD  Lidocaine HCl (ASPERCREME LIDOCAINE) 4 % LIQD Apply 1 Application topically 2 (two) times daily as needed (knee pain).    [provider]  lisinopril (ZESTRIL) 5 MG tablet Take 1 tablet (5 mg total) by mouth daily. 08/31/22   Azucena Fallen, MD  metoprolol tartrate (LOPRESSOR) 25 MG tablet Take 1 tablet (25 mg total) by mouth 2 (two) times daily. Patient taking differently: Take 25 mg by mouth daily. 04/13/22   Antonieta Iba, MD  Multiple Vitamins-Minerals (CENTRUM  SILVER 50+WOMEN) TABS Take 1 tablet by mouth every evening.    [provider]  OXYGEN Inhale 2 L/min into the lungs continuous.    [provider]  pantoprazole (PROTONIX) 40 MG tablet Take 1 tablet (40 mg total) by mouth daily. 07/12/21   Leroy Sea, MD  Propylene Glycol (SYSTANE COMPLETE PF OP) Place 1 drop into both eyes 3 (three) times daily.    [provider]  rosuvastatin (CRESTOR)  10 MG tablet Take 10 mg by mouth See admin instructions. Take 10 mg by mouth in the evening on Sun/Wed only (twice a week) 10/10/19   [provider]  senna-docusate (SENOKOT-S) 8.6-50 MG tablet Take 1 tablet by mouth at bedtime as needed for mild constipation. 08/30/22   Azucena Fallen, MD  sertraline (ZOLOFT) 50 MG tablet Take 75 mg by mouth at bedtime.    [provider]  warfarin (COUMADIN) 2 MG tablet Take 1/2 tablet (1mg ) to 1 tablet (2mg ) by mouth daily or as directed by Anticoagulation Clinic. Patient taking differently: Take 1-2 mg by mouth See admin instructions. Take 1/2 tablet (1mg ) by mouth every Monday, Wednesday, Friday and take 1 tablet (2mg ) all other days or as directed by Anticoagulation Clinic. 04/20/22   Antonieta Iba, MD                                                                                                                                    Past Surgical History Past Surgical History:  Procedure Laterality Date   ANKLE SURGERY Left    KNEE SURGERY Left    VARICOSE VEIN SURGERY     Family History Family History  Problem Relation Age of Onset   Heart attack Father     Social History Social History   Tobacco Use   Smoking status: Never   Smokeless tobacco: Never   Tobacco comments:    Never smoke 07/17/21  Vaping Use   Vaping status: Never Used  Substance Use Topics   Alcohol use: Never   Drug use: Never   Allergies Amoxil [amoxicillin] and Celebrex [celecoxib]  Review of Systems Review of Systems  Physical Exam Vital Signs  I have reviewed the triage vital signs BP (!) 176/88   Pulse 74   Temp 97.6 F (36.4 C) (Oral)   Resp 17   Ht 4\' 11"  (1.499 m)   Wt 65.3 kg   SpO2 99%   BMI 29.08 kg/m   Physical Exam Vitals reviewed.  HENT:     Head: Normocephalic and atraumatic.     Mouth/Throat:     Mouth: Mucous membranes are moist.  Cardiovascular:     Rate and Rhythm: Normal rate.  Pulmonary:     Effort:  Pulmonary effort is normal.  Abdominal:     General: Abdomen is flat. There is no distension.     Palpations: Abdomen is soft.  Musculoskeletal:  General: Normal range of motion.     Cervical back: Normal range of motion.     Comments: Tenderness to palpation over the left greater trochanter.  No shortening of the leg  Neurological:     General: No focal deficit present.     Mental Status: She is alert and oriented to person, place, and time.  Psychiatric:        Mood and Affect: Mood normal.     ED Results and Treatments Labs (all labs ordered are listed, but only abnormal results are displayed) Labs Reviewed  COMPREHENSIVE METABOLIC PANEL  CBC WITH DIFFERENTIAL/PLATELET  URINALYSIS, W/ REFLEX TO CULTURE (INFECTION SUSPECTED)  PROTIME-INR                                                                                                                          Radiology No results found.  Pertinent labs & imaging results that were available during my care of the patient were reviewed by me and considered in my medical decision making (see MDM for details).  Medications Ordered in ED Medications - No data to display                                                                                                                                   Procedures Procedures  (including critical care time)  Medical Decision Making / ED Course   This patient presents to the ED for concern of weakness, this involves an extensive number of treatment options, and is a complaint that carries with it a high risk of complications and morbidity.  The differential diagnosis includes underlying infection, dehydration, metabolic abnormalities, intracranial hemorrhage, hip fracture.  MDM: 82 year old female here today for generalized weakness.  Vague complaints, may simply be deconditioning.  Patient nontachycardic, slightly hypertensive, afebrile.  Vital signs overall look well.  Physical  exam I did not identify an obvious source of infection.  Will obtain imaging, routine labs.  Patient does appear weak, fatigued.  Likely would require physical therapy, TOC consultation if her workup or not to reveal a reversible cause for her symptoms.  Reassessment, patient with a left intertrochanteric femur fracture.  Have reached out to orthopedic surgery.  Nitrate positive urine.  Will admit patient to hospitalist.   Additional history obtained:  -External records from outside source obtained and reviewed including: Chart review including previous notes, labs, imaging, consultation notes  Lab Tests: -I ordered, reviewed, and interpreted labs.   The pertinent results include: Mild Labs Reviewed  COMPREHENSIVE METABOLIC PANEL  CBC WITH DIFFERENTIAL/PLATELET  URINALYSIS, W/ REFLEX TO CULTURE (INFECTION SUSPECTED)  PROTIME-INR      EKG sinus rhythm, no acute ischemia.  EKG Interpretation Date/Time:    Ventricular Rate:    PR Interval:    QRS Duration:    QT Interval:    QTC Calculation:   R Axis:      Text Interpretation:           Imaging Studies ordered: I ordered imaging studies including CT imaging of the head, pelvis, chest x-ray.  My independent review of the patient's head CT shows no intracranial hemorrhage.  My independent review the patient's chest x-ray shows no pneumonia. I independently visualized and interpreted imaging. I agree with the radiologist interpretation   Medicines ordered and prescription drug management: No orders of the defined types were placed in this encounter.   -I have reviewed the patients home medicines and have made adjustments as needed    Consultations Obtained: I requested consultation with the orthopedic surgery and hospitalist,  and discussed lab and imaging findings as well as pertinent plan - they recommend: Admission.   Cardiac Monitoring: The patient was maintained on a cardiac monitor.  I personally viewed and  interpreted the cardiac monitored which showed an underlying rhythm of: Sinus rhythm  Social Determinants of Health:  Factors impacting patients care include:   Reevaluation: After the interventions noted above, I reevaluated the patient and found that they have :stayed the same  Co morbidities that complicate the patient evaluation  Past Medical History:  Diagnosis Date   Allergy    Hyperlipidemia    Hypertension    Neuromuscular disorder (HCC)    Venous insufficiency       Dispostion: Admission.     Final Clinical Impression(s) / ED Diagnoses Final diagnoses:  None     @PCDICTATION @    Anders Simmonds T, DO 11/10/22 1956

## 2022-11-10 NOTE — H&P (Cosign Needed)
Date: 11/11/2022               Patient Name:  Brandi [name] MRN: [id number]  DOB: October 24, 1940 Age / Sex: 82 y.o., female   PCP: Eloisa Northern, MD         Medical Service: Internal Medicine Teaching Service         Attending Physician: Dr. Ginnie Smart, MD      First Contact: Dr. Kathleen Lime, MD Pager [phone]    Second Contact: Dr. Morene Crocker, MD Pager [phone]         After Hours (After 5p/  First Contact Pager: [phone]  weekends / holidays): Second Contact Pager: [phone]   SUBJECTIVE   Chief Complaint: weakness  History of Present Illness: Brandi [name] is an 82 yo female with paroxysmal afib/flutter on coumadin, CKD IIIb, HTN, and HLD who presented after a fall yesterday evening.   History is obtained from the patient's husband, Tasia Catchings, as the patient has memory issues. Tasia Catchings states that since the patient was hospitalized with pneumonia one year ago, she has had a gradual decline in her health and generally has had physical deconditioning, as well. In August of this year she fell and sustained right superior and inferior pubic rami fractures and a right sacral ala fracture. At that time ortho had recommended non-operative management and weight bearing as tolerated. Due to her significant weakness, she was discharged to a SNF. She then went to [facility] and was discharged to home about 2 weeks ago.   Tasia Catchings also mentions that the patient's appetite has been poor since over the last few months since she was at the SNF. Her strength has deteriorated. He describes that when she is in bed, she cannot sit up on her own. She has about 8 steps from her bed to her bathroom and can sometimes walk with a shuffling gait with the assistance of her walker, but this is quite slow. Tasia Catchings was told that while at the SNF, she could walk 20 ft, but he has not found this to be the case. Since she has been home, she spends most of her time sleeping. He describes that sometimes she  is alert, and other times her memory is not all there.   Last night, the patient fell while at her home. Tasia Catchings was not at home, but her son was home and he told Tasia Catchings about the fall this morning. The patient complained of some pain this morning and he was not able to help her get out of bed. Aside from the fall and pain, the patient had not endorsed any fevers, chills, chest pain, SOB, abd pain, n/v/d, dysuria, hematuria, or any other sxs at this time.     Meds:  Zyrtec 10 mg PRN Diltiazem 60 mg BID Ferrous sulfate 325 mg daily Metoprolol succinate 25 mg BID Guanfacine 2 mg at bedtime  Protonix 40 mg daily Rosuvastatin 10 mg on Wednesdays and Sundays Zoloft 50 mg at bedtime  Coumadin 1 mg MWF and 2 mg other days (last dose 10/21) Tylenol 500 mg PRN Lidocaine topically BID prn Womens multivitamin  No outpatient medications have been marked as taking for the 11/10/22 encounter [facility] Encounter).    Past Medical History  Past Surgical History:  Procedure Laterality Date   ANKLE SURGERY Left    KNEE SURGERY Left    VARICOSE VEIN SURGERY      Social:  Lives With: husband and son Support: family  Level of Function:  dependent of ADLs the last 2 weeks PCP: Eloisa Northern, MD Substances: No tobacco, previously had alcohol use disorder (has not drank in years), no other substance use   Allergies: Allergies as of 11/10/2022 - Review Complete 11/10/2022  Allergen Reaction Noted   Amoxil [amoxicillin] Diarrhea 04/30/2010   Celebrex [celecoxib] Other (See Comments) 07/08/2021    Review of Systems: A complete ROS was negative except as per HPI.   OBJECTIVE:   Physical Exam: Blood pressure (!) 158/66, pulse 69, temperature 97.6 F (36.4 C), temperature source Oral, resp. rate (!) 22, height 4\' 11"  (1.499 m), weight 65.3 kg, SpO2 100%.   Constitutional: chronically ill appearing female laying in bed, in no acute distress Cardiovascular: regular rate and rhythm, occassional extra  beat appreciated Pulmonary/Chest: normal work of breathing on room air, lungs clear to auscultation anteriorly Abdominal: soft, non-tender, non-distended MSK: tenderness to palpation of left greater trochanter; no shortening of leg appreciated but hip is slightly externally rotated Neurological: alert & oriented to self and place. No focal deficit. Skin: warm and dry Psych: normal mood and affect  Labs: CBC    Component Value Date/Time   WBC 13.8 (H) 11/10/2022 1629   RBC 3.05 (L) 11/10/2022 1629   HGB 8.6 (L) 11/10/2022 1629   HCT 28.3 (L) 11/10/2022 1629   PLT 201 11/10/2022 1629   MCV 92.8 11/10/2022 1629   MCH 28.2 11/10/2022 1629   MCHC 30.4 11/10/2022 1629   RDW 13.9 11/10/2022 1629   LYMPHSABS 0.8 11/10/2022 1629   MONOABS 0.8 11/10/2022 1629   EOSABS 0.0 11/10/2022 1629   BASOSABS 0.0 11/10/2022 1629     CMP     Component Value Date/Time   NA 140 11/10/2022 1629   K 3.9 11/10/2022 1629   CL 108 11/10/2022 1629   CO2 23 11/10/2022 1629   GLUCOSE 131 (H) 11/10/2022 1629   BUN 20 11/10/2022 1629   BUN 12 12/11/2011 0000   CREATININE 1.34 (H) 11/10/2022 1629   CALCIUM 8.7 (L) 11/10/2022 1629   PROT 6.1 (L) 11/10/2022 1629   ALBUMIN 3.0 (L) 11/10/2022 1629   AST 22 11/10/2022 1629   ALT 19 11/10/2022 1629   ALKPHOS 83 11/10/2022 1629   BILITOT 0.6 11/10/2022 1629   GFRNONAA 40 (L) 11/10/2022 1629    Imaging: DG HIP: Left intratrochanteric femoral fracture.   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  Chest Xray: No acute abnormality   EKG: personally reviewed my interpretation is normal sinus rhythm with some irregularity (possible extra beat). Prior EKG 08/2022 reviewed.   ASSESSMENT & PLAN:   Assessment & Plan by Problem: Principal Problem:   Closed intertrochanteric fracture of left femur, initial encounter (HCC)   Deici Patient is a 82 y.o. person living with a history of paroxysmal afib/flutter on coumadin, CKD IIIb, HTN, and HLD  who  presented with a fall and admitted for left intratrochanteric femur fracture on hospital day 1  Left intratrochanteric fracture History of right pubic rami fractures Imaging in ED showed acute left intratrochanteric femoral fracture with chronic right pubic rami fractures that are healing. Ortho was consulted from the ED, as patient may need surgical intervention.  - NPO midnight in anticipation of possible surgery  - Tylenol PRN for mild pain, can add additional meds if needed but for now, pain is well controlled - PT/OT once cleared by ortho for weight bearing  Physical deconditioning Patient has had an overall decline in her health, mental status, and physical ability  since she was hospitalized one year ago with pneumonia. Most recently has had a decline since she fell in August and had been at a SNF for ~ 2 months. Suspect her most recent fall is in the setting of her physical deconditioning.  - PT/OT  - TOC consult, suspect she will need SNF placement again  Asymptomatic bacteruria The patient has not complained of any fevers, chills, abdominal/suprapubic pain, dysuria, hematuria, or any other urinary sxs. UA was positive for nitrites and had many bacteria, but this could be asymptomatic bacteruria and not a true UTI. She received one dose of ceftriaxone in the ED, but will hold off re-ordering this. If she develops systemic signs of infection or endorses urinary sxs, will restart ceftriaxone. - Monitor for s/s of UTI - Daily CBC  Paroxysmal Afib Hypertension Patient is on coumadin, diltiazem 60 mg bid, metoprolol 25 mg bid, guanfacine 2 mg at bedtime, and lisinopril 5 mg daily. Holding coumadin given possibility of surgery but will resume all other medications. Last dose of coumadin the evening of 10/21 and INR today was 1.9.  - Daily INR  CKD IIIb  Cr 1.34, GFR 40, consistent with CKD IIIb.  - Daily BMP  Acute on chronic anemia Hb 8.6 on admission, down from baseline of 10-11.  Acute anemia could be secondary to blood loss in the setting of her fall/fracture while on coumadin.  - Monitor CBC - Hold coumadin   Hyperlipidemia On crestor 10 mg at bedtime only on Wednesdays and Sundays. Resumed.   Depression Resumed home sertraline 75 mg daily.   Diet: Normal VTE:  Held IVF: None Code: DNR  Prior to Admission Living Arrangement: Home, living husband Anticipated Discharge Location: SNF Barriers to Discharge: possible surgery, PT/OT  Dispo: Admit patient to Inpatient with expected length of stay greater than 2 midnights.  Signed: Chauncey Mann DO Internal Medicine Resident PGY-3  11/11/2022, 5:13 AM

## 2022-11-11 ENCOUNTER — Inpatient Hospital Stay (HOSPITAL_COMMUNITY): Payer: Medicare Other

## 2022-11-11 ENCOUNTER — Inpatient Hospital Stay (HOSPITAL_COMMUNITY): Payer: Medicare Other | Admitting: Certified Registered Nurse Anesthetist

## 2022-11-11 ENCOUNTER — Encounter (HOSPITAL_COMMUNITY)
Admission: EM | Disposition: A | Discharge status: Skilled Nursing Facility | Payer: Self-pay | Source: Home / Self Care | Attending: Internal Medicine

## 2022-11-11 ENCOUNTER — Encounter (HOSPITAL_COMMUNITY): Payer: Self-pay | Admitting: Infectious Diseases

## 2022-11-11 DIAGNOSIS — R8271 Bacteriuria: Secondary | ICD-10-CM

## 2022-11-11 DIAGNOSIS — I129 Hypertensive chronic kidney disease with stage 1 through stage 4 chronic kidney disease, or unspecified chronic kidney disease: Secondary | ICD-10-CM | POA: Diagnosis not present

## 2022-11-11 DIAGNOSIS — S72142A Displaced intertrochanteric fracture of left femur, initial encounter for closed fracture: Secondary | ICD-10-CM | POA: Diagnosis not present

## 2022-11-11 DIAGNOSIS — R296 Repeated falls: Secondary | ICD-10-CM | POA: Diagnosis not present

## 2022-11-11 DIAGNOSIS — N189 Chronic kidney disease, unspecified: Secondary | ICD-10-CM | POA: Diagnosis not present

## 2022-11-11 HISTORY — PX: INTRAMEDULLARY (IM) NAIL INTERTROCHANTERIC: SHX5875

## 2022-11-11 LAB — SURGICAL PCR SCREEN
MRSA, PCR: NEGATIVE
Staphylococcus aureus: POSITIVE — AB

## 2022-11-11 LAB — BASIC METABOLIC PANEL
Anion gap: 11 (ref 5–15)
BUN: 19 mg/dL (ref 8–23)
CO2: 23 mmol/L (ref 22–32)
Calcium: 8.8 mg/dL — ABNORMAL LOW (ref 8.9–10.3)
Chloride: 105 mmol/L (ref 98–111)
Creatinine, Ser: 1.28 mg/dL — ABNORMAL HIGH (ref 0.44–1.00)
GFR, Estimated: 42 mL/min — ABNORMAL LOW (ref >60)
Glucose, Bld: 130 mg/dL — ABNORMAL HIGH (ref 70–99)
Potassium: 4.1 mmol/L (ref 3.5–5.1)
Sodium: 139 mmol/L (ref 135–145)

## 2022-11-11 LAB — ABO/RH: ABO/RH(D): A POS

## 2022-11-11 LAB — CBC
HCT: 25.2 % — ABNORMAL LOW (ref 36.0–46.0)
Hemoglobin: 7.8 g/dL — ABNORMAL LOW (ref 12.0–15.0)
MCH: 28.2 pg (ref 26.0–34.0)
MCHC: 31 g/dL (ref 30.0–36.0)
MCV: 91 fL (ref 80.0–100.0)
Platelets: 191 10*3/uL (ref 150–400)
RBC: 2.77 MIL/uL — ABNORMAL LOW (ref 3.87–5.11)
RDW: 14 % (ref 11.5–15.5)
WBC: 12.5 10*3/uL — ABNORMAL HIGH (ref 4.0–10.5)
nRBC: 0 % (ref 0.0–0.2)

## 2022-11-11 LAB — PREPARE RBC (CROSSMATCH)

## 2022-11-11 LAB — PROTIME-INR
INR: 2.2 — ABNORMAL HIGH (ref 0.8–1.2)
Prothrombin Time: 24.8 s — ABNORMAL HIGH (ref 11.4–15.2)

## 2022-11-11 SURGERY — FIXATION, FRACTURE, INTERTROCHANTERIC, WITH INTRAMEDULLARY ROD
Anesthesia: General | Laterality: Left

## 2022-11-11 MED ORDER — VANCOMYCIN HCL 1 G IV SOLR
INTRAVENOUS | Status: DC | PRN
  Administered 2022-11-11: 1000 mg

## 2022-11-11 MED ORDER — LIDOCAINE HCL (PF) 2 % IJ SOLN
INTRAMUSCULAR | Status: AC
  Filled 2022-11-11: qty 5

## 2022-11-11 MED ORDER — CHLORHEXIDINE GLUCONATE CLOTH 2 % EX PADS
6.0000 | MEDICATED_PAD | Freq: Every day | CUTANEOUS | Status: AC
  Administered 2022-11-12 – 2022-11-16 (×5): 6 via TOPICAL

## 2022-11-11 MED ORDER — STERILE WATER FOR IRRIGATION IR SOLN
Status: DC | PRN
  Administered 2022-11-11: 1000 mL

## 2022-11-11 MED ORDER — PHENYLEPHRINE 80 MCG/ML (10ML) SYRINGE FOR IV PUSH (FOR BLOOD PRESSURE SUPPORT)
PREFILLED_SYRINGE | INTRAVENOUS | Status: AC
  Filled 2022-11-11: qty 10

## 2022-11-11 MED ORDER — ONDANSETRON HCL 4 MG/2ML IJ SOLN: 4.0000 mg | Freq: Once | INTRAMUSCULAR | Status: DC | PRN

## 2022-11-11 MED ORDER — OXYCODONE HCL 5 MG PO TABS
2.5000 mg | ORAL_TABLET | ORAL | Status: DC | PRN
  Administered 2022-11-12 – 2022-11-14 (×2): 5 mg via ORAL
  Filled 2022-11-11 (×2): qty 1

## 2022-11-11 MED ORDER — ROCURONIUM BROMIDE 10 MG/ML (PF) SYRINGE
PREFILLED_SYRINGE | INTRAVENOUS | Status: DC | PRN
  Administered 2022-11-11: 60 mg via INTRAVENOUS

## 2022-11-11 MED ORDER — ONDANSETRON HCL 4 MG/2ML IJ SOLN
INTRAMUSCULAR | Status: AC
  Filled 2022-11-11: qty 2

## 2022-11-11 MED ORDER — FENTANYL CITRATE PF 50 MCG/ML IJ SOSY: 25.0000 ug | PREFILLED_SYRINGE | INTRAMUSCULAR | Status: DC | PRN

## 2022-11-11 MED ORDER — ISOPROPYL ALCOHOL 70 % SOLN
Status: DC | PRN
  Administered 2022-11-11: 1 via TOPICAL

## 2022-11-11 MED ORDER — CEFAZOLIN SODIUM-DEXTROSE 2-4 GM/100ML-% IV SOLN
2.0000 g | Freq: Once | INTRAVENOUS | Status: AC
  Administered 2022-11-11: 2 g via INTRAVENOUS

## 2022-11-11 MED ORDER — 0.9 % SODIUM CHLORIDE (POUR BTL) OPTIME
TOPICAL | Status: DC | PRN
  Administered 2022-11-11: 1000 mL

## 2022-11-11 MED ORDER — BUPIVACAINE-EPINEPHRINE 0.25% -1:200000 IJ SOLN
INTRAMUSCULAR | Status: DC | PRN
  Administered 2022-11-11: 30 mL

## 2022-11-11 MED ORDER — LIDOCAINE 2% (20 MG/ML) 5 ML SYRINGE
INTRAMUSCULAR | Status: DC | PRN
  Administered 2022-11-11: 80 mg via INTRAVENOUS

## 2022-11-11 MED ORDER — LACTATED RINGERS IV SOLN: INTRAVENOUS | Status: DC | PRN

## 2022-11-11 MED ORDER — FENTANYL CITRATE (PF) 100 MCG/2ML IJ SOLN
INTRAMUSCULAR | Status: AC
  Filled 2022-11-11: qty 2

## 2022-11-11 MED ORDER — BUPIVACAINE-EPINEPHRINE (PF) 0.5% -1:200000 IJ SOLN
INTRAMUSCULAR | Status: AC
  Filled 2022-11-11: qty 30

## 2022-11-11 MED ORDER — SODIUM CHLORIDE 0.9 % IV SOLN: INTRAVENOUS | Status: DC | PRN

## 2022-11-11 MED ORDER — TRANEXAMIC ACID-NACL 1000-0.7 MG/100ML-% IV SOLN
INTRAVENOUS | Status: AC
  Filled 2022-11-11: qty 100

## 2022-11-11 MED ORDER — PROPOFOL 10 MG/ML IV BOLUS
INTRAVENOUS | Status: AC
Start: 2022-11-11 — End: ?
  Filled 2022-11-11: qty 20

## 2022-11-11 MED ORDER — CEFAZOLIN SODIUM-DEXTROSE 2-4 GM/100ML-% IV SOLN
INTRAVENOUS | Status: AC
  Filled 2022-11-11: qty 100

## 2022-11-11 MED ORDER — PHENYLEPHRINE 80 MCG/ML (10ML) SYRINGE FOR IV PUSH (FOR BLOOD PRESSURE SUPPORT)
PREFILLED_SYRINGE | INTRAVENOUS | Status: DC | PRN
  Administered 2022-11-11 (×3): 160 ug via INTRAVENOUS

## 2022-11-11 MED ORDER — ONDANSETRON HCL 4 MG/2ML IJ SOLN
INTRAMUSCULAR | Status: DC | PRN
  Administered 2022-11-11: 4 mg via INTRAVENOUS

## 2022-11-11 MED ORDER — SUGAMMADEX SODIUM 200 MG/2ML IV SOLN
INTRAVENOUS | Status: DC | PRN
  Administered 2022-11-11: 150 mg via INTRAVENOUS

## 2022-11-11 MED ORDER — VANCOMYCIN HCL 1000 MG IV SOLR
INTRAVENOUS | Status: AC
  Filled 2022-11-11: qty 20

## 2022-11-11 MED ORDER — TRANEXAMIC ACID-NACL 1000-0.7 MG/100ML-% IV SOLN
1000.0000 mg | INTRAVENOUS | Status: AC
  Administered 2022-11-11: 1000 mg via INTRAVENOUS

## 2022-11-11 MED ORDER — ROCURONIUM BROMIDE 10 MG/ML (PF) SYRINGE
PREFILLED_SYRINGE | INTRAVENOUS | Status: AC
  Filled 2022-11-11: qty 10

## 2022-11-11 MED ORDER — SODIUM CHLORIDE (PF) 0.9 % IJ SOLN
INTRAMUSCULAR | Status: AC
  Filled 2022-11-11: qty 10

## 2022-11-11 MED ORDER — MUPIROCIN 2 % EX OINT
1.0000 | TOPICAL_OINTMENT | Freq: Two times a day (BID) | CUTANEOUS | Status: DC
  Administered 2022-11-11 – 2022-11-16 (×8): 1 via NASAL
  Filled 2022-11-11: qty 22

## 2022-11-11 MED ORDER — FENTANYL CITRATE (PF) 100 MCG/2ML IJ SOLN
INTRAMUSCULAR | Status: DC | PRN
  Administered 2022-11-11: 50 ug via INTRAVENOUS
  Administered 2022-11-11 (×2): 25 ug via INTRAVENOUS
  Administered 2022-11-11: 50 ug via INTRAVENOUS

## 2022-11-11 MED ORDER — DEXAMETHASONE SODIUM PHOSPHATE 10 MG/ML IJ SOLN
INTRAMUSCULAR | Status: AC
  Filled 2022-11-11: qty 1

## 2022-11-11 MED ORDER — DEXAMETHASONE SODIUM PHOSPHATE 4 MG/ML IJ SOLN
INTRAMUSCULAR | Status: DC | PRN
  Administered 2022-11-11: 4 mg via INTRAVENOUS

## 2022-11-11 MED ORDER — PROPOFOL 10 MG/ML IV BOLUS
INTRAVENOUS | Status: DC | PRN
  Administered 2022-11-11: 80 mg via INTRAVENOUS

## 2022-11-11 MED ORDER — ACETAMINOPHEN 10 MG/ML IV SOLN
INTRAVENOUS | Status: AC
Start: 2022-11-11 — End: ?
  Filled 2022-11-11: qty 100

## 2022-11-11 MED ORDER — CEFAZOLIN SODIUM-DEXTROSE 2-4 GM/100ML-% IV SOLN: 2.0000 g | Freq: Three times a day (TID) | INTRAVENOUS | Status: DC

## 2022-11-11 MED ORDER — HYDROMORPHONE HCL 1 MG/ML IJ SOLN: 0.5000 mg | INTRAMUSCULAR | Status: DC | PRN

## 2022-11-11 SURGICAL SUPPLY — 50 items
ADH SKN CLS APL DERMABOND .7 (GAUZE/BANDAGES/DRESSINGS) ×1
APL PRP STRL LF DISP 70% ISPRP (MISCELLANEOUS) ×1
BAG COUNTER SPONGE SURGICOUNT (BAG) ×1 IMPLANT
BAG SPNG CNTER NS LX DISP (BAG) ×1
BIT DRILL INTERTAN LAG SCREW (BIT) IMPLANT
BIT DRILL SHORT 4.0 (BIT) IMPLANT
CHLORAPREP W/TINT 26 (MISCELLANEOUS) ×1 IMPLANT
COVER MAYO STAND STRL (DRAPES) IMPLANT
COVER PERINEAL POST (MISCELLANEOUS) ×1 IMPLANT
COVER SURGICAL LIGHT HANDLE (MISCELLANEOUS) ×1 IMPLANT
DERMABOND ADVANCED .7 DNX12 (GAUZE/BANDAGES/DRESSINGS) IMPLANT
DRAPE C-ARM 42X120 X-RAY (DRAPES) ×1 IMPLANT
DRAPE C-ARMOR (DRAPES) ×1 IMPLANT
DRAPE STERI IOBAN 125X83 (DRAPES) ×1 IMPLANT
DRAPE U-SHAPE 47X51 STRL (DRAPES) ×2 IMPLANT
DRESSING MEPILEX FLEX 4X4 (GAUZE/BANDAGES/DRESSINGS) IMPLANT
DRILL BIT SHORT 4.0 (BIT) ×2
DRSG MEPILEX FLEX 4X4 (GAUZE/BANDAGES/DRESSINGS) ×2
DRSG MEPILEX POST OP 4X8 (GAUZE/BANDAGES/DRESSINGS) IMPLANT
DRSG TEGADERM 4X4.75 (GAUZE/BANDAGES/DRESSINGS) ×3 IMPLANT
ELECT REM PT RETURN 15FT ADLT (MISCELLANEOUS) IMPLANT
GAUZE SPONGE 4X4 12PLY STRL (GAUZE/BANDAGES/DRESSINGS) ×1 IMPLANT
GLOVE BIO SURGEON STRL SZ 6.5 (GLOVE) ×1 IMPLANT
GLOVE BIOGEL PI IND STRL 6.5 (GLOVE) ×1 IMPLANT
GLOVE BIOGEL PI IND STRL 8 (GLOVE) ×1 IMPLANT
GLOVE SURG ORTHO 8.0 STRL STRW (GLOVE) ×1 IMPLANT
GOWN STRL REUS W/ TWL LRG LVL3 (GOWN DISPOSABLE) ×1 IMPLANT
GOWN STRL REUS W/ TWL XL LVL3 (GOWN DISPOSABLE) ×2 IMPLANT
GOWN STRL REUS W/TWL LRG LVL3 (GOWN DISPOSABLE) ×1
GOWN STRL REUS W/TWL XL LVL3 (GOWN DISPOSABLE) ×2
GUIDE PIN 3.2X343 (PIN) ×2
GUIDE PIN 3.2X343MM (PIN) ×2
GUIDE ROD 3.0 (MISCELLANEOUS) ×1
KIT BASIN OR (CUSTOM PROCEDURE TRAY) ×1 IMPLANT
KIT TURNOVER KIT A (KITS) IMPLANT
MANIFOLD NEPTUNE II (INSTRUMENTS) ×1 IMPLANT
NAIL LOCK CANN 10X360 130D LT (Nail) IMPLANT
NS IRRIG 1000ML POUR BTL (IV SOLUTION) ×1 IMPLANT
PACK GENERAL/GYN (CUSTOM PROCEDURE TRAY) ×1 IMPLANT
PIN GUIDE 3.2X343MM (PIN) IMPLANT
ROD GUIDE 3.0 (MISCELLANEOUS) IMPLANT
SCREW LAG COMPR KIT 95/90 (Screw) IMPLANT
SCREW TRIGEN LOW PROF 5.0X32.5 (Screw) IMPLANT
STRIP CLOSURE SKIN 1/2X4 (GAUZE/BANDAGES/DRESSINGS) ×1 IMPLANT
SUT MNCRL AB 3-0 PS2 18 (SUTURE) ×1 IMPLANT
SUT VIC AB 0 CT1 36 (SUTURE) ×1 IMPLANT
SUT VIC AB 1 CT1 36 (SUTURE) ×1 IMPLANT
SUT VIC AB 2-0 CT2 27 (SUTURE) ×2 IMPLANT
TOWEL OR 17X26 10 PK STRL BLUE (TOWEL DISPOSABLE) ×1 IMPLANT
TOWEL OR NON WOVEN STRL DISP B (DISPOSABLE) ×1 IMPLANT

## 2022-11-11 NOTE — Transfer of Care (Signed)
Immediate Anesthesia Transfer of Care Note  Patient: Brandi [name]  Procedure(s) Performed: INTRAMEDULLARY (IM) NAIL INTERTROCHANTERIC (Left)  Patient Location: PACU  Anesthesia Type:General  Level of Consciousness: drowsy  Airway & Oxygen Therapy: Patient Spontanous Breathing and Patient connected to face mask oxygen  Post-op Assessment: Report given to RN and Post -op Vital signs reviewed and stable  Post vital signs: Reviewed and stable  Last Vitals:  Vitals Value Taken Time  BP 150/115 11/11/22 1531  Temp    Pulse 89 11/11/22 1533  Resp    SpO2 100 % 11/11/22 1533  Vitals shown include unfiled device data.  Last Pain:  Vitals:   11/11/22 1129  TempSrc: Oral  PainSc: 0-No pain      Patients Stated Pain Goal: 0 (11/11/22 0305)  Complications: No notable events documented.

## 2022-11-11 NOTE — ED Notes (Signed)
ED TO INPATIENT HANDOFF REPORT  ED Nurse Name and Phone #: Juliette Alcide RN [phone]  S Name/Age/Gender Brandi [name] 82 y.o. female Room/Bed: 002C/002C  Code Status   Code Status: Limited: Do not attempt resuscitation (DNR) -DNR-LIMITED -Do Not Intubate/DNI   Home/SNF/Other Home Patient oriented to: self Is this baseline? Yes   Triage Complete: Triage complete  Chief Complaint Closed intertrochanteric fracture of left femur, initial encounter [facility]) [S72.142A]  Triage Note Pt arrived via [facility] from home. Pt was just D/C'd home from rehab two days ago. Per pt's family pt has been lethargic, not eating much and fatiguex2wks, but worse the last two days.   Allergies Allergies  Allergen Reactions   Amoxil [Amoxicillin] Diarrhea   Celebrex [Celecoxib] Other (See Comments)    Bruising and stomach pain    Level of Care/Admitting Diagnosis ED Disposition     ED Disposition  Admit   Condition  --   Comment  Hospital Area: [facility] [[id number]]  Level of Care: Med-Surg [16]  May admit patient to [facility] or [facility] if equivalent level of care is available:: No  Covid Evaluation: Asymptomatic - no recent exposure (last 10 days) testing not required  Diagnosis: Closed intertrochanteric fracture of left femur, initial encounter [facility]) [[id number]]  Admitting Physician: Ginnie Smart [[id number]]  Attending Physician: Ninetta Lights, JEFFREY C [[id number]]  Certification:: I certify this patient will need inpatient services for at least 2 midnights  Expected Medical Readiness: 11/12/2022          B Medical/Surgery History Past Medical History:  Diagnosis Date   Allergy    Hyperlipidemia    Hypertension    Neuromuscular disorder (HCC)    Venous insufficiency    Past Surgical History:  Procedure Laterality Date   ANKLE SURGERY Left    KNEE SURGERY Left    VARICOSE VEIN SURGERY       A IV Location/Drains/Wounds Patient Lines/Drains/Airways Status     Active  Line/Drains/Airways     Name Placement date Placement time Site Days   Peripheral IV 11/10/22 18 G Left Antecubital 11/10/22  --  Antecubital  1            Intake/Output Last 24 hours  Intake/Output Summary (Last 24 hours) at 11/11/2022 0209 Last data filed at 11/10/2022 2102 Gross per 24 hour  Intake 100 ml  Output --  Net 100 ml    Labs/Imaging Results for orders placed or performed during the hospital encounter of 11/10/22 (from the past 48 hour(s))  Comprehensive metabolic panel     Status: Abnormal   Collection Time: 11/10/22  4:29 PM  Result Value Ref Range   Sodium 140 135 - 145 mmol/L   Potassium 3.9 3.5 - 5.1 mmol/L   Chloride 108 98 - 111 mmol/L   CO2 23 22 - 32 mmol/L   Glucose, Bld 131 (H) 70 - 99 mg/dL    Comment: Glucose reference range applies only to samples taken after fasting for at least 8 hours.   BUN 20 8 - 23 mg/dL   Creatinine, Ser 0.98 (H) 0.44 - 1.00 mg/dL   Calcium 8.7 (L) 8.9 - 10.3 mg/dL   Total Protein 6.1 (L) 6.5 - 8.1 g/dL   Albumin 3.0 (L) 3.5 - 5.0 g/dL   AST 22 15 - 41 U/L   ALT 19 0 - 44 U/L   Alkaline Phosphatase 83 38 - 126 U/L   Total Bilirubin 0.6 0.3 - 1.2 mg/dL   GFR,  Estimated 40 (L) >60 mL/min    Comment: (NOTE) Calculated using the CKD-EPI Creatinine Equation (2021)    Anion gap 9 5 - 15    Comment: Performed at [facility], [address], [place], [place] [address]  CBC with Differential     Status: Abnormal   Collection Time: 11/10/22  4:29 PM  Result Value Ref Range   WBC 13.8 (H) 4.0 - 10.5 K/uL   RBC 3.05 (L) 3.87 - 5.11 MIL/uL   Hemoglobin 8.6 (L) 12.0 - 15.0 g/dL   HCT 95.2 (L) 84.1 - 32.4 %   MCV 92.8 80.0 - 100.0 fL   MCH 28.2 26.0 - 34.0 pg   MCHC 30.4 30.0 - 36.0 g/dL   RDW 40.1 02.7 - 25.3 %   Platelets 201 150 - 400 K/uL   nRBC 0.0 0.0 - 0.2 %   Neutrophils Relative % 87 %   Neutro Abs 12.0 (H) 1.7 - 7.7 K/uL   Lymphocytes Relative 6 %   Lymphs Abs 0.8 0.7 - 4.0 K/uL   Monocytes Relative 6  %   Monocytes Absolute 0.8 0.1 - 1.0 K/uL   Eosinophils Relative 0 %   Eosinophils Absolute 0.0 0.0 - 0.5 K/uL   Basophils Relative 0 %   Basophils Absolute 0.0 0.0 - 0.1 K/uL   Immature Granulocytes 1 %   Abs Immature Granulocytes 0.09 (H) 0.00 - 0.07 K/uL    Comment: Performed at [facility], [address], [place], [place] [address]  Protime-INR     Status: Abnormal   Collection Time: 11/10/22  4:29 PM  Result Value Ref Range   Prothrombin Time 21.8 (H) 11.4 - 15.2 seconds   INR 1.9 (H) 0.8 - 1.2    Comment: (NOTE) INR goal varies based on device and disease states. Performed at [facility], [address], [place], [place] [address]   Urinalysis, w/ Reflex to Culture (Infection Suspected) -Urine, Catheterized     Status: Abnormal   Collection Time: 11/10/22  6:22 PM  Result Value Ref Range   Specimen Source URINE, CLEAN CATCH    Color, Urine YELLOW YELLOW   APPearance HAZY (A) CLEAR   Specific Gravity, Urine 1.011 1.005 - 1.030   pH 5.0 5.0 - 8.0   Glucose, UA NEGATIVE NEGATIVE mg/dL   Hgb urine dipstick NEGATIVE NEGATIVE   Bilirubin Urine NEGATIVE NEGATIVE   Ketones, ur NEGATIVE NEGATIVE mg/dL   Protein, ur 595 (A) NEGATIVE mg/dL   Nitrite POSITIVE (A) NEGATIVE   Leukocytes,Ua SMALL (A) NEGATIVE   RBC / HPF 0-5 0 - 5 RBC/hpf   WBC, UA 6-10 0 - 5 WBC/hpf    Comment:        Reflex urine culture not performed if WBC <=10, OR if Squamous epithelial cells >5. If Squamous epithelial cells >5 suggest recollection.    Bacteria, UA MANY (A) NONE SEEN   Squamous Epithelial / HPF 0-5 0 - 5 /HPF   Mucus PRESENT     Comment: Performed at [facility], [address], [place], [place] [address]   DG FEMUR MIN 2 VIEWS LEFT  Result Date: 11/11/2022 CLINICAL DATA:  Intertrochanteric femur fracture. EXAM: LEFT FEMUR 2 VIEWS COMPARISON:  Hip radiograph earlier today. FINDINGS: Unchanged alignment of intertrochanteric femur fracture with displacement of the  lesser and greater trochanters. The distal femur is intact. Prior knee arthroplasty. Vascular calcifications are seen. IMPRESSION: Unchanged alignment of intertrochanteric femur fracture. Electronically Signed  By: Narda Rutherford M.D.   On: 11/11/2022 00:02   DG Hip Unilat With Pelvis 2-3 Views Left  Result Date: 11/10/2022 CLINICAL DATA:  Recent fall with hip pain, initial encounter EXAM: DG HIP (WITH OR WITHOUT PELVIS) 3V LEFT COMPARISON:  08/25/2022 FINDINGS: Intratrochanteric left femoral fracture is noted with impaction and angulation at the fracture site. The femoral head is well seated. Pelvic ring is intact. Old healed superior and inferior pubic rami fractures on the right are noted. IMPRESSION: Left intratrochanteric femoral fracture. 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 Electronically Signed   By: Alcide Clever M.D.   On: 11/10/2022 19:49   CT Head Wo Contrast  Result Date: 11/10/2022 CLINICAL DATA:  Ataxia, head trauma. Polytrauma, blunt. Additional history obtained: Lethargy. Fatigue. EXAM: CT HEAD WITHOUT CONTRAST CT CERVICAL SPINE WITHOUT CONTRAST TECHNIQUE: Multidetector CT imaging of the head and cervical spine was performed following the standard protocol without intravenous contrast. Multiplanar CT image reconstructions of the cervical spine were also generated. RADIATION DOSE REDUCTION: This exam was performed according to the departmental dose-optimization program which includes automated exposure control, adjustment of the mA and/or kV according to patient size and/or use of iterative reconstruction technique. COMPARISON:  Head CT 08/25/2022.  Cervical spine CT 08/25/2022. FINDINGS: CT HEAD FINDINGS Brain: Prominence of the ventricles and sulci which appears commensurate with the degree of cerebral volume loss. Patchy and ill-defined hypoattenuation within the cerebral white matter, nonspecific but compatible with moderate chronic small vessel ischemic disease. There is  no acute intracranial hemorrhage. No demarcated cortical infarct. No extra-axial fluid collection. No evidence of an intracranial mass. No midline shift. Vascular: No hyperdense vessel.  Atherosclerotic calcifications Skull: No calvarial fracture or aggressive osseous lesion. Sinuses/Orbits: No mass or acute finding within the imaged orbits. Complete opacification of the right maxillary sinus at the imaged levels (with superimposed foci mineralization within the sinus). Partial opacification of anterior right ethmoid air cells, overall mild-to-moderate in severity. CT CERVICAL SPINE FINDINGS Alignment: Nonspecific reversal of the expected cervical lordosis. 3 mm C3-C4 grade 1 anterolisthesis. 2 mm C4-C5 grade 1 anterolisthesis. Slight C5-C6 grade 1 retrolisthesis. 2-3 mm C7-T1 grade 1 anterolisthesis. Skull base and vertebrae: The basion-dental and atlanto-dental intervals are maintained.No evidence of acute fracture to the cervical spine. Facet ankylosis on the right at C2-C3. Multilevel ventral osteophytes. Soft tissues and spinal canal: No prevertebral fluid or swelling. No visible canal hematoma. Disc levels: Cervical spondylosis with multilevel disc space narrowing, disc bulges/central disc protrusions, posterior disc osteophyte complexes, endplate spurring, uncovertebral hypertrophy and facet arthrosis. Disc space narrowing is greatest at C5-C6 and C6-C7 (advanced at these levels). Multilevel spinal canal narrowing. Most notably, a posterior disc osteophyte complex contributes to spinal canal stenosis at C5-C6 which is suspected moderate in severity. Multilevel bony neural foraminal narrowing. Degenerative changes also present at the C1-C2 articulation. Upper chest: No consolidation within the imaged lung apices. No visible pneumothorax. Multiple thyroid nodules measuring up to 12 mm, not meeting consensus criteria for ultrasound follow-up based on size. No follow-up imaging recommended. Reference: J Am Coll  Radiol. 2015 Feb;12(2): 143-50. IMPRESSION: CT head: 1.  No evidence of an acute intracranial abnormality. 2. Parenchymal atrophy and chronic small vessel ischemic disease. 3. Paranasal sinus disease at the imaged levels, as described. CT cervical spine: 1. No evidence of an acute cervical spine fracture. 2. Nonspecific reversal of the expected cervical lordosis. 3. Grade 1 spondylolisthesis at C3-C4, C4-C5, C5-C6 and C7-T1. 4. Cervical spondylosis as described. 5. Facet ankylosis  on the right at C2-C3. Electronically Signed   By: Jackey Loge D.O.   On: 11/10/2022 19:48   CT Cervical Spine Wo Contrast  Result Date: 11/10/2022 CLINICAL DATA:  Ataxia, head trauma. Polytrauma, blunt. Additional history obtained: Lethargy. Fatigue. EXAM: CT HEAD WITHOUT CONTRAST CT CERVICAL SPINE WITHOUT CONTRAST TECHNIQUE: Multidetector CT imaging of the head and cervical spine was performed following the standard protocol without intravenous contrast. Multiplanar CT image reconstructions of the cervical spine were also generated. RADIATION DOSE REDUCTION: This exam was performed according to the departmental dose-optimization program which includes automated exposure control, adjustment of the mA and/or kV according to patient size and/or use of iterative reconstruction technique. COMPARISON:  Head CT 08/25/2022.  Cervical spine CT 08/25/2022. FINDINGS: CT HEAD FINDINGS Brain: Prominence of the ventricles and sulci which appears commensurate with the degree of cerebral volume loss. Patchy and ill-defined hypoattenuation within the cerebral white matter, nonspecific but compatible with moderate chronic small vessel ischemic disease. There is no acute intracranial hemorrhage. No demarcated cortical infarct. No extra-axial fluid collection. No evidence of an intracranial mass. No midline shift. Vascular: No hyperdense vessel.  Atherosclerotic calcifications Skull: No calvarial fracture or aggressive osseous lesion. Sinuses/Orbits:  No mass or acute finding within the imaged orbits. Complete opacification of the right maxillary sinus at the imaged levels (with superimposed foci mineralization within the sinus). Partial opacification of anterior right ethmoid air cells, overall mild-to-moderate in severity. CT CERVICAL SPINE FINDINGS Alignment: Nonspecific reversal of the expected cervical lordosis. 3 mm C3-C4 grade 1 anterolisthesis. 2 mm C4-C5 grade 1 anterolisthesis. Slight C5-C6 grade 1 retrolisthesis. 2-3 mm C7-T1 grade 1 anterolisthesis. Skull base and vertebrae: The basion-dental and atlanto-dental intervals are maintained.No evidence of acute fracture to the cervical spine. Facet ankylosis on the right at C2-C3. Multilevel ventral osteophytes. Soft tissues and spinal canal: No prevertebral fluid or swelling. No visible canal hematoma. Disc levels: Cervical spondylosis with multilevel disc space narrowing, disc bulges/central disc protrusions, posterior disc osteophyte complexes, endplate spurring, uncovertebral hypertrophy and facet arthrosis. Disc space narrowing is greatest at C5-C6 and C6-C7 (advanced at these levels). Multilevel spinal canal narrowing. Most notably, a posterior disc osteophyte complex contributes to spinal canal stenosis at C5-C6 which is suspected moderate in severity. Multilevel bony neural foraminal narrowing. Degenerative changes also present at the C1-C2 articulation. Upper chest: No consolidation within the imaged lung apices. No visible pneumothorax. Multiple thyroid nodules measuring up to 12 mm, not meeting consensus criteria for ultrasound follow-up based on size. No follow-up imaging recommended. Reference: J Am Coll Radiol. 2015 Feb;12(2): 143-50. IMPRESSION: CT head: 1.  No evidence of an acute intracranial abnormality. 2. Parenchymal atrophy and chronic small vessel ischemic disease. 3. Paranasal sinus disease at the imaged levels, as described. CT cervical spine: 1. No evidence of an acute cervical  spine fracture. 2. Nonspecific reversal of the expected cervical lordosis. 3. Grade 1 spondylolisthesis at C3-C4, C4-C5, C5-C6 and C7-T1. 4. Cervical spondylosis as described. 5. Facet ankylosis on the right at C2-C3. Electronically Signed   By: Jackey Loge D.O.   On: 11/10/2022 19:48   DG Chest 1 View  Result Date: 11/10/2022 CLINICAL DATA:  Weakness EXAM: PORTABLE CHEST 1 VIEW COMPARISON:  08/25/2022 FINDINGS: Cardiac shadow is stable. Tortuous thoracic aorta is noted. The lungs are well aerated bilaterally. Stable scarring is noted in the midportion of both lungs. No new focal infiltrate is seen. IMPRESSION: No acute abnormality noted. Electronically Signed   By: Alcide Clever M.D.   On:  11/10/2022 19:48    Pending Labs Unresulted Labs (From admission, onward)     Start     Ordered   11/11/22 0500  Basic metabolic panel  Tomorrow morning,   R        11/10/22 2047   11/11/22 0500  CBC  Tomorrow morning,   R        11/10/22 2047   11/11/22 0500  Protime-INR  Tomorrow morning,   R        11/10/22 2211            Vitals/Pain Today's Vitals   11/10/22 1900 11/10/22 2221 11/10/22 2253 11/11/22 0130  BP: (!) 158/66 (!) 142/60 (!) 146/70 (!) 153/72  Pulse:  64 67 61  Resp: (!) 22 15  13   Temp:  98 F (36.7 C)  98 F (36.7 C)  TempSrc:  Oral  Oral  SpO2:  100%  100%  Weight:      Height:        Isolation Precautions No active isolations  Medications Medications  acetaminophen (TYLENOL) tablet 650 mg (has no administration in time range)    Or  acetaminophen (TYLENOL) suppository 650 mg (has no administration in time range)  diltiazem (CARDIZEM SR) 12 hr capsule 60 mg (60 mg Oral Given 11/10/22 2253)  guanFACINE (TENEX) tablet 2 mg (2 mg Oral Given 11/10/22 2254)  metoprolol tartrate (LOPRESSOR) tablet 25 mg (25 mg Oral Given 11/10/22 2253)  lisinopril (ZESTRIL) tablet 5 mg (has no administration in time range)  pantoprazole (PROTONIX) EC tablet 40 mg (has no  administration in time range)  rosuvastatin (CRESTOR) tablet 10 mg (has no administration in time range)  sertraline (ZOLOFT) tablet 75 mg (75 mg Oral Given 11/10/22 2253)  senna-docusate (Senokot-S) tablet 1 tablet (has no administration in time range)  cefTRIAXone (ROCEPHIN) 1 g in sodium chloride 0.9 % 100 mL IVPB (0 g Intravenous Stopped 11/10/22 2102)    Mobility non-ambulatory     Focused Assessments Neuro Assessment Handoff:  Swallow screen pass? Yes  Cardiac Rhythm: Normal sinus rhythm      R Recommendations: See Admitting Provider Note  Report given to:   Additional Notes:

## 2022-11-11 NOTE — Discharge Instructions (Signed)
Orthopedic Discharge Instructions  Diet: As you were doing prior to hospitalization   Shower:  May shower but keep the wounds dry, use an occlusive plastic wrap, NO SOAKING IN TUB.  If the bandage gets wet, change with a clean dry gauze.  If you have a splint on, leave the splint in place and keep the splint dry with a plastic bag.  Dressing:  You may change your dressing 3-5 days after surgery.  If the dressing remains clean and dry it can also be left on until follow up.  If you change the dressing replace with clean gauze and tape or ace wrap.  For most surgeries, the stitches used are dissolvable and don't need to be removed.  However, depending on your surgery, you may have stitches that will need to be removed in the office in about 2 weeks.    Activity:  Increase activity slowly as tolerated, but follow the weight bearing instructions below.  Do not drive for the next 4-6 weeks.  In addition, you cannot be taking narcotics while you drive, and you must feel in control of the vehicle.    Weight Bearing:   You may bear weight on the leg as tolerated.  Follow any precautions that physical therapy may give you.    Blood clot prevention (DVT Prophylaxis): After surgery you are at an increased risk for a blood clot. You may resume your regular blood thinner, Jantoven (Warfarin), to be taken as you normally do for 4 total weeks from surgery to help reduce your risk of getting a blood clot. At the end of the 4 weeks, then you may return to taking it as instructed by your regular prescriber.  Signs of a pulmonary embolus (blood clot in the lungs) include sudden short of breath, feeling lightheaded or dizzy, chest pain with a deep breath, rapid pulse rapid breathing.  Signs of a blood clot in your arms or legs include new unexplained swelling and cramping, warm, red or darkened skin around the painful area.  Please call the office or 911 right away if these signs or symptoms develop.  To prevent  constipation: you may use a stool softener such as - Colace (over the counter) 100 mg by mouth twice a day  Drink plenty of fluids (prune juice may be helpful) and high fiber foods Miralax (over the counter) for constipation as needed.    Itching:  If you experience itching with your medications, try taking only a single pain pill, or even half a pain pill at a time.  You may take up to 10 pain pills per day, and you can also use benadryl over the counter for itching or also to help with sleep.   Precautions:  If you experience chest pain or shortness of breath - call 911 immediately for transfer to the hospital emergency department!!  Call office [phone]) for the following: Temperature greater than 101F Persistent nausea and vomiting Severe uncontrolled pain Redness, tenderness, or signs of infection (pain, swelling, redness, odor or green/yellow discharge around the site) Difficulty breathing, headache or visual disturbances Hives Persistent dizziness or light-headedness Extreme fatigue Any other questions or concerns you may have after discharge  In an emergency, call 911 or go to an Emergency Department at a nearby hospital  Follow- Up Appointment:  Please call for an appointment to be seen approximately 2-3 week after surgery in [facility] with your surgeon Dr. Weber Cooks - [phone] Address: [address], [place], [place] [address]

## 2022-11-11 NOTE — Hospital Course (Signed)
Internal Medicine Attending:   I was physically present during the key portions of the resident provided service and participated in medical decision making of the patient's management care in the assessment and plan. Please also see my H&P attestation My great appreciation to surgical services.

## 2022-11-11 NOTE — Interval H&P Note (Signed)
The patient has been re-examined, and the chart reviewed, and there have been no interval changes to the documented history and physical.    Plan for left intertroch femur fracture ORIF with intramedullary nail.  The operative side was examined and the patient and her husband was confirmed to have sensation to DPN, SPN, TN intact, Motor EHL, ext, flex 5/5, and DP 2+, PT 2+, No significant edema.   The risks, benefits, and alternatives have been discussed at length with patient and the patient's husband and they are willing to proceed.  Left hip  marked. Consent has been signed.

## 2022-11-11 NOTE — Anesthesia Procedure Notes (Signed)
Procedure Name: Intubation Date/Time: 11/11/2022 1:16 PM  Performed by: Vanessa Hooper, CRNAPre-anesthesia Checklist: Patient identified, Emergency Drugs available, Suction available and Patient being monitored Patient Re-evaluated:Patient Re-evaluated prior to induction Oxygen Delivery Method: Circle system utilized Preoxygenation: Pre-oxygenation with 100% oxygen Induction Type: IV induction Ventilation: Mask ventilation without difficulty Laryngoscope Size: 2 and Miller Grade View: Grade I Tube type: Oral Tube size: 7.0 mm Number of attempts: 1 Airway Equipment and Method: Stylet Placement Confirmation: ETT inserted through vocal cords under direct vision, positive ETCO2 and breath sounds checked- equal and bilateral Secured at: 20 cm Tube secured with: Tape Dental Injury: Teeth and Oropharynx as per pre-operative assessment

## 2022-11-11 NOTE — Progress Notes (Signed)
PT Cancellation Note  Patient Details Name: Brandi [name] MRN: [id number] DOB: 06-23-1940   Cancelled Treatment:    Reason Eval/Treat Not Completed: Medical issues which prohibited therapy. Pt with L intertrochanteric femur fx, pending surgery per ortho notes. PT will hold until surgery is complete and the pt is appropriate to mobilize.   Arlyss Gandy 11/11/2022, 7:50 AM

## 2022-11-11 NOTE — Progress Notes (Addendum)
HD#1 Subjective:   Summary: This is a 82 year old female with a history of A-fib/flutter on Coumadin, CKD stage IIIb, hypertension, hyperlipidemia who presented due to a fall and found to have Left intratrochanteric fracture.  Overnight Events: Admitted   Patient seen at bedside today.  Was laying in bed comfortable. No acute distress.  There are concerns for INR of 2.2 and  surgery wont do a left IT hip fracture  fix unless patient's INR falls below 1.4.  Pharmacy was consulted and they recommended giving the patient 0.5 to 1 mg of vitamin K and reassess patient's INR 6 hours after. Also, Freeman Caldron PA of Orthopedics confirmed Patient will be transferred to [facility] for the pending surgery.Question is if patient would be transferred back to our service of not post-op. We are always available to resume care of the patient if needed after surgery. Other chronic conditions discussed below and per H&P.  Objective:  Vital signs in last 24 hours: Vitals:   11/11/22 0230 11/11/22 0305 11/11/22 0829 11/11/22 1129  BP: 139/69 (!) 163/60 (!) 171/70 (!) 190/83  Pulse: (!) 59 (!) 59 65 73  Resp: (!) 22 18  16   Temp:  97.9 F (36.6 C)  97.8 F (36.6 C)  TempSrc:  Oral  Oral  SpO2: 97% 95% 95% 95%  Weight:    65.3 kg  Height:    4\' 11"  (1.499 m)   Supplemental O2: Room Air SpO2: 95 %   Physical Exam   Constitutional: Laying in bed in no acute distress  Cardiovascular: regular rate and rhythm, no m/r/g Pulmonary/Chest: normal work of breathing on room air, lungs clear to auscultation bilaterally Abdominal: soft, non-tender, non-distended Neurological: Oriented to self but not to place  Skin: warm and dry Psych: normal mood   Filed Weights   11/10/22 1549 11/11/22 1129  Weight: 65.3 kg 65.3 kg     Intake/Output Summary (Last 24 hours) at 11/11/2022 1352 Last data filed at 11/11/2022 1322 Gross per 24 hour  Intake 300 ml  Output --  Net 300 ml   Net IO Since  Admission: 300 mL [11/11/22 1352]  Pertinent Labs:    Latest Ref Rng & Units 11/11/2022    5:44 AM 11/10/2022    4:29 PM 08/29/2022    7:22 AM  CBC  WBC 4.0 - 10.5 K/uL 12.5  13.8  10.2   Hemoglobin 12.0 - 15.0 g/dL 7.8  8.6  16.1   Hematocrit 36.0 - 46.0 % 25.2  28.3  37.3   Platelets 150 - 400 K/uL 191  201  236        Latest Ref Rng & Units 11/11/2022    5:44 AM 11/10/2022    4:29 PM 08/30/2022   12:38 PM  CMP  Glucose 70 - 99 mg/dL 096  045  409   BUN 8 - 23 mg/dL 19  20  28    Creatinine 0.44 - 1.00 mg/dL 8.11  9.14  7.82   Sodium 135 - 145 mmol/L 139  140  135   Potassium 3.5 - 5.1 mmol/L 4.1  3.9  3.6   Chloride 98 - 111 mmol/L 105  108  97   CO2 22 - 32 mmol/L 23  23  24    Calcium 8.9 - 10.3 mg/dL 8.8  8.7  9.0   Total Protein 6.5 - 8.1 g/dL  6.1    Total Bilirubin 0.3 - 1.2 mg/dL  0.6    Alkaline Phos 38 -  126 U/L  83    AST 15 - 41 U/L  22    ALT 0 - 44 U/L  19      Imaging: DG FEMUR MIN 2 VIEWS LEFT  Result Date: 11/11/2022 CLINICAL DATA:  Intertrochanteric femur fracture. EXAM: LEFT FEMUR 2 VIEWS COMPARISON:  Hip radiograph earlier today. FINDINGS: Unchanged alignment of intertrochanteric femur fracture with displacement of the lesser and greater trochanters. The distal femur is intact. Prior knee arthroplasty. Vascular calcifications are seen. IMPRESSION: Unchanged alignment of intertrochanteric femur fracture. Electronically Signed   By: Narda Rutherford M.D.   On: 11/11/2022 00:02   DG Hip Unilat With Pelvis 2-3 Views Left  Result Date: 11/10/2022 CLINICAL DATA:  Recent fall with hip pain, initial encounter EXAM: DG HIP (WITH OR WITHOUT PELVIS) 3V LEFT COMPARISON:  08/25/2022 FINDINGS: Intratrochanteric left femoral fracture is noted with impaction and angulation at the fracture site. The femoral head is well seated. Pelvic ring is intact. Old healed superior and inferior pubic rami fractures on the right are noted. IMPRESSION: Left intratrochanteric femoral  fracture. 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 Electronically Signed   By: Alcide Clever M.D.   On: 11/10/2022 19:49   CT Head Wo Contrast  Result Date: 11/10/2022 CLINICAL DATA:  Ataxia, head trauma. Polytrauma, blunt. Additional history obtained: Lethargy. Fatigue. EXAM: CT HEAD WITHOUT CONTRAST CT CERVICAL SPINE WITHOUT CONTRAST TECHNIQUE: Multidetector CT imaging of the head and cervical spine was performed following the standard protocol without intravenous contrast. Multiplanar CT image reconstructions of the cervical spine were also generated. RADIATION DOSE REDUCTION: This exam was performed according to the departmental dose-optimization program which includes automated exposure control, adjustment of the mA and/or kV according to patient size and/or use of iterative reconstruction technique. COMPARISON:  Head CT 08/25/2022.  Cervical spine CT 08/25/2022. FINDINGS: CT HEAD FINDINGS Brain: Prominence of the ventricles and sulci which appears commensurate with the degree of cerebral volume loss. Patchy and ill-defined hypoattenuation within the cerebral white matter, nonspecific but compatible with moderate chronic small vessel ischemic disease. There is no acute intracranial hemorrhage. No demarcated cortical infarct. No extra-axial fluid collection. No evidence of an intracranial mass. No midline shift. Vascular: No hyperdense vessel.  Atherosclerotic calcifications Skull: No calvarial fracture or aggressive osseous lesion. Sinuses/Orbits: No mass or acute finding within the imaged orbits. Complete opacification of the right maxillary sinus at the imaged levels (with superimposed foci mineralization within the sinus). Partial opacification of anterior right ethmoid air cells, overall mild-to-moderate in severity. CT CERVICAL SPINE FINDINGS Alignment: Nonspecific reversal of the expected cervical lordosis. 3 mm C3-C4 grade 1 anterolisthesis. 2 mm C4-C5 grade 1 anterolisthesis. Slight C5-C6  grade 1 retrolisthesis. 2-3 mm C7-T1 grade 1 anterolisthesis. Skull base and vertebrae: The basion-dental and atlanto-dental intervals are maintained.No evidence of acute fracture to the cervical spine. Facet ankylosis on the right at C2-C3. Multilevel ventral osteophytes. Soft tissues and spinal canal: No prevertebral fluid or swelling. No visible canal hematoma. Disc levels: Cervical spondylosis with multilevel disc space narrowing, disc bulges/central disc protrusions, posterior disc osteophyte complexes, endplate spurring, uncovertebral hypertrophy and facet arthrosis. Disc space narrowing is greatest at C5-C6 and C6-C7 (advanced at these levels). Multilevel spinal canal narrowing. Most notably, a posterior disc osteophyte complex contributes to spinal canal stenosis at C5-C6 which is suspected moderate in severity. Multilevel bony neural foraminal narrowing. Degenerative changes also present at the C1-C2 articulation. Upper chest: No consolidation within the imaged lung apices. No visible pneumothorax. Multiple thyroid nodules measuring  up to 12 mm, not meeting consensus criteria for ultrasound follow-up based on size. No follow-up imaging recommended. Reference: J Am Coll Radiol. 2015 Feb;12(2): 143-50. IMPRESSION: CT head: 1.  No evidence of an acute intracranial abnormality. 2. Parenchymal atrophy and chronic small vessel ischemic disease. 3. Paranasal sinus disease at the imaged levels, as described. CT cervical spine: 1. No evidence of an acute cervical spine fracture. 2. Nonspecific reversal of the expected cervical lordosis. 3. Grade 1 spondylolisthesis at C3-C4, C4-C5, C5-C6 and C7-T1. 4. Cervical spondylosis as described. 5. Facet ankylosis on the right at C2-C3. Electronically Signed   By: Jackey Loge D.O.   On: 11/10/2022 19:48   CT Cervical Spine Wo Contrast  Result Date: 11/10/2022 CLINICAL DATA:  Ataxia, head trauma. Polytrauma, blunt. Additional history obtained: Lethargy. Fatigue. EXAM: CT  HEAD WITHOUT CONTRAST CT CERVICAL SPINE WITHOUT CONTRAST TECHNIQUE: Multidetector CT imaging of the head and cervical spine was performed following the standard protocol without intravenous contrast. Multiplanar CT image reconstructions of the cervical spine were also generated. RADIATION DOSE REDUCTION: This exam was performed according to the departmental dose-optimization program which includes automated exposure control, adjustment of the mA and/or kV according to patient size and/or use of iterative reconstruction technique. COMPARISON:  Head CT 08/25/2022.  Cervical spine CT 08/25/2022. FINDINGS: CT HEAD FINDINGS Brain: Prominence of the ventricles and sulci which appears commensurate with the degree of cerebral volume loss. Patchy and ill-defined hypoattenuation within the cerebral white matter, nonspecific but compatible with moderate chronic small vessel ischemic disease. There is no acute intracranial hemorrhage. No demarcated cortical infarct. No extra-axial fluid collection. No evidence of an intracranial mass. No midline shift. Vascular: No hyperdense vessel.  Atherosclerotic calcifications Skull: No calvarial fracture or aggressive osseous lesion. Sinuses/Orbits: No mass or acute finding within the imaged orbits. Complete opacification of the right maxillary sinus at the imaged levels (with superimposed foci mineralization within the sinus). Partial opacification of anterior right ethmoid air cells, overall mild-to-moderate in severity. CT CERVICAL SPINE FINDINGS Alignment: Nonspecific reversal of the expected cervical lordosis. 3 mm C3-C4 grade 1 anterolisthesis. 2 mm C4-C5 grade 1 anterolisthesis. Slight C5-C6 grade 1 retrolisthesis. 2-3 mm C7-T1 grade 1 anterolisthesis. Skull base and vertebrae: The basion-dental and atlanto-dental intervals are maintained.No evidence of acute fracture to the cervical spine. Facet ankylosis on the right at C2-C3. Multilevel ventral osteophytes. Soft tissues and  spinal canal: No prevertebral fluid or swelling. No visible canal hematoma. Disc levels: Cervical spondylosis with multilevel disc space narrowing, disc bulges/central disc protrusions, posterior disc osteophyte complexes, endplate spurring, uncovertebral hypertrophy and facet arthrosis. Disc space narrowing is greatest at C5-C6 and C6-C7 (advanced at these levels). Multilevel spinal canal narrowing. Most notably, a posterior disc osteophyte complex contributes to spinal canal stenosis at C5-C6 which is suspected moderate in severity. Multilevel bony neural foraminal narrowing. Degenerative changes also present at the C1-C2 articulation. Upper chest: No consolidation within the imaged lung apices. No visible pneumothorax. Multiple thyroid nodules measuring up to 12 mm, not meeting consensus criteria for ultrasound follow-up based on size. No follow-up imaging recommended. Reference: J Am Coll Radiol. 2015 Feb;12(2): 143-50. IMPRESSION: CT head: 1.  No evidence of an acute intracranial abnormality. 2. Parenchymal atrophy and chronic small vessel ischemic disease. 3. Paranasal sinus disease at the imaged levels, as described. CT cervical spine: 1. No evidence of an acute cervical spine fracture. 2. Nonspecific reversal of the expected cervical lordosis. 3. Grade 1 spondylolisthesis at C3-C4, C4-C5, C5-C6 and C7-T1. 4. Cervical spondylosis  as described. 5. Facet ankylosis on the right at C2-C3. Electronically Signed   By: Jackey Loge D.O.   On: 11/10/2022 19:48   DG Chest 1 View  Result Date: 11/10/2022 CLINICAL DATA:  Weakness EXAM: PORTABLE CHEST 1 VIEW COMPARISON:  08/25/2022 FINDINGS: Cardiac shadow is stable. Tortuous thoracic aorta is noted. The lungs are well aerated bilaterally. Stable scarring is noted in the midportion of both lungs. No new focal infiltrate is seen. IMPRESSION: No acute abnormality noted. Electronically Signed   By: Alcide Clever M.D.   On: 11/10/2022 19:48    Assessment/Plan:    Principal Problem:   Closed intertrochanteric fracture of left femur, initial encounter (HCC) Active Problems:   Falls frequently   Asymptomatic bacteriuria   Patient Summary: Brandi [name] is a 82 y.o. with a pertinent PMH of paroxysmal afib/flutter on coumadin, CKD IIIb, HTN, and HLD , who presented with a fall and admitted for left intertrochanteric femur fracture   #Left intratrochanteric fracture History of right pubic rami fractures Pt is scheduled for surgery.  Plan is to transfer the patient to [facility] for left IT hip fracture this fix today. Ortho will not operate until INR is below 1.4. - F/U on surgery plan - Vitamin K dosing per Pharmacy - Dr Alexandria Lodge recs appreciated  - Pending transfer to [facility]  Acute on chronic anemia #INR of 2.2 Hb 8.6 on admission, down from baseline of 10-11. Acute anemia could be secondary to blood loss in the setting of her fall/fracture while on coumadin.There are concerns for INR of 2.2 and  surgery wont do a left IT hip fracture this fix unless patient's INR falls below 1.4.  Pharmacy was been consulted and they recommended giving the patient 0.5 to 1 mg of vitamin K and reassess patient's INR 6 hours after - Monitor CBC - Hold Coumadin    Physical deconditioning Patient has had an overall decline in her health, mental status, and physical ability since she was hospitalized one year ago with pneumonia. Most recently has had a decline since she fell in August and had been at a SNF for ~ 2 months. Suspect her most recent fall is in the setting of her physical deconditioning.  - PT/OT  - TOC consult, suspect she will need SNF placement again   Asymptomatic bacteruria The patient has not complained of any fevers, chills, abdominal/suprapubic pain, dysuria, hematuria, or any other urinary sxs. UA was positive for nitrites and had many bacteria, but this could be asymptomatic bacteruria and not a true UTI. She received one dose of  ceftriaxone in the ED, but will hold off re-ordering this. If she develops systemic signs of infection or endorses urinary sxs, will restart ceftriaxone. - Monitor for s/s of UTI - Daily CBC   Paroxysmal Afib Hypertension Patient is on coumadin, diltiazem 60 mg bid, metoprolol 25 mg bid, guanfacine 2 mg at bedtime, and lisinopril 5 mg daily. Holding coumadin given possibility of surgery but will resume all other medications. Last dose of coumadin the evening of 10/21 and INR today was 1.9.  - Daily INR   CKD IIIb  Cr 1.34, GFR 40, consistent with CKD IIIb.  - Daily BMP   Hyperlipidemia On crestor 10 mg at bedtime only on Wednesdays and Sundays. Resumed.    Depression Resumed home sertraline 75 mg daily.    Diet: NPO IVF: None,None VTE: None Code:  DNR limited    Dispo: Anticipated discharge to  [facility] for Surgery  Kathleen Lime, MD Internal Medicine Resident PGY-1 Please contact the on call pager after 5 pm and on weekends at [phone]

## 2022-11-11 NOTE — Op Note (Signed)
DATE OF SURGERY:  11/11/2022  TIME: 3:03 PM  PATIENT NAME:  Brandi [name]  AGE: 82 y.o.  PRE-OPERATIVE DIAGNOSIS:  LEFT FRACTURED HIP  POST-OPERATIVE DIAGNOSIS:  SAME  PROCEDURE:  INTRAMEDULLARY (IM) NAIL INTERTROCHANTERIC  SURGEON:  Dublin Cantero A Cecilia Vancleve  ASSISTANT:  Kathie Dike, PA-C was present and scrubbed throughout the case, critical for assistance with exposure, retraction, instrumentation, and closure.  OPERATIVE IMPLANTS:  [employer] Intertan Nail 10 x 180 mm , 95/90 lag compression screw, 1 distal interlock  Implant Name Type Inv. Item Serial No. Manufacturer Lot No. LRB No. Used Action  NAIL LOCK CANN 10X360 130D LT - [id number] Nail NAIL LOCK CANN 10X360 130D LT  [employer] [id number] Left 1 Implanted  SCREW LAG COMPR KIT 95/90 - [id number] Screw SCREW LAG COMPR KIT 95/90  [employer] [id number] Left 1 Implanted  SCREW TRIGEN LOW PROF 5.0X32.5 - [id number] Screw SCREW TRIGEN LOW PROF 5.0X32.5  [employer] [id number] Left 1 Implanted    ESTIMATED BLOOD LOSS: 250cc  PREOPERATIVE INDICATIONS:  Brandi [name] is a 82 y.o. year old who fell and suffered an LEFT FRACTURED HIP. She was brought into the ER and then admitted and optimized and then elected for surgical intervention.    The risks benefits and alternatives were discussed with the patient including but not limited to the risks of nonoperative treatment, versus surgical intervention including infection, bleeding, nerve injury, malunion, nonunion, hardware prominence, hardware failure, need for hardware removal, blood clots, cardiopulmonary complications, morbidity, mortality, among others, and they were willing to proceed.    OPERATIVE PROCEDURE:  The patient was brought to the operating room and placed in the supine position. Anesthesia was administered. She was placed on the fracture table.  Closed reduction was performed under C-arm guidance.  Time  out was then performed after sterile prep and drape. She received preoperative antibiotics.  Small incision proximal to the greater trochanter was made and carried down through skin and subcutaneous tissue.  Threaded guidewire was directed at the tip of the greater trochanter and advanced into the proximal metaphysis.  Positioning was confirmed with fluoroscopy.  I then used an entry reamer to enter the medullary canal. I then measured the length of the nail to be just distal to the anteior flange of her knee replacement implant. I then passed a 10 x 360 mm InterTAN down the center of the canal attached to the targeting arm.  I then used the targeting arm to make a percutaneous incision and directed a threaded guidewire up into the head/neck segment.  I confirmed adequate tip apex distance and measured the length.  I decided to place a 95 mm screw.  I then drilled the path for the compression screw and placed an antirotation bar.  I then placed the lag screw and then placed the compression screw and compressed approximately 10 mm.  The proximal portion of the nail was statically locked.   I used the targeting arm to place a distal interlocking screw.  The targeting arm was removed.  Final fluoroscopic imaging was obtained.    The wounds were irrigated copiously, and vancomycin powder was placed in the wounds.  The gluteal fascia was closed with 0 Vicryl, and skin was closed with 2-0 Vicryl and 3-0 Monocryl.  Sterile dressing was applied with Dermabond, 4 x 4 gauze, and Tegaderm.  The patient was awakened and returned to PACU in stable and satisfactory condition. There were no complications and  the patient tolerated the procedure well.   Post op recs: WB: WBAT LLE Abx: ancef x23 hours post op Imaging: PACU xrays Dressing: keep intact until follow up, change PRN if soiled or saturated. DVT prophylaxis: resume coumadin post op. Follow up: 2 weeks after surgery for a wound check with Dr. Blanchie Dessert at  [facility].  Address: [address], [place], [place] [address]  Office Phone: [phone]  Weber Cooks, MD Orthopaedic Surgery

## 2022-11-11 NOTE — Anesthesia Preprocedure Evaluation (Addendum)
Anesthesia Evaluation  Patient identified by MRN, date of birth, ID band Patient awake    Reviewed: Allergy & Precautions, H&P , NPO status , Patient's Chart, lab work & pertinent test results, reviewed documented beta blocker date and time   Airway Mallampati: III  TM Distance: >3 FB Neck ROM: Full    Dental no notable dental hx. (+) Teeth Intact, Dental Advisory Given   Pulmonary neg pulmonary ROS   Pulmonary exam normal breath sounds clear to auscultation       Cardiovascular hypertension, Pt. on medications and Pt. on home beta blockers  Rhythm:Regular Rate:Normal     Neuro/Psych    Depression    negative neurological ROS     GI/Hepatic negative GI ROS, Neg liver ROS,,,  Endo/Other  negative endocrine ROS    Renal/GU Renal disease  negative genitourinary   Musculoskeletal   Abdominal   Peds  Hematology  (+) Blood dyscrasia, anemia   Anesthesia Other Findings   Reproductive/Obstetrics negative OB ROS                             Anesthesia Physical Anesthesia Plan  ASA: 3  Anesthesia Plan: General   Post-op Pain Management: Ofirmev IV (intra-op)*   Induction: Intravenous  PONV Risk Score and Plan: 4 or greater and Ondansetron and Dexamethasone  Airway Management Planned: Oral ETT  Additional Equipment:   Intra-op Plan:   Post-operative Plan: Extubation in OR  Informed Consent: I have reviewed the patients History and Physical, chart, labs and discussed the procedure including the risks, benefits and alternatives for the proposed anesthesia with the patient or authorized representative who has indicated his/her understanding and acceptance.   Patient has DNR.  Discussed DNR with power of attorney and Continue DNR.   Dental advisory given  Plan Discussed with: CRNA  Anesthesia Plan Comments:        Anesthesia Quick Evaluation

## 2022-11-12 ENCOUNTER — Other Ambulatory Visit: Payer: Self-pay

## 2022-11-12 ENCOUNTER — Encounter (HOSPITAL_COMMUNITY): Payer: Self-pay | Admitting: Orthopedic Surgery

## 2022-11-12 DIAGNOSIS — S72002A Fracture of unspecified part of neck of left femur, initial encounter for closed fracture: Secondary | ICD-10-CM

## 2022-11-12 DIAGNOSIS — Z515 Encounter for palliative care: Secondary | ICD-10-CM | POA: Diagnosis not present

## 2022-11-12 DIAGNOSIS — Z7189 Other specified counseling: Secondary | ICD-10-CM

## 2022-11-12 DIAGNOSIS — Z66 Do not resuscitate: Secondary | ICD-10-CM

## 2022-11-12 DIAGNOSIS — S72142A Displaced intertrochanteric fracture of left femur, initial encounter for closed fracture: Principal | ICD-10-CM

## 2022-11-12 DIAGNOSIS — R296 Repeated falls: Secondary | ICD-10-CM

## 2022-11-12 LAB — CBC
HCT: 25.4 % — ABNORMAL LOW (ref 36.0–46.0)
Hemoglobin: 7.8 g/dL — ABNORMAL LOW (ref 12.0–15.0)
MCH: 27.8 pg (ref 26.0–34.0)
MCHC: 30.7 g/dL (ref 30.0–36.0)
MCV: 90.4 fL (ref 80.0–100.0)
Platelets: 145 10*3/uL — ABNORMAL LOW (ref 150–400)
RBC: 2.81 MIL/uL — ABNORMAL LOW (ref 3.87–5.11)
RDW: 19.2 % — ABNORMAL HIGH (ref 11.5–15.5)
WBC: 11.7 10*3/uL — ABNORMAL HIGH (ref 4.0–10.5)
nRBC: 0 % (ref 0.0–0.2)

## 2022-11-12 LAB — COMPREHENSIVE METABOLIC PANEL
ALT: 14 U/L (ref 0–44)
AST: 19 U/L (ref 15–41)
Albumin: 2.5 g/dL — ABNORMAL LOW (ref 3.5–5.0)
Alkaline Phosphatase: 70 U/L (ref 38–126)
Anion gap: 10 (ref 5–15)
BUN: 29 mg/dL — ABNORMAL HIGH (ref 8–23)
CO2: 22 mmol/L (ref 22–32)
Calcium: 7.9 mg/dL — ABNORMAL LOW (ref 8.9–10.3)
Chloride: 103 mmol/L (ref 98–111)
Creatinine, Ser: 1.6 mg/dL — ABNORMAL HIGH (ref 0.44–1.00)
GFR, Estimated: 32 mL/min — ABNORMAL LOW (ref >60)
Glucose, Bld: 188 mg/dL — ABNORMAL HIGH (ref 70–99)
Potassium: 4.1 mmol/L (ref 3.5–5.1)
Sodium: 135 mmol/L (ref 135–145)
Total Bilirubin: 0.4 mg/dL (ref 0.3–1.2)
Total Protein: 5.3 g/dL — ABNORMAL LOW (ref 6.5–8.1)

## 2022-11-12 LAB — PROTIME-INR
INR: 3.1 — ABNORMAL HIGH (ref 0.8–1.2)
Prothrombin Time: 32.3 s — ABNORMAL HIGH (ref 11.4–15.2)

## 2022-11-12 MED ORDER — OXYCODONE HCL 5 MG PO TABS: 2.5000 mg | ORAL_TABLET | Freq: Four times a day (QID) | ORAL | 0 refills | Status: AC | PRN

## 2022-11-12 MED ORDER — METOPROLOL SUCCINATE ER 25 MG PO TB24
25.0000 mg | ORAL_TABLET | Freq: Two times a day (BID) | ORAL | Status: DC
  Administered 2022-11-13 – 2022-11-16 (×7): 25 mg via ORAL
  Filled 2022-11-12 (×8): qty 1

## 2022-11-12 NOTE — Progress Notes (Signed)
     1 Day Post-Op Procedure(s) (LRB): INTRAMEDULLARY (IM) NAIL INTERTROCHANTERIC (Left) Subjective:  POD1 left hip IMN.  Transfused 2 units during surgery.  On coumadin at baseline with elevated PT-INR.  Hx of dementia, per nursing staff and family member reports, patient cognition close to baseline.  Had issues urinating yesterday, I&O resulted 1500cc.    Patient reports pain as "okay".  Slept okay.  Nods or shakes her head, or one word answers for communication.  Denies chest pain, ShOB, N/V.  Denies numbness or tingling.  No other complaints at this time.  Objective:   VITALS:   Vitals:   11/11/22 2247 11/12/22 0200 11/12/22 0519 11/12/22 0952  BP: (!) 156/102 120/74 (!) 116/95 (!) 147/74  Pulse: 73 61 60 63  Resp: 16 16 16 16   Temp: 97.6 F (36.4 C) (!) 97.5 F (36.4 C) 98.6 F (37 C) 98.3 F (36.8 C)  TempSrc: Axillary Oral Oral   SpO2: 95% 97% 100% 99%  Weight:      Height:        Alert and oriented 1-2 at baseline Unable to fully participate with assessment due to cognition status Intact pulses distally Dorsiflexion/Plantar flexion intact Incision: dressing C/D/I Compartment soft Wiggles toes spontaneously and appropriately   Lab Results  Component Value Date   WBC 11.7 (H) 11/12/2022   HGB 7.8 (L) 11/12/2022   HCT 25.4 (L) 11/12/2022   MCV 90.4 11/12/2022   PLT 145 (L) 11/12/2022   BMET    Component Value Date/Time   NA 135 11/12/2022 0347   K 4.1 11/12/2022 0347   CL 103 11/12/2022 0347   CO2 22 11/12/2022 0347   GLUCOSE 188 (H) 11/12/2022 0347   BUN 29 (H) 11/12/2022 0347   BUN 12 12/11/2011 0000   CREATININE 1.60 (H) 11/12/2022 0347   CALCIUM 7.9 (L) 11/12/2022 0347   GFRNONAA 32 (L) 11/12/2022 0347     Xray: stable post-operative imaging  Assessment/Plan: 1 Day Post-Op   Principal Problem:   Closed intertrochanteric fracture of left femur, initial encounter (HCC) Active Problems:   Falls frequently   Asymptomatic  bacteriuria  Narrative: 10/24: Dementia close to baseline per staff and reports from family members.  To mobilize with PT today.  PT-INR elevated, plan to hold Lovenox/Coumadin for now, goal to restart once within appropriate range.  Hgb 7.8, stable compared to yesterday, continue to monitor and consider transfusion as needed.  Anticipate SNF upon DC.  Post op recs: WB: WBAT LLE Abx: ancef x23 hours post op Imaging: PACU xrays Dressing: keep intact until follow up, change PRN if soiled or saturated. DVT prophylaxis: resume coumadin post op. Follow up: 2 weeks after surgery for a wound check with Dr. Blanchie Dessert at [facility].  Address: [address], [place], [place] [address]  Office Phone: [phone]   Cecil Cobbs 11/12/2022, 10:45 AM   Contact information:   Weekdays 7am-5pm epic message Dr. Blanchie Dessert, or call office for patient follow up: [phone] After hours and holidays please check [email or web address] for group call information for [facility]

## 2022-11-12 NOTE — Progress Notes (Signed)
The patient is unable to urinate. Messaged Anthoney Harada to request an I&O catheter.

## 2022-11-12 NOTE — TOC Initial Note (Signed)
Transition of Care [facility]) - Initial/Assessment Note    Patient Details  Name: Brandi [name] MRN: [id number] Date of Birth: 05/02/1940  Transition of Care [facility]) CM/SW Contact:    Amada Jupiter, LCSW Phone Number: 11/12/2022, 3:10 PM  Clinical Narrative:                  Met with pt in attempt to introduce self, however, pt simply looks at me but does not engage in any way.  I did ask her if I could contact her spouse and she nodded "yes".  Contacted spouse to discuss anticipated dc needs.  He confirms that pt had just returned home from [facility] SNF ~ 2 weeks ago.  Reports she was mostly functioning at a wheelchair level but could occasionally take a few steps with RW.  He was assisting her with all transfers which he describes as a stand -pivot.  Reports she sleeps "most of the day" and very lethargic.   Discussed with spouse that, on PT eval, she has required 2+ assist which is more than she required in the home.  SNF has been the recommendation again from therapy and we could try to secure this, however, she is likely in her co-pay days and there would be some out of pocket costs.  Securing insurance auth for SNF again may, also, be a challenge.  At this point, spouse requests that [facility] attempt SNF placement.  Will begin bed search, etc and keep spouse updated.   Barriers to Discharge: Continued Medical Work up, English as a second language teacher, SNF Pending bed offer   Patient Goals and CMS Choice Patient states their goals for this hospitalization and ongoing recovery are:: return to rehab   Choice offered to / list presented to : Spouse      Expected Discharge Plan and Services In-house Referral: Clinical Social Work   Post Acute Care Choice: Skilled Nursing Facility Living arrangements for the past 2 months: Skilled Holiday representative (was in rehab at [facility] until ~ 2 weeks prior to this admission)                                      Prior Living  Arrangements/Services Living arrangements for the past 2 months: Skilled Holiday representative (was in rehab at [facility] until ~ 2 weeks prior to this admission)   Patient language and need for interpreter reviewed:: Yes Do you feel safe going back to the place where you live?: Yes      Need for Family Participation in Patient Care: Yes (Comment) Care giver support system in place?: Yes (comment)   Criminal Activity/Legal Involvement Pertinent to Current Situation/Hospitalization: No - Comment as needed  Activities of Daily Living   ADL Screening (condition at time of admission) Independently performs ADLs?: No Does the patient have a NEW difficulty with bathing/dressing/toileting/self-feeding that is expected to last >3 days?: No Does the patient have a NEW difficulty with getting in/out of bed, walking, or climbing stairs that is expected to last >3 days?: No Does the patient have a NEW difficulty with communication that is expected to last >3 days?: No Is the patient deaf or have difficulty hearing?: No Does the patient have difficulty seeing, even when wearing glasses/contacts?: No Does the patient have difficulty concentrating, remembering, or making decisions?: Yes  Permission Sought/Granted Permission sought to share information with : Family Supports Permission granted to share information with : Yes, Verbal  Permission Granted  Share Information with NAME: Kiyra Faurot, spouse, @ [phone]           Emotional Assessment Appearance:: Appears stated age Attitude/Demeanor/Rapport: Unresponsive Affect (typically observed): Quiet Orientation: : Oriented to Self Alcohol / Substance Use: Not Applicable Psych Involvement: No (comment)  Admission diagnosis:  Acute cystitis with hematuria [N30.01] Closed intertrochanteric fracture of left femur, initial encounter (HCC) [S72.142A] Closed fracture of left hip, initial encounter (HCC) [S72.002A] Patient Active Problem List    Diagnosis Date Noted   Falls frequently 11/11/2022   Asymptomatic bacteriuria 11/11/2022   Closed intertrochanteric fracture of left femur, initial encounter (HCC) 11/10/2022   Closed traumatic fractures of multiple bones of right hip and pelvis, initial encounter (HCC) 08/25/2022   Chronic kidney disease, stage 3b (HCC) 08/25/2022   Compression fracture of T10 vertebra (HCC) 08/25/2022   Essential hypertension 08/25/2022   Acute kidney injury superimposed on chronic kidney disease (HCC) 08/14/2021   Hypokalemia 08/14/2021   Generalized weakness 08/14/2021   Acute metabolic encephalopathy 08/14/2021   Paroxysmal atrial fibrillation (HCC) 08/14/2021   Normocytic anemia 08/14/2021   Long term (current) use of anticoagulants 08/13/2021   Secondary hypercoagulable state (HCC) 07/17/2021   Atrial flutter (HCC) 07/11/2021   CAP (community acquired pneumonia) 07/07/2021   Acute respiratory failure with hypoxia (HCC) 07/07/2021   AKI (acute kidney injury) (HCC) 07/07/2021   Troponin level elevated 07/07/2021   Vasovagal episode 06/08/2017   Mixed hyperlipidemia 06/08/2017   Chest pain with moderate risk for cardiac etiology 06/07/2017   Abnormal EKG 06/07/2017   Depression 03/18/2012   Mild obesity 03/18/2012   PCP:  Eloisa Northern, MD Pharmacy:   [facility] [address] [place], [place] - [address] [place] [address] Phone: [phone] Fax: [phone]     Social Determinants of Health (SDOH) Social History: SDOH Screenings   Food Insecurity: Patient Unable To Answer (11/11/2022)  Housing: Patient Unable To Answer (11/11/2022)  Transportation Needs: Patient Unable To Answer (11/11/2022)  Utilities: Patient Unable To Answer (11/11/2022)  Tobacco Use: Low Risk  (11/11/2022)   SDOH Interventions:     Readmission Risk Interventions    11/12/2022    3:06 PM  Readmission Risk Prevention Plan  Transportation Screening Complete   PCP or Specialist Appt within 3-5 Days Complete  HRI or Home Care Consult Complete  Palliative Care Screening Complete  Medication Review (RN Care Manager) Complete

## 2022-11-12 NOTE — NC FL2 (Signed)
[place] MEDICAID FL2 LEVEL OF CARE FORM     IDENTIFICATION  Patient Name: Brandi [name] Birthdate: 09/11/1940 Sex: female Admission Date (Current Location): 11/10/2022  [facility] and [place] Number:  Producer, television/film/video and Address:  [facility],  [address] [place], [place] [address]      Provider Number: [id number]  Attending Physician Name and Address:  Dorcas Carrow, MD  Relative Name and Phone Number:  spouse, Haley Trescott @ [phone]    Current Level of Care: Hospital Recommended Level of Care: Skilled Nursing Facility Prior Approval Number:    Date Approved/Denied:   PASRR Number: [id number]  Discharge Plan: SNF    Current Diagnoses: Patient Active Problem List   Diagnosis Date Noted   Falls frequently 11/11/2022   Asymptomatic bacteriuria 11/11/2022   Closed intertrochanteric fracture of left femur, initial encounter (HCC) 11/10/2022   Closed traumatic fractures of multiple bones of right hip and pelvis, initial encounter (HCC) 08/25/2022   Chronic kidney disease, stage 3b (HCC) 08/25/2022   Compression fracture of T10 vertebra (HCC) 08/25/2022   Essential hypertension 08/25/2022   Acute kidney injury superimposed on chronic kidney disease (HCC) 08/14/2021   Hypokalemia 08/14/2021   Generalized weakness 08/14/2021   Acute metabolic encephalopathy 08/14/2021   Paroxysmal atrial fibrillation (HCC) 08/14/2021   Normocytic anemia 08/14/2021   Long term (current) use of anticoagulants 08/13/2021   Secondary hypercoagulable state (HCC) 07/17/2021   Atrial flutter (HCC) 07/11/2021   CAP (community acquired pneumonia) 07/07/2021   Acute respiratory failure with hypoxia (HCC) 07/07/2021   AKI (acute kidney injury) (HCC) 07/07/2021   Troponin level elevated 07/07/2021   Vasovagal episode 06/08/2017   Mixed hyperlipidemia 06/08/2017   Chest pain with moderate risk for cardiac etiology 06/07/2017   Abnormal EKG 06/07/2017   Depression  03/18/2012   Mild obesity 03/18/2012    Orientation RESPIRATION BLADDER Height & Weight     Self  Normal Incontinent, External catheter (currently with purewick) Weight: 143 lb 15.4 oz (65.3 kg) Height:  4\' 11"  (149.9 cm)  BEHAVIORAL SYMPTOMS/MOOD NEUROLOGICAL BOWEL NUTRITION STATUS      Incontinent Diet (regular)  AMBULATORY STATUS COMMUNICATION OF NEEDS Skin   Extensive Assist (+2) Verbally Other (Comment) (surgical incision only)                       Personal Care Assistance Level of Assistance  Bathing, Feeding, Dressing Bathing Assistance: Limited assistance Feeding assistance: Limited assistance Dressing Assistance: Limited assistance     Functional Limitations Info  Sight, Hearing, Speech Sight Info: Adequate Hearing Info: Adequate Speech Info: Adequate    SPECIAL CARE FACTORS FREQUENCY  PT (By licensed PT), OT (By licensed OT)     PT Frequency: 5x/wk OT Frequency: 5x/wk            Contractures Contractures Info: Not present    Additional Factors Info  Code Status, Allergies, Psychotropic Code Status Info: DNR Allergies Info: Amoxil (Amoxicillin), Celebrex (Celecoxib) Psychotropic Info: see MAR         Current Medications (11/12/2022):  This is the current hospital active medication list Current Facility-Administered Medications  Medication Dose Route Frequency Provider Last Rate Last Admin   acetaminophen (TYLENOL) tablet 650 mg  650 mg Oral Q6H PRN Cecil Cobbs, PA-C   650 mg at 11/12/22 8469   Or   acetaminophen (TYLENOL) suppository 650 mg  650 mg Rectal Q6H PRN Cecil Cobbs, PA-C       Chlorhexidine Gluconate  Cloth 2 % PADS 6 each  6 each Topical Q0600 Ginnie Smart, MD   6 each at 11/12/22 4782   diltiazem (CARDIZEM SR) 12 hr capsule 60 mg  60 mg Oral BID Cecil Cobbs, PA-C   60 mg at 11/12/22 0846   guanFACINE (TENEX) tablet 2 mg  2 mg Oral QPM Kathie Dike M, PA-C   2 mg at 11/10/22 2254   HYDROmorphone  (DILAUDID) injection 0.5 mg  0.5 mg Intravenous Q4H PRN Kathie Dike M, PA-C       metoprolol succinate (TOPROL-XL) 24 hr tablet 25 mg  25 mg Oral BID Dorcas Carrow, MD       mupirocin ointment (BACTROBAN) 2 % 1 Application  1 Application Nasal BID Ginnie Smart, MD   1 Application at 11/12/22 [phone]   oxyCODONE (Oxy IR/ROXICODONE) immediate release tablet 2.5-5 mg  2.5-5 mg Oral Q4H PRN Cecil Cobbs, PA-C   5 mg at 11/12/22 0528   pantoprazole (PROTONIX) EC tablet 40 mg  40 mg Oral Daily Kathie Dike M, PA-C   40 mg at 11/12/22 0845   rosuvastatin (CRESTOR) tablet 10 mg  10 mg Oral Once per day on Sunday Wednesday Cecil Cobbs, [place]   10 mg at 11/11/22 2246   senna-docusate (Senokot-S) tablet 1 tablet  1 tablet Oral QHS PRN Cecil Cobbs, PA-C       sertraline (ZOLOFT) tablet 75 mg  75 mg Oral QHS Kathie Dike M, PA-C   75 mg at 11/10/22 2253     Discharge Medications: Please see discharge summary for a list of discharge medications.  Relevant Imaging Results:  Relevant Lab Results:   Additional Information SSN: [id number]  Amada Jupiter, LCSW

## 2022-11-12 NOTE — Progress Notes (Signed)
Advanced Care Planning Note   Patient Name: Brandi [name]       Date: 11/12/2022 DOB: 1940/12/16  Age: 82 y.o. MRN#: [id number] Attending Physician: Dorcas Carrow, MD Primary Care Physician: Eloisa Northern, MD Admit Date: 11/10/2022 Length of Stay: 2 days  Advanced Care Planning Details:   Persons Present: ACP Persons Present: Other: Patient's spouse Tasia Catchings) via telephone  Provider(s) Present: Palliative Medicine Provider Wynne Dust, NP  All persons present were open and agreeable to conversation about advanced care planning. Education offered on the importance of documentation and the logistics of securing advanced directives.   We had in-depth discussion on various topics including trajectory of illness, concern for high risk of decline, frequent recent falls, and current plan of care for this hospitalization.  See consult note from earlier today for details.  Education was offered on the following:   Medical treatment options available, Available ACP documents able to be completed, and Importance/Benefit of completing ACP documents  Forms completed include:  Other None today These forms can be found in the ACP link in the area under the patient's picture  After conversation, patient and/or family determined the desired discharge disposition at this time would be:  SNF    Summary of ACP Decisions:   Confirmed DNR Discussed advance care planning and goals of care including feeding tubes, fluids, medications Encouraged family discussion about advance care planning Provided "hard choices for loving people" booklet to assist in discussions Encouraged them to reach out to palliative medicine for any questions or needs     Thank you for allowing [place] to participate in the care of [place]ara Bufkin PMT will continue to support holistically.  Total ACP Time: 25 min  The time above is specific to time spend on advanced care planning. It does not include any separately billed  services.  Signed by: Wynne Dust, DNP, AGNP-C Palliative Medicine Team  Team Phone # [phone] (Nights/Weekends)  11/12/2022, 3:28 PM

## 2022-11-12 NOTE — Consult Note (Signed)
Palliative Care Consult Note                                  Date: 11/12/2022   Patient Name: Brandi [name]  DOB: [birth year]  MRN: [id number]  Age / Sex: 82 y.o., female  PCP: Eloisa Northern, MD Referring Physician: Dorcas Carrow, MD  Reason for Consultation: Establishing goals of care  HPI/Patient Profile: 82 y.o. female  with past medical history of A-fib and flutter on Coumadin, frequent falls, dementia, recent right pubic ramus fracture that was managed nonoperatively went home 2 weeks ago, poor mobility status. Fell 2 nights ago and came to emergency room on 10/22. She was found to have left intertrochanteric fracture. She was admitted on 11/10/2022 with closed traumatic left [place] Tarik fracture of the hip, AKI on CKD stage IIIb, and others.   Palliative medicine was consulted for GOC conversations.  Past Medical History:  Diagnosis Date   Allergy    Atrial fibrillation (HCC)    Dysrhythmia    Hyperlipidemia    Hypertension    Neuromuscular disorder (HCC)    Venous insufficiency     Subjective:   This NP Wynne Dust reviewed medical records, received report from team, assessed the patient and then meet at the patient's bedside to discuss diagnosis, prognosis, GOC, EOL wishes disposition and options.  I met with the patient at the bedside, although she is a bit confused.  No family was present.  After visiting with the patient I called her husband Tasia Catchings at home.   We meet to discuss diagnosis prognosis, GOC, EOL wishes, disposition and options. Concept of Palliative Care was introduced as specialized medical care for people and their families living with serious illness.  If focuses on providing relief from the symptoms and stress of a serious illness.  The goal is to improve quality of life for both the patient and the family. Values and goals of care important to patient and family were attempted to be elicited.  When I  spoke with the patient she states that she is doing okay.  Denies pain.  Kept repeating herself that "I am okay".  Answering in short words and partial sentences.  Because of this I chose to call her husband for further discussions.  Created space and opportunity for patient  and family to explore thoughts and feelings regarding current medical situation   Natural trajectory and current clinical status were discussed. Questions and concerns addressed. Patient  encouraged to call with questions or concerns.    Patient/Family Understanding of Illness: The patient's husband understands that on Monday the patient fell and broke her left hip.  Her son was staying with them and he heard her fall but helped her back to bed as she did not want her husband note.  Most the day Tuesday morning she would not get out of bed and when they are home health aide came in the afternoon she gave her a bed bath, put on her nightgown got her to the wheelchair.  At this point they called EMS which brought her [facility]  He knows that she is here with a broken hip.  He states that it "looks like" she has dementia.  One year ago she was admitted for confusion and ever since then she has not been the same mentally.  We spent time talking about her current clinical situation,  including clarifying details.  Life Review: The patient and her husband have been married for 60 years.  They have 2 sons and a daughter.  Their daughter lives in town and son lives in [place].  Her their other son lives with them.  They also have a caring terrier mix dog who is about 20 pound name "pickles".  She previously enjoyed scrapbooking and participating in politics.  She previously worked in Insurance account manager.  She was born in [place], [place] and he was born in [place].  They moved here 1974 and still live in a house they had built in 1978.  He does note that she is known to be "stubborn"  Patient  Values: Family  Goals: To continue to try to get better, agreeable to SNF/rehab, hopeful to be able to get the patient home with continued home care  Today's Discussion: In addition to discussion described above he had substantial discussion of various topics. We discussed her general concern given her frequent falls and apparent recent decline that she is at high risk to decompensate in the coming days, weeks, months.  I use this as a springboard to discuss the importance of talking about advance care planning and goals of care.  He agreed that the patient is a DNR and would not want resuscitation.  I broached the topic of feeding tubes, artificial prolongation of life through various means, etc.  He states they have not had a chance to talk about this.  However, he wants to talk with his son about it as well as the patient if possible.  I told him I would leave a copy of "difficult choices for loving people" at the bedside along with my card for contact information.  I provided that I would follow-up with the patient tomorrow to see how she is doing.  I shared our contact information including our team phone number and offered that he can call our team for any questions or concerns.  I provided emotional and general support through therapeutic listening, empathy, sharing of stories, and other techniques. I answered all questions and addressed all concerns to the best of my ability.  ROS Limited due to confusion Review of Systems  Constitutional:        Denies pain in general  Respiratory:  Negative for shortness of breath.   Gastrointestinal:  Negative for abdominal pain, nausea and vomiting.    Objective:   Primary Diagnoses: Present on Admission:  Closed intertrochanteric fracture of left femur, initial encounter [facility])   Physical Exam Vitals and nursing note reviewed.  Constitutional:      General: She is sleeping. She is not in acute distress.    Appearance: She is ill-appearing.   Cardiovascular:     Rate and Rhythm: Normal rate.  Pulmonary:     Effort: Pulmonary effort is normal. No respiratory distress.     Breath sounds: No wheezing or rhonchi.  Abdominal:     General: Abdomen is flat. Bowel sounds are normal. There is no distension.     Palpations: Abdomen is soft.  Skin:    General: Skin is warm and dry.  Neurological:     Mental Status: She is easily aroused. She is disoriented.  Psychiatric:        Mood and Affect: Mood normal.        Behavior: Behavior normal.     Vital Signs:  BP (!) 147/74 (BP Location: Right Arm)   Pulse 63   Temp 98.3 F (  36.8 C)   Resp 16   Ht 4\' 11"  (1.499 m)   Wt 65.3 kg   SpO2 93%   BMI 29.08 kg/m   Palliative Assessment/Data: 40%    Advanced Care Planning:   Existing Vynca/ACP Documentation: None  Primary Decision Maker: NEXT OF KIN  Code Status/Advance Care Planning: DNR  A discussion was had today regarding advanced directives. Concepts specific to code status, artifical feeding and hydration, continued IV antibiotics and rehospitalization was had.  The difference between a aggressive medical intervention path and a palliative comfort care path for this patient at this time was had.   Decisions/Changes to ACP: Confirmed DNR  Assessment & Plan:   Impression: 82 year old female with acute presentation chronic comorbidities as described above.  At this point she is on and off confused consistent with described history of possible dementia.  This is her second fall in the past couple months, both with fractures.  Given her frequent falls and changing mental status there is concern for her prognosis over the coming months.  I had a good discussion with the patient's husband who seems understand that it is a good idea to start discussing advance care planning and goals of care.  I provided information for him to help prompt these discussions with family.  He seems to be on board.  Long-term prognosis guarded  to poor  SUMMARY OF RECOMMENDATIONS   Remain DNR-limited Continue to treat the treatable Family hopeful for improvement to be able to eventually return home with support Continue to encourage family discussions on ACP and goals of care Palliative medicine will continue to follow  Symptom Management:  Per primary team PMT is available to assist as needed  Prognosis:  Unable to determine  Discharge Planning:  To Be Determined   Discussed with: Patient, patient's family, medical team, nursing team    Thank you for allowing [place] to participate in the care of Melise Wiedemann PMT will continue to support holistically.  Time Total: 60 min  Detailed review of medical records (labs, imaging, vital signs), medically appropriate exam, discussed with treatment team, counseling and education to patient, family, & staff, documenting clinical information, medication management, coordination of care  Signed by: Wynne Dust, NP Palliative Medicine Team  Team Phone # [phone] (Nights/Weekends)  11/12/2022, 1:14 PM

## 2022-11-12 NOTE — Progress Notes (Signed)
PROGRESS NOTE    Brandi [name]  NGE:[id number] DOB: [birth year] DOA: 11/10/2022 PCP: Eloisa Northern, MD    Brief Narrative:  82 year old with history of A-fib and flutter on Coumadin, frequent falls, dementia, recent right pubic ramus fracture that was managed nonoperatively went home 2 weeks ago, poor mobility status.  Fell 2 nights ago and came to emergency room on 10/22.  She was found to have left intertrochanteric fracture.  She was brought from [facility] to [place] for OR availability.  INR was reversed.  Subjective: Patient seen and examined.  Poor historian.  Unable to describe any symptoms.  Denies any pain. Nursing reported 1 time in and out with 1500 mL urine last night, now on pure week.    Assessment & Plan:   Closed traumatic left intertrochanteric fracture of the hip, recent right pubic rami fractures. Status post IM nail intertrochanteric fracture left, Dr. Blanchie Dessert 10/23 Weightbearing as tolerated Adequate pain medications, combination medication regimen none opiates and opiates.  Bowel regimens to avoid constipation. DVT prophylaxis, advised to resume Coumadin postop, will start today. Orthopedics to schedule follow-up in 2 weeks.  AKI on CKD stage IIIb: This is likely due to hemodynamic instability during periprocedure.  Will avoid hypotension.  Discontinue antihypertensives except rate control medications.  Recheck tomorrow morning.  Chronic medical issues including Acute on chronic anemia, hemoglobin 8.6-7.8.  Anticipated from procedure and hemodilution.  Will continue to monitor. Physical deconditioning, advanced frailty and debility.  Work with PT OT.   Paroxysmal A-fib, hypertension: Resume Coumadin today.  Continue Cardizem and metoprolol. Hyperlipidemia, on Crestor. Depression, on sertraline.  Goal of care:  High 20-month mortality with pelvic fracture now with hip fracture and patient with gradual worsening mental status and profound debility.   She will benefit with palliative care consultation.  She is already DNR with limited intervention.  She may benefit with outpatient palliation or even hospice.    DVT prophylaxis: SCDs Start: 11/10/22 2043   Code Status: DNR with limited intervention, documented on admission. Family Communication: None at the bedside Disposition Plan: Status is: Inpatient Remains inpatient appropriate because: Immediate postop     Consultants:  Palliative care Orthopedics  Procedures:  IM nail left hip  Antimicrobials:  Perioperative     Objective: Vitals:   11/11/22 2247 11/12/22 0200 11/12/22 0519 11/12/22 0952  BP: (!) 156/102 120/74 (!) 116/95 (!) 147/74  Pulse: 73 61 60 63  Resp: 16 16 16 16   Temp: 97.6 F (36.4 C) (!) 97.5 F (36.4 C) 98.6 F (37 C) 98.3 F (36.8 C)  TempSrc: Axillary Oral Oral   SpO2: 95% 97% 100% 99%  Weight:      Height:        Intake/Output Summary (Last 24 hours) at 11/12/2022 1015 Last data filed at 11/12/2022 0515 Gross per 24 hour  Intake 1815 ml  Output 1750 ml  Net 65 ml   Filed Weights   11/10/22 1549 11/11/22 1129  Weight: 65.3 kg 65.3 kg    Examination:  General exam: Appears calm and comfortable at rest. Alert and awake.  Not oriented.  Flat affect.  Not very interactive. Respiratory system: Clear to auscultation.  No added sounds. Cardiovascular system: S1 & S2 heard, RRR.  Gastrointestinal system: Soft.  Nontender.  Bowel sound present. Extremities: Symmetric 5 x 5 power. Left hip immediate postop.  Lateral thigh incisions with minimal swelling.  Nontender and intact.  Data Reviewed: I have personally reviewed following labs and imaging studies  CBC: Recent Labs  Lab 11/10/22 1629 11/11/22 0544 11/12/22 0347  WBC 13.8* 12.5* 11.7*  NEUTROABS 12.0*  --   --   HGB 8.6* 7.8* 7.8*  HCT 28.3* 25.2* 25.4*  MCV 92.8 91.0 90.4  PLT 201 191 145*   Basic Metabolic Panel: Recent Labs  Lab 11/10/22 1629 11/11/22 0544  11/12/22 0347  NA 140 139 135  K 3.9 4.1 4.1  CL 108 105 103  CO2 23 23 22   GLUCOSE 131* 130* 188*  BUN 20 19 29*  CREATININE 1.34* 1.28* 1.60*  CALCIUM 8.7* 8.8* 7.9*   GFR: Estimated Creatinine Clearance: 22.3 mL/min (A) (by C-G formula based on SCr of 1.6 mg/dL (H)). Liver Function Tests: Recent Labs  Lab 11/10/22 1629 11/12/22 0347  AST 22 19  ALT 19 14  ALKPHOS 83 70  BILITOT 0.6 0.4  PROT 6.1* 5.3*  ALBUMIN 3.0* 2.5*   No results for input(s): "LIPASE", "AMYLASE" in the last 168 hours. No results for input(s): "AMMONIA" in the last 168 hours. Coagulation Profile: Recent Labs  Lab 11/10/22 1629 11/11/22 0544 11/12/22 0347  INR 1.9* 2.2* 3.1*   Cardiac Enzymes: No results for input(s): "CKTOTAL", "CKMB", "CKMBINDEX", "TROPONINI" in the last 168 hours. BNP (last 3 results) No results for input(s): "PROBNP" in the last 8760 hours. HbA1C: No results for input(s): "HGBA1C" in the last 72 hours. CBG: No results for input(s): "GLUCAP" in the last 168 hours. Lipid Profile: No results for input(s): "CHOL", "HDL", "LDLCALC", "TRIG", "CHOLHDL", "LDLDIRECT" in the last 72 hours. Thyroid Function Tests: No results for input(s): "TSH", "T4TOTAL", "FREET4", "T3FREE", "THYROIDAB" in the last 72 hours. Anemia Panel: No results for input(s): "VITAMINB12", "FOLATE", "FERRITIN", "TIBC", "IRON", "RETICCTPCT" in the last 72 hours. Sepsis Labs: No results for input(s): "PROCALCITON", "LATICACIDVEN" in the last 168 hours.  Recent Results (from the past 240 hour(s))  Surgical pcr screen     Status: Abnormal   Collection Time: 11/11/22  6:06 AM   Specimen: Nasal Mucosa; Nasal Swab  Result Value Ref Range Status   MRSA, PCR NEGATIVE NEGATIVE Final   Staphylococcus aureus POSITIVE (A) NEGATIVE Final    Comment: (NOTE) The Xpert SA Assay (FDA approved for NASAL specimens in patients 40 years of age and older), is one component of a comprehensive surveillance program. It is not  intended to diagnose infection nor to guide or monitor treatment. Performed at [facility], [address] [place], [place] [address]          Radiology Studies: DG HIP UNILAT W OR W/O PELVIS 2-3 VIEWS LEFT  Result Date: 11/11/2022 CLINICAL DATA:  Postop fracture fixation. EXAM: DG HIP (WITH OR WITHOUT PELVIS) 2-3V LEFT COMPARISON:  Preoperative imaging FINDINGS: Femoral intramedullary nail with trans trochanteric and distal locking screw fixation traverse proximal femur fracture. The subtrochanteric component laterally is new from prior exam. There are remote fractures of the right pubic rami. IMPRESSION: ORIF of proximal left femur fracture. Electronically Signed   By: Narda Rutherford M.D.   On: 11/11/2022 17:49   DG FEMUR MIN 2 VIEWS LEFT  Result Date: 11/11/2022 CLINICAL DATA:  Elective surgery. EXAM: LEFT FEMUR 2 VIEWS COMPARISON:  Preoperative imaging FINDINGS: Fourteen fluoroscopic spot views of the left femur obtained in the operating room. Sequential images during femoral intramedullary nail with trans trochanteric and distal locking screw fixation traversing proximal femur fracture. Fluoroscopy time 2 minutes 2 seconds. Dose 20.629 mGy. IMPRESSION: Intraoperative fluoroscopy during proximal femur fracture fixation. Electronically Signed  By: Narda Rutherford M.D.   On: 11/11/2022 17:45   DG C-Arm 1-60 Min-No Report  Result Date: 11/11/2022 Fluoroscopy was utilized by the requesting physician.  No radiographic interpretation.   DG C-Arm 1-60 Min-No Report  Result Date: 11/11/2022 Fluoroscopy was utilized by the requesting physician.  No radiographic interpretation.   DG FEMUR MIN 2 VIEWS LEFT  Result Date: 11/11/2022 CLINICAL DATA:  Intertrochanteric femur fracture. EXAM: LEFT FEMUR 2 VIEWS COMPARISON:  Hip radiograph earlier today. FINDINGS: Unchanged alignment of intertrochanteric femur fracture with displacement of the lesser and greater trochanters. The  distal femur is intact. Prior knee arthroplasty. Vascular calcifications are seen. IMPRESSION: Unchanged alignment of intertrochanteric femur fracture. Electronically Signed   By: Narda Rutherford M.D.   On: 11/11/2022 00:02   DG Hip Unilat With Pelvis 2-3 Views Left  Result Date: 11/10/2022 CLINICAL DATA:  Recent fall with hip pain, initial encounter EXAM: DG HIP (WITH OR WITHOUT PELVIS) 3V LEFT COMPARISON:  08/25/2022 FINDINGS: Intratrochanteric left femoral fracture is noted with impaction and angulation at the fracture site. The femoral head is well seated. Pelvic ring is intact. Old healed superior and inferior pubic rami fractures on the right are noted. IMPRESSION: Left intratrochanteric femoral fracture. 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 Electronically Signed   By: Alcide Clever M.D.   On: 11/10/2022 19:49   CT Head Wo Contrast  Result Date: 11/10/2022 CLINICAL DATA:  Ataxia, head trauma. Polytrauma, blunt. Additional history obtained: Lethargy. Fatigue. EXAM: CT HEAD WITHOUT CONTRAST CT CERVICAL SPINE WITHOUT CONTRAST TECHNIQUE: Multidetector CT imaging of the head and cervical spine was performed following the standard protocol without intravenous contrast. Multiplanar CT image reconstructions of the cervical spine were also generated. RADIATION DOSE REDUCTION: This exam was performed according to the departmental dose-optimization program which includes automated exposure control, adjustment of the mA and/or kV according to patient size and/or use of iterative reconstruction technique. COMPARISON:  Head CT 08/25/2022.  Cervical spine CT 08/25/2022. FINDINGS: CT HEAD FINDINGS Brain: Prominence of the ventricles and sulci which appears commensurate with the degree of cerebral volume loss. Patchy and ill-defined hypoattenuation within the cerebral white matter, nonspecific but compatible with moderate chronic small vessel ischemic disease. There is no acute intracranial hemorrhage. No  demarcated cortical infarct. No extra-axial fluid collection. No evidence of an intracranial mass. No midline shift. Vascular: No hyperdense vessel.  Atherosclerotic calcifications Skull: No calvarial fracture or aggressive osseous lesion. Sinuses/Orbits: No mass or acute finding within the imaged orbits. Complete opacification of the right maxillary sinus at the imaged levels (with superimposed foci mineralization within the sinus). Partial opacification of anterior right ethmoid air cells, overall mild-to-moderate in severity. CT CERVICAL SPINE FINDINGS Alignment: Nonspecific reversal of the expected cervical lordosis. 3 mm C3-C4 grade 1 anterolisthesis. 2 mm C4-C5 grade 1 anterolisthesis. Slight C5-C6 grade 1 retrolisthesis. 2-3 mm C7-T1 grade 1 anterolisthesis. Skull base and vertebrae: The basion-dental and atlanto-dental intervals are maintained.No evidence of acute fracture to the cervical spine. Facet ankylosis on the right at C2-C3. Multilevel ventral osteophytes. Soft tissues and spinal canal: No prevertebral fluid or swelling. No visible canal hematoma. Disc levels: Cervical spondylosis with multilevel disc space narrowing, disc bulges/central disc protrusions, posterior disc osteophyte complexes, endplate spurring, uncovertebral hypertrophy and facet arthrosis. Disc space narrowing is greatest at C5-C6 and C6-C7 (advanced at these levels). Multilevel spinal canal narrowing. Most notably, a posterior disc osteophyte complex contributes to spinal canal stenosis at C5-C6 which is suspected moderate in severity. Multilevel bony neural  foraminal narrowing. Degenerative changes also present at the C1-C2 articulation. Upper chest: No consolidation within the imaged lung apices. No visible pneumothorax. Multiple thyroid nodules measuring up to 12 mm, not meeting consensus criteria for ultrasound follow-up based on size. No follow-up imaging recommended. Reference: J Am Coll Radiol. 2015 Feb;12(2): 143-50.  IMPRESSION: CT head: 1.  No evidence of an acute intracranial abnormality. 2. Parenchymal atrophy and chronic small vessel ischemic disease. 3. Paranasal sinus disease at the imaged levels, as described. CT cervical spine: 1. No evidence of an acute cervical spine fracture. 2. Nonspecific reversal of the expected cervical lordosis. 3. Grade 1 spondylolisthesis at C3-C4, C4-C5, C5-C6 and C7-T1. 4. Cervical spondylosis as described. 5. Facet ankylosis on the right at C2-C3. Electronically Signed   By: Jackey Loge D.O.   On: 11/10/2022 19:48   CT Cervical Spine Wo Contrast  Result Date: 11/10/2022 CLINICAL DATA:  Ataxia, head trauma. Polytrauma, blunt. Additional history obtained: Lethargy. Fatigue. EXAM: CT HEAD WITHOUT CONTRAST CT CERVICAL SPINE WITHOUT CONTRAST TECHNIQUE: Multidetector CT imaging of the head and cervical spine was performed following the standard protocol without intravenous contrast. Multiplanar CT image reconstructions of the cervical spine were also generated. RADIATION DOSE REDUCTION: This exam was performed according to the departmental dose-optimization program which includes automated exposure control, adjustment of the mA and/or kV according to patient size and/or use of iterative reconstruction technique. COMPARISON:  Head CT 08/25/2022.  Cervical spine CT 08/25/2022. FINDINGS: CT HEAD FINDINGS Brain: Prominence of the ventricles and sulci which appears commensurate with the degree of cerebral volume loss. Patchy and ill-defined hypoattenuation within the cerebral white matter, nonspecific but compatible with moderate chronic small vessel ischemic disease. There is no acute intracranial hemorrhage. No demarcated cortical infarct. No extra-axial fluid collection. No evidence of an intracranial mass. No midline shift. Vascular: No hyperdense vessel.  Atherosclerotic calcifications Skull: No calvarial fracture or aggressive osseous lesion. Sinuses/Orbits: No mass or acute finding within  the imaged orbits. Complete opacification of the right maxillary sinus at the imaged levels (with superimposed foci mineralization within the sinus). Partial opacification of anterior right ethmoid air cells, overall mild-to-moderate in severity. CT CERVICAL SPINE FINDINGS Alignment: Nonspecific reversal of the expected cervical lordosis. 3 mm C3-C4 grade 1 anterolisthesis. 2 mm C4-C5 grade 1 anterolisthesis. Slight C5-C6 grade 1 retrolisthesis. 2-3 mm C7-T1 grade 1 anterolisthesis. Skull base and vertebrae: The basion-dental and atlanto-dental intervals are maintained.No evidence of acute fracture to the cervical spine. Facet ankylosis on the right at C2-C3. Multilevel ventral osteophytes. Soft tissues and spinal canal: No prevertebral fluid or swelling. No visible canal hematoma. Disc levels: Cervical spondylosis with multilevel disc space narrowing, disc bulges/central disc protrusions, posterior disc osteophyte complexes, endplate spurring, uncovertebral hypertrophy and facet arthrosis. Disc space narrowing is greatest at C5-C6 and C6-C7 (advanced at these levels). Multilevel spinal canal narrowing. Most notably, a posterior disc osteophyte complex contributes to spinal canal stenosis at C5-C6 which is suspected moderate in severity. Multilevel bony neural foraminal narrowing. Degenerative changes also present at the C1-C2 articulation. Upper chest: No consolidation within the imaged lung apices. No visible pneumothorax. Multiple thyroid nodules measuring up to 12 mm, not meeting consensus criteria for ultrasound follow-up based on size. No follow-up imaging recommended. Reference: J Am Coll Radiol. 2015 Feb;12(2): 143-50. IMPRESSION: CT head: 1.  No evidence of an acute intracranial abnormality. 2. Parenchymal atrophy and chronic small vessel ischemic disease. 3. Paranasal sinus disease at the imaged levels, as described. CT cervical spine: 1. No evidence of  an acute cervical spine fracture. 2. Nonspecific  reversal of the expected cervical lordosis. 3. Grade 1 spondylolisthesis at C3-C4, C4-C5, C5-C6 and C7-T1. 4. Cervical spondylosis as described. 5. Facet ankylosis on the right at C2-C3. Electronically Signed   By: Jackey Loge D.O.   On: 11/10/2022 19:48   DG Chest 1 View  Result Date: 11/10/2022 CLINICAL DATA:  Weakness EXAM: PORTABLE CHEST 1 VIEW COMPARISON:  08/25/2022 FINDINGS: Cardiac shadow is stable. Tortuous thoracic aorta is noted. The lungs are well aerated bilaterally. Stable scarring is noted in the midportion of both lungs. No new focal infiltrate is seen. IMPRESSION: No acute abnormality noted. Electronically Signed   By: Alcide Clever M.D.   On: 11/10/2022 19:48        Scheduled Meds:  Chlorhexidine Gluconate Cloth  6 each Topical Q0600   diltiazem  60 mg Oral BID   guanFACINE  2 mg Oral QPM   lisinopril  5 mg Oral Daily   metoprolol tartrate  25 mg Oral BID   mupirocin ointment  1 Application Nasal BID   pantoprazole  40 mg Oral Daily   rosuvastatin  10 mg Oral Once per day on Sunday Wednesday   sertraline  75 mg Oral QHS   Continuous Infusions:   LOS: 2 days    Time spent: 40 minutes    Dorcas Carrow, MD [facility]

## 2022-11-12 NOTE — Progress Notes (Signed)
PHARMACY - ANTICOAGULATION CONSULT NOTE  Pharmacy Consult for warfarin Indication: hx of atrial fibrillation  Allergies  Allergen Reactions   Amoxil [Amoxicillin] Diarrhea   Celebrex [Celecoxib] Other (See Comments)    Bruising and stomach pain    Patient Measurements: Height: 4\' 11"  (149.9 cm) Weight: 65.3 kg (143 lb 15.4 oz) IBW/kg (Calculated) : 43.2 Heparin Dosing Weight: 57/.4 kg   Vital Signs: Temp: 98.3 F (36.8 C) (10/24 0952) Temp Source: Oral (10/24 0519) BP: 147/74 (10/24 0952) Pulse Rate: 63 (10/24 0952)  Labs: Recent Labs    11/10/22 1629 11/11/22 0544 11/12/22 0347  HGB 8.6* 7.8* 7.8*  HCT 28.3* 25.2* 25.4*  PLT 201 191 145*  LABPROT 21.8* 24.8* 32.3*  INR 1.9* 2.2* 3.1*  CREATININE 1.34* 1.28* 1.60*    Estimated Creatinine Clearance: 22.3 mL/min (A) (by C-G formula based on SCr of 1.6 mg/dL (H)).   Medical History: Past Medical History:  Diagnosis Date   Allergy    Atrial fibrillation (HCC)    Dysrhythmia    Hyperlipidemia    Hypertension    Neuromuscular disorder (HCC)    Venous insufficiency     Medications:  PTA on warfarin 2 mg daily except 1 mg MWF. Last dose PTA was 2 mg on 11/09/2022 at 1700.  Assessment: Patient is an 82 y.o. female with a history of Afib and flutter on warfarin PTA, fall risk, dementia, and right pubic ramus fracture in August not requiring surgery, who presented to [facility] 10/22 for evaluation status post fall. Found to have intertrochanteric fracture of the left hip and transferred to [facility] on 10/23. She underwent IM nail of the left IT fracture on 10/23. Pharmacy consulted on 10/24 to resume warfarin.   11/12/2022  - INR 3.1 post-op, supratherapeutic and trending up despite last warfarin dose taken on 10/21   - Hgb stable at 7.8 - Platelets trending down at 145  - patient eating 75% of meal today  - no significant DDIs - no s/sx of bleeding documented    Goal of Therapy:  INR 2-3 Monitor platelets by  anticoagulation protocol: Yes   Plan:  - Hold warfarin today due to supratherapeutic INR  - Daily INR  - monitor CBC  - monitor s/sx of bleeding   Karlton Lemon, PharmD Candidate  11/12/2022,10:50 AM

## 2022-11-12 NOTE — Progress Notes (Signed)
OT Cancellation Note  Patient Details Name: Brandi [name] MRN: [id number] DOB: [birth year]   Cancelled Treatment:    Reason Eval/Treat Not Completed: Other (comment). Nursing staff present in the room and providing personal care. Will continue efforts to complete eval.    Reuben Likes, OTR/L 11/12/2022, 5:23 PM

## 2022-11-12 NOTE — Evaluation (Signed)
Physical Therapy Evaluation Patient Details Name: Brandi [name] MRN: [id number] DOB: Jul 09, 1940 Today's Date: 11/12/2022  History of Present Illness  82 year old with history of A-fib and flutter on Coumadin, frequent falls, dementia; also with admission for recent right sup/inf pubic ramus fractures that were managed nonoperatively, pt discharged to SNF and then home 2 weeks ago, poor mobility status.  Pt fell a couple days prior to presenting to emergency room on 10/22.  She was found to have left intertrochanteric fracture which is s/p left IM nail on 11/11/22.  Clinical Impression  Patient is s/p above surgery resulting in functional limitations due to the deficits listed below (see PT Problem List).  Patient will benefit from acute skilled PT to increase their independence and safety with mobility to facilitate discharge.  Pt able to follow simple commands.  Pt requiring +2 assist for bed mobility and standing at edge of bed today.  Pt poor historian however per chart review, pt was recently at [facility] for rehab and discharged home.  Pt will need increased assist upon d/c.  Per hospitalist note, palliative consult pending.  Will continue to work with pt and monitor GOC and discharge plan.         If plan is discharge home, recommend the following: Two people to help with walking and/or transfers;Two people to help with bathing/dressing/bathroom   Can travel by private vehicle   No    Equipment Recommendations None recommended by PT (per facility)  Recommendations for Other Services       Functional Status Assessment Patient has had a recent decline in their functional status and demonstrates the ability to make significant improvements in function in a reasonable and predictable amount of time.     Precautions / Restrictions Precautions Precautions: Fall Restrictions Weight Bearing Restrictions: No LLE Weight Bearing: Weight bearing as tolerated Other Position/Activity  Restrictions: prior pelvic fractures appear WBAT per 08/26/22 PT evaluation note      Mobility  Bed Mobility Overal bed mobility: Needs Assistance Bed Mobility: Supine to Sit, Sit to Supine     Supine to sit: Max assist, HOB elevated, Used rails Sit to supine: Max assist, +2 for physical assistance   General bed mobility comments: multimodal cues for technique, pt required assist to inch LEs over EOB and for trunk upright, utilized +2 for assist back to bed    Transfers Overall transfer level: Needs assistance Equipment used: Rolling walker (2 wheels) Transfers: Sit to/from Stand Sit to Stand: Max assist, +2 physical assistance, Mod assist           General transfer comment: multimodal cues for technique, assist to rise and maintain standing; performed x3 for linen change and pericare (NT assisting); pt unable to shift weight so did not yet attempt transfer to recliner    Ambulation/Gait                  Stairs            Wheelchair Mobility     Tilt Bed    Modified Rankin (Stroke Patients Only)       Balance Overall balance assessment: History of Falls                                           Pertinent Vitals/Pain Pain Assessment Pain Assessment: PAINAD Breathing: normal Negative Vocalization: occasional moan/groan, low speech, negative/disapproving quality Facial Expression:  facial grimacing Body Language: relaxed Consolability: distracted or reassured by voice/touch PAINAD Score: 4 Pain Location: left hip/thigh Pain Descriptors / Indicators: Guarding, Grimacing Pain Intervention(s): Repositioned, Monitored during session    Home Living Family/patient expects to be discharged to:: Private residence                 Home Equipment: Agricultural consultant (2 wheels)      Prior Function Prior Level of Function : Patient poor historian/Family not available             Mobility Comments: reports walking with walker ?   recently returned home from [facility]       Extremity/Trunk Assessment        Lower Extremity Assessment Lower Extremity Assessment: Generalized weakness;LLE deficits/detail LLE Deficits / Details: required assist due to pain LLE: Unable to fully assess due to pain       Communication   Communication Communication: No apparent difficulties  Cognition Arousal: Alert Behavior During Therapy: Flat affect Overall Cognitive Status: History of cognitive impairments - at baseline                                 General Comments: provides mostly one word answers, poor historian, hx dementia        General Comments      Exercises     Assessment/Plan    PT Assessment Patient needs continued PT services  PT Problem List Decreased strength;Decreased activity tolerance;Decreased balance;Decreased mobility;Decreased knowledge of use of DME;Decreased safety awareness       PT Treatment Interventions DME instruction;Gait training;Balance training;Stair training;Functional mobility training;Therapeutic activities;Therapeutic exercise;Wheelchair mobility training    PT Goals (Current goals can be found in the Care Plan section)  Acute Rehab PT Goals PT Goal Formulation: Patient unable to participate in goal setting Time For Goal Achievement: 11/26/22 Potential to Achieve Goals: Fair    Frequency Min 1X/week     Co-evaluation               AM-PAC PT "6 Clicks" Mobility  Outcome Measure Help needed turning from your back to your side while in a flat bed without using bedrails?: Total Help needed moving from lying on your back to sitting on the side of a flat bed without using bedrails?: Total Help needed moving to and from a bed to a chair (including a wheelchair)?: Total Help needed standing up from a chair using your arms (e.g., wheelchair or bedside chair)?: Total Help needed to walk in hospital room?: Total Help needed climbing 3-5 steps with a railing? :  Total 6 Click Score: 6    End of Session Equipment Utilized During Treatment: Gait belt Activity Tolerance: Patient tolerated treatment well Patient left: in bed;with bed alarm set;with call bell/phone within reach;with nursing/sitter in room   PT Visit Diagnosis: Difficulty in walking, not elsewhere classified (R26.2);Pain Pain - Right/Left: Left Pain - part of body: Hip    Time: 2956-2130 PT Time Calculation (min) (ACUTE ONLY): 18 min   Charges:   PT Evaluation $PT Eval Low Complexity: 1 Low   PT General Charges $$ ACUTE PT VISIT: 1 Visit        Thomasene Mohair PT, DPT Physical Therapist Acute Rehabilitation Services Office: [phone]   Janan Halter Payson 11/12/2022, 12:16 PM

## 2022-11-12 NOTE — Anesthesia Postprocedure Evaluation (Signed)
Anesthesia Post Note  Patient: Brandi [name]  Procedure(s) Performed: INTRAMEDULLARY (IM) NAIL INTERTROCHANTERIC (Left)     Patient location during evaluation: PACU Anesthesia Type: General Level of consciousness: awake and alert Pain management: pain level controlled Vital Signs Assessment: post-procedure vital signs reviewed and stable Respiratory status: spontaneous breathing, nonlabored ventilation, respiratory function stable and patient connected to nasal cannula oxygen Cardiovascular status: blood pressure returned to baseline and stable Postop Assessment: no apparent nausea or vomiting Anesthetic complications: no   No notable events documented.  Last Vitals:    Last Pain:                  Collene Schlichter

## 2022-11-12 NOTE — Progress Notes (Signed)
I&O catheter yielded 1500 mL.

## 2022-11-13 DIAGNOSIS — Z66 Do not resuscitate: Secondary | ICD-10-CM | POA: Diagnosis not present

## 2022-11-13 DIAGNOSIS — Z515 Encounter for palliative care: Secondary | ICD-10-CM | POA: Diagnosis not present

## 2022-11-13 DIAGNOSIS — S72142A Displaced intertrochanteric fracture of left femur, initial encounter for closed fracture: Secondary | ICD-10-CM | POA: Diagnosis not present

## 2022-11-13 DIAGNOSIS — Z7189 Other specified counseling: Secondary | ICD-10-CM | POA: Diagnosis not present

## 2022-11-13 LAB — BASIC METABOLIC PANEL
Anion gap: 7 (ref 5–15)
BUN: 32 mg/dL — ABNORMAL HIGH (ref 8–23)
CO2: 23 mmol/L (ref 22–32)
Calcium: 7.7 mg/dL — ABNORMAL LOW (ref 8.9–10.3)
Chloride: 101 mmol/L (ref 98–111)
Creatinine, Ser: 1.78 mg/dL — ABNORMAL HIGH (ref 0.44–1.00)
GFR, Estimated: 28 mL/min — ABNORMAL LOW (ref >60)
Glucose, Bld: 108 mg/dL — ABNORMAL HIGH (ref 70–99)
Potassium: 3.9 mmol/L (ref 3.5–5.1)
Sodium: 131 mmol/L — ABNORMAL LOW (ref 135–145)

## 2022-11-13 LAB — PROTIME-INR
INR: 3.6 — ABNORMAL HIGH (ref 0.8–1.2)
Prothrombin Time: 36.1 s — ABNORMAL HIGH (ref 11.4–15.2)

## 2022-11-13 LAB — CBC WITH DIFFERENTIAL/PLATELET
Abs Immature Granulocytes: 0.08 10*3/uL — ABNORMAL HIGH (ref 0.00–0.07)
Basophils Absolute: 0 10*3/uL (ref 0.0–0.1)
Basophils Relative: 0 %
Eosinophils Absolute: 0.1 10*3/uL (ref 0.0–0.5)
Eosinophils Relative: 1 %
HCT: 21.8 % — ABNORMAL LOW (ref 36.0–46.0)
Hemoglobin: 6.8 g/dL — CL (ref 12.0–15.0)
Immature Granulocytes: 1 %
Lymphocytes Relative: 14 %
Lymphs Abs: 1.7 10*3/uL (ref 0.7–4.0)
MCH: 28 pg (ref 26.0–34.0)
MCHC: 31.2 g/dL (ref 30.0–36.0)
MCV: 89.7 fL (ref 80.0–100.0)
Monocytes Absolute: 1.2 10*3/uL — ABNORMAL HIGH (ref 0.1–1.0)
Monocytes Relative: 10 %
Neutro Abs: 9.1 10*3/uL — ABNORMAL HIGH (ref 1.7–7.7)
Neutrophils Relative %: 74 %
Platelets: 145 10*3/uL — ABNORMAL LOW (ref 150–400)
RBC: 2.43 MIL/uL — ABNORMAL LOW (ref 3.87–5.11)
RDW: 18.1 % — ABNORMAL HIGH (ref 11.5–15.5)
WBC: 12.1 10*3/uL — ABNORMAL HIGH (ref 4.0–10.5)
nRBC: 0 % (ref 0.0–0.2)

## 2022-11-13 LAB — PHOSPHORUS: Phosphorus: 2.8 mg/dL (ref 2.5–4.6)

## 2022-11-13 LAB — HEMOGLOBIN AND HEMATOCRIT, BLOOD
HCT: 25.5 % — ABNORMAL LOW (ref 36.0–46.0)
Hemoglobin: 8.1 g/dL — ABNORMAL LOW (ref 12.0–15.0)

## 2022-11-13 LAB — MAGNESIUM: Magnesium: 1.9 mg/dL (ref 1.7–2.4)

## 2022-11-13 LAB — PREPARE RBC (CROSSMATCH)

## 2022-11-13 MED ORDER — SODIUM CHLORIDE 0.9% IV SOLUTION: Freq: Once | INTRAVENOUS | Status: DC

## 2022-11-13 NOTE — Progress Notes (Signed)
     2 Days Post-Op Procedure(s) (LRB): INTRAMEDULLARY (IM) NAIL INTERTROCHANTERIC (Left) Subjective:  POD1 left hip IMN.  Transfused 2 units during surgery.  On coumadin at baseline with elevated PT-INR.  Hx of dementia, per nursing staff and family member reports, patient cognition close to baseline.  Urinating okay.  Per nursing staff, patient to soon get transfused, Hgb low at 6.8.  Patient reports pain as "fine".  Slept okay.  A bit more communicative this morning.  Denies chest pain, ShOB, N/V.  Denies numbness or tingling.  No other complaints at this time.  Objective:   VITALS:   Vitals:   11/12/22 2136 11/13/22 0650 11/13/22 0930 11/13/22 1005  BP: (!) 147/77 137/71 (!) 143/61 131/75  Pulse:  77 74 79  Resp: 14 16 20 20   Temp: 97.9 F (36.6 C) (!) 97.3 F (36.3 C) 97.9 F (36.6 C) 97.9 F (36.6 C)  TempSrc: Oral Oral Oral Axillary  SpO2:   97% 96%  Weight:      Height:        Alert and oriented 1-2 at baseline Intact pulses distally Dorsiflexion/Plantar flexion intact Incision: dressing C/D/I Compartment soft, mild expected post-operative swelling of surgical area Wiggles toes spontaneously and appropriately   Lab Results  Component Value Date   WBC 12.1 (H) 11/13/2022   HGB 6.8 (LL) 11/13/2022   HCT 21.8 (L) 11/13/2022   MCV 89.7 11/13/2022   PLT 145 (L) 11/13/2022   BMET    Component Value Date/Time   NA 131 (L) 11/13/2022 0332   K 3.9 11/13/2022 0332   CL 101 11/13/2022 0332   CO2 23 11/13/2022 0332   GLUCOSE 108 (H) 11/13/2022 0332   BUN 32 (H) 11/13/2022 0332   BUN 12 12/11/2011 0000   CREATININE 1.78 (H) 11/13/2022 0332   CALCIUM 7.7 (L) 11/13/2022 0332   GFRNONAA 28 (L) 11/13/2022 0332     Xray: stable post-operative imaging  Assessment/Plan: 2 Days Post-Op   Principal Problem:   Closed intertrochanteric fracture of left femur, initial encounter (HCC) Active Problems:   Falls frequently   Asymptomatic  bacteriuria  Narrative: 10/24: Dementia close to baseline per staff and reports from family members.  To mobilize with PT today.  PT-INR elevated, plan to hold Lovenox/Coumadin for now, goal to restart once within appropriate range.  Hgb 7.8, stable compared to yesterday, continue to monitor and consider transfusion as needed.  Anticipate SNF upon DC.  10/25: More interactive today.  Hasn't yet ambulated with PT, needs a lot of assistance still.  Low Hgb, plan for transfusion per nursing staff.  PT-INR still elevated, continue hold on DVT prophylaxis.    Post op recs: WB: WBAT LLE Abx: ancef x23 hours post op Imaging: PACU xrays Dressing: keep intact until follow up, change PRN if soiled or saturated. DVT prophylaxis: resume coumadin post op. Follow up: 2 weeks after surgery for a wound check with Dr. Blanchie Dessert at [facility].  Address: [address], [place], [place] [address]  Office Phone: [phone]   Brandi [name] 11/13/2022, 12:46 PM   Contact information:   Weekdays 7am-5pm epic message Dr. Blanchie Dessert, or call office for patient follow up: [phone] After hours and holidays please check [email or web address] for group call information for [facility]

## 2022-11-13 NOTE — Plan of Care (Signed)
  Problem: Nutrition: Goal: Adequate nutrition will be maintained Outcome: Progressing   Problem: Coping: Goal: Level of anxiety will decrease Outcome: Progressing   Problem: Pain Management: Goal: General experience of comfort will improve Outcome: Progressing

## 2022-11-13 NOTE — Progress Notes (Signed)
Received Verbal permission from Pt's husband Elisea Delcour for blood transfusion

## 2022-11-13 NOTE — Progress Notes (Signed)
PROGRESS NOTE    Brandi [name]  ZOX:[id number] DOB: 1940-08-23 DOA: 11/10/2022 PCP: Eloisa Northern, MD    Brief Narrative:  82 year old with history of A-fib and flutter on Coumadin, frequent falls, dementia, recent right pubic ramus fracture that was managed nonoperatively went home 2 weeks ago from SNF, poor mobility status.  Fell 2 nights ago and came to emergency room on 10/22.  She was found to have left intertrochanteric fracture.  She was brought from [facility] to [facility] for OR availability.  INR was reversed.  Subjective:  Patient seen and examined.  She had to have Foley catheter yesterday evening because of recurrent retention.  Today she is more alert awake and interactive.  Denies any complaints at rest.  She does not know she had a fracture on her left hip. Patient was started on 1 unit of PRBC overnight for hemoglobin of 6.7.  INR is 3.6.  There is no evidence of active bleeding.   Assessment & Plan:   Closed traumatic left intertrochanteric fracture of the hip, recent right pubic rami fractures. Status post IM nail intertrochanteric fracture left, Dr. Blanchie Dessert 10/23 Weightbearing as tolerated Adequate pain medications, combination medication regimen none opiates and opiates.  Bowel regimens to avoid constipation. DVT prophylaxis, therapeutic INR.  Pharmacy managing Coumadin. Orthopedics to schedule follow-up in 2 weeks.  AKI on CKD stage IIIb: This is likely due to hemodynamic instability during periprocedure.  Will avoid hypotension.  Discontinue antihypertensives except rate control medications. Creatinine 1.77.  Will monitor closely.  Chronic medical issues including Acute on chronic anemia of blood loss, hemoglobin 8.6-7.8-6.7.Marland Kitchen  Anticipated from procedure and hemodilution.  1 unit PRBC today.  Physical deconditioning, advanced frailty and debility.  Work with PT OT.    Paroxysmal A-fib, hypertension: Coumadin resumed, however not needing today as her  INR is more than 3.  Continue Cardizem and metoprolol.  Hyperlipidemia, on Crestor.  Depression, on sertraline.  Goal of care:  High 4-month mortality with pelvic fracture now with hip fracture and patient with gradual worsening mental status and profound debility.  Palliative following.  She will benefit with outpatient palliative care follow-up after SNF.    DVT prophylaxis: SCDs Start: 11/10/22 2043   Code Status: DNR with limited intervention, documented on admission. Family Communication: None at the bedside Disposition Plan: Status is: Inpatient Remains inpatient appropriate because: Blood transfusions.  Will need to SNF.  Should be ready by tomorrow.     Consultants:  Palliative care Orthopedics  Procedures:  IM nail left hip  Antimicrobials:  Perioperative     Objective: Vitals:   11/12/22 2136 11/13/22 0650 11/13/22 0930 11/13/22 1005  BP: (!) 147/77 137/71 (!) 143/61 131/75  Pulse:  77 74 79  Resp: 14 16 20 20   Temp: 97.9 F (36.6 C) (!) 97.3 F (36.3 C) 97.9 F (36.6 C) 97.9 F (36.6 C)  TempSrc: Oral Oral Oral Axillary  SpO2:   97% 96%  Weight:      Height:        Intake/Output Summary (Last 24 hours) at 11/13/2022 1127 Last data filed at 11/13/2022 0956 Gross per 24 hour  Intake 680 ml  Output 1200 ml  Net -520 ml   Filed Weights   11/10/22 1549 11/11/22 1129  Weight: 65.3 kg 65.3 kg    Examination:  General exam: Patient looks fairly comfortable today.  On room air.  Oriented to herself.  Not oriented to place, time or situation.  Speech is fluent but  with flat affect. Respiratory system: Clear to auscultation.  No added sounds. Cardiovascular system: S1 & S2 heard, RRR.  Gastrointestinal system: Soft.  Nontender.  Bowel sound present. Extremities: Symmetric 5 x 5 power. Left hip  postop.  Lateral thigh incisions with minimal swelling.  Nontender and intact.  Data Reviewed: I have personally reviewed following labs and imaging  studies  CBC: Recent Labs  Lab 11/10/22 1629 11/11/22 0544 11/12/22 0347 11/13/22 0332  WBC 13.8* 12.5* 11.7* 12.1*  NEUTROABS 12.0*  --   --  9.1*  HGB 8.6* 7.8* 7.8* 6.8*  HCT 28.3* 25.2* 25.4* 21.8*  MCV 92.8 91.0 90.4 89.7  PLT 201 191 145* 145*   Basic Metabolic Panel: Recent Labs  Lab 11/10/22 1629 11/11/22 0544 11/12/22 0347 11/13/22 0332  NA 140 139 135 131*  K 3.9 4.1 4.1 3.9  CL 108 105 103 101  CO2 23 23 22 23   GLUCOSE 131* 130* 188* 108*  BUN 20 19 29* 32*  CREATININE 1.34* 1.28* 1.60* 1.78*  CALCIUM 8.7* 8.8* 7.9* 7.7*  MG  --   --   --  1.9  PHOS  --   --   --  2.8   GFR: Estimated Creatinine Clearance: 20 mL/min (A) (by C-G formula based on SCr of 1.78 mg/dL (H)). Liver Function Tests: Recent Labs  Lab 11/10/22 1629 11/12/22 0347  AST 22 19  ALT 19 14  ALKPHOS 83 70  BILITOT 0.6 0.4  PROT 6.1* 5.3*  ALBUMIN 3.0* 2.5*   No results for input(s): "LIPASE", "AMYLASE" in the last 168 hours. No results for input(s): "AMMONIA" in the last 168 hours. Coagulation Profile: Recent Labs  Lab 11/10/22 1629 11/11/22 0544 11/12/22 0347 11/13/22 0332  INR 1.9* 2.2* 3.1* 3.6*   Cardiac Enzymes: No results for input(s): "CKTOTAL", "CKMB", "CKMBINDEX", "TROPONINI" in the last 168 hours. BNP (last 3 results) No results for input(s): "PROBNP" in the last 8760 hours. HbA1C: No results for input(s): "HGBA1C" in the last 72 hours. CBG: No results for input(s): "GLUCAP" in the last 168 hours. Lipid Profile: No results for input(s): "CHOL", "HDL", "LDLCALC", "TRIG", "CHOLHDL", "LDLDIRECT" in the last 72 hours. Thyroid Function Tests: No results for input(s): "TSH", "T4TOTAL", "FREET4", "T3FREE", "THYROIDAB" in the last 72 hours. Anemia Panel: No results for input(s): "VITAMINB12", "FOLATE", "FERRITIN", "TIBC", "IRON", "RETICCTPCT" in the last 72 hours. Sepsis Labs: No results for input(s): "PROCALCITON", "LATICACIDVEN" in the last 168 hours.  Recent  Results (from the past 240 hour(s))  Surgical pcr screen     Status: Abnormal   Collection Time: 11/11/22  6:06 AM   Specimen: Nasal Mucosa; Nasal Swab  Result Value Ref Range Status   MRSA, PCR NEGATIVE NEGATIVE Final   Staphylococcus aureus POSITIVE (A) NEGATIVE Final    Comment: (NOTE) The Xpert SA Assay (FDA approved for NASAL specimens in patients 20 years of age and older), is one component of a comprehensive surveillance program. It is not intended to diagnose infection nor to guide or monitor treatment. Performed at [facility], [address] [place], [place] [address]          Radiology Studies: DG HIP UNILAT W OR W/O PELVIS 2-3 VIEWS LEFT  Result Date: 11/11/2022 CLINICAL DATA:  Postop fracture fixation. EXAM: DG HIP (WITH OR WITHOUT PELVIS) 2-3V LEFT COMPARISON:  Preoperative imaging FINDINGS: Femoral intramedullary nail with trans trochanteric and distal locking screw fixation traverse proximal femur fracture. The subtrochanteric component laterally is new from prior exam.  There are remote fractures of the right pubic rami. IMPRESSION: ORIF of proximal left femur fracture. Electronically Signed   By: Narda Rutherford M.D.   On: 11/11/2022 17:49   DG FEMUR MIN 2 VIEWS LEFT  Result Date: 11/11/2022 CLINICAL DATA:  Elective surgery. EXAM: LEFT FEMUR 2 VIEWS COMPARISON:  Preoperative imaging FINDINGS: Fourteen fluoroscopic spot views of the left femur obtained in the operating room. Sequential images during femoral intramedullary nail with trans trochanteric and distal locking screw fixation traversing proximal femur fracture. Fluoroscopy time 2 minutes 2 seconds. Dose 20.629 mGy. IMPRESSION: Intraoperative fluoroscopy during proximal femur fracture fixation. Electronically Signed   By: Narda Rutherford M.D.   On: 11/11/2022 17:45   DG C-Arm 1-60 Min-No Report  Result Date: 11/11/2022 Fluoroscopy was utilized by the requesting physician.  No radiographic  interpretation.   DG C-Arm 1-60 Min-No Report  Result Date: 11/11/2022 Fluoroscopy was utilized by the requesting physician.  No radiographic interpretation.        Scheduled Meds:  sodium chloride   Intravenous Once   Chlorhexidine Gluconate Cloth  6 each Topical Q0600   diltiazem  60 mg Oral BID   guanFACINE  2 mg Oral QPM   metoprolol succinate  25 mg Oral BID   mupirocin ointment  1 Application Nasal BID   pantoprazole  40 mg Oral Daily   rosuvastatin  10 mg Oral Once per day on Sunday Wednesday   sertraline  75 mg Oral QHS   Continuous Infusions:   LOS: 3 days    Time spent: 40 minutes    Dorcas Carrow, MD [facility]

## 2022-11-13 NOTE — Progress Notes (Signed)
   11/13/22 0551  Provider Notification  Provider Name/Title A.Virgel Manifold NP  Date Provider Notified 11/13/22  Time Provider Notified [phone]  Method of Notification Page (electronic communication)  Notification Reason  (Hgb 6.8)

## 2022-11-13 NOTE — Progress Notes (Signed)
Daily Progress Note   Patient Name: Brandi [name]       Date: 11/13/2022 DOB: 04/14/1940  Age: 82 y.o. MRN#: [id number] Attending Physician: Dorcas Carrow, MD Primary Care Physician: Eloisa Northern, MD Admit Date: 11/10/2022 Length of Stay: 3 days  Reason for Consultation/Follow-up: Establishing goals of care  HPI/Patient Profile:  82 y.o. female  with past medical history of A-fib and flutter on Coumadin, frequent falls, dementia, recent right pubic ramus fracture that was managed nonoperatively went home 2 weeks ago, poor mobility status. Fell 2 nights ago and came to emergency room on 10/22. She was found to have left intertrochanteric fracture. She was admitted on 11/10/2022 with closed traumatic left Entrikin Tarik fracture of the hip, AKI on CKD stage IIIb, and others.    Palliative medicine was consulted for GOC conversations.  Subjective:   Subjective: Chart Reviewed. Updates received. Patient Assessed. Created space and opportunity for patient  and family to explore thoughts and feelings regarding current medical situation.  Today's Discussion: Today saw the patient at bedside, her husband was present although about to leave.  She is more awake and interactive today.  I told the patient about the booklet I left at the bedside for him, found it near the sink and handed it to him to help with discussions with family about goals of care.  I reminded him about her discussion yesterday about the importance of having these discussions as given the patient's recent health struggles we may be near to needing to make decisions.  The patient states that she is feeling fine.  She denies significant pain, although she does have some left hip pain.  I offered to request the nurse to bring pain medication but she states she does not think she needs it now.  I encouraged her to speak up if she is hurting significantly and she agreed.  As of right now the plan is to discharge to SNF rehab status  post surgical repair of fractured hip.  The goal is to eventually be discharged to home with her husband.  I provided emotional and general support through therapeutic listening, empathy, sharing of stories, and other techniques. I answered all questions and addressed all concerns to the best of my ability.  Review of Systems  Respiratory:  Negative for shortness of breath.   Gastrointestinal:  Negative for abdominal pain, nausea and vomiting.  Musculoskeletal:        Left hip pain, currently tolerable    Objective:   Vital Signs:  BP 128/76   Pulse 79   Temp 98.2 F (36.8 C) (Oral)   Resp 18   Ht 4\' 11"  (1.499 m)   Wt 65.3 kg   SpO2 95%   BMI 29.08 kg/m   Physical Exam: Physical Exam Vitals and nursing note reviewed.  Constitutional:      General: She is not in acute distress.    Appearance: She is not ill-appearing.  HENT:     Head: Normocephalic and atraumatic.  Cardiovascular:     Rate and Rhythm: Normal rate.  Pulmonary:     Effort: Pulmonary effort is normal. No respiratory distress.  Abdominal:     General: Abdomen is flat.  Skin:    General: Skin is warm and dry.  Neurological:     Mental Status: She is alert.  Psychiatric:        Mood and Affect: Mood normal.        Behavior: Behavior normal.     Palliative  Assessment/Data: 40%    Existing Vynca/ACP Documentation: None  Assessment & Plan:   Impression: Present on Admission:  Closed intertrochanteric fracture of left femur, initial encounter [facility])  82 year old female with acute presentation chronic comorbidities as described above. At this point she is on and off confused consistent with described history of possible dementia. This is her second fall in the past couple months, both with fractures. Given her frequent falls and changing mental status there is concern for her prognosis over the coming months. I had a good discussion with the patient's husband yesterday who seems understand that it is  a good idea to start discussing advance care planning and goals of care. I reinforced this with him today. I provided information for him to help prompt these discussions with family. He seems to be on board. Long-term prognosis guarded to poor   SUMMARY OF RECOMMENDATIONS   Remain DNR-limited Continue to treat the treatable Anticipate d/c to SNF/Rehab @ [facility] Family hopeful for improvement to be able to eventually return home with support Continue to encourage family discussions on ACP and goals of care Recommend outpatient palliative care follow-up Do not anticipate further palliative needs, but will remain for callback if needed Please notify us of any significant change or new palliative needs  Symptom Management:  Per primary team PMT is available to assist as needed  Code Status: DNR-Limited  Prognosis: Unable to determine  Discharge Planning:  SNF  Discussed with: Patient, family, medical team, nursing team  Thank you for allowing [place] to participate in the care of Sonali Fistler PMT will continue to support holistically.  Time Total: 40 min  Detailed review of medical records (labs, imaging, vital signs), medically appropriate exam, discussed with treatment team, counseling and education to patient, family, & staff, documenting clinical information, medication management, coordination of care  Wynne Dust, NP Palliative Medicine Team  Team Phone # [phone] (Nights/Weekends)  09/17/2020, 8:17 AM

## 2022-11-13 NOTE — Plan of Care (Signed)
  Problem: Nutrition: Goal: Adequate nutrition will be maintained Outcome: Progressing   

## 2022-11-13 NOTE — Progress Notes (Signed)
PHARMACY - ANTICOAGULATION CONSULT NOTE  Pharmacy Consult for warfarin Indication:  hx atrial fibrillation  Allergies  Allergen Reactions   Amoxil [Amoxicillin] Diarrhea   Celebrex [Celecoxib] Other (See Comments)    Bruising and stomach pain    Patient Measurements: Height: 4\' 11"  (149.9 cm) Weight: 65.3 kg (143 lb 15.4 oz) IBW/kg (Calculated) : 43.2 Heparin Dosing Weight:   Vital Signs: Temp: 97.3 F (36.3 C) (10/25 0650) Temp Source: Oral (10/25 0650) BP: 137/71 (10/25 0650) Pulse Rate: 77 (10/25 0650)  Labs: Recent Labs    11/11/22 0544 11/12/22 0347 11/13/22 0332  HGB 7.8* 7.8* 6.8*  HCT 25.2* 25.4* 21.8*  PLT 191 145* 145*  LABPROT 24.8* 32.3* 36.1*  INR 2.2* 3.1* 3.6*  CREATININE 1.28* 1.60* 1.78*    Estimated Creatinine Clearance: 20 mL/min (A) (by C-G formula based on SCr of 1.78 mg/dL (H)).   Medical History: Past Medical History:  Diagnosis Date   Allergy    Atrial fibrillation (HCC)    Dysrhythmia    Hyperlipidemia    Hypertension    Neuromuscular disorder (HCC)    Venous insufficiency     Medications:  - PTA warfarin regimen per [facility]  clinic note on 10/16: 2mg  daily except 1 mg on MWF (last dose taken on 11/09/22)  Assessment: Patient is an 82 y.o F with hx afib on warfarin PTA who presented to the ED on 11/10/22 for evaluation of generalized weakness and s/p fall. Hip xray on 11/10/22 showed left intratrochanteric femoral fracture. She went to the OR on 11/11/22 for left IM nail intertrochanteric. Pharmacy was consulted on 11/12/22 to resume warfarin back for pt.   Significant events: - 10/23: left IM nail intertrochanteric in OR; 1 unit PRBC - 10/25:  hgb down 6.8; 1 unit PRBC ordered   Today, 11/13/2022: - INR continues to trending up with 3.6 this morning despite being off warfarin for the past 3 days.  - hgb down 6.8; 1 unit PRBC ordered  - scr trending up  Goal of Therapy:  INR 2-3 Monitor platelets by anticoagulation protocol:  Yes   Plan:  - continue to hold warfarin for today d/t supra-therapeutic INR - daily INR - monitor for s/sx bleeding  Addysin Porco P 11/13/2022,8:50 AM

## 2022-11-13 NOTE — TOC Progression Note (Signed)
Transition of Care [facility]) - Progression Note   Patient Details  Name: Brandi [name] MRN: [id number] Date of Birth: [birth year]  [facility] CM/SW Contact  Ewing Schlein, LCSW Phone Number: 11/13/2022, 11:17 AM  Clinical Narrative: CSW spoke with patient's husband regarding bed offers:  [facility] [address] [place], [place] [address] [phone] Overall rating?? Below average  [facility] [address] [place], [place] [address] [phone] Overall rating ????? Much above average  Husband chose [facility]. CSW confirmed bed with Bjorn Loser at [facility]. CSW started insurance authorization on [employer] portal, which is pending. Reference ID # is: [id number].  Barriers to Discharge: Continued Medical Work up, English as a second language teacher, SNF Pending bed offer  Expected Discharge Plan and Services In-house Referral: Clinical Social Work Post Acute Care Choice: Skilled Nursing Facility Living arrangements for the past 2 months: Skilled Nursing Facility (was in rehab at [facility] until ~ 2 weeks prior to this admission)  Social Determinants of Health (SDOH) Interventions SDOH Screenings   Food Insecurity: Patient Unable To Answer (11/11/2022)  Housing: Patient Unable To Answer (11/11/2022)  Transportation Needs: Patient Unable To Answer (11/11/2022)  Utilities: Patient Unable To Answer (11/11/2022)  Tobacco Use: Low Risk  (11/11/2022)   Readmission Risk Interventions    11/12/2022    3:06 PM  Readmission Risk Prevention Plan  Transportation Screening Complete  PCP or Specialist Appt within 3-5 Days Complete  HRI or Home Care Consult Complete  Palliative Care Screening Complete  Medication Review (RN Care Manager) Complete

## 2022-11-13 NOTE — Evaluation (Signed)
Occupational Therapy Evaluation Patient Details Name: Brandi [name] MRN: [id number] DOB: 09-Nov-1940 Today's Date: 11/13/2022   History of Present Illness 82 year old with history of A-fib and flutter, frequent falls, dementia; also with admission for recent right superior/inferior pubic ramus fractures that were managed nonoperatively, pt discharged to SNF and then home ~2 weeks ago, poor mobility status.  Pt fell a couple days prior to presenting to emergency room on 11/10/22.  She was found to have left intertrochanteric fracture which is s/p left IM nail on 11/11/22.   Clinical Impression   The pt is currently limited by the below listed deficits, which compromise her ADL performance (see OT problem list). At current, she requires significant physical assistance for tasks such as, lower body dressing, toileting, and bed mobility. Once assisted into sitting EOB, she presented with occasional R sided lean, given increased L hip pain and associated guarding. She was unable to clear her buttocks off the bed, in order to achieve standing. Without further OT services, she is at high risk for restricted ADL participation & progressive weakness. Patient will benefit from continued inpatient follow up therapy, <3 hours/day.       If plan is discharge home, recommend the following: Two people to help with walking and/or transfers;Direct supervision/assist for medications management;Supervision due to cognitive status;Help with stairs or ramp for entrance;A lot of help with bathing/dressing/bathroom    Functional Status Assessment  Patient has had a recent decline in their functional status and demonstrates the ability to make significant improvements in function in a reasonable and predictable amount of time.  Equipment Recommendations  Other (comment) (defer to next level of care)    Recommendations for Other Services       Precautions / Restrictions Precautions Precautions:  Fall Restrictions LLE Weight Bearing: Weight bearing as tolerated Other Position/Activity Restrictions: prior pelvic fractures appear WBAT per 08/26/22 PT evaluation note      Mobility Bed Mobility Overal bed mobility: Needs Assistance Bed Mobility: Supine to Sit, Sit to Supine     Supine to sit: Max assist, HOB elevated, Used rails Sit to supine: Max assist, +2 for physical assistance        Transfers Overall transfer level: Needs assistance Equipment used: Rolling walker (2 wheels) Transfers: Sit to/from Stand             General transfer comment: Sit to stand from EOB using a RW was attempted, however the pt was unable to clear her buttocks off the bed in order to achieve, as she was limited by pain, anxiousness, and guarding of movements      Balance Overall balance assessment: History of Falls     Sitting balance - Comments: CGA to min assist; R sided lean noted           ADL either performed or assessed with clinical judgement   ADL Overall ADL's : Needs assistance/impaired Eating/Feeding: Minimal assistance;Bed level Eating/Feeding Details (indicate cue type and reason): based on clinical judgement Grooming: Minimal assistance;Bed level Grooming Details (indicate cue type and reason): based on clinical judgement         Upper Body Dressing : Moderate assistance;Bed level   Lower Body Dressing: Total assistance Lower Body Dressing Details (indicate cue type and reason): for sock management at bed level     Toileting- Clothing Manipulation and Hygiene: Total assistance;Bed level                Pertinent Vitals/Pain Pain Assessment Pain Assessment: Faces Pain Score: 8  Pain Location: L hip with activity Pain Descriptors / Indicators: Guarding, Grimacing Pain Intervention(s): Limited activity within patient's tolerance, Monitored during session, Repositioned     Extremity/Trunk Assessment Upper Extremity Assessment Upper Extremity  Assessment: Generalized weakness   Lower Extremity Assessment Lower Extremity Assessment: Generalized weakness;LLE deficits/detail LLE Deficits / Details: required assist due to pain; pt with significant guarding and grimacing when LLE moved       Communication     Cognition Arousal: Alert Behavior During Therapy: Flat affect Overall Cognitive Status: History of cognitive impairments - at baseline          General Comments: Oriented to person. Disoriented to time, place, and situation. Required occasional repetition of prompts to follow commands. Impaired memory/recall                Home Living Family/patient expects to be discharged to:: Private residence Living Arrangements: Children   Type of Home: House        Additional Comments: The pt was a questionable historian therefore information contained here could be verified. She reported living with her son in a 3 story home with her bedroom and bathroom on the main level of the home. She reported using a cane & walker for ambulation and being independent with ADLs. Her medical chart indicates, she recently discharged home from SNF rehab.      Prior Functioning/Environment Prior Level of Function : Patient poor historian/Family not available             OT Problem List: Decreased strength;Decreased range of motion;Decreased activity tolerance;Impaired balance (sitting and/or standing);Decreased cognition;Decreased knowledge of use of DME or AE;Decreased knowledge of precautions;Pain      OT Treatment/Interventions: Self-care/ADL training;Therapeutic exercise;Energy conservation;DME and/or AE instruction;Therapeutic activities;Patient/family education;Balance training    OT Goals(Current goals can be found in the care plan section) Acute Rehab OT Goals OT Goal Formulation: Patient unable to participate in goal setting Time For Goal Achievement: 11/27/22 Potential to Achieve Goals: Fair ADL Goals Pt Will Perform  Grooming: with set-up;sitting;with supervision Pt Will Perform Upper Body Dressing: with supervision;with set-up;sitting Pt Will Transfer to Toilet: with min assist;stand pivot transfer;bedside commode Additional ADL Goal #1: The pt will perform bed mobility with min assist, to prep her progressive ADL participation.  OT Frequency: Min 1X/week       AM-PAC OT "6 Clicks" Daily Activity     Outcome Measure Help from another person eating meals?: A Little Help from another person taking care of personal grooming?: A Little Help from another person toileting, which includes using toliet, bedpan, or urinal?: Total Help from another person bathing (including washing, rinsing, drying)?: A Lot Help from another person to put on and taking off regular upper body clothing?: A Lot Help from another person to put on and taking off regular lower body clothing?: Total 6 Click Score: 12   End of Session Equipment Utilized During Treatment: Rolling walker (2 wheels) Nurse Communication: Mobility status  Activity Tolerance: Patient limited by pain Patient left: in bed;with call bell/phone within reach;with bed alarm set  OT Visit Diagnosis: Muscle weakness (generalized) (M62.81);History of falling (Z91.81);Pain Pain - Right/Left: Left Pain - part of body: Hip                Time: 1441-1501 OT Time Calculation (min): 20 min Charges:  OT General Charges $OT Visit: 1 Visit OT Evaluation $OT Eval Moderate Complexity: 1 Mod    Cordaryl Decelles L Zalman Hull, OTR/L 11/13/2022, 3:52 PM

## 2022-11-14 DIAGNOSIS — S72142A Displaced intertrochanteric fracture of left femur, initial encounter for closed fracture: Secondary | ICD-10-CM | POA: Diagnosis not present

## 2022-11-14 LAB — CBC WITH DIFFERENTIAL/PLATELET
Abs Immature Granulocytes: 0.07 10*3/uL (ref 0.00–0.07)
Basophils Absolute: 0 10*3/uL (ref 0.0–0.1)
Basophils Relative: 0 %
Eosinophils Absolute: 0.4 10*3/uL (ref 0.0–0.5)
Eosinophils Relative: 4 %
HCT: 29.6 % — ABNORMAL LOW (ref 36.0–46.0)
Hemoglobin: 9.3 g/dL — ABNORMAL LOW (ref 12.0–15.0)
Immature Granulocytes: 1 %
Lymphocytes Relative: 14 %
Lymphs Abs: 1.4 10*3/uL (ref 0.7–4.0)
MCH: 28.3 pg (ref 26.0–34.0)
MCHC: 31.4 g/dL (ref 30.0–36.0)
MCV: 90 fL (ref 80.0–100.0)
Monocytes Absolute: 1 10*3/uL (ref 0.1–1.0)
Monocytes Relative: 10 %
Neutro Abs: 7.1 10*3/uL (ref 1.7–7.7)
Neutrophils Relative %: 71 %
Platelets: 177 10*3/uL (ref 150–400)
RBC: 3.29 MIL/uL — ABNORMAL LOW (ref 3.87–5.11)
RDW: 16.6 % — ABNORMAL HIGH (ref 11.5–15.5)
WBC: 9.9 10*3/uL (ref 4.0–10.5)
nRBC: 0 % (ref 0.0–0.2)

## 2022-11-14 LAB — PROTIME-INR
INR: 2.2 — ABNORMAL HIGH (ref 0.8–1.2)
Prothrombin Time: 24.4 s — ABNORMAL HIGH (ref 11.4–15.2)

## 2022-11-14 LAB — BASIC METABOLIC PANEL
Anion gap: 10 (ref 5–15)
BUN: 25 mg/dL — ABNORMAL HIGH (ref 8–23)
CO2: 23 mmol/L (ref 22–32)
Calcium: 8.6 mg/dL — ABNORMAL LOW (ref 8.9–10.3)
Chloride: 106 mmol/L (ref 98–111)
Creatinine, Ser: 1.34 mg/dL — ABNORMAL HIGH (ref 0.44–1.00)
GFR, Estimated: 40 mL/min — ABNORMAL LOW (ref >60)
Glucose, Bld: 117 mg/dL — ABNORMAL HIGH (ref 70–99)
Potassium: 3.7 mmol/L (ref 3.5–5.1)
Sodium: 139 mmol/L (ref 135–145)

## 2022-11-14 MED ORDER — WARFARIN - PHARMACIST DOSING INPATIENT: Freq: Every day | Status: DC

## 2022-11-14 MED ORDER — WARFARIN SODIUM 2 MG PO TABS
2.0000 mg | ORAL_TABLET | Freq: Once | ORAL | Status: AC
  Administered 2022-11-14: 2 mg via ORAL
  Filled 2022-11-14: qty 1

## 2022-11-14 NOTE — Progress Notes (Signed)
PROGRESS NOTE    Brandi [name]  ION:[id number] DOB: 07/11/1940 DOA: 11/10/2022 PCP: Eloisa Northern, MD    Brief Narrative:  82 year old with history of A-fib and flutter on Coumadin, frequent falls, dementia, recent right pubic ramus fracture that was managed nonoperatively went home 2 weeks ago from SNF, poor mobility status.  Fell 2 nights ago and came to emergency room on 10/22.  She was found to have left intertrochanteric fracture.  She was brought from [facility] to [facility] for OR availability.  INR was reversed.  Subjective:  Patient was seen and examined.  No overnight events.  She remains pretty comfortable today at rest. Hemoglobin is adequate.  INR is 2.2.  Patient waiting for insurance prior authorization.   Assessment & Plan:   Closed traumatic left intertrochanteric fracture of the hip, recent right pubic rami fractures. Status post IM nail intertrochanteric fracture left, Dr. Blanchie Dessert 10/23 Weightbearing as tolerated Adequate pain medications, combination medication regimen non-opiates and opiates.  Bowel regimens to avoid constipation. DVT prophylaxis, therapeutic INR.  Pharmacy managing Coumadin. Orthopedics to schedule follow-up in 2 weeks.  AKI on CKD stage IIIb: This is likely due to hemodynamic instability during periprocedure.  Will avoid hypotension.  Discontinue antihypertensives except rate control medications. Creatinine stabilized back to herself.   Chronic medical issues including Acute on chronic anemia of blood loss, hemoglobin 8.6-7.8-6.7-1 unit of PRBC.  Appropriately responded.  More than 9 today.  Physical deconditioning, advanced frailty and debility.  Work with PT OT.    Paroxysmal A-fib, hypertension: Coumadin resumed, continue Cardizem and metoprolol.  Hyperlipidemia, on Crestor.  Depression, on sertraline.  Goal of care:  High 49-month mortality with pelvic fracture now with hip fracture and patient with gradual worsening  mental status and profound debility.  Palliative following.  She will benefit with outpatient palliative care follow-up after SNF.  Disposition: Patient has bed at [facility].  Insurance required peer to peer review and demanded working with PT again today before providing authorization to go to SNF.    DVT prophylaxis: SCDs Start: 11/10/22 2043 warfarin (COUMADIN) tablet 2 mg   Code Status: DNR with limited intervention, documented on admission. Family Communication: None at the bedside Disposition Plan: Status is: Inpatient Remains inpatient appropriate because: Medically ready to go to SNF.     Consultants:  Palliative care Orthopedics  Procedures:  IM nail left hip  Antimicrobials:  Perioperative     Objective: Vitals:   11/13/22 1245 11/13/22 1427 11/13/22 2218 11/14/22 0625  BP: 128/76 117/62 (!) 149/73 136/78  Pulse:  74 71 92  Resp: 18 16 16 14   Temp: 98.2 F (36.8 C) 98.3 F (36.8 C) 98.1 F (36.7 C) (!) 97.4 F (36.3 C)  TempSrc: Oral  Oral Oral  SpO2: 95% 99% 100% 99%  Weight:      Height:        Intake/Output Summary (Last 24 hours) at 11/14/2022 1255 Last data filed at 11/14/2022 1215 Gross per 24 hour  Intake 360 ml  Output 1775 ml  Net -1415 ml   Filed Weights   11/10/22 1549 11/11/22 1129  Weight: 65.3 kg 65.3 kg    Examination:  General exam: Patient looks fairly comfortable today.   Respiratory system: Clear to auscultation.  No added sounds. Cardiovascular system: S1 & S2 heard, RRR.  Gastrointestinal system: Soft.  Nontender.  Bowel sound present. Extremities: Symmetric 5 x 5 power. Left hip  postop.  Lateral thigh incisions with minimal swelling.  Nontender  and intact.  Data Reviewed: I have personally reviewed following labs and imaging studies  CBC: Recent Labs  Lab 11/10/22 1629 11/11/22 0544 11/12/22 0347 11/13/22 0332 11/13/22 1441 11/14/22 0349  WBC 13.8* 12.5* 11.7* 12.1*  --  9.9  NEUTROABS 12.0*  --   --   9.1*  --  7.1  HGB 8.6* 7.8* 7.8* 6.8* 8.1* 9.3*  HCT 28.3* 25.2* 25.4* 21.8* 25.5* 29.6*  MCV 92.8 91.0 90.4 89.7  --  90.0  PLT 201 191 145* 145*  --  177   Basic Metabolic Panel: Recent Labs  Lab 11/10/22 1629 11/11/22 0544 11/12/22 0347 11/13/22 0332 11/14/22 0349  NA 140 139 135 131* 139  K 3.9 4.1 4.1 3.9 3.7  CL 108 105 103 101 106  CO2 23 23 22 23 23   GLUCOSE 131* 130* 188* 108* 117*  BUN 20 19 29* 32* 25*  CREATININE 1.34* 1.28* 1.60* 1.78* 1.34*  CALCIUM 8.7* 8.8* 7.9* 7.7* 8.6*  MG  --   --   --  1.9  --   PHOS  --   --   --  2.8  --    GFR: Estimated Creatinine Clearance: 26.6 mL/min (A) (by C-G formula based on SCr of 1.34 mg/dL (H)). Liver Function Tests: Recent Labs  Lab 11/10/22 1629 11/12/22 0347  AST 22 19  ALT 19 14  ALKPHOS 83 70  BILITOT 0.6 0.4  PROT 6.1* 5.3*  ALBUMIN 3.0* 2.5*   No results for input(s): "LIPASE", "AMYLASE" in the last 168 hours. No results for input(s): "AMMONIA" in the last 168 hours. Coagulation Profile: Recent Labs  Lab 11/10/22 1629 11/11/22 0544 11/12/22 0347 11/13/22 0332 11/14/22 0349  INR 1.9* 2.2* 3.1* 3.6* 2.2*   Cardiac Enzymes: No results for input(s): "CKTOTAL", "CKMB", "CKMBINDEX", "TROPONINI" in the last 168 hours. BNP (last 3 results) No results for input(s): "PROBNP" in the last 8760 hours. HbA1C: No results for input(s): "HGBA1C" in the last 72 hours. CBG: No results for input(s): "GLUCAP" in the last 168 hours. Lipid Profile: No results for input(s): "CHOL", "HDL", "LDLCALC", "TRIG", "CHOLHDL", "LDLDIRECT" in the last 72 hours. Thyroid Function Tests: No results for input(s): "TSH", "T4TOTAL", "FREET4", "T3FREE", "THYROIDAB" in the last 72 hours. Anemia Panel: No results for input(s): "VITAMINB12", "FOLATE", "FERRITIN", "TIBC", "IRON", "RETICCTPCT" in the last 72 hours. Sepsis Labs: No results for input(s): "PROCALCITON", "LATICACIDVEN" in the last 168 hours.  Recent Results (from the past  240 hour(s))  Surgical pcr screen     Status: Abnormal   Collection Time: 11/11/22  6:06 AM   Specimen: Nasal Mucosa; Nasal Swab  Result Value Ref Range Status   MRSA, PCR NEGATIVE NEGATIVE Final   Staphylococcus aureus POSITIVE (A) NEGATIVE Final    Comment: (NOTE) The Xpert SA Assay (FDA approved for NASAL specimens in patients 42 years of age and older), is one component of a comprehensive surveillance program. It is not intended to diagnose infection nor to guide or monitor treatment. Performed at [facility], [address] [place], [place] [address]          Radiology Studies: No results found.      Scheduled Meds:  sodium chloride   Intravenous Once   Chlorhexidine Gluconate Cloth  6 each Topical Q0600   diltiazem  60 mg Oral BID   guanFACINE  2 mg Oral QPM   metoprolol succinate  25 mg Oral BID   mupirocin ointment  1 Application Nasal BID  pantoprazole  40 mg Oral Daily   rosuvastatin  10 mg Oral Once per day on Sunday Wednesday   sertraline  75 mg Oral QHS   warfarin  2 mg Oral ONCE-1600   Warfarin - Pharmacist Dosing Inpatient   Does not apply q1600   Continuous Infusions:   LOS: 4 days    Time spent: 35 minutes    Dorcas Carrow, MD [facility]

## 2022-11-14 NOTE — Care Management (Signed)
Called insurance company to do peer to peer review to place patient to [facility]. Discussed with insurance physician. Insurance needs updated therapy notes and we will have to resubmit it by Monday 12 PM

## 2022-11-14 NOTE — Plan of Care (Signed)
Pt d/c to SNF. Valuables returned, IV removed. AVS included in packet. BP 136/78 (BP Location: Left Arm)   Pulse 92   Temp (!) 97.4 F (36.3 C) (Oral)   Resp 14   Ht 4\' 11"  (1.499 m)   Wt 65.3 kg   SpO2 99%   BMI 29.08 kg/m  Reva Bores 11/14/22 1:05 PM

## 2022-11-14 NOTE — Progress Notes (Signed)
Physical Therapy Treatment Patient Details Name: Brandi [name] MRN: [id number] DOB: Oct 08, 1940 Today's Date: 11/14/2022   History of Present Illness 82 year old with history of A-fib and flutter on Coumadin, frequent falls, dementia; also with admission for recent right sup/inf pubic ramus fractures that were managed nonoperatively, pt discharged to SNF and then home 2 weeks ago, poor mobility status.  Pt fell a couple days prior to presenting to emergency room on 10/22.  She was found to have left intertrochanteric fracture which is s/p left IM nail on 11/11/22.    PT Comments  Pt assisted with sitting EOB and attempted standing.  Pt unable to rise from bed surface sitting EOB with RW.  Pt had just been attempting to use [facility] with nursing prior to therapist arrival so anticipate fatigue increased assist level (as well as pain).  Patient will benefit from continued inpatient follow up therapy, <3 hours/day.    If plan is discharge home, recommend the following: Two people to help with walking and/or transfers;Two people to help with bathing/dressing/bathroom   Can travel by private vehicle     No  Equipment Recommendations  None recommended by PT    Recommendations for Other Services       Precautions / Restrictions Precautions Precautions: Fall Restrictions Weight Bearing Restrictions: No RLE Weight Bearing: Non weight bearing LLE Weight Bearing: Weight bearing as tolerated Other Position/Activity Restrictions: prior pelvic fractures appear WBAT per 08/26/22 PT evaluation note     Mobility  Bed Mobility Overal bed mobility: Needs Assistance Bed Mobility: Supine to Sit, Sit to Supine     Supine to sit: Max assist, HOB elevated, Used rails Sit to supine: Max assist, +2 for physical assistance   General bed mobility comments: multimodal cues for technique, pt required assist to inch LEs over EOB and for trunk upright, assist for LEs onto bed and repositioning in supine     Transfers Overall transfer level: Needs assistance Equipment used: Rolling walker (2 wheels) Transfers: Sit to/from Stand Sit to Stand: Total assist           General transfer comment: attempted sit to stand however pt unable to clear buttocks from bed surface with assist    Ambulation/Gait                   Stairs             Wheelchair Mobility     Tilt Bed    Modified Rankin (Stroke Patients Only)       Balance Overall balance assessment: History of Falls                                          Cognition Arousal: Alert Behavior During Therapy: Flat affect Overall Cognitive Status: History of cognitive impairments - at baseline                                 General Comments: provides mostly one word answers, poor historian, hx dementia        Exercises      General Comments        Pertinent Vitals/Pain Pain Assessment Pain Assessment: PAINAD Breathing: normal Negative Vocalization: occasional moan/groan, low speech, negative/disapproving quality Facial Expression: facial grimacing Body Language: relaxed Consolability: distracted or reassured by voice/touch PAINAD Score: 4 Pain Location: L  hip/thigh with activity Pain Descriptors / Indicators: Guarding, Grimacing Pain Intervention(s): Monitored during session, Repositioned    Home Living Family/patient expects to be discharged to:: Skilled nursing facility Living Arrangements: Other (Comment)                      Prior Function            PT Goals (current goals can now be found in the care plan section) Progress towards PT goals: Progressing toward goals    Frequency    Min 1X/week      PT Plan      Co-evaluation              AM-PAC PT "6 Clicks" Mobility   Outcome Measure  Help needed turning from your back to your side while in a flat bed without using bedrails?: Total Help needed moving from lying on your  back to sitting on the side of a flat bed without using bedrails?: Total Help needed moving to and from a bed to a chair (including a wheelchair)?: Total Help needed standing up from a chair using your arms (e.g., wheelchair or bedside chair)?: Total Help needed to walk in hospital room?: Total Help needed climbing 3-5 steps with a railing? : Total 6 Click Score: 6    End of Session Equipment Utilized During Treatment: Gait belt Activity Tolerance: Patient limited by pain;Patient limited by fatigue Patient left: in bed;with bed alarm set;with call bell/phone within reach   PT Visit Diagnosis: Difficulty in walking, not elsewhere classified (R26.2);Pain Pain - Right/Left: Left Pain - part of body: Hip     Time: 9562-1308 PT Time Calculation (min) (ACUTE ONLY): 20 min  Charges:    $Therapeutic Activity: 8-22 mins PT General Charges $$ ACUTE PT VISIT: 1 Visit                     Paulino Door, DPT Physical Therapist Acute Rehabilitation Services Office: [phone]    Janan Halter Payson 11/14/2022, 3:19 PM

## 2022-11-14 NOTE — TOC Progression Note (Addendum)
Transition of Care [facility]) - Progression Note    Patient Details  Name: Brandi [name] MRN: [id number] Date of Birth: 06-13-1940  Transition of Care [facility]) CM/SW Contact  Darleene Cleaver, [place] Phone Number: 11/14/2022, 10:55 AM  Clinical Narrative:     CSW contacted patient's insurance company [employer] reference number [phone], to get an update on insurance authorization for [facility].  Per [employer] they are requesting a Peer to peer, attending physician to call [phone] option 5. Provide name of patient, date of birth, and plan id number [id number]. Deadline Monday October 28th at [employer] time.  CSW notified attending physician.  11:30am Physician completed a peer to peer, per [employer], they would like updated PT notes in order to reevaluate if they will approve patient for SNF placement at [facility].  CSW notified PT, they will attempt to see her today.  Once PT sees her, insurance will need updated therapy notes sent to them by Monday October 28th by 12pm.  CSW awaiting for PT to see patient again.  TOC to continue to follow patient's progress throughout discharge planning.    Barriers to Discharge: Continued Medical Work up, English as a second language teacher, SNF Pending bed offer  Expected Discharge Plan and Services In-house Referral: Clinical Social Work   Post Acute Care Choice: Skilled Nursing Facility Living arrangements for the past 2 months: Skilled Nursing Facility (was in rehab at [facility] until ~ 2 weeks prior to this admission) Expected Discharge Date: 11/14/22                                     Social Determinants of Health (SDOH) Interventions SDOH Screenings   Food Insecurity: Patient Unable To Answer (11/11/2022)  Housing: Patient Unable To Answer (11/11/2022)  Transportation Needs: Patient Unable To Answer (11/11/2022)  Utilities: Patient Unable To Answer (11/11/2022)  Tobacco Use: Low Risk  (11/11/2022)     Readmission Risk Interventions    11/12/2022    3:06 PM  Readmission Risk Prevention Plan  Transportation Screening Complete  PCP or Specialist Appt within 3-5 Days Complete  HRI or Home Care Consult Complete  Palliative Care Screening Complete  Medication Review (RN Care Manager) Complete

## 2022-11-14 NOTE — Progress Notes (Signed)
PHARMACY - ANTICOAGULATION CONSULT NOTE  Pharmacy Consult for warfarin Indication:  hx atrial fibrillation  Allergies  Allergen Reactions   Amoxil [Amoxicillin] Diarrhea   Celebrex [Celecoxib] Other (See Comments)    Bruising and stomach pain    Patient Measurements: Height: 4\' 11"  (149.9 cm) Weight: 65.3 kg (143 lb 15.4 oz) IBW/kg (Calculated) : 43.2  Vital Signs: Temp: 97.4 F (36.3 C) (10/26 0625) Temp Source: Oral (10/26 0625) BP: 136/78 (10/26 0625) Pulse Rate: 92 (10/26 0625)  Labs: Recent Labs    11/12/22 0347 11/13/22 0332 11/13/22 1441 11/14/22 0349  HGB 7.8* 6.8* 8.1* 9.3*  HCT 25.4* 21.8* 25.5* 29.6*  PLT 145* 145*  --  177  LABPROT 32.3* 36.1*  --  24.4*  INR 3.1* 3.6*  --  2.2*  CREATININE 1.60* 1.78*  --  1.34*    Estimated Creatinine Clearance: 26.6 mL/min (A) (by C-G formula based on SCr of 1.34 mg/dL (H)).   Medical History: Past Medical History:  Diagnosis Date   Allergy    Atrial fibrillation (HCC)    Dysrhythmia    Hyperlipidemia    Hypertension    Neuromuscular disorder (HCC)    Venous insufficiency     Medications:  - PTA warfarin regimen per [facility]  clinic note on 10/16:  -2mg  daily except 1 mg on MWF. INR 1.3 at that appointment -last dose taken PTA on 11/09/22  Assessment: Patient is an 82 y.o F with hx afib on warfarin PTA who presented to the ED on 11/10/22 for evaluation of generalized weakness and s/p fall. Hip xray on 11/10/22 showed left intratrochanteric femoral fracture. She went to the OR on 11/11/22 for left IM nail intertrochanteric. Pharmacy was consulted on 11/12/22 to resume warfarin.  INR on admission: 1.9  Significant events: - 10/23: left IM nail intertrochanteric in OR; 1 unit PRBC - 10/25:  hgb down 6.8; 1 unit PRBC ordered  - Warfarin held from 10/22 - 10/25  Today, 11/14/2022: INR = 2.2 is therapeutic  CBC: Hgb 9.3 s/p transfusion, Plt WNL Diet: Dysphagia III, 100% meal intake charted.  No major DDI    Goal of Therapy:  INR 2-3 Monitor platelets by anticoagulation protocol: Yes   Plan:  Warfarin 2 mg PO today CBC, INR with AM labs tomorrow  If patient discharges over the weekend, would recommend to resume home dose with anticoagulation clinic/INR follow up next week.   Cindi Carbon, PharmD 11/14/2022,11:46 AM

## 2022-11-15 DIAGNOSIS — S72142A Displaced intertrochanteric fracture of left femur, initial encounter for closed fracture: Secondary | ICD-10-CM | POA: Diagnosis not present

## 2022-11-15 LAB — CBC
HCT: 29.3 % — ABNORMAL LOW (ref 36.0–46.0)
Hemoglobin: 9.1 g/dL — ABNORMAL LOW (ref 12.0–15.0)
MCH: 28.3 pg (ref 26.0–34.0)
MCHC: 31.1 g/dL (ref 30.0–36.0)
MCV: 91 fL (ref 80.0–100.0)
Platelets: 177 10*3/uL (ref 150–400)
RBC: 3.22 MIL/uL — ABNORMAL LOW (ref 3.87–5.11)
RDW: 15.9 % — ABNORMAL HIGH (ref 11.5–15.5)
WBC: 8.7 10*3/uL (ref 4.0–10.5)
nRBC: 0 % (ref 0.0–0.2)

## 2022-11-15 LAB — PROTIME-INR
INR: 1.6 — ABNORMAL HIGH (ref 0.8–1.2)
Prothrombin Time: 19.2 s — ABNORMAL HIGH (ref 11.4–15.2)

## 2022-11-15 MED ORDER — BISACODYL 10 MG RE SUPP
10.0000 mg | Freq: Once | RECTAL | Status: AC
  Administered 2022-11-15: 10 mg via RECTAL
  Filled 2022-11-15: qty 1

## 2022-11-15 MED ORDER — POLYETHYLENE GLYCOL 3350 17 G PO PACK
17.0000 g | PACK | Freq: Every day | ORAL | Status: DC
  Administered 2022-11-15: 17 g via ORAL
  Filled 2022-11-15 (×2): qty 1

## 2022-11-15 MED ORDER — WARFARIN SODIUM 2 MG PO TABS
2.0000 mg | ORAL_TABLET | Freq: Once | ORAL | Status: AC
  Administered 2022-11-15: 2 mg via ORAL
  Filled 2022-11-15: qty 1

## 2022-11-15 NOTE — Progress Notes (Signed)
Physical Therapy Treatment Patient Details Name: Brandi [name] MRN: [id number] DOB: Nov 03, 1940 Today's Date: 11/15/2022   History of Present Illness 82 year old with history of A-fib and flutter on Coumadin, frequent falls, dementia; also with admission for recent right sup/inf pubic ramus fractures that were managed nonoperatively, pt discharged to SNF and then home 2 weeks ago, poor mobility status.  Pt fell a couple days prior to presenting to emergency room on 10/22.  She was found to have left intertrochanteric fracture which is s/p left IM nail on 11/11/22.    PT Comments  Pt sitting in recliner on arrival.  Pt assisted to [facility] and then back to bed.  Pt requiring increased time and multimodal cues for technique.  Pt more awake/alert and conversant today then previous sessions.  Pt requiring increased assist due to pain and weakness.  Pt assisted with transfer and did attempt to take some steps backwards but unable to tolerate due to pain and assisted safely back to supine.  Current d/c plan remains appropriate.  Patient will benefit from continued inpatient follow up therapy, <3 hours/day.     If plan is discharge home, recommend the following: Two people to help with walking and/or transfers;Two people to help with bathing/dressing/bathroom   Can travel by private vehicle     No  Equipment Recommendations  None recommended by PT    Recommendations for Other Services       Precautions / Restrictions Precautions Precautions: Fall Restrictions Weight Bearing Restrictions: No LLE Weight Bearing: Weight bearing as tolerated Other Position/Activity Restrictions: prior pelvic fractures appear WBAT per 08/26/22 PT evaluation note     Mobility  Bed Mobility Overal bed mobility: Needs Assistance Bed Mobility: Sit to Supine       Sit to supine: Max assist, +2 for physical assistance   General bed mobility comments: multimodal cues for technique, assist for LEs onto bed and  repositioning in supine    Transfers Overall transfer level: Needs assistance Equipment used: Rolling walker (2 wheels) Transfers: Sit to/from Stand, Bed to chair/wheelchair/BSC Sit to Stand: Mod assist, +2 physical assistance   Step pivot transfers: Mod assist, +2 physical assistance       General transfer comment: multimodal cues for positioning and weight shifting; required assist to advance/place left LE for close transfer bed to [facility], pt then assisted with standing from [facility] and max cues for taking steps backwards to bed leading with "good" LE however pt with poor ability to tolerate weight on Lt LE (even with cues for WBing on RW with UEs) to take step and only tolerated 2 steps so bed brought behind pt to safely return to sitting    Ambulation/Gait                   Stairs             Wheelchair Mobility     Tilt Bed    Modified Rankin (Stroke Patients Only)       Balance Overall balance assessment: History of Falls                                          Cognition Arousal: Alert Behavior During Therapy: Flat affect Overall Cognitive Status: History of cognitive impairments - at baseline  General Comments: more talkative today but slow to mobilize due to pain, hx dementia        Exercises      General Comments        Pertinent Vitals/Pain Pain Assessment Pain Assessment: PAINAD Faces Pain Scale: Hurts whole lot Pain Location: L hip/thigh with activity Pain Descriptors / Indicators: Guarding, Grimacing Pain Intervention(s): Repositioned, Monitored during session    Home Living                          Prior Function            PT Goals (current goals can now be found in the care plan section) Progress towards PT goals: Progressing toward goals    Frequency    Min 1X/week      PT Plan      Co-evaluation              AM-PAC PT "6 Clicks"  Mobility   Outcome Measure  Help needed turning from your back to your side while in a flat bed without using bedrails?: Total Help needed moving from lying on your back to sitting on the side of a flat bed without using bedrails?: Total Help needed moving to and from a bed to a chair (including a wheelchair)?: Total Help needed standing up from a chair using your arms (e.g., wheelchair or bedside chair)?: Total Help needed to walk in hospital room?: Total Help needed climbing 3-5 steps with a railing? : Total 6 Click Score: 6    End of Session Equipment Utilized During Treatment: Gait belt Activity Tolerance: Patient limited by pain;Patient limited by fatigue Patient left: in bed;with call bell/phone within reach;with bed alarm set   PT Visit Diagnosis: Difficulty in walking, not elsewhere classified (R26.2);Pain Pain - Right/Left: Left Pain - part of body: Hip     Time: 1401-1431 PT Time Calculation (min) (ACUTE ONLY): 30 min  Charges:    $Therapeutic Activity: 23-37 mins PT General Charges $$ ACUTE PT VISIT: 1 Visit                     Thomasene Mohair PT, DPT Physical Therapist Acute Rehabilitation Services Office: [phone]    Janan Halter Payson 11/15/2022, 4:41 PM

## 2022-11-15 NOTE — Plan of Care (Signed)
  Problem: Activity: Goal: Risk for activity intolerance will decrease Outcome: Progressing   Problem: Coping: Goal: Level of anxiety will decrease Outcome: Progressing   Problem: Pain Management: Goal: General experience of comfort will improve Outcome: Progressing   Problem: Skin Integrity: Goal: Risk for impaired skin integrity will decrease Outcome: Progressing

## 2022-11-15 NOTE — Progress Notes (Signed)
PROGRESS NOTE    Brandi [name]  ZOX:[id number] DOB: March 15, 1940 DOA: 11/10/2022 PCP: Eloisa Northern, MD    Brief Narrative:  82 year old with history of A-fib and flutter on Coumadin, frequent falls, dementia, recent right pubic ramus fracture that was managed nonoperatively went home 2 weeks ago from SNF, poor mobility status.  Fell 2 nights ago and came to emergency room on 10/22.  She was found to have left intertrochanteric fracture.  She was brought from [facility] to [place] for OR availability.  INR was reversed.  Subjective:  Patient seen and examined.  She is more alert and interactive today.  Denies any pain. Hemoglobin is adequate.  INR is 1.6.  Patient waiting for insurance prior authorization.   Assessment & Plan:   Closed traumatic left intertrochanteric fracture of the hip, recent right pubic rami fractures. Status post IM nail intertrochanteric fracture left, Dr. Blanchie Dessert 10/23 Weightbearing as tolerated Adequate pain medications, combination medication regimen non-opiates and opiates.  Bowel regimens to avoid constipation. DVT prophylaxis, therapeutic INR.  Pharmacy managing Coumadin. Orthopedics to schedule follow-up in 2 weeks.  AKI on CKD stage IIIb: This is likely due to hemodynamic instability during periprocedure.  Will avoid hypotension.  Discontinue antihypertensives except rate control medications. Creatinine stabilized back to herself.   Chronic medical issues including Acute on chronic anemia of blood loss, hemoglobin 8.6-7.8-6.7-1 unit of PRBC.  Appropriately responded.  More than 9 today.  Physical deconditioning, advanced frailty and debility.  Work with PT OT.    Paroxysmal A-fib, hypertension: Coumadin resumed, continue Cardizem and metoprolol.  Pharmacy redosing Coumadin.  She can go back on home dose of Coumadin.  Hyperlipidemia, on Crestor.  Depression, on sertraline.  Goal of care:  High 67-month mortality with pelvic fracture now  with hip fracture and patient with gradual worsening mental status and profound debility.  Palliative following.  She will benefit with outpatient palliative care follow-up after SNF.  Disposition: Patient has bed at [facility]. Waiting for insurance authorization.  Peer-to-peer done with insurance physician 10/26.     DVT prophylaxis: SCDs Start: 11/10/22 2043 warfarin (COUMADIN) tablet 2 mg   Code Status: DNR with limited intervention, documented on admission. Family Communication: None at the bedside Disposition Plan: Status is: Inpatient Remains inpatient appropriate because: Medically ready to go to SNF.     Consultants:  Palliative care Orthopedics  Procedures:  IM nail left hip  Antimicrobials:  Perioperative     Objective: Vitals:   11/14/22 0625 11/14/22 1439 11/14/22 2100 11/15/22 0609  BP: 136/78 (!) 139/99 124/78 126/67  Pulse: 92 72 64 67  Resp: 14 16 18 16   Temp: (!) 97.4 F (36.3 C) 98.2 F (36.8 C) (!) 97.4 F (36.3 C) 98.7 F (37.1 C)  TempSrc: Oral   Oral  SpO2: 99% 98% 97% 98%  Weight:      Height:        Intake/Output Summary (Last 24 hours) at 11/15/2022 1118 Last data filed at 11/15/2022 0943 Gross per 24 hour  Intake 470 ml  Output 1050 ml  Net -580 ml   Filed Weights   11/10/22 1549 11/11/22 1129  Weight: 65.3 kg 65.3 kg    Examination:  General exam: Looks fairly comfortable. Respiratory system: No added sounds. Cardiovascular system: S1 & S2 heard, RRR.  Gastrointestinal system: Soft.  Nontender.  Bowel sound present. Extremities: Symmetric 5 x 5 power. Left hip lateral thigh incisions clean and dry.  Mild swelling present.  Data Reviewed: I  have personally reviewed following labs and imaging studies  CBC: Recent Labs  Lab 11/10/22 1629 11/11/22 0544 11/12/22 0347 11/13/22 0332 11/13/22 1441 11/14/22 0349 11/15/22 0356  WBC 13.8* 12.5* 11.7* 12.1*  --  9.9 8.7  NEUTROABS 12.0*  --   --  9.1*  --  7.1  --    HGB 8.6* 7.8* 7.8* 6.8* 8.1* 9.3* 9.1*  HCT 28.3* 25.2* 25.4* 21.8* 25.5* 29.6* 29.3*  MCV 92.8 91.0 90.4 89.7  --  90.0 91.0  PLT 201 191 145* 145*  --  177 177   Basic Metabolic Panel: Recent Labs  Lab 11/10/22 1629 11/11/22 0544 11/12/22 0347 11/13/22 0332 11/14/22 0349  NA 140 139 135 131* 139  K 3.9 4.1 4.1 3.9 3.7  CL 108 105 103 101 106  CO2 23 23 22 23 23   GLUCOSE 131* 130* 188* 108* 117*  BUN 20 19 29* 32* 25*  CREATININE 1.34* 1.28* 1.60* 1.78* 1.34*  CALCIUM 8.7* 8.8* 7.9* 7.7* 8.6*  MG  --   --   --  1.9  --   PHOS  --   --   --  2.8  --    GFR: Estimated Creatinine Clearance: 26.6 mL/min (A) (by C-G formula based on SCr of 1.34 mg/dL (H)). Liver Function Tests: Recent Labs  Lab 11/10/22 1629 11/12/22 0347  AST 22 19  ALT 19 14  ALKPHOS 83 70  BILITOT 0.6 0.4  PROT 6.1* 5.3*  ALBUMIN 3.0* 2.5*   No results for input(s): "LIPASE", "AMYLASE" in the last 168 hours. No results for input(s): "AMMONIA" in the last 168 hours. Coagulation Profile: Recent Labs  Lab 11/11/22 0544 11/12/22 0347 11/13/22 0332 11/14/22 0349 11/15/22 0356  INR 2.2* 3.1* 3.6* 2.2* 1.6*   Cardiac Enzymes: No results for input(s): "CKTOTAL", "CKMB", "CKMBINDEX", "TROPONINI" in the last 168 hours. BNP (last 3 results) No results for input(s): "PROBNP" in the last 8760 hours. HbA1C: No results for input(s): "HGBA1C" in the last 72 hours. CBG: No results for input(s): "GLUCAP" in the last 168 hours. Lipid Profile: No results for input(s): "CHOL", "HDL", "LDLCALC", "TRIG", "CHOLHDL", "LDLDIRECT" in the last 72 hours. Thyroid Function Tests: No results for input(s): "TSH", "T4TOTAL", "FREET4", "T3FREE", "THYROIDAB" in the last 72 hours. Anemia Panel: No results for input(s): "VITAMINB12", "FOLATE", "FERRITIN", "TIBC", "IRON", "RETICCTPCT" in the last 72 hours. Sepsis Labs: No results for input(s): "PROCALCITON", "LATICACIDVEN" in the last 168 hours.  Recent Results (from the  past 240 hour(s))  Surgical pcr screen     Status: Abnormal   Collection Time: 11/11/22  6:06 AM   Specimen: Nasal Mucosa; Nasal Swab  Result Value Ref Range Status   MRSA, PCR NEGATIVE NEGATIVE Final   Staphylococcus aureus POSITIVE (A) NEGATIVE Final    Comment: (NOTE) The Xpert SA Assay (FDA approved for NASAL specimens in patients 45 years of age and older), is one component of a comprehensive surveillance program. It is not intended to diagnose infection nor to guide or monitor treatment. Performed at [facility], [address] [place], [place] [address]          Radiology Studies: No results found.      Scheduled Meds:  sodium chloride   Intravenous Once   Chlorhexidine Gluconate Cloth  6 each Topical Q0600   diltiazem  60 mg Oral BID   guanFACINE  2 mg Oral QPM   metoprolol succinate  25 mg Oral BID   mupirocin ointment  1 Application  Nasal BID   pantoprazole  40 mg Oral Daily   rosuvastatin  10 mg Oral Once per day on Sunday Wednesday   sertraline  75 mg Oral QHS   warfarin  2 mg Oral ONCE-1600   Warfarin - Pharmacist Dosing Inpatient   Does not apply q1600   Continuous Infusions:   LOS: 5 days    Time spent: 35 minutes    Dorcas Carrow, MD [facility]

## 2022-11-15 NOTE — Plan of Care (Signed)

## 2022-11-15 NOTE — Progress Notes (Signed)
PHARMACY - ANTICOAGULATION CONSULT NOTE  Pharmacy Consult for warfarin Indication:  hx atrial fibrillation  Allergies  Allergen Reactions   Amoxil [Amoxicillin] Diarrhea   Celebrex [Celecoxib] Other (See Comments)    Bruising and stomach pain    Patient Measurements: Height: 4\' 11"  (149.9 cm) Weight: 65.3 kg (143 lb 15.4 oz) IBW/kg (Calculated) : 43.2  Vital Signs: Temp: 98.7 F (37.1 C) (10/27 0609) Temp Source: Oral (10/27 0609) BP: 126/67 (10/27 0609) Pulse Rate: 67 (10/27 0609)  Labs: Recent Labs    11/13/22 0332 11/13/22 1441 11/14/22 0349 11/15/22 0356  HGB 6.8* 8.1* 9.3* 9.1*  HCT 21.8* 25.5* 29.6* 29.3*  PLT 145*  --  177 177  LABPROT 36.1*  --  24.4* 19.2*  INR 3.6*  --  2.2* 1.6*  CREATININE 1.78*  --  1.34*  --     Estimated Creatinine Clearance: 26.6 mL/min (A) (by C-G formula based on SCr of 1.34 mg/dL (H)).   Medical History: Past Medical History:  Diagnosis Date   Allergy    Atrial fibrillation (HCC)    Dysrhythmia    Hyperlipidemia    Hypertension    Neuromuscular disorder (HCC)    Venous insufficiency     Medications:  - PTA warfarin regimen per [facility]  clinic note on 10/16: 2mg  daily except 1 mg on MWF. INR 1.3 at that appointment -last dose taken PTA on 11/09/22  Assessment: Patient is an 82 y.o F with hx afib on warfarin PTA who presented to the ED on 11/10/22 for evaluation of generalized weakness and s/p fall. Hip xray on 11/10/22 showed left intratrochanteric femoral fracture. She went to the OR on 11/11/22 for left IM nail intertrochanteric. Pharmacy was consulted on 11/12/22 to resume warfarin.  INR on admission: 1.9  Significant events: - 10/23: left IM nail intertrochanteric in OR; 1 unit PRBC - 10/25:  hgb down 6.8; 1 unit PRBC ordered  - Warfarin held from 10/22 - 10/25  Today, 11/15/2022: INR = 1.6 is now subtherapeutic after holding warfarin CBC: Hgb low but stable s/p transfusion, Plt WNL Diet: Dysphagia III, 100% meal  intake charted.  No major DDI   Goal of Therapy:  INR 2-3 Monitor platelets by anticoagulation protocol: Yes   Plan:  Warfarin 2 mg PO home dose today CBC, INR with AM labs tomorrow  At this time, would recommend to resume home dose at discharge with anticoagulation clinic/INR follow up towards the end of the week.  Cindi Carbon, PharmD 11/15/2022,10:37 AM

## 2022-11-16 DIAGNOSIS — S72142A Displaced intertrochanteric fracture of left femur, initial encounter for closed fracture: Secondary | ICD-10-CM | POA: Diagnosis not present

## 2022-11-16 LAB — TYPE AND SCREEN
ABO/RH(D): A POS
Antibody Screen: NEGATIVE
Unit division: 0
Unit division: 0

## 2022-11-16 LAB — PROTIME-INR
INR: 1.8 — ABNORMAL HIGH (ref 0.8–1.2)
Prothrombin Time: 20.7 s — ABNORMAL HIGH (ref 11.4–15.2)

## 2022-11-16 LAB — BPAM RBC
Blood Product Expiration Date: 202411112359
Blood Product Expiration Date: 202411142359
ISSUE DATE / TIME: 202410250941
ISSUE DATE / TIME: 202410231405
Unit Type and Rh: 6200
Unit Type and Rh: 6200

## 2022-11-16 MED ORDER — CHLORHEXIDINE GLUCONATE CLOTH 2 % EX PADS
6.0000 | MEDICATED_PAD | Freq: Every day | CUTANEOUS | Status: DC
  Administered 2022-11-16: 6 via TOPICAL

## 2022-11-16 MED ORDER — POLYETHYLENE GLYCOL 3350 17 G PO PACK: 17.0000 g | PACK | Freq: Every day | ORAL | Status: AC

## 2022-11-16 MED ORDER — WARFARIN SODIUM 2 MG PO TABS
2.0000 mg | ORAL_TABLET | Freq: Once | ORAL | Status: DC
  Filled 2022-11-16: qty 1

## 2022-11-16 NOTE — Progress Notes (Signed)
     5 Days Post-Op Procedure(s) (LRB): INTRAMEDULLARY (IM) NAIL INTERTROCHANTERIC (Left) Subjective:  POD5 left hip IMN.  Coumadin resumed per pharmacy.  Patient sitting up comfortably in bed. Doing well with mobility and transfers with PT. Plan for DC to SNF.  Objective:   VITALS:   Vitals:   11/15/22 0609 11/15/22 1332 11/15/22 2125 11/16/22 0609  BP: 126/67 133/81 131/65 132/83  Pulse: 67 74 72   Resp: 16 14 16 17   Temp: 98.7 F (37.1 C) 97.8 F (36.6 C) 97.7 F (36.5 C) 98.2 F (36.8 C)  TempSrc: Oral Oral Oral Oral  SpO2: 98% 95% 99% 99%  Weight:      Height:        Alert and oriented 1-2 at baseline Intact pulses distally Dorsiflexion/Plantar flexion intact Incision: dressing C/D/I Compartment soft, mild expected post-operative swelling of surgical area Wiggles toes spontaneously and appropriately   Lab Results  Component Value Date   WBC 8.7 11/15/2022   HGB 9.1 (L) 11/15/2022   HCT 29.3 (L) 11/15/2022   MCV 91.0 11/15/2022   PLT 177 11/15/2022   BMET    Component Value Date/Time   NA 139 11/14/2022 0349   K 3.7 11/14/2022 0349   CL 106 11/14/2022 0349   CO2 23 11/14/2022 0349   GLUCOSE 117 (H) 11/14/2022 0349   BUN 25 (H) 11/14/2022 0349   BUN 12 12/11/2011 0000   CREATININE 1.34 (H) 11/14/2022 0349   CALCIUM 8.6 (L) 11/14/2022 0349   GFRNONAA 40 (L) 11/14/2022 0349     Xray: stable post-operative imaging  Assessment/Plan: 5 Days Post-Op   Principal Problem:   Closed intertrochanteric fracture of left femur, initial encounter (HCC) Active Problems:   Falls frequently   Asymptomatic bacteriuria   Post op recs: WB: WBAT LLE Abx: ancef x23 hours post op Imaging: PACU xrays Dressing: keep intact until follow up, change PRN if soiled or saturated. DVT prophylaxis: resume coumadin post op. Follow up: 2 weeks after surgery for a wound check with Dr. Blanchie Dessert at [facility].  Address: [address],  [place], [place] [address]  Office Phone: [phone]   Joen Laura 11/16/2022, 9:57 AM   Contact information:   Weekdays 7am-5pm epic message Dr. Blanchie Dessert, or call office for patient follow up: [phone] After hours and holidays please check [email or web address] for group call information for [facility]

## 2022-11-16 NOTE — Progress Notes (Signed)
Attempted to give report. No answer. Will try again in a later time.

## 2022-11-16 NOTE — TOC Transition Note (Signed)
Transition of Care [facility]) - CM/SW Discharge Note   Patient Details  Name: Brandi [name] MRN: [id number] Date of Birth: July 16, 1940  Transition [facility] Care [facility]) CM/SW Contact:  Amada Jupiter, LCSW Phone Number: 11/16/2022, 11:51 AM   Clinical Narrative:     Pt medically cleared for dc to SNF today.  Bed accepted at [facility] and have insurance authorization.  Pt and spouse aware and agreeable with dc today.  PTAR set up for 1:00pm pick up.  RN to call report to [phone].  No further TOC needs.  Final next level of care: Skilled Nursing Facility Barriers to Discharge: Barriers Resolved   Patient Goals and CMS Choice   Choice offered to / list presented to : Spouse  Discharge Placement                Patient [facility] bed at: [facility] Patient to be transferred to facility by: PTAR Name of family member notified: spouse Patient and family notified of of transfer: 11/16/22  Discharge Plan and Services Additional resources added to the After Visit Summary for   In-house Referral: Clinical Social Work   Post Acute Care Choice: Skilled Nursing Facility                               Social Determinants of Health (SDOH) Interventions SDOH Screenings   Food Insecurity: Patient Unable To Answer (11/11/2022)  Housing: Patient Unable To Answer (11/11/2022)  Transportation Needs: Patient Unable To Answer (11/11/2022)  Utilities: Patient Unable To Answer (11/11/2022)  Tobacco Use: Low Risk  (11/11/2022)     Readmission Risk Interventions    11/12/2022    3:06 PM  Readmission Risk Prevention Plan  Transportation Screening Complete  PCP or Specialist Appt within 3-5 Days Complete  HRI or Home Care Consult Complete  Palliative Care Screening Complete  Medication Review (RN Care Manager) Complete

## 2022-11-16 NOTE — Discharge Summary (Signed)
Physician Discharge Summary  Joelisa Tobey XBM:[id number] DOB: 16-Jul-1940 DOA: 11/10/2022  PCP: Eloisa Northern, MD  Admit date: 11/10/2022 Discharge date: 11/16/2022  Admitted From: Home Disposition: Skilled nursing facility  Recommendations for Outpatient Follow-up:  Follow up with PCP in 1-2 weeks Please obtain BMP/CBC in one week Discharging with Foley catheter.  Give voiding trial once patient has improved moderately in 1 to 2 weeks.  Discharge Condition: Fair CODE STATUS: DNR with limited intervention Diet recommendation: Regular diet, nutritional supplements  Discharge summary: 82 year old with history of A-fib and flutter on Coumadin, frequent falls, dementia, recent right pubic ramus fracture that was managed nonoperatively went home 2 weeks ago from SNF, she has poor mobility status.  Fell 2 nights ago and came to emergency room on 10/22.  She was found to have left intertrochanteric fracture.  She was brought from [facility] to [place] for OR availability.  INR was reversed and patient underwent ORIF as below. Postop course complicated with urinary retention and she has a Foley catheter now. Stable to transfer to a SNF.    Assessment & plan of care:   Closed traumatic left intertrochanteric fracture of the hip, recent right pubic rami fractures. Status post IM nail intertrochanteric fracture left, Dr. Blanchie Dessert 10/23 Weightbearing as tolerated Adequate pain medications, combination medication regimen non-opiates and opiates.  Bowel regimens to avoid constipation.  Scheduled MiraLAX. DVT prophylaxis, therapeutic INR.  Resume home dose of Coumadin. Orthopedics to schedule follow-up in 2 weeks.   AKI on CKD stage IIIb: This is likely due to hemodynamic instability during periprocedure.  Will avoid hypotension.  Discontinue antihypertensives except rate control medications. Creatinine stabilized back to her usual levels.   Chronic medical issues including Acute on  chronic anemia of blood loss, hemoglobin 8.6-7.8-6.7-1 unit of PRBC.  Appropriately responded.  More than 9 today.  Recheck in 1 week.   Physical deconditioning, advanced frailty and debility.  Work with PT OT.     Paroxysmal A-fib, hypertension: Coumadin resumed, continue Cardizem and metoprolol.    She can go back on home dose of Coumadin.   Hyperlipidemia, on Crestor.   Depression, on sertraline.  Urine retention: Recurrent urinary retention secondary to hip pain and immobility.  Will discharge with Foley catheter.  Once her mobility improves, can remove Foley catheter and give voiding trial in 1 to 2 weeks.   Goal of care:  High 24-month mortality with pelvic fracture now with hip fracture and patient with gradual worsening mental status and profound debility.  Palliative following.  She will benefit with outpatient palliative care follow-up after SNF.   Disposition: Patient has bed at [facility].  Stable for discharge.     Discharge Diagnoses:  Principal Problem:   Closed intertrochanteric fracture of left femur, initial encounter (HCC) Active Problems:   Falls frequently   Asymptomatic bacteriuria    Discharge Instructions  Discharge Instructions     Diet general   Complete by: As directed    Discharge instructions   Complete by: As directed    Voiding trial in 1 week after improvement of mobility.   Increase activity slowly   Complete by: As directed    Increase activity slowly   Complete by: As directed       Allergies as of 11/16/2022       Reactions   Amoxil [amoxicillin] Diarrhea   Celebrex [celecoxib] Other (See Comments)   Bruising and stomach pain        Medication List  STOP taking these medications    bisacodyl 5 MG EC tablet Commonly known as: DULCOLAX   HYDROcodone-acetaminophen 5-325 MG tablet Commonly known as: NORCO/VICODIN   lisinopril 5 MG tablet Commonly known as: ZESTRIL   metoprolol tartrate 25 MG tablet Commonly  known as: LOPRESSOR       TAKE these medications    acetaminophen 500 MG tablet Commonly known as: TYLENOL Take 1,000 mg by mouth every 6 (six) hours as needed for mild pain.   Aspercreme Lidocaine 4 % Liqd Generic drug: Lidocaine HCl Apply 1 Application topically 2 (two) times daily as needed (knee pain).   azelastine 0.1 % nasal spray Commonly known as: ASTELIN Place 2 sprays into both nostrils daily as needed for rhinitis or allergies.   Centrum Silver 50+Women Tabs Take 1 tablet by mouth every evening.   cetirizine 10 MG tablet Commonly known as: ZYRTEC Take 10 mg by mouth daily as needed for allergies.   diltiazem 60 MG 12 hr capsule Commonly known as: CARDIZEM SR Take 60 mg by mouth every 12 (twelve) hours.   ferrous sulfate 325 (65 FE) MG tablet Take 325 mg by mouth daily with breakfast.   guanFACINE 2 MG tablet Commonly known as: TENEX Take 2 mg by mouth every evening.   metoprolol succinate 50 MG 24 hr tablet Commonly known as: TOPROL-XL Take 25 mg by mouth in the morning and at bedtime. Take with or immediately following a meal.   oxyCODONE 5 MG immediate release tablet Commonly known as: Roxicodone Take 0.5-1 tablets (2.5-5 mg total) by mouth every 6 (six) hours as needed for up to 7 days for severe pain (pain score 7-10) or moderate pain (pain score 4-6).   pantoprazole 40 MG tablet Commonly known as: PROTONIX Take 1 tablet (40 mg total) by mouth daily. What changed: when to take this   polyethylene glycol 17 g packet Commonly known as: MIRALAX / GLYCOLAX Take 17 g by mouth daily. Start taking on: November 17, 2022   rosuvastatin 10 MG tablet Commonly known as: CRESTOR Take 10 mg by mouth See admin instructions. Take 10 mg by mouth at bedtime on Sun/Wed only (twice a week)   senna-docusate 8.6-50 MG tablet Commonly known as: Senokot-S Take 1 tablet by mouth at bedtime as needed for mild constipation.   sertraline 50 MG tablet Commonly known  as: ZOLOFT Take 75 mg by mouth at bedtime.   Systane Complete PF 0.6 % Soln Generic drug: Propylene Glycol (PF) Place 1 drop into both eyes in the morning, at noon, and at bedtime.   warfarin 2 MG tablet Commonly known as: COUMADIN Take as directed. If you are unsure how to take this medication, talk to your nurse or doctor. Original instructions: Take 1/2 tablet (1mg ) to 1 tablet (2mg ) by mouth daily or as directed by Anticoagulation Clinic. What changed:  how much to take how to take this when to take this additional instructions        Contact information for follow-up providers     Joen Laura, MD Follow up in 2 week(s).   Specialty: Orthopedic Surgery Contact information: [address] [place] [address] [phone]              Contact information for after-discharge care     Destination     [facility] Preferred SNF .   Service: Skilled Paramedic information: [address] [place] [address] [phone]  Allergies  Allergen Reactions   Amoxil [Amoxicillin] Diarrhea   Celebrex [Celecoxib] Other (See Comments)    Bruising and stomach pain    Consultations: Orthopedics Palliative care   Procedures/Studies: DG HIP UNILAT W OR W/O PELVIS 2-3 VIEWS LEFT  Result Date: 11/11/2022 CLINICAL DATA:  Postop fracture fixation. EXAM: DG HIP (WITH OR WITHOUT PELVIS) 2-3V LEFT COMPARISON:  Preoperative imaging FINDINGS: Femoral intramedullary nail with trans trochanteric and distal locking screw fixation traverse proximal femur fracture. The subtrochanteric component laterally is new from prior exam. There are remote fractures of the right pubic rami. IMPRESSION: ORIF of proximal left femur fracture. Electronically Signed   By: Narda Rutherford M.D.   On: 11/11/2022 17:49   DG FEMUR MIN 2 VIEWS LEFT  Result Date: 11/11/2022 CLINICAL DATA:  Elective  surgery. EXAM: LEFT FEMUR 2 VIEWS COMPARISON:  Preoperative imaging FINDINGS: Fourteen fluoroscopic spot views of the left femur obtained in the operating room. Sequential images during femoral intramedullary nail with trans trochanteric and distal locking screw fixation traversing proximal femur fracture. Fluoroscopy time 2 minutes 2 seconds. Dose 20.629 mGy. IMPRESSION: Intraoperative fluoroscopy during proximal femur fracture fixation. Electronically Signed   By: Narda Rutherford M.D.   On: 11/11/2022 17:45   DG C-Arm 1-60 Min-No Report  Result Date: 11/11/2022 Fluoroscopy was utilized by the requesting physician.  No radiographic interpretation.   DG C-Arm 1-60 Min-No Report  Result Date: 11/11/2022 Fluoroscopy was utilized by the requesting physician.  No radiographic interpretation.   DG FEMUR MIN 2 VIEWS LEFT  Result Date: 11/11/2022 CLINICAL DATA:  Intertrochanteric femur fracture. EXAM: LEFT FEMUR 2 VIEWS COMPARISON:  Hip radiograph earlier today. FINDINGS: Unchanged alignment of intertrochanteric femur fracture with displacement of the lesser and greater trochanters. The distal femur is intact. Prior knee arthroplasty. Vascular calcifications are seen. IMPRESSION: Unchanged alignment of intertrochanteric femur fracture. Electronically Signed   By: Narda Rutherford M.D.   On: 11/11/2022 00:02   DG Hip Unilat With Pelvis 2-3 Views Left  Result Date: 11/10/2022 CLINICAL DATA:  Recent fall with hip pain, initial encounter EXAM: DG HIP (WITH OR WITHOUT PELVIS) 3V LEFT COMPARISON:  08/25/2022 FINDINGS: Intratrochanteric left femoral fracture is noted with impaction and angulation at the fracture site. The femoral head is well seated. Pelvic ring is intact. Old healed superior and inferior pubic rami fractures on the right are noted. IMPRESSION: Left intratrochanteric femoral fracture. 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 Electronically Signed   By: Alcide Clever M.D.   On:  11/10/2022 19:49   CT Head Wo Contrast  Result Date: 11/10/2022 CLINICAL DATA:  Ataxia, head trauma. Polytrauma, blunt. Additional history obtained: Lethargy. Fatigue. EXAM: CT HEAD WITHOUT CONTRAST CT CERVICAL SPINE WITHOUT CONTRAST TECHNIQUE: Multidetector CT imaging of the head and cervical spine was performed following the standard protocol without intravenous contrast. Multiplanar CT image reconstructions of the cervical spine were also generated. RADIATION DOSE REDUCTION: This exam was performed according to the departmental dose-optimization program which includes automated exposure control, adjustment of the mA and/or kV according to patient size and/or use of iterative reconstruction technique. COMPARISON:  Head CT 08/25/2022.  Cervical spine CT 08/25/2022. FINDINGS: CT HEAD FINDINGS Brain: Prominence of the ventricles and sulci which appears commensurate with the degree of cerebral volume loss. Patchy and ill-defined hypoattenuation within the cerebral white matter, nonspecific but compatible with moderate chronic small vessel ischemic disease. There is no acute intracranial hemorrhage. No demarcated cortical infarct. No extra-axial fluid collection. No evidence of an intracranial mass.  No midline shift. Vascular: No hyperdense vessel.  Atherosclerotic calcifications Skull: No calvarial fracture or aggressive osseous lesion. Sinuses/Orbits: No mass or acute finding within the imaged orbits. Complete opacification of the right maxillary sinus at the imaged levels (with superimposed foci mineralization within the sinus). Partial opacification of anterior right ethmoid air cells, overall mild-to-moderate in severity. CT CERVICAL SPINE FINDINGS Alignment: Nonspecific reversal of the expected cervical lordosis. 3 mm C3-C4 grade 1 anterolisthesis. 2 mm C4-C5 grade 1 anterolisthesis. Slight C5-C6 grade 1 retrolisthesis. 2-3 mm C7-T1 grade 1 anterolisthesis. Skull base and vertebrae: The basion-dental and  atlanto-dental intervals are maintained.No evidence of acute fracture to the cervical spine. Facet ankylosis on the right at C2-C3. Multilevel ventral osteophytes. Soft tissues and spinal canal: No prevertebral fluid or swelling. No visible canal hematoma. Disc levels: Cervical spondylosis with multilevel disc space narrowing, disc bulges/central disc protrusions, posterior disc osteophyte complexes, endplate spurring, uncovertebral hypertrophy and facet arthrosis. Disc space narrowing is greatest at C5-C6 and C6-C7 (advanced at these levels). Multilevel spinal canal narrowing. Most notably, a posterior disc osteophyte complex contributes to spinal canal stenosis at C5-C6 which is suspected moderate in severity. Multilevel bony neural foraminal narrowing. Degenerative changes also present at the C1-C2 articulation. Upper chest: No consolidation within the imaged lung apices. No visible pneumothorax. Multiple thyroid nodules measuring up to 12 mm, not meeting consensus criteria for ultrasound follow-up based on size. No follow-up imaging recommended. Reference: J Am Coll Radiol. 2015 Feb;12(2): 143-50. IMPRESSION: CT head: 1.  No evidence of an acute intracranial abnormality. 2. Parenchymal atrophy and chronic small vessel ischemic disease. 3. Paranasal sinus disease at the imaged levels, as described. CT cervical spine: 1. No evidence of an acute cervical spine fracture. 2. Nonspecific reversal of the expected cervical lordosis. 3. Grade 1 spondylolisthesis at C3-C4, C4-C5, C5-C6 and C7-T1. 4. Cervical spondylosis as described. 5. Facet ankylosis on the right at C2-C3. Electronically Signed   By: Jackey Loge D.O.   On: 11/10/2022 19:48   CT Cervical Spine Wo Contrast  Result Date: 11/10/2022 CLINICAL DATA:  Ataxia, head trauma. Polytrauma, blunt. Additional history obtained: Lethargy. Fatigue. EXAM: CT HEAD WITHOUT CONTRAST CT CERVICAL SPINE WITHOUT CONTRAST TECHNIQUE: Multidetector CT imaging of the head and  cervical spine was performed following the standard protocol without intravenous contrast. Multiplanar CT image reconstructions of the cervical spine were also generated. RADIATION DOSE REDUCTION: This exam was performed according to the departmental dose-optimization program which includes automated exposure control, adjustment of the mA and/or kV according to patient size and/or use of iterative reconstruction technique. COMPARISON:  Head CT 08/25/2022.  Cervical spine CT 08/25/2022. FINDINGS: CT HEAD FINDINGS Brain: Prominence of the ventricles and sulci which appears commensurate with the degree of cerebral volume loss. Patchy and ill-defined hypoattenuation within the cerebral white matter, nonspecific but compatible with moderate chronic small vessel ischemic disease. There is no acute intracranial hemorrhage. No demarcated cortical infarct. No extra-axial fluid collection. No evidence of an intracranial mass. No midline shift. Vascular: No hyperdense vessel.  Atherosclerotic calcifications Skull: No calvarial fracture or aggressive osseous lesion. Sinuses/Orbits: No mass or acute finding within the imaged orbits. Complete opacification of the right maxillary sinus at the imaged levels (with superimposed foci mineralization within the sinus). Partial opacification of anterior right ethmoid air cells, overall mild-to-moderate in severity. CT CERVICAL SPINE FINDINGS Alignment: Nonspecific reversal of the expected cervical lordosis. 3 mm C3-C4 grade 1 anterolisthesis. 2 mm C4-C5 grade 1 anterolisthesis. Slight C5-C6 grade 1 retrolisthesis. 2-3 mm C7-T1  grade 1 anterolisthesis. Skull base and vertebrae: The basion-dental and atlanto-dental intervals are maintained.No evidence of acute fracture to the cervical spine. Facet ankylosis on the right at C2-C3. Multilevel ventral osteophytes. Soft tissues and spinal canal: No prevertebral fluid or swelling. No visible canal hematoma. Disc levels: Cervical spondylosis  with multilevel disc space narrowing, disc bulges/central disc protrusions, posterior disc osteophyte complexes, endplate spurring, uncovertebral hypertrophy and facet arthrosis. Disc space narrowing is greatest at C5-C6 and C6-C7 (advanced at these levels). Multilevel spinal canal narrowing. Most notably, a posterior disc osteophyte complex contributes to spinal canal stenosis at C5-C6 which is suspected moderate in severity. Multilevel bony neural foraminal narrowing. Degenerative changes also present at the C1-C2 articulation. Upper chest: No consolidation within the imaged lung apices. No visible pneumothorax. Multiple thyroid nodules measuring up to 12 mm, not meeting consensus criteria for ultrasound follow-up based on size. No follow-up imaging recommended. Reference: J Am Coll Radiol. 2015 Feb;12(2): 143-50. IMPRESSION: CT head: 1.  No evidence of an acute intracranial abnormality. 2. Parenchymal atrophy and chronic small vessel ischemic disease. 3. Paranasal sinus disease at the imaged levels, as described. CT cervical spine: 1. No evidence of an acute cervical spine fracture. 2. Nonspecific reversal of the expected cervical lordosis. 3. Grade 1 spondylolisthesis at C3-C4, C4-C5, C5-C6 and C7-T1. 4. Cervical spondylosis as described. 5. Facet ankylosis on the right at C2-C3. Electronically Signed   By: Jackey Loge D.O.   On: 11/10/2022 19:48   DG Chest 1 View  Result Date: 11/10/2022 CLINICAL DATA:  Weakness EXAM: PORTABLE CHEST 1 VIEW COMPARISON:  08/25/2022 FINDINGS: Cardiac shadow is stable. Tortuous thoracic aorta is noted. The lungs are well aerated bilaterally. Stable scarring is noted in the midportion of both lungs. No new focal infiltrate is seen. IMPRESSION: No acute abnormality noted. Electronically Signed   By: Alcide Clever M.D.   On: 11/10/2022 19:48   (Echo, Carotid, EGD, Colonoscopy, ERCP)    Subjective: Patient seen and examined.  Today she is more alert awake and interactive.   Patient tells me that they had to put her catheter back in.  She had a good bowel movement overnight.  Pain is not present at rest but it hurts to mobilize.   Discharge Exam: Vitals:   11/15/22 2125 11/16/22 0609  BP: 131/65 132/83  Pulse: 72   Resp: 16 17  Temp: 97.7 F (36.5 C) 98.2 F (36.8 C)  SpO2: 99% 99%   Vitals:   11/15/22 0609 11/15/22 1332 11/15/22 2125 11/16/22 0609  BP: 126/67 133/81 131/65 132/83  Pulse: 67 74 72   Resp: 16 14 16 17   Temp: 98.7 F (37.1 C) 97.8 F (36.6 C) 97.7 F (36.5 C) 98.2 F (36.8 C)  TempSrc: Oral Oral Oral Oral  SpO2: 98% 95% 99% 99%  Weight:      Height:        General: Pt is alert, awake, not in acute distress Interactive.  Pleasant to conversation. Cardiovascular: RRR, S1/S2 +, no rubs, no gallops Respiratory: CTA bilaterally, no wheezing, no rhonchi Abdominal: Soft, NT, ND, bowel sounds + Extremities: Mild edema left hip incisions.  Otherwise clean and dry.  No cyanosis Foley catheter with clear urine.    The results of significant diagnostics from this hospitalization (including imaging, microbiology, ancillary and laboratory) are listed below for reference.     Microbiology: Recent Results (from the past 240 hour(s))  Surgical pcr screen     Status: Abnormal   Collection Time: 11/11/22  6:06 AM   Specimen: Nasal Mucosa; Nasal Swab  Result Value Ref Range Status   MRSA, PCR NEGATIVE NEGATIVE Final   Staphylococcus aureus POSITIVE (A) NEGATIVE Final    Comment: (NOTE) The Xpert SA Assay (FDA approved for NASAL specimens in patients 101 years of age and older), is one component of a comprehensive surveillance program. It is not intended to diagnose infection nor to guide or monitor treatment. Performed at [facility], [address], [place], [place] [address]      Labs: BNP (last 3 results) No results for input(s): "BNP" in the last 8760 hours. Basic Metabolic Panel: Recent Labs  Lab 11/10/22 1629  11/11/22 0544 11/12/22 0347 11/13/22 0332 11/14/22 0349  NA 140 139 135 131* 139  K 3.9 4.1 4.1 3.9 3.7  CL 108 105 103 101 106  CO2 23 23 22 23 23   GLUCOSE 131* 130* 188* 108* 117*  BUN 20 19 29* 32* 25*  CREATININE 1.34* 1.28* 1.60* 1.78* 1.34*  CALCIUM 8.7* 8.8* 7.9* 7.7* 8.6*  MG  --   --   --  1.9  --   PHOS  --   --   --  2.8  --    Liver Function Tests: Recent Labs  Lab 11/10/22 1629 11/12/22 0347  AST 22 19  ALT 19 14  ALKPHOS 83 70  BILITOT 0.6 0.4  PROT 6.1* 5.3*  ALBUMIN 3.0* 2.5*   No results for input(s): "LIPASE", "AMYLASE" in the last 168 hours. No results for input(s): "AMMONIA" in the last 168 hours. CBC: Recent Labs  Lab 11/10/22 1629 11/11/22 0544 11/12/22 0347 11/13/22 0332 11/13/22 1441 11/14/22 0349 11/15/22 0356  WBC 13.8* 12.5* 11.7* 12.1*  --  9.9 8.7  NEUTROABS 12.0*  --   --  9.1*  --  7.1  --   HGB 8.6* 7.8* 7.8* 6.8* 8.1* 9.3* 9.1*  HCT 28.3* 25.2* 25.4* 21.8* 25.5* 29.6* 29.3*  MCV 92.8 91.0 90.4 89.7  --  90.0 91.0  PLT 201 191 145* 145*  --  177 177   Cardiac Enzymes: No results for input(s): "CKTOTAL", "CKMB", "CKMBINDEX", "TROPONINI" in the last 168 hours. BNP: Invalid input(s): "POCBNP" CBG: No results for input(s): "GLUCAP" in the last 168 hours. D-Dimer No results for input(s): "DDIMER" in the last 72 hours. Hgb A1c No results for input(s): "HGBA1C" in the last 72 hours. Lipid Profile No results for input(s): "CHOL", "HDL", "LDLCALC", "TRIG", "CHOLHDL", "LDLDIRECT" in the last 72 hours. Thyroid function studies No results for input(s): "TSH", "T4TOTAL", "T3FREE", "THYROIDAB" in the last 72 hours.  Invalid input(s): "FREET3" Anemia work up No results for input(s): "VITAMINB12", "FOLATE", "FERRITIN", "TIBC", "IRON", "RETICCTPCT" in the last 72 hours. Urinalysis    Component Value Date/Time   COLORURINE YELLOW 11/10/2022 1822   APPEARANCEUR HAZY (A) 11/10/2022 1822   LABSPEC 1.011 11/10/2022 1822   PHURINE 5.0  11/10/2022 1822   GLUCOSEU NEGATIVE 11/10/2022 1822   HGBUR NEGATIVE 11/10/2022 1822   BILIRUBINUR NEGATIVE 11/10/2022 1822   KETONESUR NEGATIVE 11/10/2022 1822   PROTEINUR 100 (A) 11/10/2022 1822   NITRITE POSITIVE (A) 11/10/2022 1822   LEUKOCYTESUR SMALL (A) 11/10/2022 1822   Sepsis Labs Recent Labs  Lab 11/12/22 0347 11/13/22 0332 11/14/22 0349 11/15/22 0356  WBC 11.7* 12.1* 9.9 8.7   Microbiology Recent Results (from the past 240 hour(s))  Surgical pcr screen     Status: Abnormal   Collection Time: 11/11/22  6:06 AM   Specimen: Nasal Mucosa;  Nasal Swab  Result Value Ref Range Status   MRSA, PCR NEGATIVE NEGATIVE Final   Staphylococcus aureus POSITIVE (A) NEGATIVE Final    Comment: (NOTE) The Xpert SA Assay (FDA approved for NASAL specimens in patients 58 years of age and older), is one component of a comprehensive surveillance program. It is not intended to diagnose infection nor to guide or monitor treatment. Performed at [facility], [address] [place], [place] [address]      Time coordinating discharge:  32 minutes  SIGNED:   Dorcas Carrow, MD  [facility] 11/16/2022, 11:29 AM

## 2022-11-16 NOTE — Progress Notes (Signed)
PHARMACY - ANTICOAGULATION CONSULT NOTE  Pharmacy Consult for warfarin Indication:  hx atrial fibrillation  Allergies  Allergen Reactions   Amoxil [Amoxicillin] Diarrhea   Celebrex [Celecoxib] Other (See Comments)    Bruising and stomach pain    Patient Measurements: Height: 4\' 11"  (149.9 cm) Weight: 65.3 kg (143 lb 15.4 oz) IBW/kg (Calculated) : 43.2 Heparin Dosing Weight:   Vital Signs: Temp: 98.2 F (36.8 C) (10/28 0609) Temp Source: Oral (10/28 0609) BP: 132/83 (10/28 0609)  Labs: Recent Labs    11/13/22 1441 11/14/22 0349 11/15/22 0356 11/16/22 0413  HGB 8.1* 9.3* 9.1*  --   HCT 25.5* 29.6* 29.3*  --   PLT  --  177 177  --   LABPROT  --  24.4* 19.2* 20.7*  INR  --  2.2* 1.6* 1.8*  CREATININE  --  1.34*  --   --     Estimated Creatinine Clearance: 26.6 mL/min (A) (by C-G formula based on SCr of 1.34 mg/dL (H)).   Medical History: Past Medical History:  Diagnosis Date   Allergy    Atrial fibrillation (HCC)    Dysrhythmia    Hyperlipidemia    Hypertension    Neuromuscular disorder (HCC)    Venous insufficiency     Medications:  - PTA warfarin regimen per [facility]  clinic note on 10/16: 2mg  daily except 1 mg on MWF (last dose taken on 11/09/22)  Assessment: Patient is an 82 y.o F with hx afib on warfarin PTA who presented to the ED on 11/10/22 for evaluation of generalized weakness and s/p fall. Hip xray on 11/10/22 showed left intratrochanteric femoral fracture. She went to the OR on 11/11/22 for left IM nail intertrochanteric. Pharmacy was consulted on 11/12/22 to resume warfarin back for pt.   Significant events: - 10/23: left IM nail intertrochanteric in OR; 1 unit PRBC - 10/25:  hgb down 6.8; 1 unit PRBC ordered  -Warfarin held from 10/22 - 10/25   Today, 11/16/2022: - INR is sub-therapeutic but trending up with 1.8  - no bleeding documented   Goal of Therapy:  INR 2-3 Monitor platelets by anticoagulation protocol: Yes   Plan:  - warfarin 2 mg  PO x1 today - daily INR - monitor for s/sx bleeding  Toini Failla P 11/16/2022,10:05 AM

## 2022-11-16 NOTE — Plan of Care (Signed)
  Problem: Coping: Goal: Level of anxiety will decrease Outcome: Progressing   Problem: Pain Management: Goal: General experience of comfort will improve Outcome: Progressing   Problem: Safety: Goal: Ability to remain free from injury will improve Outcome: Progressing

## 2022-12-22 ENCOUNTER — Telehealth: Payer: Self-pay | Admitting: Cardiovascular Disease

## 2022-12-22 ENCOUNTER — Ambulatory Visit (INDEPENDENT_AMBULATORY_CARE_PROVIDER_SITE_OTHER): Payer: Medicare Other | Admitting: *Deleted

## 2022-12-22 DIAGNOSIS — Z7901 Long term (current) use of anticoagulants: Secondary | ICD-10-CM | POA: Diagnosis not present

## 2022-12-22 DIAGNOSIS — I484 Atypical atrial flutter: Secondary | ICD-10-CM | POA: Diagnosis not present

## 2022-12-22 LAB — POCT INR: INR: 3.1 — AB (ref 2.0–3.0)

## 2022-12-22 NOTE — Telephone Encounter (Signed)
Spoke with [facility], please refer to Anticoagulation Encounter for details.

## 2022-12-22 NOTE — Telephone Encounter (Signed)
Vernona Rieger from [facility] is calling to inform us that the patient's INR was 3.1 today. Vernona Rieger stated the patient takes their medication as 2 mg on Tuesday, Thursday, Saturday, Sunday and 1 MG on Monday, Wednesday, and Friday. Please advise.

## 2022-12-22 NOTE — Patient Instructions (Addendum)
Description   Spoke with Vernona Rieger RN with [facility] and advised to have pt to take 1/2 tablet of warfarin today then continue taking Warfarin 1 tablet daily, except 1/2 tablet on Mondays, Wednesdays and Fridays. Recheck INR in 1 week.  [phone]

## 2023-01-05 ENCOUNTER — Ambulatory Visit (INDEPENDENT_AMBULATORY_CARE_PROVIDER_SITE_OTHER): Payer: Medicare Other | Admitting: Cardiology

## 2023-01-05 DIAGNOSIS — Z7901 Long term (current) use of anticoagulants: Secondary | ICD-10-CM

## 2023-01-05 LAB — POCT INR: INR: 4.7 — AB (ref 2.0–3.0)

## 2023-01-19 ENCOUNTER — Ambulatory Visit (INDEPENDENT_AMBULATORY_CARE_PROVIDER_SITE_OTHER): Payer: Medicare Other

## 2023-01-19 DIAGNOSIS — Z7901 Long term (current) use of anticoagulants: Secondary | ICD-10-CM

## 2023-01-19 DIAGNOSIS — I484 Atypical atrial flutter: Secondary | ICD-10-CM

## 2023-01-19 LAB — POCT INR: INR: 7 — AB (ref 2.0–3.0)

## 2023-01-19 NOTE — Patient Instructions (Signed)
 Description   Spoke with Brittany, RN [facility] while in pt's home, advised to have pt HOLD Warfarin x 3 days and recheck INR on Friday before resuming Warfarin.  Advised to have pt monitor for bleeding and go to the ED with any bleeding problems. Previous Warfarin dosage regimen 1 tablet daily, except 1/2 tablet on Mondays, Wednesdays and Fridays.  [phone]

## 2023-01-22 ENCOUNTER — Ambulatory Visit (INDEPENDENT_AMBULATORY_CARE_PROVIDER_SITE_OTHER): Payer: Medicare Other | Admitting: Cardiovascular Disease

## 2023-01-22 DIAGNOSIS — Z7901 Long term (current) use of anticoagulants: Secondary | ICD-10-CM

## 2023-01-22 LAB — POCT INR: INR: 2.9 (ref 2.0–3.0)

## 2023-01-26 ENCOUNTER — Ambulatory Visit (INDEPENDENT_AMBULATORY_CARE_PROVIDER_SITE_OTHER): Payer: Medicare Other | Admitting: Internal Medicine

## 2023-01-26 DIAGNOSIS — Z7901 Long term (current) use of anticoagulants: Secondary | ICD-10-CM | POA: Diagnosis not present

## 2023-01-26 LAB — POCT INR: INR: 4 — AB (ref 2.0–3.0)

## 2023-02-03 ENCOUNTER — Ambulatory Visit (INDEPENDENT_AMBULATORY_CARE_PROVIDER_SITE_OTHER): Payer: Medicare Other | Admitting: Internal Medicine

## 2023-02-03 DIAGNOSIS — Z7901 Long term (current) use of anticoagulants: Secondary | ICD-10-CM | POA: Diagnosis not present

## 2023-02-03 LAB — POCT INR: INR: 4 — AB (ref 2.0–3.0)

## 2023-02-10 ENCOUNTER — Ambulatory Visit (INDEPENDENT_AMBULATORY_CARE_PROVIDER_SITE_OTHER): Payer: Medicare Other | Admitting: Internal Medicine

## 2023-02-10 DIAGNOSIS — Z7901 Long term (current) use of anticoagulants: Secondary | ICD-10-CM | POA: Diagnosis not present

## 2023-02-10 LAB — POCT INR: INR: 2.7 (ref 2.0–3.0)

## 2023-02-17 ENCOUNTER — Ambulatory Visit (INDEPENDENT_AMBULATORY_CARE_PROVIDER_SITE_OTHER): Payer: Medicare Other | Admitting: Internal Medicine

## 2023-02-17 DIAGNOSIS — Z7901 Long term (current) use of anticoagulants: Secondary | ICD-10-CM | POA: Diagnosis not present

## 2023-02-17 LAB — POCT INR: INR: 2.3 (ref 2.0–3.0)

## 2023-03-03 ENCOUNTER — Ambulatory Visit (INDEPENDENT_AMBULATORY_CARE_PROVIDER_SITE_OTHER): Payer: Medicare Other | Admitting: Internal Medicine

## 2023-03-03 DIAGNOSIS — Z7901 Long term (current) use of anticoagulants: Secondary | ICD-10-CM | POA: Diagnosis not present

## 2023-03-03 LAB — POCT INR: INR: 1.6 — AB (ref 2.0–3.0)

## 2023-03-17 ENCOUNTER — Ambulatory Visit (INDEPENDENT_AMBULATORY_CARE_PROVIDER_SITE_OTHER): Payer: Medicare Other | Admitting: Internal Medicine

## 2023-03-17 DIAGNOSIS — Z7901 Long term (current) use of anticoagulants: Secondary | ICD-10-CM | POA: Diagnosis not present

## 2023-03-17 LAB — POCT INR: INR: 2.5 (ref 2.0–3.0)

## 2023-03-26 ENCOUNTER — Other Ambulatory Visit: Payer: Self-pay | Admitting: Cardiovascular Disease

## 2023-03-26 DIAGNOSIS — I484 Atypical atrial flutter: Secondary | ICD-10-CM

## 2023-03-26 NOTE — Telephone Encounter (Signed)
 Prescription refill request received for warfarin Lov: 04/13/22 Mariah Milling)  Next INR check: 03/31/23 Warfarin tablet strength: 2mg   Appropriate dose. Refill sent.

## 2023-03-31 ENCOUNTER — Ambulatory Visit (INDEPENDENT_AMBULATORY_CARE_PROVIDER_SITE_OTHER): Admitting: Internal Medicine

## 2023-03-31 DIAGNOSIS — Z7901 Long term (current) use of anticoagulants: Secondary | ICD-10-CM

## 2023-03-31 LAB — POCT INR: INR: 1.5 — AB (ref 2.0–3.0)

## 2023-04-07 ENCOUNTER — Ambulatory Visit (INDEPENDENT_AMBULATORY_CARE_PROVIDER_SITE_OTHER): Admitting: Internal Medicine

## 2023-04-07 DIAGNOSIS — Z7901 Long term (current) use of anticoagulants: Secondary | ICD-10-CM

## 2023-04-07 LAB — POCT INR: INR: 3.7 — AB (ref 2.0–3.0)

## 2023-04-20 ENCOUNTER — Ambulatory Visit (INDEPENDENT_AMBULATORY_CARE_PROVIDER_SITE_OTHER): Admitting: Cardiology

## 2023-04-20 DIAGNOSIS — Z7901 Long term (current) use of anticoagulants: Secondary | ICD-10-CM

## 2023-04-20 LAB — POCT INR: INR: 2.8 (ref 2.0–3.0)

## 2023-05-04 ENCOUNTER — Ambulatory Visit: Attending: Cardiology | Admitting: Cardiology

## 2023-05-04 ENCOUNTER — Encounter: Payer: Self-pay | Admitting: Cardiology

## 2023-05-04 VITALS — BP 132/78 | HR 65 | Ht 59.0 in | Wt 121.2 lb

## 2023-05-04 DIAGNOSIS — I6523 Occlusion and stenosis of bilateral carotid arteries: Secondary | ICD-10-CM

## 2023-05-04 DIAGNOSIS — I480 Paroxysmal atrial fibrillation: Secondary | ICD-10-CM | POA: Diagnosis not present

## 2023-05-04 DIAGNOSIS — E782 Mixed hyperlipidemia: Secondary | ICD-10-CM | POA: Diagnosis not present

## 2023-05-04 DIAGNOSIS — I10 Essential (primary) hypertension: Secondary | ICD-10-CM

## 2023-05-04 NOTE — Patient Instructions (Signed)
 Medication Instructions:  Your physician recommends that you continue on your current medications as directed. Please refer to the Current Medication list given to you today.  *If you need a refill on your cardiac medications before your next appointment, please call your pharmacy*  Lab Work: No labs ordered today  If you have labs (blood work) drawn today and your tests are completely normal, you will receive your results only by: MyChart Message (if you have MyChart) OR A paper copy in the mail If you have any lab test that is abnormal or we need to change your treatment, we will call you to review the results.  Testing/Procedures: No test ordered today   Follow-Up: At [facility], you and your health needs are our priority.  As part of our continuing mission to provide you with exceptional heart care, our providers are all part of one team.  This team includes your primary Cardiologist (physician) and Advanced Practice Providers or APPs (Physician Assistants and Nurse Practitioners) who all work together to provide you with the care you need, when you need it.  Your next appointment:   6 month(s)  Provider:   You may see Timothy Gollan, MD or one of the following Advanced Practice Providers on your designated Care Team:   Laneta Pintos, NP Gildardo Labrador, PA-C Varney Gentleman, PA-C Cadence Roselle, PA-C Ronald Cockayne, NP Morey Ar, NP    We recommend signing up for the patient portal called "MyChart".  Sign up information is provided on this After Visit Summary.  MyChart is used to connect with patients for Virtual Visits (Telemedicine).  Patients are able to view lab/test results, encounter notes, upcoming appointments, etc.  Non-urgent messages can be sent to your provider as well.   To learn more about what you can do with MyChart, go to [email or web address].

## 2023-05-04 NOTE — Progress Notes (Signed)
 Cardiology Office Note:  .   Date:  05/04/2023  ID:  Brandi [name], DOB 1940/12/13, MRN [id number] PCP: Tita Form, MD  [facility] Providers Cardiologist:  Belva Boyden, MD    History of Present Illness: .   Brandi [name] is a 83 y.o. female with past medical history of hypertension, hyperlipidemia, neuromuscular disorder, venous insufficiency status post ablation of the right greater saphenous, and atrial flutter who is here today to follow-up atrial flutter.   She was admitted 07/07/2021 for acute hypoxic respiratory failure secondary to pneumonia after presenting with shortness of breath cough for 3 days.  To be placed on 4 L of O2 via nasal cannula as she was hypoxic with oxygen saturations of 85%.  BNP was elevated at 63.  Chest x-ray showed patchy airspace disease consistent with bilateral pneumonias follow-up on effusion.  She was noted to have AKI with creatinine around 1.5 admission felt to be due to poor p.o. intake.  Troponin was mildly elevated and flat at 66 and 45 consistent with demand ischemia.  Initial EKG showed normal sinus rhythm with no acute ischemic changes.  Echocardiogram revealed an LVEF of 60 to 65% no RWMA, G1 DD, normal RV, moderate biatrial enlargement, and moderate elevated PASP.  She was treated with antibiotics.  Charlcie Conger 07/08/2021 she was noted to COVID atrial flutter with RVR.  Repeat EKG showed atrial flutter with variable AV block.  Her carvedilol was changed to metoprolol for better rate control calcium channel blocker was switched to oral Cardizem twice daily and she was continued on apixaban for stroke prophylaxis for CHA2DS2-VASc score of at least 3.  TSH and echocardiogram remained stable.  She was considered stable for discharge and was discharged on 07/12/2021.  She presented back to [facility] emergency department with altered mental status and was hospitalized 7/27-7/29/2023.  She was admitted for further workup and management.  Urinalysis  revealed many bacteria.  Culture revealed multiple organisms.  Patient was volume depleted.  She was adequately rehydrated.  CT scan of the head came back negative.  MRI of the brain also showed negative.  Patient was back to baseline and discharged back to the care of her primary care provider.  She was readmitted to [facility] 8/06/26/09/2020 for after a mechanical fall.  Where she was notably unable to ambulate due to instability and pain.  She had a closed traumatic fracture of multiple bones in the right hip and pelvis.  She was evaluated by orthopedics who recommends nonoperative management, weightbearing as tolerated with outpatient follow-up with orthopedics in 3 weeks.  PT and OT eval was ongoing the patient remains markedly weak secondary to pain requiring 2+ assistance still despite analgesics and support with a walker.  She was discharged to skilled nursing facility for continued rehab.  She presents, and was admitted to [facility] from 10/22 - 11/16/2022 after sustaining a fall and found to have a left intertrochanteric fracture she ended up being sent from [facility] to [facility] for the OR availability.  Her INR was reversed and the patient underwent ORIF.  Postoperative course was complicated by urinary retention and she had to have a Foley catheter placed.  She was to follow-up with orthopedics in 2 weeks for was restarted on Coumadin and found to have therapeutic INR.  Palliative was following she was discharged again on to skilled nursing facility.   She returns to clinic today accompanied by family member.  She states that she  has not had any further hospitalizations since October when she fell and required surgery.  She denies any chest pain or shortness of breath.  She continues to have chronic swelling to bilateral lower extremities where she does continue to elevate her extremities.  She has been continued on warfarin without any missed doses and denies any  bleeding with no blood noted in her urine or stool.  She has been compliant with her current medication regimen without any adverse side effects.  She does continue to follow with Coumadin clinic.  ROS: 10 point review of systems has been reviewed and considered negative except ones been listed in the HPI  Studies Reviewed: .       Echocardiogram 07/09/2021: Impressions: 1. Left ventricular ejection fraction, by estimation, is 60 to 65%. The  left ventricle has normal function. The left ventricle has no regional  wall motion abnormalities. Left ventricular diastolic parameters are  consistent with Grade I diastolic  dysfunction (impaired relaxation).   2. Right ventricular systolic function is normal. The right ventricular  size is normal. There is moderately elevated pulmonary artery systolic  pressure.   3. Left atrial size was moderately dilated.   4. Right atrial size was moderately dilated.   5. The mitral valve is normal in structure. Trivial mitral valve  regurgitation. No evidence of mitral stenosis.   6. The aortic valve is tricuspid. There is mild calcification of the  aortic valve. Aortic valve regurgitation is trivial. Aortic valve  sclerosis/calcification is present, without any evidence of aortic  stenosis.   7. The inferior vena cava is normal in size with greater than 50%  respiratory variability, suggesting right atrial pressure of 3 mmHg. Risk Assessment/Calculations:    CHA2DS2-VASc Score = 4   This indicates a 4.8% annual risk of stroke. The patient's score is based upon: CHF History: 0 HTN History: 1 Diabetes History: 0 Stroke History: 0 Vascular Disease History: 0 Age Score: 2 Gender Score: 1            Physical Exam:   VS:  BP 132/78 (BP Location: Right Arm, Patient Position: Sitting, Cuff Size: Normal)   Pulse 65   Ht 4\' 11"  (1.499 m)   Wt 121 lb 3.2 oz (55 kg)   SpO2 97%   BMI 24.48 kg/m    Wt Readings from Last 3 Encounters:  05/04/23 121 lb  3.2 oz (55 kg)  11/11/22 143 lb 15.4 oz (65.3 kg)  08/25/22 144 lb (65.3 kg)    GEN: Well nourished, well developed in no acute distress NECK: No JVD; No carotid bruits CARDIAC: RRR, no murmurs, rubs, gallops RESPIRATORY:  Clear to auscultation without rales, wheezing or rhonchi  ABDOMEN: Soft, non-tender, non-distended EXTREMITIES:  No edema; No deformity   ASSESSMENT AND PLAN: .   Paroxysmal atrial fibrillation she denies any recent episodes of A-fib.  She is continued on warfarin for CHA2DS2-VASc score of at least 4 for stroke prophylaxis and diltiazem 60 mg twice daily as well as Toprol-XL 25 mg in the morning and at bedtime.  She states she has had no bleeding or complications from blood thinner and does not have any missed doses continues to follow-up with Coumadin clinic with last INR being 2.8.  Mixed hyperlipidemia with a history of statin intolerance.  She has been able to tolerate rosuvastatin 10 mg Sunday and Wednesday.  Requesting labs from her PCP.  Will need an updated lipid panel on return if still not completed.  Primary hypertension  with a blood pressure of 140/80 and a repeat of 132/78.  She is continued on diltiazem and Toprol-XL.  She has been encouraged to continue to monitor pressures 1 to 2 hours postmedication administration at home as well.  Carotid stenosis bilaterally with last carotid duplex completed in 06/2020 revealing 1-39% stenosis in the right and left ICA.  Continued on warfarin and lieu of aspirin and rosuvastatin.       Dispo: Patient to return to clinic with MD/APP in 6 months or sooner if needed.  Signed, Belenda Alviar, NP

## 2023-05-06 ENCOUNTER — Ambulatory Visit (INDEPENDENT_AMBULATORY_CARE_PROVIDER_SITE_OTHER)

## 2023-05-06 DIAGNOSIS — Z5181 Encounter for therapeutic drug level monitoring: Secondary | ICD-10-CM

## 2023-05-06 LAB — POCT INR: INR: 2.2 (ref 2.0–3.0)

## 2023-05-06 NOTE — Patient Instructions (Signed)
 Description   Spoke with Brandi [name] [facility] Instructed to continue 0.5 tablet daily, except 1 tablet on Wednesdays. Recheck INR in 2 weeks  Coumadin Clinic [phone]

## 2023-05-07 ENCOUNTER — Telehealth: Payer: Self-pay | Admitting: *Deleted

## 2023-05-07 NOTE — Telephone Encounter (Signed)
 Spoke to patient to confirm upcoming afternoon [facility] clinic appointment on 4/23, paperwork will be sent via [employer].  Gave location and time, also informed patient that the surgeon's office would be calling as well to get information from them similar to the packet that they will be receiving so make sure to do both.  Reminded patient that all providers will be coming to the clinic to see them HERE and if they had any questions to not hesitate to reach back out to myself or their navigators.

## 2023-05-10 ENCOUNTER — Encounter: Payer: Self-pay | Admitting: *Deleted

## 2023-05-10 DIAGNOSIS — C50811 Malignant neoplasm of overlapping sites of right female breast: Secondary | ICD-10-CM | POA: Insufficient documentation

## 2023-05-12 ENCOUNTER — Inpatient Hospital Stay

## 2023-05-12 ENCOUNTER — Ambulatory Visit
Admission: RE | Admit: 2023-05-12 | Discharge: 2023-05-12 | Disposition: A | Discharge status: Home / Self Care | Source: Ambulatory Visit | Attending: Radiation Oncology | Admitting: Radiation Oncology

## 2023-05-12 ENCOUNTER — Ambulatory Visit: Admitting: Physical Therapy

## 2023-05-12 ENCOUNTER — Inpatient Hospital Stay: Attending: Hematology | Admitting: Hematology

## 2023-05-12 ENCOUNTER — Encounter: Payer: Self-pay | Admitting: Hematology

## 2023-05-12 ENCOUNTER — Encounter: Payer: Self-pay | Admitting: *Deleted

## 2023-05-12 ENCOUNTER — Telehealth: Payer: Self-pay | Admitting: Genetic Counselor

## 2023-05-12 VITALS — BP 116/78 | HR 65 | Temp 97.9°F | Resp 21 | Ht 59.0 in | Wt 118.1 lb

## 2023-05-12 DIAGNOSIS — Z993 Dependence on wheelchair: Secondary | ICD-10-CM | POA: Diagnosis not present

## 2023-05-12 DIAGNOSIS — Z1721 Progesterone receptor positive status: Secondary | ICD-10-CM | POA: Diagnosis not present

## 2023-05-12 DIAGNOSIS — Z8701 Personal history of pneumonia (recurrent): Secondary | ICD-10-CM | POA: Insufficient documentation

## 2023-05-12 DIAGNOSIS — I4891 Unspecified atrial fibrillation: Secondary | ICD-10-CM | POA: Insufficient documentation

## 2023-05-12 DIAGNOSIS — Z1732 Human epidermal growth factor receptor 2 negative status: Secondary | ICD-10-CM | POA: Diagnosis not present

## 2023-05-12 DIAGNOSIS — Z7901 Long term (current) use of anticoagulants: Secondary | ICD-10-CM | POA: Diagnosis not present

## 2023-05-12 DIAGNOSIS — Z170 Estrogen receptor positive status [ER+]: Secondary | ICD-10-CM | POA: Diagnosis not present

## 2023-05-12 DIAGNOSIS — R296 Repeated falls: Secondary | ICD-10-CM | POA: Diagnosis not present

## 2023-05-12 DIAGNOSIS — C50811 Malignant neoplasm of overlapping sites of right female breast: Secondary | ICD-10-CM | POA: Diagnosis not present

## 2023-05-12 DIAGNOSIS — Z8781 Personal history of (healed) traumatic fracture: Secondary | ICD-10-CM | POA: Diagnosis not present

## 2023-05-12 MED ORDER — ANASTROZOLE 1 MG PO TABS: 1.0000 mg | ORAL_TABLET | Freq: Every day | ORAL | 3 refills | Status: DC

## 2023-05-12 NOTE — Progress Notes (Signed)
 [facility]   [phone] Fax:([phone]   Clinic New Consult Note   Patient Care Team: Tita Form, MD as PCP - General (Internal Medicine) Jerelene Monday Deadra Everts, MD as PCP - Cardiology (Cardiology) Alane Hsu, RN as Oncology Nurse Navigator Auther Bo, RN as Oncology Nurse Navigator Lockie Rima, MD as Consulting Physician (General Surgery) Sonja Corry, MD as Consulting Physician (Hematology) Retta Caster, MD as Consulting Physician (Radiation Oncology) 05/12/2023  CHIEF COMPLAINTS/PURPOSE OF CONSULTATION:  Right breast cancer.  REFERRING PHYSICIAN: [facility]   Discussed the use of AI scribe software for clinical note transcription with the patient, who gave verbal consent to proceed.  History of Present Illness The patient, an 83 year old female with a history of multiple fractures, atrial fibrillation, and mild memory loss, presents with a newly diagnosed breast cancer.  She was referred to by [facility].  She presents to our multidisciplinary breast opaquely today with her husband.  She came in a wheelchair.    She noticed a lump around her right breast nipple about two years ago. However, due to multiple hospitalizations for pneumonia and fractures and other health issues, she did not seek immediate medical attention for the lump.   The patient has been mostly wheelchair-bound at home due to her fractures and has lost about 35 pounds since her pneumonia about two years ago.  Her husband reports significant health declining since her pneumonia.  She had multiple falls at home and was hospitalized twice last year for fractures.  She denies any other new symptoms such as chest discomfort, cough, shortness of breath, stomach issues, or changes in bowel movements. Her appetite is good. She has a history of high blood pressure and is currently on Coumadin  for atrial fibrillation. She also reports occasional memory loss.     MEDICAL HISTORY:  Past Medical  History:  Diagnosis Date   Allergy    Atrial fibrillation (HCC)    Dysrhythmia    Hyperlipidemia    Hypertension    Neuromuscular disorder (HCC)    Venous insufficiency     SURGICAL HISTORY: Past Surgical History:  Procedure Laterality Date   ANKLE SURGERY Left    INTRAMEDULLARY (IM) NAIL INTERTROCHANTERIC Left 11/11/2022   Procedure: INTRAMEDULLARY (IM) NAIL INTERTROCHANTERIC;  Surgeon: Murleen Arms, MD;  Location: [facility];  Service: Orthopedics;  Laterality: Left;   KNEE SURGERY Left    VARICOSE VEIN SURGERY      SOCIAL HISTORY: Social History   Socioeconomic History   Marital status: Married    Spouse name: Not on file   Number of children: 3   Years of education: Not on file   Highest education level: Not on file  Occupational History   Not on file  Tobacco Use   Smoking status: Never   Smokeless tobacco: Never   Tobacco comments:    Never smoke 07/17/21  Vaping Use   Vaping status: Never Used  Substance and Sexual Activity   Alcohol  use: Never   Drug use: Never   Sexual activity: Not on file  Other Topics Concern   Not on file  Social History Narrative   Not on file   Social Drivers of Health   Financial Resource Strain: Not on file  Food Insecurity: Patient Unable To Answer (11/11/2022)   Hunger Vital Sign    Worried About Running Out of Food in the Last Year: Patient unable to answer    Ran Out of Food in the Last Year: Patient unable to  answer  Transportation Needs: Patient Unable To Answer (11/11/2022)   PRAPARE - Transportation    Lack of Transportation (Medical): Patient unable to answer    Lack of Transportation (Non-Medical): Patient unable to answer  Physical Activity: Not on file  Stress: Not on file  Social Connections: Not on file  Intimate Partner Violence: Patient Unable To Answer (11/11/2022)   Humiliation, Afraid, Rape, and Kick questionnaire    Fear of Current or Ex-Partner: Patient unable to answer    Emotionally Abused:  Patient unable to answer    Physically Abused: Patient unable to answer    Sexually Abused: Patient unable to answer    FAMILY HISTORY: Family History  Problem Relation Age of Onset   Heart attack Father     ALLERGIES:  is allergic to amoxil [amoxicillin] and celebrex [celecoxib].  MEDICATIONS:  Current Outpatient Medications  Medication Sig Dispense Refill   acetaminophen  (TYLENOL ) 500 MG tablet Take 1,000 mg by mouth every 6 (six) hours as needed for mild pain.     anastrozole  (ARIMIDEX ) 1 MG tablet Take 1 tablet (1 mg total) by mouth daily. 30 tablet 3   azelastine (ASTELIN) 0.1 % nasal spray Place 2 sprays into both nostrils daily as needed for rhinitis or allergies.     cetirizine (ZYRTEC) 10 MG tablet Take 10 mg by mouth daily as needed for allergies.     clotrimazole-betamethasone (LOTRISONE) cream Apply 1 Application topically as needed (rash).     diltiazem  (CARDIZEM  SR) 60 MG 12 hr capsule Take 60 mg by mouth every 12 (twelve) hours.     ferrous sulfate  325 (65 FE) MG tablet Take 325 mg by mouth daily with breakfast.     guanFACINE  (TENEX ) 2 MG tablet Take 2 mg by mouth every evening.     Lidocaine  HCl (ASPERCREME LIDOCAINE ) 4 % LIQD Apply 1 Application topically 2 (two) times daily as needed (knee pain).     metoprolol  succinate (TOPROL -XL) 50 MG 24 hr tablet Take 25 mg by mouth in the morning and at bedtime. Take with or immediately following a meal.     Multiple Vitamins-Minerals (CENTRUM SILVER 50+WOMEN) TABS Take 1 tablet by mouth every evening.     pantoprazole  (PROTONIX ) 40 MG tablet Take 1 tablet (40 mg total) by mouth daily. 30 tablet 0   polyethylene glycol (MIRALAX  / GLYCOLAX ) 17 g packet Take 17 g by mouth daily. (Patient taking differently: Take 17 g by mouth as needed for moderate constipation.)     rosuvastatin  (CRESTOR ) 10 MG tablet Take 10 mg by mouth See admin instructions. Take 10 mg by mouth at bedtime on Sun/Wed only (twice a week)     senna-docusate  (SENOKOT-S) 8.6-50 MG tablet Take 1 tablet by mouth at bedtime as needed for mild constipation. 30 tablet 0   sertraline  (ZOLOFT ) 50 MG tablet Take 75 mg by mouth at bedtime.     spironolactone (ALDACTONE) 25 MG tablet Take 25 mg by mouth daily.     SYSTANE COMPLETE PF 0.6 % SOLN Place 1 drop into both eyes in the morning, at noon, and at bedtime.     warfarin (JANTOVEN ) 2 MG tablet TAKE 1/2 TO 1 TABLET (1 TO 2MG ) DAILY OR AS DIRECTED  BY ANTICOAGULATION CLINIC 50 tablet 1   No current facility-administered medications for this visit.    REVIEW OF SYSTEMS:   Constitutional: Denies fevers, chills or abnormal night sweats, (+) weight loss and fatigue  Eyes: Denies blurriness of vision, double vision or  watery eyes Ears, nose, mouth, throat, and face: Denies mucositis or sore throat Respiratory: Denies cough, dyspnea or wheezes Cardiovascular: Denies palpitation, chest discomfort or lower extremity swelling Gastrointestinal:  Denies nausea, heartburn or change in bowel habits Skin: Denies abnormal skin rashes Lymphatics: Denies new lymphadenopathy or easy bruising Neurological:Denies numbness, tingling or new weaknesses Behavioral/Psych: Mood is stable, no new changes  All other systems were reviewed with the patient and are negative.  PHYSICAL EXAMINATION: ECOG PERFORMANCE STATUS: 2 - Symptomatic, <50% confined to bed  Vitals:   05/12/23 1323  BP: 116/78  Pulse: 65  Resp: (!) 21  Temp: 97.9 F (36.6 C)  SpO2: 98%   Filed Weights   05/12/23 1323  Weight: 118 lb 1.6 oz (53.6 kg)    GENERAL:alert, no distress and comfortable SKIN: skin color, texture, turgor are normal, no rashes or significant lesions EYES: normal, conjunctiva are pink and non-injected, sclera clear OROPHARYNX:no exudate, no erythema and lips, buccal mucosa, and tongue normal  NECK: supple, thyroid  normal size, non-tender, without nodularity LYMPH:  no palpable lymphadenopathy in the cervical, axillary or  inguinal LUNGS: clear to auscultation and percussion with normal breathing effort HEART: regular rate & rhythm and no murmurs and no lower extremity edema ABDOMEN:abdomen soft, non-tender and normal bowel sounds Musculoskeletal:no cyanosis of digits and no clubbing  PSYCH: alert & oriented x 3 with fluent speech NEURO: no focal motor/sensory deficits Breasts: Breast inspection showed them to be symmetrical with no nipple discharge. There is a 3 x 2 cm mass with skin ulcer at 9 o'clock position of the right breast, 2 to 3 cm from nipple. palpation of the left breast and axilla revealed no obvious mass that I could appreciate.   Physical Exam   LABORATORY DATA:  I have reviewed the data as listed    Latest Ref Rng & Units 11/15/2022    3:56 AM 11/14/2022    3:49 AM 11/13/2022    2:41 PM  CBC  WBC 4.0 - 10.5 K/uL 8.7  9.9    Hemoglobin 12.0 - 15.0 g/dL 9.1  9.3  8.1   Hematocrit 36.0 - 46.0 % 29.3  29.6  25.5   Platelets 150 - 400 K/uL 177  177        Latest Ref Rng & Units 11/14/2022    3:49 AM 11/13/2022    3:32 AM 11/12/2022    3:47 AM  CMP  Glucose 70 - 99 mg/dL 478  295  621   BUN 8 - 23 mg/dL 25  32  29   Creatinine 0.44 - 1.00 mg/dL 3.08  6.57  8.46   Sodium 135 - 145 mmol/L 139  131  135   Potassium 3.5 - 5.1 mmol/L 3.7  3.9  4.1   Chloride 98 - 111 mmol/L 106  101  103   CO2 22 - 32 mmol/L 23  23  22    Calcium  8.9 - 10.3 mg/dL 8.6  7.7  7.9   Total Protein 6.5 - 8.1 g/dL   5.3   Total Bilirubin 0.3 - 1.2 mg/dL   0.4   Alkaline Phos 38 - 126 U/L   70   AST 15 - 41 U/L   19   ALT 0 - 44 U/L   14      RADIOGRAPHIC STUDIES: I have personally reviewed the radiological images as listed and agreed with the findings in the report. No results found.   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 Tumor size 3.3 cm at 9 o'clock position and 0.6 cm at 11 o'clock position with suspected lymph node involvement. Stage IIIB. Chemotherapy is not recommended due to her age and health status. Anti-estrogen therapy is advised due to strong ER and PR positivity.  -Surgery is deferred initially due to concerns about tolerance.  - Her CT scan from August 2024 was negative for metastasis.  A bone scan is planned to rule out metastasis.  -Anti-estrogen therapy is expected to control the cancer temporarily but not cure it. Surgery may be reconsidered if the tumor shrinks and her condition allows. - Prescribe anastrozole  for anti-estrogen therapy. - Order bone scan to assess for metastasis. - Coordinate with pharmacy to fill prescription at [facility] on [address]. - Schedule follow-up appointment in 2 to 3 months to assess response to therapy. - Discuss potential side effects of anastrozole , including hot flashes, joint pain, and metabolic changes. - Consider surgery if tumor shrinks and her condition allows. - Discuss research study participation involving a one-time blood draw.  Atrial fibrillation Atrial fibrillation managed with Coumadin . No acute issues discussed during this visit.  History of pneumonia Pneumonia in June 2023, which was a trigger for subsequent health decline. No current respiratory symptoms reported.  History of hip fracture Left hip fracture with surgical intervention and rehabilitation. Currently using a wheelchair and able to perform self-care with some assistance. No significant pain reported, but weather affects discomfort.  Plan - I reviewed imaging and biopsy results - Due to her locally advanced disease, I recommend Anabelen Kaminsky scan to rule  out distant metastasis. - I called in anastrozole  1 mg daily, she will start this week - Will see her back in 2 to 3 weeks, to evaluate her clinical response.  - Surgery may be considered in 3 to 6 months   No orders of the defined types were placed in this encounter.   All questions were answered. The patient knows to call the clinic with any problems, questions or concerns. I spent 50 minutes counseling the patient face to face. The total time spent in the appointment was 60 minutes and more than 50% was on counseling.     Sonja , MD 05/12/2023

## 2023-05-12 NOTE — Telephone Encounter (Signed)
 Brandi [name] was seen by a genetic counselor during the breast multidisciplinary clinic on May 12, 2023. She did not report any family history of cancer.   She does not meet NCCN criteria for genetic testing at this time. She was still offered genetic counseling and testing but declined. We encourage her to contact us  if there are any changes to her personal or family history of cancer. If she meets NCCN criteria based on the updated personal/family history, she would be recommended to have genetic counseling and testing.   Brandi [name] M. Ora Billing, MS, [facility] Genetic Counselor [name] (P) [phone]

## 2023-05-12 NOTE — Progress Notes (Signed)
 Radiation Oncology         [phone] ________________________________  Multidisciplinary Breast Oncology Clinic [facility]) Initial Outpatient Consultation  Name: Brandi [name] MRN: [id number]  Date: 05/12/2023  DOB: Jun 25, 1940  WU:[id number], Dann Dust, MD  Lockie Rima, MD   REFERRING PHYSICIAN: Lockie Rima, MD  DIAGNOSIS: The encounter diagnosis was Malignant neoplasm of overlapping sites of right breast in female, estrogen receptor positive (HCC).  Stage IB (cT2, cN0, cM0) Overlapping sites of the Right Breast, Invasive ductal carcinoma with focal DCIS, ER+ / PR+ / Her2-, Grade 2    ICD-10-CM   1. Malignant neoplasm of overlapping sites of right breast in female, estrogen receptor positive (HCC)  C50.811    Z17.0       HISTORY OF PRESENT ILLNESS::Brandi [name] is a 83 y.o. female who is presenting to the office today for evaluation of her newly diagnosed breast cancer. She is accompanied by her husband and they are both in wheel chairs. She is doing well overall.   She had routine screening mammography on 03/15/23 showing 2 possible abnormalities in the right breast. She underwent a right breast diagnostic mammography with tomography and right breast ultrasonography at [facility] on 04/28/23 showing: a suspicious fungating 3 cm mass in the 9 o'clock right breast located 2 cmfn, with the deep portion of the mass having hypoechoic tendril like areas extending towards the right nipple, and a 0.7 cm firm palpable mass in the 11 o'clock right breast also suspicious of malignancy.  Lastly there is an indeterminate right axillary lymph node measuring 4.3 mm.   Biopsies of the right breast collected on 04/16//25 are detailed as follows:  -- Biopsy of the 9 o'clock right breast showed grade 2 invasive ductal carcinoma measuring 1.2 cm in the greatest linear extent of the sample with intermediate grade focal DCIS and focal angiolymphatic invasion present. Prognostic indicators significant for:  estrogen receptor, 90% positive and progesterone receptor, 70% positive, both with strong staining intensity. Proliferation marker Ki67 at 5%. HER2 negative. -- Biopsy of the 1 o'clock right breast showed benign findings including stromal fibrosis.  -- Biopsy of the 11 o'clock right breast showed grade 2 invasive ductal carcinoma measuring 5.5 mm in the greatest linear extent of the sample with focal intermediate grade DCIS and with focal angiolymphatic invasion    Menarche: 83 years old Age at first live birth: 83 years old GP: 3 LMP: approximately 67 Contraceptive: never used HRT: never used   The patient was referred today for presentation in the multidisciplinary conference.  Radiology studies and pathology slides were presented there for review and discussion of treatment options.  A consensus was discussed regarding potential next steps.  PREVIOUS RADIATION THERAPY: No  PAST MEDICAL HISTORY:  Past Medical History:  Diagnosis Date   Allergy    Atrial fibrillation (HCC)    Dysrhythmia    Hyperlipidemia    Hypertension    Neuromuscular disorder (HCC)    Venous insufficiency     PAST SURGICAL HISTORY: Past Surgical History:  Procedure Laterality Date   ANKLE SURGERY Left    INTRAMEDULLARY (IM) NAIL INTERTROCHANTERIC Left 11/11/2022   Procedure: INTRAMEDULLARY (IM) NAIL INTERTROCHANTERIC;  Surgeon: Murleen Arms, MD;  Location: [facility];  Service: Orthopedics;  Laterality: Left;   KNEE SURGERY Left    VARICOSE VEIN SURGERY      FAMILY HISTORY:  Family History  Problem Relation Age of Onset   Heart attack Father     SOCIAL HISTORY:  Social History  Socioeconomic History   Marital status: Married    Spouse name: Not on file   Number of children: 3   Years of education: Not on file   Highest education level: Not on file  Occupational History   Not on file  Tobacco Use   Smoking status: Never   Smokeless tobacco: Never   Tobacco comments:    Never smoke  07/17/21  Vaping Use   Vaping status: Never Used  Substance and Sexual Activity   Alcohol  use: Never   Drug use: Never   Sexual activity: Not on file  Other Topics Concern   Not on file  Social History Narrative   Not on file   Social Drivers of Health   Financial Resource Strain: Not on file  Food Insecurity: Patient Unable To Answer (11/11/2022)   Hunger Vital Sign    Worried About Running Out of Food in the Last Year: Patient unable to answer    Ran Out of Food in the Last Year: Patient unable to answer  Transportation Needs: Patient Unable To Answer (11/11/2022)   PRAPARE - Administrator, Civil Service (Medical): Patient unable to answer    Lack of Transportation (Non-Medical): Patient unable to answer  Physical Activity: Not on file  Stress: Not on file  Social Connections: Not on file    ALLERGIES:  Allergies  Allergen Reactions   Amoxil [Amoxicillin] Diarrhea   Celebrex [Celecoxib] Other (See Comments)    Bruising and stomach pain    MEDICATIONS:  Current Outpatient Medications  Medication Sig Dispense Refill   acetaminophen  (TYLENOL ) 500 MG tablet Take 1,000 mg by mouth every 6 (six) hours as needed for mild pain.     anastrozole  (ARIMIDEX ) 1 MG tablet Take 1 tablet (1 mg total) by mouth daily. 30 tablet 3   azelastine (ASTELIN) 0.1 % nasal spray Place 2 sprays into both nostrils daily as needed for rhinitis or allergies.     cetirizine (ZYRTEC) 10 MG tablet Take 10 mg by mouth daily as needed for allergies.     clotrimazole-betamethasone (LOTRISONE) cream Apply 1 Application topically as needed (rash).     diltiazem  (CARDIZEM  SR) 60 MG 12 hr capsule Take 60 mg by mouth every 12 (twelve) hours.     ferrous sulfate  325 (65 FE) MG tablet Take 325 mg by mouth daily with breakfast.     guanFACINE  (TENEX ) 2 MG tablet Take 2 mg by mouth every evening.     Lidocaine  HCl (ASPERCREME LIDOCAINE ) 4 % LIQD Apply 1 Application topically 2 (two) times daily as  needed (knee pain).     metoprolol  succinate (TOPROL -XL) 50 MG 24 hr tablet Take 25 mg by mouth in the morning and at bedtime. Take with or immediately following a meal.     Multiple Vitamins-Minerals (CENTRUM SILVER 50+WOMEN) TABS Take 1 tablet by mouth every evening.     pantoprazole  (PROTONIX ) 40 MG tablet Take 1 tablet (40 mg total) by mouth daily. 30 tablet 0   polyethylene glycol (MIRALAX  / GLYCOLAX ) 17 g packet Take 17 g by mouth daily. (Patient taking differently: Take 17 g by mouth as needed for moderate constipation.)     rosuvastatin  (CRESTOR ) 10 MG tablet Take 10 mg by mouth See admin instructions. Take 10 mg by mouth at bedtime on Sun/Wed only (twice a week)     senna-docusate (SENOKOT-S) 8.6-50 MG tablet Take 1 tablet by mouth at bedtime as needed for mild constipation. 30 tablet 0   sertraline  (  ZOLOFT ) 50 MG tablet Take 75 mg by mouth at bedtime.     spironolactone (ALDACTONE) 25 MG tablet Take 25 mg by mouth daily.     SYSTANE COMPLETE PF 0.6 % SOLN Place 1 drop into both eyes in the morning, at noon, and at bedtime.     warfarin (JANTOVEN ) 2 MG tablet TAKE 1/2 TO 1 TABLET (1 TO 2MG ) DAILY OR AS DIRECTED  BY ANTICOAGULATION CLINIC 50 tablet 1   No current facility-administered medications for this encounter.    REVIEW OF SYSTEMS: A 10+ POINT REVIEW OF SYSTEMS WAS OBTAINED including neurology, dermatology, psychiatry, cardiac, respiratory, lymph, extremities, GI, GU, musculoskeletal, constitutional, reproductive, HEENT. On the provided form, she reports fatigue, wearing glasses, feet swelling, poor circulation, sleeping with 2 pillows, a dry cough, a breast lump, bruising easily, arthritis, ambulatory difficulties, forgetfulness. She denies any other symptoms.    PHYSICAL EXAM:     05/12/2023  Vitals with BMI   Height 4\' 11"    Weight 118 lbs 2 oz   BMI 23.84   Systolic 116   Diastolic 78   Pulse 65    Lungs are clear to auscultation bilaterally. Heart has regular rate and  rhythm. No palpable cervical, supraclavicular, or axillary adenopathy. Abdomen soft, non-tender, normal bowel sounds. Breast: Left breast with no palpable mass, nipple discharge, or bleeding. Right breast with extensive bruising from biopsies and with an area of ulceration just lateral to the areolar boarder measuring 2.0 x 2.5 cm in size.    KPS = 50  100 - Normal; no complaints; no evidence of disease. 90   - Able to carry on normal activity; minor signs or symptoms of disease. 80   - Normal activity with effort; some signs or symptoms of disease. 37   - Cares for self; unable to carry on normal activity or to do active work. 60   - Requires occasional assistance, but is able to care for most of his personal needs. 50   - Requires considerable assistance and frequent medical care. 40   - Disabled; requires special care and assistance. 30   - Severely disabled; hospital admission is indicated although death not imminent. 20   - Very sick; hospital admission necessary; active supportive treatment necessary. 10   - Moribund; fatal processes progressing rapidly. 0     - Dead  Karnofsky DA, Abelmann WH, Craver LS and Burchenal [facility] [phone]) The use of the nitrogen mustards in the palliative treatment of carcinoma: with particular reference to bronchogenic carcinoma Cancer 1 634-56  LABORATORY DATA:  Lab Results  Component Value Date   WBC 8.7 11/15/2022   HGB 9.1 (L) 11/15/2022   HCT 29.3 (L) 11/15/2022   MCV 91.0 11/15/2022   PLT 177 11/15/2022   Lab Results  Component Value Date   NA 139 11/14/2022   K 3.7 11/14/2022   CL 106 11/14/2022   CO2 23 11/14/2022   Lab Results  Component Value Date   ALT 14 11/12/2022   AST 19 11/12/2022   ALKPHOS 70 11/12/2022   BILITOT 0.4 11/12/2022    PULMONARY FUNCTION TEST:   Review Flowsheet        No data to display          RADIOGRAPHY: No results found.    IMPRESSION: Stage IB (cT2, cN0, cM0) Overlapping sites of the Right Breast,  Invasive ductal carcinoma with focal DCIS, ER+ / PR+ / Her2-, Grade 2    Patient will proceed with nodal biopsies  directed at the right axilla.  She will then proceed with a bone scan for staging workup and to rule out distant metastasis.  Given her performance status she will proceed with neoadjuvant aromatase inhibitor.  She at a later date will be evaluated for potential surgery.  If she is found to have distant metastasis then hormonal therapy will likely be her only course of treatment unless she develops progression with fungating bleeding mass.  If disease is limited to the breast/axillary area she may be a potential candidate for radiation therapy at a later date although treatments may be very difficult for her given her performance status and limited mobility.  PLAN:  Genetics  Lymph node biopsies (scheduled for 05/01) Bone scan  Neoadjuvant AI Right breast lumpectomy x 2   Adjuvant radiation therapy    ------------------------------------------------  Noralee Beam, PhD, MD  This document serves as a record of services personally performed by Retta Caster, MD. It was created on his behalf by Aleta Anda, a trained medical scribe. The creation of this record is based on the scribe's personal observations and the provider's statements to them. This document has been checked and approved by the attending provider.

## 2023-05-13 ENCOUNTER — Telehealth: Payer: Self-pay | Admitting: Hematology

## 2023-05-14 ENCOUNTER — Encounter: Payer: Self-pay | Admitting: Hematology

## 2023-05-14 ENCOUNTER — Encounter (HOSPITAL_COMMUNITY)
Admission: RE | Admit: 2023-05-14 | Discharge: 2023-05-14 | Disposition: A | Discharge status: Home / Self Care | Source: Ambulatory Visit | Attending: Hematology | Admitting: Hematology

## 2023-05-14 DIAGNOSIS — C50811 Malignant neoplasm of overlapping sites of right female breast: Secondary | ICD-10-CM | POA: Diagnosis present

## 2023-05-14 DIAGNOSIS — Z170 Estrogen receptor positive status [ER+]: Secondary | ICD-10-CM

## 2023-05-14 MED ORDER — TECHNETIUM TC 99M MEDRONATE IV KIT
20.0000 | PACK | Freq: Once | INTRAVENOUS | Status: AC | PRN
  Administered 2023-05-14: 19.3 via INTRAVENOUS

## 2023-05-17 ENCOUNTER — Encounter: Payer: Self-pay | Admitting: Diagnostic Radiology

## 2023-05-17 ENCOUNTER — Telehealth: Payer: Self-pay | Admitting: *Deleted

## 2023-05-17 ENCOUNTER — Encounter: Payer: Self-pay | Admitting: *Deleted

## 2023-05-17 NOTE — Telephone Encounter (Signed)
 Left vm regarding [facility] from 05/12/23. Contact information provided for questions or needs.

## 2023-05-18 ENCOUNTER — Encounter: Payer: Self-pay | Admitting: General Practice

## 2023-05-18 NOTE — Progress Notes (Signed)
 [facility] Multidisciplinary Clinic Spiritual Care Note  Met with Kelsha's husband Cortland Ding by phone following Breast Multidisciplinary Clinic to introduce Support Center team/resources.  Ms Lauro completed SDOH screening; results follow below.    SDOH Screenings   Food Insecurity: No Food Insecurity (05/18/2023)  Housing: Low Risk  (05/18/2023)  Transportation Needs: No Transportation Needs (05/18/2023)  Utilities: Not At Risk (05/18/2023)  Depression (PHQ2-9): Low Risk  (05/18/2023)  Tobacco Use: Low Risk  (05/14/2023)   Received from [facility] and spouse discussed common feelings and emotions when being diagnosed with cancer, and the importance of support during treatment.  Chaplain informed spouse of the support team and support services at [facility].  Chaplain provided contact information and encouraged patient to call with any questions or concerns.  Follow up needed: No. Spouse is aware of ongoing Support Team availability and reports no needs or questions at this time.   [address] Dorice Gardner, [place], [facility] Pager [phone] Voicemail [phone]

## 2023-05-19 ENCOUNTER — Ambulatory Visit (INDEPENDENT_AMBULATORY_CARE_PROVIDER_SITE_OTHER): Admitting: Internal Medicine

## 2023-05-19 DIAGNOSIS — Z7901 Long term (current) use of anticoagulants: Secondary | ICD-10-CM

## 2023-05-19 LAB — POCT INR: INR: 1.5 — AB (ref 2.0–3.0)

## 2023-05-20 ENCOUNTER — Encounter: Payer: Self-pay | Admitting: Hematology

## 2023-05-20 ENCOUNTER — Inpatient Hospital Stay: Attending: Hematology | Admitting: Hematology

## 2023-05-20 DIAGNOSIS — C50811 Malignant neoplasm of overlapping sites of right female breast: Secondary | ICD-10-CM | POA: Diagnosis not present

## 2023-05-20 DIAGNOSIS — Z170 Estrogen receptor positive status [ER+]: Secondary | ICD-10-CM

## 2023-05-20 NOTE — Assessment & Plan Note (Signed)
 invasive ductal carcinoma of breast, mcT4N1M0 stage IIIB.  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 Tumor size 3.3 cm at 9 o'clock position and 0.6 cm at 11 o'clock position with suspected lymph node involvement. Stage IIIB. Chemotherapy is not recommended due to her age and health status. Anti-estrogen therapy is advised due to strong ER and PR positivity.  -Surgery is deferred initially due to concerns about tolerance.  - Her CT scan from August 2024 was negative for metastasis.  A bone scan 05/14/2023 was also negative -she started anastrozole  in late April 2025, tolerating well

## 2023-05-20 NOTE — Progress Notes (Signed)
 [facility]   [phone]) [phone] [phone]   Clinic Follow up Note   Patient Care Team: Tita Form, MD as PCP - General (Internal Medicine) Jerelene Monday Deadra Everts, MD as PCP - Cardiology (Cardiology) Alane Hsu, RN as Oncology Nurse Navigator Auther Bo, RN as Oncology Nurse Navigator Lockie Rima, MD as Consulting Physician (General Surgery) Sonja Cameron, MD as Consulting Physician (Hematology) Retta Caster, MD as Consulting Physician (Radiation Oncology) 05/20/2023  I connected with Brandi [name] on 05/20/23 at  9:20 AM EDT by telephone and verified that I am speaking with the correct person using two identifiers.   I discussed the limitations, risks, security and privacy concerns of performing an evaluation and management service by telephone and the availability of in person appointments. I also discussed with the patient that there may be a patient responsible charge related to this service. The patient expressed understanding and agreed to proceed.   Patient's location: Home Provider's location:  Office    CHIEF COMPLAINT: Follow-up of breast cancer   CURRENT THERAPY: Neoadjuvant anastrozole   Oncology history Malignant neoplasm of overlapping sites of right breast in female, 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 Tumor size 3.3 cm at 9 o'clock position and 0.6 cm at 11 o'clock position with suspected lymph node involvement. Stage IIIB. Chemotherapy is not recommended due to her age and health status. Anti-estrogen therapy is advised due to strong ER and PR positivity.  -Surgery is deferred initially due to concerns about tolerance.  - Her CT scan from August 2024 was negative for metastasis.  A bone scan 05/14/2023 was also negative -she started anastrozole  in late April 2025,  tolerating well    Assessment & Plan Breast cancer Newly diagnosed breast cancer. Currently on anastrozole  (Arimidex ) with no reported issues. Bone scan shows no evidence of bone metastasis. Anticipate slight tumor size reduction in 2-3 months with anastrozole . - Ensure daily intake of anastrozole  (Arimidex ). - Schedule follow-up appointment on July 9th at 1:15 PM at [facility].     SUMMARY OF ONCOLOGIC HISTORY: Oncology History  Malignant neoplasm of overlapping sites of right breast in female, estrogen receptor positive (HCC)  05/10/2023 Initial Diagnosis   Malignant neoplasm of overlapping sites of right breast in female, estrogen receptor positive (HCC)   05/12/2023 Cancer Staging   Staging form: Breast, AJCC 8th Edition - Clinical stage from 05/12/2023: Stage IIIB (cT4, cN1, cM0, G2, ER+, PR+, HER2-) - Signed by Sonja Tradewinds, MD on 05/12/2023 Stage prefix: Initial diagnosis Histologic grading system: 3 grade system     Discussed the use of AI scribe software for clinical note transcription with the patient, who gave verbal consent to proceed.  History of Present Illness Brandi [name] is an 83 year old female with newly diagnosed breast cancer who presents for a phone visit to review skin results. She is accompanied by her husband, Brandi [name].  She is currently on anastrozole , taken daily in the evenings, with no adverse effects reported. A recent bone scan shows no evidence of metastasis. The visit is to review skin results in the context of her breast cancer treatment.     REVIEW OF SYSTEMS:   Constitutional: Denies fevers, chills or abnormal weight loss Eyes: Denies blurriness of vision Ears, nose, mouth, throat, and face: Denies mucositis or sore throat Respiratory: Denies cough,  dyspnea or wheezes Cardiovascular: Denies palpitation, chest discomfort or lower extremity swelling Gastrointestinal:  Denies nausea, heartburn or change in bowel habits Skin:  Denies abnormal skin rashes Lymphatics: Denies new lymphadenopathy or easy bruising Neurological:Denies numbness, tingling or new weaknesses Behavioral/Psych: Mood is stable, no new changes  All other systems were reviewed with the patient and are negative.  MEDICAL HISTORY:  Past Medical History:  Diagnosis Date   Allergy    Atrial fibrillation (HCC)    Dysrhythmia    Hyperlipidemia    Hypertension    Neuromuscular disorder (HCC)    Venous insufficiency     SURGICAL HISTORY: Past Surgical History:  Procedure Laterality Date   ANKLE SURGERY Left    INTRAMEDULLARY (IM) NAIL INTERTROCHANTERIC Left 11/11/2022   Procedure: INTRAMEDULLARY (IM) NAIL INTERTROCHANTERIC;  Surgeon: Murleen Arms, MD;  Location: [facility];  Service: Orthopedics;  Laterality: Left;   KNEE SURGERY Left    VARICOSE VEIN SURGERY      I have reviewed the social history and family history with the patient and they are unchanged from previous note.  ALLERGIES:  is allergic to amoxil [amoxicillin] and celebrex [celecoxib].  MEDICATIONS:  Current Outpatient Medications  Medication Sig Dispense Refill   acetaminophen  (TYLENOL ) 500 MG tablet Take 1,000 mg by mouth every 6 (six) hours as needed for mild pain.     anastrozole  (ARIMIDEX ) 1 MG tablet Take 1 tablet (1 mg total) by mouth daily. 30 tablet 3   azelastine (ASTELIN) 0.1 % nasal spray Place 2 sprays into both nostrils daily as needed for rhinitis or allergies.     cetirizine (ZYRTEC) 10 MG tablet Take 10 mg by mouth daily as needed for allergies.     clotrimazole-betamethasone (LOTRISONE) cream Apply 1 Application topically as needed (rash).     diltiazem  (CARDIZEM  SR) 60 MG 12 hr capsule Take 60 mg by mouth every 12 (twelve) hours.     ferrous sulfate  325 (65 FE) MG tablet Take 325 mg by mouth daily with breakfast.     guanFACINE  (TENEX ) 2 MG tablet Take 2 mg by mouth every evening.     Lidocaine  HCl (ASPERCREME LIDOCAINE ) 4 % LIQD Apply 1 Application  topically 2 (two) times daily as needed (knee pain).     metoprolol  succinate (TOPROL -XL) 50 MG 24 hr tablet Take 25 mg by mouth in the morning and at bedtime. Take with or immediately following a meal.     Multiple Vitamins-Minerals (CENTRUM SILVER 50+WOMEN) TABS Take 1 tablet by mouth every evening.     pantoprazole  (PROTONIX ) 40 MG tablet Take 1 tablet (40 mg total) by mouth daily. 30 tablet 0   polyethylene glycol (MIRALAX  / GLYCOLAX ) 17 g packet Take 17 g by mouth daily. (Patient taking differently: Take 17 g by mouth as needed for moderate constipation.)     rosuvastatin  (CRESTOR ) 10 MG tablet Take 10 mg by mouth See admin instructions. Take 10 mg by mouth at bedtime on Sun/Wed only (twice a week)     senna-docusate (SENOKOT-S) 8.6-50 MG tablet Take 1 tablet by mouth at bedtime as needed for mild constipation. 30 tablet 0   sertraline  (ZOLOFT ) 50 MG tablet Take 75 mg by mouth at bedtime.     spironolactone (ALDACTONE) 25 MG tablet Take 25 mg by mouth daily.     SYSTANE COMPLETE PF 0.6 % SOLN Place 1 drop into both eyes in the morning, at noon, and at bedtime.     warfarin (JANTOVEN ) 2 MG tablet TAKE 1/2  TO 1 TABLET (1 TO 2MG ) DAILY OR AS DIRECTED  BY ANTICOAGULATION CLINIC 50 tablet 1   No current facility-administered medications for this visit.    PHYSICAL EXAMINATION: Not performed   LABORATORY DATA:  I have reviewed the data as listed    Latest Ref Rng & Units 11/15/2022    3:56 AM 11/14/2022    3:49 AM 11/13/2022    2:41 PM  CBC  WBC 4.0 - 10.5 K/uL 8.7  9.9    Hemoglobin 12.0 - 15.0 g/dL 9.1  9.3  8.1   Hematocrit 36.0 - 46.0 % 29.3  29.6  25.5   Platelets 150 - 400 K/uL 177  177          Latest Ref Rng & Units 11/14/2022    3:49 AM 11/13/2022    3:32 AM 11/12/2022    3:47 AM  CMP  Glucose 70 - 99 mg/dL 865  784  696   BUN 8 - 23 mg/dL 25  32  29   Creatinine 0.44 - 1.00 mg/dL 2.95  2.84  1.32   Sodium 135 - 145 mmol/L 139  131  135   Potassium 3.5 - 5.1 mmol/L  3.7  3.9  4.1   Chloride 98 - 111 mmol/L 106  101  103   CO2 22 - 32 mmol/L 23  23  22    Calcium  8.9 - 10.3 mg/dL 8.6  7.7  7.9   Total Protein 6.5 - 8.1 g/dL   5.3   Total Bilirubin 0.3 - 1.2 mg/dL   0.4   Alkaline Phos 38 - 126 U/L   70   AST 15 - 41 U/L   19   ALT 0 - 44 U/L   14       RADIOGRAPHIC STUDIES: I have personally reviewed the radiological images as listed and agreed with the findings in the report. No results found.     I discussed the assessment and treatment plan with the patient. The patient was provided an opportunity to ask questions and all were answered. The patient agreed with the plan and demonstrated an understanding of the instructions.   The patient was advised to call back or seek an in-person evaluation if the symptoms worsen or if the condition fails to improve as anticipated.  I provided 15 minutes of non face-to-face telephone visit time during this encounter, and > 50% was spent counseling as documented under my assessment & plan.     Sonja [place], MD 05/20/23

## 2023-05-21 ENCOUNTER — Encounter: Payer: Self-pay | Admitting: *Deleted

## 2023-05-28 ENCOUNTER — Encounter: Payer: Self-pay | Admitting: Hematology

## 2023-06-02 ENCOUNTER — Ambulatory Visit (INDEPENDENT_AMBULATORY_CARE_PROVIDER_SITE_OTHER): Admitting: Internal Medicine

## 2023-06-02 DIAGNOSIS — Z7901 Long term (current) use of anticoagulants: Secondary | ICD-10-CM | POA: Diagnosis not present

## 2023-06-02 LAB — POCT INR: INR: 1.6 — AB (ref 2.0–3.0)

## 2023-06-16 ENCOUNTER — Ambulatory Visit (INDEPENDENT_AMBULATORY_CARE_PROVIDER_SITE_OTHER): Admitting: Cardiology

## 2023-06-16 DIAGNOSIS — Z7901 Long term (current) use of anticoagulants: Secondary | ICD-10-CM | POA: Diagnosis not present

## 2023-06-16 LAB — POCT INR: INR: 1.6 — AB (ref 2.0–3.0)

## 2023-06-18 ENCOUNTER — Telehealth: Payer: Self-pay | Admitting: Cardiovascular Disease

## 2023-06-18 NOTE — Telephone Encounter (Signed)
 Awaiting fax to come through

## 2023-06-18 NOTE — Telephone Encounter (Signed)
 [facility] calling regarding fax that was just sent over again for orders from Feb that needs to be signed and faxed back asap/perferably today due to timing

## 2023-06-21 NOTE — Telephone Encounter (Signed)
 Faxed orders in Gollan's box

## 2023-06-29 NOTE — Telephone Encounter (Signed)
Routing to correct triage pool

## 2023-06-29 NOTE — Telephone Encounter (Signed)
 Caller Caryl Clas) called to follow-up on getting orders for PT INR services signed.  Caller stated she has re-faxed the order along with a new order for skilled nursing INR services.

## 2023-06-29 NOTE — Telephone Encounter (Signed)
 Called and made her aware that Dr. Gollan will return on Monday and we can get that addressed at that time. She verbalized understanding with no further questions at this time.

## 2023-06-30 ENCOUNTER — Ambulatory Visit (INDEPENDENT_AMBULATORY_CARE_PROVIDER_SITE_OTHER): Admitting: Cardiology

## 2023-06-30 DIAGNOSIS — Z7901 Long term (current) use of anticoagulants: Secondary | ICD-10-CM | POA: Diagnosis not present

## 2023-06-30 LAB — POCT INR: INR: 3.9 — AB (ref 2.0–3.0)

## 2023-07-05 NOTE — Telephone Encounter (Signed)
 Order signed and faxed to [facility].

## 2023-07-14 ENCOUNTER — Telehealth: Payer: Self-pay

## 2023-07-14 ENCOUNTER — Ambulatory Visit (INDEPENDENT_AMBULATORY_CARE_PROVIDER_SITE_OTHER): Admitting: Internal Medicine

## 2023-07-14 DIAGNOSIS — Z7901 Long term (current) use of anticoagulants: Secondary | ICD-10-CM

## 2023-07-14 LAB — POCT INR: INR: 6.9 — AB (ref 2.0–3.0)

## 2023-07-14 NOTE — Telephone Encounter (Signed)
 I spoke with Lauraine from [facility] who called to inform me that her tubes for INR lab draw were expired and that she would draw lab 6/26 in the morning and have results faxed to [facility].  Patient will Hold tonight and await instructions tomorrow.

## 2023-07-15 LAB — PROTIME-INR: INR: 6.2 — AB (ref 0.80–1.20)

## 2023-07-15 NOTE — Telephone Encounter (Signed)
 Pts home health nurse calling to report pts INR: 6.2. Nurse states [facility] should be in contact with our office.

## 2023-07-15 NOTE — Progress Notes (Signed)
Please see anticoagulation encounter.

## 2023-07-15 NOTE — Telephone Encounter (Addendum)
 Per Lauraine LPN [facility] POC INR on 6/25 6.9. STAT lab drawn since POC INR >6.0. Lab was drawn on 07/14/23; however there was an issue with sample/tube. On 07/15/23 lab results from lab draw on 07/15/23 is 6.2  Called and spoke with pt and Lauraine, LPN home health nurse. Instructed to continue to HOLD Warfarin today and tomorrow and then START 1 tablet daily, except 0.5 tablet on Sundays, Mondays, Wednesdays and Fridays.  Recheck INR on Wednesday on 07/21/23

## 2023-07-15 NOTE — Patient Instructions (Signed)
 Description   - 07/14/23 [facility]oke with [facility] P[facility]R on 6/25 6.9. STAT lab drawn since POC INR >6.0. Lab was drawn on 07/14/23; however there was an issue with sample/tube.  - 07/15/23: Per note, lab results from lab draw on 07/15/23 is 6.2  Called and spoke with pt and Lauraine, LPN home health nurse. Instructed to continue to HOLD Warfarin today and tomorrow and then START 1 tablet daily, except 0.5 tablet on Sundays, Mondays, Wednesdays and Fridays.  Recheck INR on Wednesday on 07/21/23 Coumadin  Clinic [phone]

## 2023-07-21 ENCOUNTER — Ambulatory Visit (INDEPENDENT_AMBULATORY_CARE_PROVIDER_SITE_OTHER): Admitting: Internal Medicine

## 2023-07-21 DIAGNOSIS — Z7901 Long term (current) use of anticoagulants: Secondary | ICD-10-CM | POA: Diagnosis not present

## 2023-07-21 LAB — POCT INR: INR: 2.8 (ref 2.0–3.0)

## 2023-07-28 ENCOUNTER — Encounter: Payer: Self-pay | Admitting: *Deleted

## 2023-07-28 ENCOUNTER — Emergency Department (HOSPITAL_COMMUNITY)

## 2023-07-28 ENCOUNTER — Inpatient Hospital Stay: Attending: Hematology

## 2023-07-28 ENCOUNTER — Inpatient Hospital Stay (HOSPITAL_BASED_OUTPATIENT_CLINIC_OR_DEPARTMENT_OTHER): Admitting: Nurse Practitioner

## 2023-07-28 ENCOUNTER — Encounter (HOSPITAL_COMMUNITY): Payer: Self-pay | Admitting: Radiology

## 2023-07-28 ENCOUNTER — Other Ambulatory Visit

## 2023-07-28 ENCOUNTER — Other Ambulatory Visit: Payer: Self-pay

## 2023-07-28 ENCOUNTER — Ambulatory Visit: Admitting: Hematology

## 2023-07-28 ENCOUNTER — Other Ambulatory Visit: Payer: Self-pay | Admitting: Nurse Practitioner

## 2023-07-28 ENCOUNTER — Encounter: Payer: Self-pay | Admitting: Nurse Practitioner

## 2023-07-28 ENCOUNTER — Inpatient Hospital Stay (HOSPITAL_COMMUNITY)
Admission: EM | Admit: 2023-07-28 | Discharge: 2023-07-31 | DRG: 082 | Disposition: A | Discharge status: Skilled Nursing Facility | Attending: Family Medicine | Admitting: Family Medicine

## 2023-07-28 DIAGNOSIS — R569 Unspecified convulsions: Secondary | ICD-10-CM | POA: Diagnosis present

## 2023-07-28 DIAGNOSIS — C50811 Malignant neoplasm of overlapping sites of right female breast: Secondary | ICD-10-CM | POA: Diagnosis present

## 2023-07-28 DIAGNOSIS — Z880 Allergy status to penicillin: Secondary | ICD-10-CM

## 2023-07-28 DIAGNOSIS — W19XXXA Unspecified fall, initial encounter: Secondary | ICD-10-CM | POA: Diagnosis not present

## 2023-07-28 DIAGNOSIS — Z170 Estrogen receptor positive status [ER+]: Secondary | ICD-10-CM | POA: Diagnosis not present

## 2023-07-28 DIAGNOSIS — Y92002 Bathroom of unspecified non-institutional (private) residence single-family (private) house as the place of occurrence of the external cause: Secondary | ICD-10-CM

## 2023-07-28 DIAGNOSIS — I10 Essential (primary) hypertension: Secondary | ICD-10-CM | POA: Diagnosis present

## 2023-07-28 DIAGNOSIS — S0990XA Unspecified injury of head, initial encounter: Secondary | ICD-10-CM

## 2023-07-28 DIAGNOSIS — Z79811 Long term (current) use of aromatase inhibitors: Secondary | ICD-10-CM

## 2023-07-28 DIAGNOSIS — I4892 Unspecified atrial flutter: Secondary | ICD-10-CM | POA: Diagnosis present

## 2023-07-28 DIAGNOSIS — Z7901 Long term (current) use of anticoagulants: Secondary | ICD-10-CM

## 2023-07-28 DIAGNOSIS — G9341 Metabolic encephalopathy: Secondary | ICD-10-CM | POA: Diagnosis present

## 2023-07-28 DIAGNOSIS — S065XAA Traumatic subdural hemorrhage with loss of consciousness status unknown, initial encounter: Secondary | ICD-10-CM | POA: Diagnosis not present

## 2023-07-28 DIAGNOSIS — S0083XA Contusion of other part of head, initial encounter: Secondary | ICD-10-CM | POA: Diagnosis present

## 2023-07-28 DIAGNOSIS — Z8249 Family history of ischemic heart disease and other diseases of the circulatory system: Secondary | ICD-10-CM

## 2023-07-28 DIAGNOSIS — I129 Hypertensive chronic kidney disease with stage 1 through stage 4 chronic kidney disease, or unspecified chronic kidney disease: Secondary | ICD-10-CM | POA: Diagnosis present

## 2023-07-28 DIAGNOSIS — E871 Hypo-osmolality and hyponatremia: Secondary | ICD-10-CM | POA: Diagnosis present

## 2023-07-28 DIAGNOSIS — R55 Syncope and collapse: Secondary | ICD-10-CM | POA: Diagnosis not present

## 2023-07-28 DIAGNOSIS — N179 Acute kidney failure, unspecified: Secondary | ICD-10-CM | POA: Diagnosis present

## 2023-07-28 DIAGNOSIS — Z888 Allergy status to other drugs, medicaments and biological substances status: Secondary | ICD-10-CM

## 2023-07-28 DIAGNOSIS — I480 Paroxysmal atrial fibrillation: Secondary | ICD-10-CM | POA: Diagnosis present

## 2023-07-28 DIAGNOSIS — W1811XA Fall from or off toilet without subsequent striking against object, initial encounter: Secondary | ICD-10-CM | POA: Diagnosis present

## 2023-07-28 DIAGNOSIS — Z66 Do not resuscitate: Secondary | ICD-10-CM | POA: Diagnosis present

## 2023-07-28 DIAGNOSIS — D649 Anemia, unspecified: Secondary | ICD-10-CM | POA: Diagnosis present

## 2023-07-28 DIAGNOSIS — E782 Mixed hyperlipidemia: Secondary | ICD-10-CM | POA: Diagnosis present

## 2023-07-28 DIAGNOSIS — D631 Anemia in chronic kidney disease: Secondary | ICD-10-CM | POA: Diagnosis present

## 2023-07-28 DIAGNOSIS — N1832 Chronic kidney disease, stage 3b: Secondary | ICD-10-CM | POA: Diagnosis present

## 2023-07-28 DIAGNOSIS — F32A Depression, unspecified: Secondary | ICD-10-CM | POA: Diagnosis present

## 2023-07-28 DIAGNOSIS — Z79899 Other long term (current) drug therapy: Secondary | ICD-10-CM

## 2023-07-28 LAB — CBC WITH DIFFERENTIAL (CANCER CENTER ONLY)
Abs Immature Granulocytes: 0.04 K/uL (ref 0.00–0.07)
Basophils Absolute: 0 K/uL (ref 0.0–0.1)
Basophils Relative: 1 %
Eosinophils Absolute: 0.2 K/uL (ref 0.0–0.5)
Eosinophils Relative: 3 %
HCT: 29.9 % — ABNORMAL LOW (ref 36.0–46.0)
Hemoglobin: 9.6 g/dL — ABNORMAL LOW (ref 12.0–15.0)
Immature Granulocytes: 1 %
Lymphocytes Relative: 18 %
Lymphs Abs: 1.2 K/uL (ref 0.7–4.0)
MCH: 30.5 pg (ref 26.0–34.0)
MCHC: 32.1 g/dL (ref 30.0–36.0)
MCV: 94.9 fL (ref 80.0–100.0)
Monocytes Absolute: 0.7 K/uL (ref 0.1–1.0)
Monocytes Relative: 10 %
Neutro Abs: 4.7 K/uL (ref 1.7–7.7)
Neutrophils Relative %: 67 %
Platelet Count: 216 K/uL (ref 150–400)
RBC: 3.15 MIL/uL — ABNORMAL LOW (ref 3.87–5.11)
RDW: 14.2 % (ref 11.5–15.5)
WBC Count: 6.9 K/uL (ref 4.0–10.5)
nRBC: 0 % (ref 0.0–0.2)

## 2023-07-28 LAB — MRSA NEXT GEN BY PCR, NASAL: MRSA by PCR Next Gen: NOT DETECTED

## 2023-07-28 LAB — PROTIME-INR
INR: 2.8 — ABNORMAL HIGH (ref 0.8–1.2)
INR: 1.3 — ABNORMAL HIGH (ref 0.8–1.2)
Prothrombin Time: 30.7 s — ABNORMAL HIGH (ref 11.4–15.2)
Prothrombin Time: 16.5 s — ABNORMAL HIGH (ref 11.4–15.2)

## 2023-07-28 LAB — CMP (CANCER CENTER ONLY)
ALT: 19 U/L (ref 0–44)
AST: 19 U/L (ref 15–41)
Albumin: 4 g/dL (ref 3.5–5.0)
Alkaline Phosphatase: 90 U/L (ref 38–126)
Anion gap: 6 (ref 5–15)
BUN: 41 mg/dL — ABNORMAL HIGH (ref 8–23)
CO2: 26 mmol/L (ref 22–32)
Calcium: 9.3 mg/dL (ref 8.9–10.3)
Chloride: 100 mmol/L (ref 98–111)
Creatinine: 1.93 mg/dL — ABNORMAL HIGH (ref 0.44–1.00)
GFR, Estimated: 25 mL/min — ABNORMAL LOW (ref >60)
Glucose, Bld: 96 mg/dL (ref 70–99)
Potassium: 5.1 mmol/L (ref 3.5–5.1)
Sodium: 132 mmol/L — ABNORMAL LOW (ref 135–145)
Total Bilirubin: 0.4 mg/dL (ref 0.0–1.2)
Total Protein: 6.8 g/dL (ref 6.5–8.1)

## 2023-07-28 MED ORDER — AZELASTINE HCL 0.1 % NA SOLN: 2.0000 | Freq: Every day | NASAL | Status: DC | PRN

## 2023-07-28 MED ORDER — SERTRALINE HCL 50 MG PO TABS
75.0000 mg | ORAL_TABLET | Freq: Every day | ORAL | Status: DC
  Administered 2023-07-28 – 2023-07-30 (×3): 75 mg via ORAL
  Filled 2023-07-28 (×2): qty 1
  Filled 2023-07-28: qty 2

## 2023-07-28 MED ORDER — ANASTROZOLE 1 MG PO TABS
1.0000 mg | ORAL_TABLET | Freq: Every day | ORAL | Status: DC
  Administered 2023-07-28 – 2023-07-31 (×4): 1 mg via ORAL
  Filled 2023-07-28 (×4): qty 1

## 2023-07-28 MED ORDER — LEVETIRACETAM (KEPPRA) 500 MG/5 ML ADULT IV PUSH
1000.0000 mg | Freq: Once | INTRAVENOUS | Status: AC
  Administered 2023-07-28: 1000 mg via INTRAVENOUS
  Filled 2023-07-28: qty 10

## 2023-07-28 MED ORDER — ORAL CARE MOUTH RINSE: 15.0000 mL | OROMUCOSAL | Status: DC | PRN

## 2023-07-28 MED ORDER — LORAZEPAM 2 MG/ML IJ SOLN: 1.0000 mg | INTRAMUSCULAR | Status: DC | PRN

## 2023-07-28 MED ORDER — ADULT MULTIVITAMIN W/MINERALS CH
1.0000 | ORAL_TABLET | Freq: Every evening | ORAL | Status: DC
  Administered 2023-07-28 – 2023-07-30 (×3): 1 via ORAL
  Filled 2023-07-28 (×3): qty 1

## 2023-07-28 MED ORDER — VITAMIN K1 10 MG/ML IJ SOLN
10.0000 mg | INTRAVENOUS | Status: AC
  Administered 2023-07-28: 10 mg via INTRAVENOUS
  Filled 2023-07-28: qty 1

## 2023-07-28 MED ORDER — PANTOPRAZOLE SODIUM 40 MG PO TBEC
40.0000 mg | DELAYED_RELEASE_TABLET | Freq: Every day | ORAL | Status: DC
  Administered 2023-07-28 – 2023-07-31 (×4): 40 mg via ORAL
  Filled 2023-07-28 (×4): qty 1

## 2023-07-28 MED ORDER — PROTHROMBIN COMPLEX CONC HUMAN 500 UNITS IV KIT
1598.0000 [IU] | PACK | Status: AC
  Administered 2023-07-28: 1598 [IU] via INTRAVENOUS
  Filled 2023-07-28: qty 1076

## 2023-07-28 MED ORDER — ACETAMINOPHEN 650 MG RE SUPP: 650.0000 mg | Freq: Four times a day (QID) | RECTAL | Status: DC | PRN

## 2023-07-28 MED ORDER — CHLORHEXIDINE GLUCONATE CLOTH 2 % EX PADS
6.0000 | MEDICATED_PAD | Freq: Every day | CUTANEOUS | Status: DC
  Administered 2023-07-30: 6 via TOPICAL

## 2023-07-28 MED ORDER — LORAZEPAM 2 MG/ML IJ SOLN: 1.0000 mg | Freq: Once | INTRAMUSCULAR | Status: AC

## 2023-07-28 MED ORDER — POLYETHYLENE GLYCOL 3350 17 G PO PACK: 17.0000 g | PACK | ORAL | Status: DC | PRN

## 2023-07-28 MED ORDER — FERROUS SULFATE 325 (65 FE) MG PO TABS
325.0000 mg | ORAL_TABLET | Freq: Every day | ORAL | Status: DC
  Administered 2023-07-29 – 2023-07-31 (×3): 325 mg via ORAL
  Filled 2023-07-28 (×3): qty 1

## 2023-07-28 MED ORDER — SODIUM CHLORIDE 0.9 % IV SOLN: INTRAVENOUS | Status: AC

## 2023-07-28 MED ORDER — METOPROLOL SUCCINATE ER 50 MG PO TB24
25.0000 mg | ORAL_TABLET | Freq: Two times a day (BID) | ORAL | Status: DC
  Administered 2023-07-28 – 2023-07-31 (×6): 25 mg via ORAL
  Filled 2023-07-28 (×6): qty 1

## 2023-07-28 MED ORDER — SENNOSIDES-DOCUSATE SODIUM 8.6-50 MG PO TABS
1.0000 | ORAL_TABLET | Freq: Every evening | ORAL | Status: DC | PRN
Start: 2023-07-28 — End: 2023-07-31

## 2023-07-28 MED ORDER — DILTIAZEM HCL ER 60 MG PO CP12
60.0000 mg | ORAL_CAPSULE | Freq: Two times a day (BID) | ORAL | Status: DC
  Administered 2023-07-28 – 2023-07-31 (×6): 60 mg via ORAL
  Filled 2023-07-28 (×7): qty 1

## 2023-07-28 MED ORDER — ONDANSETRON HCL 4 MG/2ML IJ SOLN: 4.0000 mg | Freq: Four times a day (QID) | INTRAMUSCULAR | Status: DC | PRN

## 2023-07-28 MED ORDER — MELATONIN 5 MG PO TABS
5.0000 mg | ORAL_TABLET | Freq: Once | ORAL | Status: AC
  Administered 2023-07-28: 5 mg via ORAL
  Filled 2023-07-28: qty 1

## 2023-07-28 MED ORDER — ROSUVASTATIN CALCIUM 10 MG PO TABS: 10.0000 mg | ORAL_TABLET | ORAL | Status: DC

## 2023-07-28 MED ORDER — LORAZEPAM 2 MG/ML IJ SOLN
INTRAMUSCULAR | Status: AC
  Administered 2023-07-28: 1 mg via INTRAVENOUS
  Filled 2023-07-28: qty 1

## 2023-07-28 MED ORDER — ONDANSETRON HCL 4 MG PO TABS: 4.0000 mg | ORAL_TABLET | Freq: Four times a day (QID) | ORAL | Status: DC | PRN

## 2023-07-28 MED ORDER — ACETAMINOPHEN 325 MG PO TABS
650.0000 mg | ORAL_TABLET | Freq: Four times a day (QID) | ORAL | Status: DC | PRN
Start: 2023-07-28 — End: 2023-07-31
  Administered 2023-07-30 (×2): 650 mg via ORAL
  Filled 2023-07-28 (×2): qty 2

## 2023-07-28 MED ORDER — POLYVINYL ALCOHOL 1.4 % OP SOLN
1.0000 [drp] | Freq: Three times a day (TID) | OPHTHALMIC | Status: DC
  Administered 2023-07-28 – 2023-07-31 (×8): 1 [drp] via OPHTHALMIC
  Filled 2023-07-28: qty 15

## 2023-07-28 NOTE — ED Provider Notes (Addendum)
 [place] EMERGENCY DEPARTMENT AT [facility] Provider Note   CSN: [id number] Arrival date & time: 07/28/23  1448     Patient presents with: Brandi [name] is a 83 y.o. female who presents to the emergency department with a chief complaint of fall.  Patient reportedly fell on 07/24/2023, reporting that she passed out on the toilet in the early morning and fell on the tile floor.  Patient then reports bleeding from nose and forehead. Patient has noted to have large lump on forehead during appointment at cancer center.  Patient does take warfarin.  Past medical history significant for depression, hyperlipidemia, atrial flutter, outpatient anticoagulant use, generalized weakness, paroxysmal atrial fibrillation, chronic kidney disease stage IIIb, hypertension, frequent falls, malignant neoplasm of right breast. Denies past history of syncope, denies visual changes, neck pain, chest pain, abdominal pain, shortness of breath, extremity pain. Patient states she has been ambulatory with walker and cane since fall.     Fall       Prior to Admission medications   Medication Sig Start Date End Date Taking? Authorizing Provider  acetaminophen  (TYLENOL ) 500 MG tablet Take 1,000 mg by mouth every 6 (six) hours as needed for mild pain.    [provider]  anastrozole  (ARIMIDEX ) 1 MG tablet Take 1 tablet (1 mg total) by mouth daily. 05/12/23   Lanny Callander, MD  azelastine  (ASTELIN ) 0.1 % nasal spray Place 2 sprays into both nostrils daily as needed for rhinitis or allergies. 06/07/19   [provider]  cetirizine (ZYRTEC) 10 MG tablet Take 10 mg by mouth daily as needed for allergies.    [provider]  clotrimazole-betamethasone (LOTRISONE) cream Apply 1 Application topically as needed (rash). 01/05/23   [provider]  diltiazem  (CARDIZEM  SR) 60 MG 12 hr capsule Take 60 mg by mouth every 12 (twelve) hours.    [provider]  ferrous sulfate   325 (65 FE) MG tablet Take 325 mg by mouth daily with breakfast.    [provider]  guanFACINE  (TENEX ) 2 MG tablet Take 2 mg by mouth every evening. 09/19/21   [provider]  Lidocaine  HCl (ASPERCREME LIDOCAINE ) 4 % LIQD Apply 1 Application topically 2 (two) times daily as needed (knee pain).    [provider]  metoprolol  succinate (TOPROL -XL) 50 MG 24 hr tablet Take 25 mg by mouth in the morning and at bedtime. Take with or immediately following a meal.    [provider]  Multiple Vitamins-Minerals (CENTRUM SILVER 50+WOMEN) TABS Take 1 tablet by mouth every evening.    [provider]  pantoprazole  (PROTONIX ) 40 MG tablet Take 1 tablet (40 mg total) by mouth daily. 07/12/21   Singh, Prashant K, MD  polyethylene glycol (MIRALAX  / GLYCOLAX ) 17 g packet Take 17 g by mouth daily. Patient taking differently: Take 17 g by mouth as needed for moderate constipation. 11/17/22   Raenelle Coria, MD  rosuvastatin  (CRESTOR ) 10 MG tablet Take 10 mg by mouth See admin instructions. Take 10 mg by mouth at bedtime on Sun/Wed only (twice a week) 10/10/19   [provider]  senna-docusate (SENOKOT-S) 8.6-50 MG tablet Take 1 tablet by mouth at bedtime as needed for mild constipation. 08/30/22   Lue Elsie BROCKS, MD  sertraline  (ZOLOFT ) 50 MG tablet Take 75 mg by mouth at bedtime.    [provider]  spironolactone  (ALDACTONE ) 25 MG tablet Take 25 mg by mouth daily. 04/15/23   [provider]  SYSTANE COMPLETE PF 0.6 % SOLN Place 1 drop into both eyes in the morning, at noon, and at bedtime.    [provider]  warfarin (JANTOVEN ) 2 MG tablet TAKE 1/2 TO 1 TABLET (1 TO 2MG ) DAILY OR AS DIRECTED  BY ANTICOAGULATION CLINIC 03/26/23   Gollan, Timothy J, MD    Allergies: Amoxil [amoxicillin] and Celebrex [celecoxib]    Review of Systems  Skin:  Positive for color change (Significant bruising to face).    Updated Vital Signs BP (!)  154/82   Pulse 93   Temp 98 F (36.7 C) (Axillary)   Resp 19   Ht 4' 11 (1.499 m)   Wt 53.5 kg   SpO2 100%   BMI 23.83 kg/m   Physical Exam Vitals and nursing note reviewed.  Constitutional:      General: She is awake. She is not in acute distress.    Appearance: She is not toxic-appearing or diaphoretic.  HENT:     Head:     Comments: Significant bruising present to forehead, as well as face. Color changing consistent with bruising and yellow bilirubin. Lumped area of swelling present to forehead. Entire forehead and face tender to palpation, no scalp tenderness. Racoon eyes present, no battle's sign.  Eyes:     General: No visual field deficit.    Extraocular Movements: Extraocular movements intact.     Pupils: Pupils are equal, round, and reactive to light.     Comments: Right eye has popped blood vessels in conjunctiva, more yellow than left eye   Cardiovascular:     Rate and Rhythm: Rhythm irregular.  Pulmonary:     Effort: Pulmonary effort is normal. No respiratory distress.     Breath sounds: Normal breath sounds. No wheezing, rhonchi or rales.  Abdominal:     General: Abdomen is flat. There is no distension.     Palpations: Abdomen is soft.     Tenderness: There is no abdominal tenderness. There is no guarding.  Musculoskeletal:        General: Normal range of motion.     Cervical back: Normal range of motion. No rigidity or tenderness.     Right lower leg: No edema.     Left lower leg: No edema.     Comments: Bilateral upper and lower extremities non tender to palpation other than mild tenderness with palpation of left hip. Negative log roll test of BL LE.   Active ROM of Bilateral upper extremities as expected. Dorsiflexion and Plantarflexion of bilateral lower extremities intact.   Skin:    Capillary Refill: Capillary refill takes less than 2 seconds.     Findings: Bruising (Significant bruising present to face) present.  Neurological:     General: No focal  deficit present.     Mental Status: She is alert and oriented to person, place, and time.     GCS: GCS eye subscore is 4. GCS verbal subscore is 5. GCS motor subscore is 6.     Cranial Nerves: No cranial nerve deficit or dysarthria.     Sensory: Sensation is intact. No sensory deficit.     Motor: Motor function is intact. No weakness.     Coordination: Coordination is intact. Coordination normal.  Psychiatric:        Mood and Affect: Mood normal.        Behavior: Behavior normal. Behavior is cooperative.     (all labs ordered are listed, but only abnormal results are displayed) Labs Reviewed  PROTIME-INR - Abnormal; Notable for the following components:      Result Value   Prothrombin  Time 30.7 (*)    INR 2.8 (*)    All other components within normal limits  PROTIME-INR - Abnormal; Notable for the following components:   Prothrombin  Time 16.5 (*)    INR 1.3 (*)    All other components within normal limits  MRSA NEXT GEN BY PCR, NASAL  URINALYSIS, ROUTINE W REFLEX MICROSCOPIC  PROTIME-INR  TSH  MAGNESIUM   PHOSPHORUS  CBC  COMPREHENSIVE METABOLIC PANEL WITH GFR    EKG: EKG Interpretation Date/Time:  Wednesday July 28 2023 16:03:08 EDT Ventricular Rate:  103 PR Interval:    QRS Duration:  80 QT Interval:  349 QTC Calculation: 457 R Axis:   -3  Text Interpretation: Atrial flutter with predominant 3:1 AV block Probable anterior infarct, old Confirmed by Patsey Lot [phone]) on 07/28/2023 11:01:40 PM  Radiology: CT HEAD WO CONTRAST ( ) Result Date: 07/28/2023 CLINICAL DATA:  Subdural hematoma EXAM: CT HEAD WITHOUT CONTRAST TECHNIQUE: Contiguous axial images were obtained from the base of the skull through the vertex without intravenous contrast. RADIATION DOSE REDUCTION: This exam was performed according to the departmental dose-optimization program which includes automated exposure control, adjustment of the mA and/or kV according to patient size and/or use of iterative  reconstruction technique. COMPARISON:  CT of the head dated July 28, 2023. FINDINGS: Brain: The patient's right parafalcine and supratentorial subdural hematoma has remained stable in the interim, measuring up to approximately 4 mm in thickness, as before. There is no significant mass effect upon the brain parenchyma. There is moderate generalized cerebral volume loss and moderate periventricular white matter disease. Vascular: Mild calcific atheromatous disease. Skull: Intact and unremarkable. There is soft tissue swelling of the forehead to the left of midline. Sinuses/Orbits: Complete opacification of the right maxillary sinus and moderate opacification of the right ethmoid air cells. Status post bilateral lens replacement. Interval development of multiple small bubbles of air within the right orbit, periorbital soft tissues and cavernous sinuses, likely iatrogenic. No facial fractures evident. Other: Interval development of multiple bubbles of air within the right orbit, cavernous sinus and soft tissues. IMPRESSION: 1. Stable right parafalcine and supratentorial subdural hematoma. 2. Age-related atrophy and mild to moderate cerebral white matter disease. 3. Numerous bubbles of air are now present, likely iatrogenic. Electronically Signed   By: Evalene Coho M.D.   On: 07/28/2023 20:20   DG Chest 1 View Result Date: 07/28/2023 CLINICAL DATA:  Fall. EXAM: CHEST  1 VIEW COMPARISON:  November 10, 2022. FINDINGS: Mild cardiomegaly. Both lungs are clear. The visualized skeletal structures are unremarkable. IMPRESSION: No active disease. Electronically Signed   By: Lynwood Landy Raddle M.D.   On: 07/28/2023 17:33   DG Pelvis 1-2 Views Result Date: 07/28/2023 CLINICAL DATA:  Fall. EXAM: PELVIS - 1-2 VIEW COMPARISON:  August 25, 2022. FINDINGS: Status post surgical internal fixation of old proximal left femoral fracture. Old right inferior pubic ramus fracture is noted. No acute fracture or dislocation is noted.  IMPRESSION: No acute abnormality seen. Electronically Signed   By: Lynwood Landy Raddle M.D.   On: 07/28/2023 17:32   CT Head Wo Contrast Result Date: 07/28/2023 CLINICAL DATA:  Head trauma, moderate-severe. Fall, racoon eyes present, on warfarin; Facial trauma, blunt EXAM: CT HEAD WITHOUT CONTRAST CT MAXILLOFACIAL WITHOUT CONTRAST CT CERVICAL SPINE WITHOUT CONTRAST TECHNIQUE: Multidetector CT imaging of the head, cervical spine, and maxillofacial structures were performed using the standard protocol  without intravenous contrast. Multiplanar CT image reconstructions of the cervical spine and maxillofacial structures were also generated. RADIATION DOSE REDUCTION: This exam was performed according to the departmental dose-optimization program which includes automated exposure control, adjustment of the mA and/or kV according to patient size and/or use of iterative reconstruction technique. COMPARISON:  11/10/2022 FINDINGS: CT HEAD FINDINGS Brain: Acute right posterior parafalcine subdural hematoma is seen layering along the right tentorium extending into the right middle cranial fossa, measuring up to 4-5 mm in thickness (15/2, 23/6). No subarachnoid, intraparenchymal, or intraventricular hemorrhage identified. No abnormal mass effect or midline shift. Moderate parenchymal volume loss is commensurate with the patient's age. Mild periventricular white matter changes are present likely reflecting the sequela of small vessel ischemia. Ventricular size is normal. Cerebellum is unremarkable. Vascular: No hyperdense vessel or unexpected calcification. Skull: Normal. Negative for fracture or focal lesion. Other: Mastoid air cells and middle ear cavities are clear. Small frontal scalp hematoma. CT MAXILLOFACIAL FINDINGS Osseous: No fracture or mandibular dislocation. No destructive process. Orbits: Negative. No traumatic or inflammatory finding. Ocular lenses of been removed. Sinuses: There is again noted dense opacification of  the right maxillary sinus, partially visualized on head CT 11/10/2022, with mineralization of the hyperdense intraluminal contents and slight thickening of the sinus walls in keeping with changes of chronic sinusitis. Several opacified anterior right ethmoid air cells again identified. Remaining paranasal sinuses are clear Soft tissues: Negative. CT CERVICAL SPINE FINDINGS Alignment: Stable 4 mm anterolisthesis C3-4, 2 mm anterolisthesis C4-5, and 2 mm retrolisthesis C5-6. No acute traumatic listhesis identified. Skull base and vertebrae: Right cervical alignment is normal. The atlantodental interval is not widened. No acute fracture of the cervical spine. Ankylosis of the right C2-3 facet joint noted. Soft tissues and spinal canal: No prevertebral fluid or swelling. No visible canal hematoma. 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 Multilevel uncovertebral and facet arthrosis results in multilevel moderate to severe neuroforaminal narrowing, most severe on the right at C3-4 and bilaterally at C5-C7. Moderate central canal stenosis secondary to posterior disc osteophyte complex at C5-6 and to a lesser extent C6-7 with flattening of the thecal sac and in a AP diameter of the spinal canal approximately 6 mm. Prevertebral soft tissues are not thickened on sagittal reformats. Upper chest: Negative. Other: None Traumatic Brain Injury Risk Stratification Skull Fracture: No - Low/mBIG 1 Subdural Hematoma (SDH): 4mm to <52mm - mBIG 2 Subarachnoid Hemorrhage [facility]): No Epidural Hematoma (EDH): No - Low/mBIG 1 Cerebral contusion, intra-axial, intraparenchymal Hemorrhage (IPH): No Intraventricular Hemorrhage (IVH): No - Low/mBIG 1 Midline Shift > 1mm or Edema/effacement of sulci/vents: No - Low/mBIG 1 ---------------------------------------------------- IMPRESSION: 1. Imaging  criteria compatible with mBIG grade 2 traumatic brain injury, as outlined above. Acute right posterior parafalcine subdural hematoma extending along the right tentorium extending into the right middle cranial fossa, measuring up to 4-5 mm in thickness. No abnormal mass effect or midline shift. 2. Small frontal scalp hematoma without underlying skull fracture. 3. No acute facial bone fracture. 4. No acute intracranial abnormality. 5. No acute fracture or traumatic malalignment of the cervical spine. 6. 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 7. Chronic right maxillary sinusitis. These results were called by telephone at the time of interpretation on 07/28/2023 at  5:12 pm to provider Heyden Jaber , who verbally acknowledged these results. Electronically Signed   By: Dorethia Molt M.D.   On: 07/28/2023 17:12   CT Cervical Spine Wo Contrast Result Date: 07/28/2023 CLINICAL DATA:  Head trauma, moderate-severe. Fall, racoon eyes present, on warfarin; Facial trauma, blunt EXAM: CT HEAD WITHOUT CONTRAST CT MAXILLOFACIAL WITHOUT CONTRAST CT CERVICAL SPINE WITHOUT CONTRAST TECHNIQUE: Multidetector CT imaging of the head, cervical spine, and maxillofacial structures were performed using the standard protocol without intravenous contrast. Multiplanar CT image reconstructions of the cervical spine and maxillofacial structures were also generated. RADIATION DOSE REDUCTION: This exam was performed according to the departmental dose-optimization program which includes automated exposure control, adjustment of the mA and/or kV according to patient size and/or use of iterative reconstruction technique. COMPARISON:  11/10/2022 FINDINGS: CT HEAD FINDINGS Brain: Acute right posterior parafalcine subdural hematoma is seen layering along the right tentorium extending into the  right middle cranial fossa, measuring up to 4-5 mm in thickness (15/2, 23/6). No subarachnoid, intraparenchymal, or intraventricular hemorrhage identified. No abnormal mass effect or midline shift. Moderate parenchymal volume loss is commensurate with the patient's age. Mild periventricular white matter changes are present likely reflecting the sequela of small vessel ischemia. Ventricular size is normal. Cerebellum is unremarkable. Vascular: No hyperdense vessel or unexpected calcification. Skull: Normal. Negative for fracture or focal lesion. Other: Mastoid air cells and middle ear cavities are clear. Small frontal scalp hematoma. CT MAXILLOFACIAL FINDINGS Osseous: No fracture or mandibular dislocation. No destructive process. Orbits: Negative. No traumatic or inflammatory finding. Ocular lenses of been removed. Sinuses: There is again noted dense opacification of the right maxillary sinus, partially visualized on head CT 11/10/2022, with mineralization of the hyperdense intraluminal contents and slight thickening of the sinus walls in keeping with changes of chronic sinusitis. Several opacified anterior right ethmoid air cells again identified. Remaining paranasal sinuses are clear Soft tissues: Negative. CT CERVICAL SPINE FINDINGS Alignment: Stable 4 mm anterolisthesis C3-4, 2 mm anterolisthesis C4-5, and 2 mm retrolisthesis C5-6. No acute traumatic listhesis identified. Skull base and vertebrae: Right cervical alignment is normal. The atlantodental interval is not widened. No acute fracture of the cervical spine. Ankylosis of the right C2-3 facet joint noted. Soft tissues and spinal canal: No prevertebral fluid or swelling. No visible canal hematoma. 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 Multilevel uncovertebral and facet arthrosis results in multilevel  moderate to severe neuroforaminal narrowing, most severe on the right at C3-4 and bilaterally at C5-C7. Moderate central canal stenosis secondary to posterior disc osteophyte complex at C5-6 and to a lesser extent C6-7 with flattening of the thecal sac and in a AP diameter of the spinal canal approximately 6 mm. Prevertebral soft tissues are not thickened on sagittal reformats. Upper chest: Negative. Other: None Traumatic Brain Injury Risk Stratification Skull Fracture: No - Low/mBIG 1 Subdural Hematoma (SDH): 4mm to <56mm - mBIG 2 Subarachnoid Hemorrhage [facility]): No Epidural Hematoma (EDH): No - Low/mBIG 1 Cerebral contusion, intra-axial, intraparenchymal Hemorrhage (IPH): No Intraventricular Hemorrhage (IVH): No - Low/mBIG 1 Midline Shift > 1mm or Edema/effacement of sulci/vents: No - Low/mBIG 1 ---------------------------------------------------- IMPRESSION: 1. Imaging criteria compatible with mBIG grade 2 traumatic brain injury, as outlined above. Acute right posterior parafalcine subdural hematoma extending along the right tentorium extending into the right middle cranial fossa, measuring up to 4-5 mm in thickness. No abnormal mass  effect or midline shift. 2. Small frontal scalp hematoma without underlying skull fracture. 3. No acute facial bone fracture. 4. No acute intracranial abnormality. 5. No acute fracture or traumatic malalignment of the cervical spine. 6. 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 7. Chronic right maxillary sinusitis. These results were called by telephone at the time of interpretation on 07/28/2023 at 5:12 pm to provider Chyla Schlender , who verbally acknowledged these results. Electronically Signed   By: Dorethia Molt M.D.   On: 07/28/2023 17:12   CT Maxillofacial Wo Contrast Result Date: 07/28/2023 CLINICAL DATA:  Head trauma,  moderate-severe. Fall, racoon eyes present, on warfarin; Facial trauma, blunt EXAM: CT HEAD WITHOUT CONTRAST CT MAXILLOFACIAL WITHOUT CONTRAST CT CERVICAL SPINE WITHOUT CONTRAST TECHNIQUE: Multidetector CT imaging of the head, cervical spine, and maxillofacial structures were performed using the standard protocol without intravenous contrast. Multiplanar CT image reconstructions of the cervical spine and maxillofacial structures were also generated. RADIATION DOSE REDUCTION: This exam was performed according to the departmental dose-optimization program which includes automated exposure control, adjustment of the mA and/or kV according to patient size and/or use of iterative reconstruction technique. COMPARISON:  11/10/2022 FINDINGS: CT HEAD FINDINGS Brain: Acute right posterior parafalcine subdural hematoma is seen layering along the right tentorium extending into the right middle cranial fossa, measuring up to 4-5 mm in thickness (15/2, 23/6). No subarachnoid, intraparenchymal, or intraventricular hemorrhage identified. No abnormal mass effect or midline shift. Moderate parenchymal volume loss is commensurate with the patient's age. Mild periventricular white matter changes are present likely reflecting the sequela of small vessel ischemia. Ventricular size is normal. Cerebellum is unremarkable. Vascular: No hyperdense vessel or unexpected calcification. Skull: Normal. Negative for fracture or focal lesion. Other: Mastoid air cells and middle ear cavities are clear. Small frontal scalp hematoma. CT MAXILLOFACIAL FINDINGS Osseous: No fracture or mandibular dislocation. No destructive process. Orbits: Negative. No traumatic or inflammatory finding. Ocular lenses of been removed. Sinuses: There is again noted dense opacification of the right maxillary sinus, partially visualized on head CT 11/10/2022, with mineralization of the hyperdense intraluminal contents and slight thickening of the sinus walls in keeping with  changes of chronic sinusitis. Several opacified anterior right ethmoid air cells again identified. Remaining paranasal sinuses are clear Soft tissues: Negative. CT CERVICAL SPINE FINDINGS Alignment: Stable 4 mm anterolisthesis C3-4, 2 mm anterolisthesis C4-5, and 2 mm retrolisthesis C5-6. No acute traumatic listhesis identified. Skull base and vertebrae: Right cervical alignment is normal. The atlantodental interval is not widened. No acute fracture of the cervical spine. Ankylosis of the right C2-3 facet joint noted. Soft tissues and spinal canal: No prevertebral fluid or swelling. No visible canal hematoma. 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 Multilevel uncovertebral and facet arthrosis results in multilevel moderate to severe neuroforaminal narrowing, most severe on the right at C3-4 and bilaterally at C5-C7. Moderate central canal stenosis secondary to posterior disc osteophyte complex at C5-6 and to a lesser extent C6-7 with flattening of the thecal sac and in a AP diameter of the spinal canal approximately 6 mm. Prevertebral soft tissues are not thickened on sagittal reformats. Upper chest: Negative. Other: None Traumatic Brain Injury Risk Stratification Skull Fracture: No - Low/mBIG 1 Subdural Hematoma (SDH): 4mm to <67mm - mBIG  2 Subarachnoid Hemorrhage [facility]): No Epidural Hematoma (EDH): No - Low/mBIG 1 Cerebral contusion, intra-axial, intraparenchymal Hemorrhage (IPH): No Intraventricular Hemorrhage (IVH): No - Low/mBIG 1 Midline Shift > 1mm or Edema/effacement of sulci/vents: No - Low/mBIG 1 ---------------------------------------------------- IMPRESSION: 1. Imaging criteria compatible with mBIG grade 2 traumatic brain injury, as outlined above. Acute right posterior parafalcine subdural hematoma extending along the right tentorium extending into the  right middle cranial fossa, measuring up to 4-5 mm in thickness. No abnormal mass effect or midline shift. 2. Small frontal scalp hematoma without underlying skull fracture. 3. No acute facial bone fracture. 4. No acute intracranial abnormality. 5. No acute fracture or traumatic malalignment of the cervical spine. 6. 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 7. Chronic right maxillary sinusitis. These results were called by telephone at the time of interpretation on 07/28/2023 at 5:12 pm to provider Madaleine Simmon , who verbally acknowledged these results. Electronically Signed   By: Dorethia Molt M.D.   On: 07/28/2023 17:12     Procedures   Medications Ordered in the ED  acetaminophen  (TYLENOL ) tablet 650 mg (has no administration in time range)    Or  acetaminophen  (TYLENOL ) suppository 650 mg (has no administration in time range)  senna-docusate (Senokot-S) tablet 1 tablet (has no administration in time range)  ondansetron  (ZOFRAN ) tablet 4 mg (has no administration in time range)    Or  ondansetron  (ZOFRAN ) injection 4 mg (has no administration in time range)  LORazepam  (ATIVAN ) injection 1 mg (has no administration in time range)  0.9 %  sodium chloride  infusion (has no administration in time range)  diltiazem  (CARDIZEM  SR) 12 hr capsule 60 mg (60 mg Oral Given 07/28/23 2202)  metoprolol  succinate (TOPROL -XL) 24 hr tablet 25 mg (25 mg Oral Given 07/28/23 2201)  artificial tears ophthalmic solution 1 drop (1 drop Both Eyes Given 07/28/23 2205)  sertraline  (ZOLOFT ) tablet 75 mg (75 mg Oral Given 07/28/23 2201)  pantoprazole  (PROTONIX ) EC tablet 40 mg (40 mg Oral Given 07/28/23 2200)  multivitamin with minerals tablet 1 tablet (1 tablet Oral Given 07/28/23 2201)  anastrozole  (ARIMIDEX ) tablet 1 mg (1 mg Oral Given 07/28/23 2201)  azelastine   (ASTELIN ) 0.1 % nasal spray 2 spray (has no administration in time range)  ferrous sulfate  tablet 325 mg (has no administration in time range)  polyethylene glycol (MIRALAX  / GLYCOLAX ) packet 17 g (has no administration in time range)  rosuvastatin  (CRESTOR ) tablet 10 mg (has no administration in time range)  Chlorhexidine  Gluconate Cloth 2 % PADS 6 each (has no administration in time range)  Oral care mouth rinse (has no administration in time range)  prothrombin  complex conc human (KCENTRA ) IVPB 1,598 Units (0 Units Intravenous Stopped 07/28/23 2101)  phytonadione  (VITAMIN K ) 10 mg in dextrose  5 % 50 mL IVPB (0 mg Intravenous Stopped 07/28/23 1917)  LORazepam  (ATIVAN ) injection 1 mg (1 mg Intravenous Given 07/28/23 1930)  levETIRAcetam  (KEPPRA ) undiluted injection 1,000 mg (1,000 mg Intravenous Given 07/28/23 1945)  melatonin tablet 5 mg (5 mg Oral Given 07/28/23 2310)    Clinical Course as of 07/28/23 2325  Wed Jul 28, 2023  1813 Spoke with neurosurgery, Dr. Mavis, no need for surgical intervention at this time, reverse and stop coumadin , consult hospitalist for admission, if they want neurosurgery to see patient in person need to be transferred to [facility] [[facility]]    Clinical Course  User Index [[facility]] Dominigue Gellner, Terrall FALCON, PA-C                                 Medical Decision Making Amount and/or Complexity of Data Reviewed Labs: ordered. Radiology: ordered.  Risk Decision regarding hospitalization.   Patient presents to the ED for concern of fall on warfarin, this involves an extensive number of treatment options, and is a complaint that carries with it a high risk of complications and morbidity.  The differential diagnosis includes brain bleed, skull fracture, facial fracture, cervical spine fracture, extremity fracture, etc.   Co morbidities that complicate the patient evaluation  Age, depression, hyperlipidemia, history of AKI, paroxysmal atrial fibrillation on warfarin, chronic kidney  disease, hypertension, frequent falls, malignant neoplasm of right breast   Lab Tests:  I Ordered, and personally interpreted labs.  The pertinent results include: Prothrombin  time 30.7, INR 2.8   Imaging Studies ordered:  I ordered imaging studies including chest x-ray, pelvic x-ray, CT head, CT cervical spine, CT maxillofacial I independently visualized and interpreted imaging which showed no acute fractures, however 4 to 5 mm right subdural bleed without mass effect or midline shift I agree with the radiologist interpretation   Cardiac Monitoring:  The patient was maintained on a cardiac monitor.  I personally viewed and interpreted the cardiac monitored which showed an underlying rhythm of: Atrial flutter   Medicines ordered and prescription drug management:  I ordered medication including vitamin K , Kcentra  for warfarin reversal Reevaluation of the patient after these medicines showed that the patient stayed the same I have reviewed the patients home medicines and have made adjustments as needed   Test Considered:  CBC, BMP/CMP: Declined at this time as patient was previously seen at cancer center earlier today, lab work was completed there   Critical Interventions:  Reversal of coagulation in setting of brain bleed   Consultations Obtained:  I requested consultation with the neurosurgery,  and discussed lab and imaging findings as well as pertinent plan - they recommend: Nothing surgical at this time, reverse and stop coumadin , if hospitalist wants patient to be seen by neurosurgery [facility]  I requested consultation with the ICU, and discussed lab and imaging findings as well as pertinent plan-they recommend: Admission to medicine, possibly stepdown, do not think critical care is needed at this time I requested consultation with the hospitalist, and discussed lab and imaging findings as well as pertinent plan-they recommend: Admission for ongoing diagnosis  and treatment    Problem List / ED Course:  83 year old female, fall at home non-mechanical, patient states that she fell off of the toilet, believes she may have passed out, no history of previous syncope documented, on warfarin for paroxysmal atrial fibrillation Significant bruising present to face on physical exam, raccoon eyes present, patient denies visual changes, vision grossly intact, pupils equal round and reactive to light, EOMs intact, face and forehead tender to palpation, no scalp tenderness, no cervical spine tenderness, range of motion of neck intact without pain All extremities nontender to palpation other than slight tenderness to palpation of left hip, there is a documented previous fracture of this hip Denies chest pain, shortness of breath, abdominal pain CT head, CT maxillofacial, CT cervical spine, chest x-ray, pelvic x-ray ordered, EKG ordered, CBC, BMP, UA Radiologist called with critical result for CT head, patient has a right subdural hematoma approximately 4 to 5 mm thickness, no mass  effect, no midline shift, no associated fractures on imaging Consulted neurosurgery who said nothing surgical to do at this time, if hospitalist would like for patient to be seen by neurosurgery in person patient needs to be sent to [facility] ICU who states they believe medicine possible stepdown is appropriate for this patient Consulted medicine hospitalist, will see patient for admission, will plan to send patient to [facility] that way neurosurgery can round in person  After discussion with attending Dr. Patsey decision was made to reverse coagulation of warfarin for this patient, patient denies multiple times history of stroke, blood clots, mechanical heart valve.  At this time we believe benefit of reversing anticoagulation outweighs risk of clot. Pharmacy consulted for vitamin K  and reversal dosing, reversal agents ordered  Patient admitted for ongoing diagnosis and treatment on Dr.  Thelda service  Most likely diagnosis at time of admission is right subdural hematoma due to syncope at home.  Reason for syncope at this time is unknown, labs at the cancer center earlier today without acute electrolyte abnormality or severe anemia, patient denies history of orthostatic hypotension, EKG completed today significant for atrial flutter however this is known in patient's history and documented, patient on warfarin for this reason. After consultation with hospitalist and patient admission orders placed, patient had a seizure in the emergency department.  Attending Dr. Patsey notified, patient given Ativan  and loading dose of Keppra , repeat head CT completed with no obvious abnormality compared to previous.  Hospitalist notified and patient status of care changed. Vitals stayed stable post-seizure, no need for intubation or other interventions at this time.    Reevaluation:  After the interventions noted above, I reevaluated the patient and found that they have :stayed the same   Social Determinants of Health:  none   Dispostion:  After consideration of the diagnostic results and the patients response to treatment, I feel that the patient would benefit from admission to the hospital for ongoing diagnosis and treatment.    Final diagnoses:  Fall, initial encounter  Injury of head, initial encounter  Subdural hematoma [facility])    ED Discharge Orders     None          Janetta Terrall FALCON, PA-C 07/28/23 1846    Jelan Batterton F, PA-C 07/28/23 1906    Patsey Lot, MD 07/28/23 2302    Patsey Lot, MD 07/28/23 2305    Janetta Terrall FALCON, PA-C 07/28/23 2325    Patsey Lot, MD 08/04/23 [phone]

## 2023-07-28 NOTE — ED Triage Notes (Signed)
 Patient at appointment at cancer center. Brought over to ED with concern from fall; patient fell on 07/22/2023. Patient states she fell on her face on 07/22/2023, denies loss of consciousness but reports bleeding from nose and forehead. Patient has large lump on forehead. Patient does take Warfarin.

## 2023-07-28 NOTE — Assessment & Plan Note (Addendum)
 invasive ductal carcinoma of breast, mcT4N1M0 stage IIIB.  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 Tumor size 3.3 cm at 9 o'clock position and 0.6 cm at 11 o'clock position with suspected lymph node involvement. Stage IIIB. Chemotherapy is not recommended due to her age and health status. Anti-estrogen therapy is advised due to strong ER and PR positivity.  -Surgery is deferred initially due to concerns about tolerance.  - Her CT scan from August 2024 was negative for metastasis.  A bone scan 05/14/2023 was also negative -she started anastrozole  in late April 2025, tolerating well.  - Plan to continue with anastrozole  every day. - Will get new imaging of the right breast for evaluation of treatment response.  Expect imaging in approximately 4 weeks. - Plan to repeat labs and follow-up with patient in 6 to 8 weeks.

## 2023-07-28 NOTE — Progress Notes (Signed)
 Patient Care Team: Addie Camellia CROME, MD as PCP - General (Internal Medicine) Perla Evalene PARAS, MD as PCP - Cardiology (Cardiology) Tyree Nanetta SAILOR, RN as Oncology Nurse Navigator Glean, Stephane BROCKS, RN (Inactive) as Oncology Nurse Navigator Aron Shoulders, MD as Consulting Physician (General Surgery) Lanny Callander, MD as Consulting Physician (Hematology) Shannon Agent, MD as Consulting Physician (Radiation Oncology)  Clinic Day:  07/28/2023  Referring physician: Caleen Dirks, MD  ASSESSMENT & PLAN:   Assessment & Plan: Malignant neoplasm of overlapping sites of right breast in female, 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 Tumor size 3.3 cm at 9 o'clock position and 0.6 cm at 11 o'clock position with suspected lymph node involvement. Stage IIIB. Chemotherapy is not recommended due to her age and health status. Anti-estrogen therapy is advised due to strong ER and PR positivity.  -Surgery is deferred initially due to concerns about tolerance.  - Her CT scan from August 2024 was negative for metastasis.  A bone scan 05/14/2023 was also negative -she started anastrozole  in late April 2025, tolerating well.  - Plan to continue with anastrozole  every day. - Will get new imaging of the right breast for evaluation of treatment response.  Expect imaging in approximately 4 weeks. - Plan to repeat labs and follow-up with patient in 6 to 8 weeks.   Fall at home Patient states that she fell in the bathroom on 07/24/2023.  States she fell off of the toilet.  Thinks she got dizzy just prior to the fall.  She hit her head on the wall of the bathtub.  States she laid on the bathroom floor for a few hours.  She had difficulty waking up other people in her house.  She did suffer nosebleed.  She was able to get it to stop on her own.  She has significant  bruising on her forehead, on her eyes, all around the right cheek and jaw, and over the bridge of her nose.  She is tender with palpation of the facial bones.  She has a large lump on the forehead, just left of midline.  This was covered with 5 cm area of scabbing.  This is also tender to palpate.  She does not believe she lost consciousness after the fall.  She did not seek emergency care.  She is on warfarin. Based on multiple medical comorbidities and taking warfarin due to atrial fibrillation, recommend she be seen in the ED for evaluation.  She was agreeable and was taken to the ED today.  There was no other bruising noted on her chest or abdomen or either arm.  Anemia Mild, stable, slightly improved anemia from previous test.  Hgb 9.6 and HCT 29.9.  Will continue to monitor these levels closely.   Plan Reviewed labs. -Stable anemia. -Slight worsening of kidney functions. Advised patient to be seen in ED due to fall at home resulting in significant patient bruising.  Concern for nasal fracture.  Currently on warfarin.  She is agreeable and was escorted to the ED. Tolerating anastrozole  well.  Continue daily. Will get new imaging of the right breast (diagnostic mammogram and ultrasound) in approximately 4 weeks.  Orders will be sent to [facility]. Labs and follow-up in 6 to 8 weeks to review new imaging.  The patient understands the plans discussed today and is in agreement with them.  She  knows to contact our office if she develops concerns prior to her next appointment.  I provided 30 minutes of face-to-face time during this encounter and > 50% was spent counseling as documented under my assessment and plan.    Powell FORBES Lessen, NP  [facility] - A DEPT OF [facility] [address] [place] [address] Dept: [phone] Dept Fax: [phone]   Orders Placed This Encounter  Procedures   MM DIAG BREAST TOMO  UNI RIGHT    Standing Status:   Future    Expected Date:   08/28/2023    Expiration Date:   07/27/2024    Reason for Exam (SYMPTOM  OR DIAGNOSIS REQUIRED):   known malignancy of right breast. on anastrozole . reassess size    Preferred imaging location?:   External             patient goes to [facility]   MS US  BREAST LTD UNI RIGHT INC AXILLA    Patient goes to [facility]    Standing Status:   Future    Expected Date:   08/28/2023    Expiration Date:   07/27/2024    Reason for Exam (SYMPTOM  OR DIAGNOSIS REQUIRED):   known right breast malignancy. reassess size    Preferred imaging location?:   External      CHIEF COMPLAINT:  CC: Right breast cancer, estrogen receptor positive  Current Treatment: Anastrozole  daily  INTERVAL HISTORY:  Brandi [name] is here today for repeat clinical assessment. She was last seen by Dr. Lanny on 05/20/2023.  Today, she reports a fall at home on 07/24/2023.  She believes she got dizzy while sitting on the toilet.  She fell forward onto her knees and head.  She had a nosebleed that took a long time to stop.  She reports laying on the floor for a couple of hours before she was able to wake up any members in the home.  She did not seek emergency care.  She felt like she was fine.  She reports having a headache.  Reports no loss of consciousness related to the fall or hitting her head.  She feels like she may have broken her nose as she felt it move up into her head when she hit the floor.  She has considerable  bruising on her face.  She denies visual changes since the fall.  Denies increased weakness.  She continues to take anastrozole  daily.  She reports no hot flashes or unusual joint pain.  She denies chest pain, chest pressure, shortness of breath, or palpitations.  She denies nausea, vomiting, diarrhea, constipation, or abdominal pain.  She denies fevers or chills. She denies pain. Her appetite is good. Her weight has been stable.  I have reviewed the past medical  history, past surgical history, social history and family history with the patient and they are unchanged from previous note.  ALLERGIES:  is allergic to amoxil [amoxicillin] and celebrex [celecoxib].  MEDICATIONS:  Current Outpatient Medications  Medication Sig Dispense Refill   acetaminophen  (TYLENOL ) 500 MG tablet Take 1,000 mg by mouth every 6 (six) hours as needed for mild pain.     anastrozole  (ARIMIDEX ) 1 MG tablet Take 1 tablet (1 mg total) by mouth daily. 30 tablet 3   azelastine  (ASTELIN ) 0.1 % nasal spray Place 2 sprays into both nostrils daily as needed for rhinitis or allergies.     cetirizine (ZYRTEC) 10 MG tablet  Take 10 mg by mouth daily as needed for allergies.     clotrimazole-betamethasone (LOTRISONE) cream Apply 1 Application topically as needed (rash).     diltiazem  (CARDIZEM  SR) 60 MG 12 hr capsule Take 60 mg by mouth every 12 (twelve) hours.     ferrous sulfate  325 (65 FE) MG tablet Take 325 mg by mouth daily with breakfast.     guanFACINE  (TENEX ) 2 MG tablet Take 2 mg by mouth every evening.     Lidocaine  HCl (ASPERCREME LIDOCAINE ) 4 % LIQD Apply 1 Application topically 2 (two) times daily as needed (knee pain).     metoprolol  succinate (TOPROL -XL) 50 MG 24 hr tablet Take 25 mg by mouth in the morning and at bedtime. Take with or immediately following a meal.     Multiple Vitamins-Minerals (CENTRUM SILVER 50+WOMEN) TABS Take 1 tablet by mouth every evening.     pantoprazole  (PROTONIX ) 40 MG tablet Take 1 tablet (40 mg total) by mouth daily. 30 tablet 0   polyethylene glycol (MIRALAX  / GLYCOLAX ) 17 g packet Take 17 g by mouth daily. (Patient taking differently: Take 17 g by mouth as needed for moderate constipation.)     rosuvastatin  (CRESTOR ) 10 MG tablet Take 10 mg by mouth See admin instructions. Take 10 mg by mouth at bedtime on Sun/Wed only (twice a week)     senna-docusate (SENOKOT-S) 8.6-50 MG tablet Take 1 tablet by mouth at bedtime as needed for mild constipation.  30 tablet 0   sertraline  (ZOLOFT ) 50 MG tablet Take 75 mg by mouth at bedtime.     spironolactone (ALDACTONE) 25 MG tablet Take 25 mg by mouth daily.     SYSTANE COMPLETE PF 0.6 % SOLN Place 1 drop into both eyes in the morning, at noon, and at bedtime.     warfarin (JANTOVEN ) 2 MG tablet TAKE 1/2 TO 1 TABLET (1 TO 2MG ) DAILY OR AS DIRECTED  BY ANTICOAGULATION CLINIC 50 tablet 1   No current facility-administered medications for this visit.    HISTORY OF PRESENT ILLNESS:   Oncology History  Malignant neoplasm of overlapping sites of right breast in female, estrogen receptor positive (HCC)  05/10/2023 Initial Diagnosis   Malignant neoplasm of overlapping sites of right breast in female, estrogen receptor positive (HCC)   05/12/2023 Cancer Staging   Staging form: Breast, AJCC 8th Edition - Clinical stage from 05/12/2023: Stage IIIB (cT4, cN1, cM0, G2, ER+, PR+, HER2-) - Signed by Lanny Callander, MD on 05/12/2023 Stage prefix: Initial diagnosis Histologic grading system: 3 grade system       REVIEW OF SYSTEMS:   Constitutional: Denies fevers, chills or abnormal weight loss Eyes: Denies blurriness of vision.  Has tenderness underneath the eyes adjacent to bruising. Ears, nose, mouth, throat, and face: Denies mucositis or sore throat.  Tenderness of the nasal bridge and forehead and areas of bruising. Respiratory: Denies cough, dyspnea or wheezes Cardiovascular: Denies palpitation, chest discomfort or lower extremity swelling Gastrointestinal:  Denies nausea, heartburn or change in bowel habits Skin: Denies abnormal skin rashes Lymphatics: Denies new lymphadenopathy or easy bruising Neurological:Denies numbness, tingling or new weaknesses.  Reports her strength level at baseline. Behavioral/Psych: Mood is stable, no new changes  All other systems were reviewed with the patient and are negative.   VITALS:   Today's Vitals   07/28/23 1357 07/28/23 1435  BP: 120/72   Pulse: (!) 111    Resp: 17   Temp: 98 F (36.7 C)   SpO2: 94%  Weight: 118 lb (53.5 kg)   PainSc:  5    Body mass index is 23.83 kg/m.   Wt Readings from Last 3 Encounters:  07/28/23 118 lb (53.5 kg)  07/28/23 118 lb (53.5 kg)  05/12/23 118 lb 1.6 oz (53.6 kg)    Body mass index is 23.83 kg/m.  Performance status (ECOG): 2 - Symptomatic, <50% confined to bed  PHYSICAL EXAM:   GENERAL:alert, no distress and comfortable.  Significant facial bruising. SKIN: skin color, texture, turgor are normal, no rashes or significant lesions EYES: normal, Conjunctiva are pink and non-injected, sclera clear.  Moderate bruising underneath the eyes. OROPHARYNX:no exudate, no erythema and lips, buccal mucosa, and tongue normal  NECK: supple, thyroid  normal size, non-tender, without nodularity LYMPH:  no palpable lymphadenopathy in the cervical, axillary or inguinal LUNGS: clear to auscultation and percussion with normal breathing effort HEART: regular rate & rhythm and no murmurs and no lower extremity edema ABDOMEN:abdomen soft, non-tender and normal bowel sounds Musculoskeletal:no cyanosis of digits and no clubbing  NEURO: alert & oriented x 3 with fluent speech, no focal motor/sensory deficits  LABORATORY DATA:  I have reviewed the data as listed    Component Value Date/Time   NA 132 (L) 07/28/2023 1330   K 5.1 07/28/2023 1330   CL 100 07/28/2023 1330   CO2 26 07/28/2023 1330   GLUCOSE 96 07/28/2023 1330   BUN 41 (H) 07/28/2023 1330   BUN 12 12/11/2011 0000   CREATININE 1.93 (H) 07/28/2023 1330   CALCIUM  9.3 07/28/2023 1330   PROT 6.8 07/28/2023 1330   ALBUMIN 4.0 07/28/2023 1330   AST 19 07/28/2023 1330   ALT 19 07/28/2023 1330   ALKPHOS 90 07/28/2023 1330   BILITOT 0.4 07/28/2023 1330   GFRNONAA 25 (L) 07/28/2023 1330     Lab Results  Component Value Date   WBC 6.9 07/28/2023   NEUTROABS 4.7 07/28/2023   HGB 9.6 (L) 07/28/2023   HCT 29.9 (L) 07/28/2023   MCV 94.9 07/28/2023   PLT  216 07/28/2023

## 2023-07-28 NOTE — ED Notes (Signed)
 Hospitalist at bedside

## 2023-07-28 NOTE — ED Notes (Signed)
 Patient's spouse called staff to patient's room. Patient seizing that this moment. Dr. Patsey called to bedside. 1 mg of Ativan  IV infiltrated while giving it.

## 2023-07-28 NOTE — H&P (Addendum)
 History and Physical  Brandi [name] FMW:[id number] DOB: January 04, 1941 DOA: 07/28/2023  PCP: Addie Camellia CROME, MD   Chief Complaint: Fall  HPI: Brandi [name] is a 83 y.o. female with medical history significant for A-fib/a flutter on warfarin, HTN, HLD, venous insufficiency, frequent falls, breast cancer, CKD 3B and depression who presented to the ED from the cancer center after recent fall. Patient reports she was using the commode very early on 7/5 and while she was standing up, she felt dizzy and passed out, hitting her forehead on the bath. She had difficulty getting up so she laid on the floor for few hours. Spouse states he found patient on the bathroom floor bleeding from the forehead and the nose so he called his son who helped get patient up. Patient was shivering but did not have any acute complaints. Patient slept today afternoon and was able to ambulate with a walker for the rest of the day. Patient was seen at the cancer center today and was advised to present to the ED due to her trauma from the recent fall. Patient endorsed mild dizziness while in the ED but denies any chest pain, shortness of breath, palpitations, nausea, vomiting, weakness, vision changes or headache.  ED Course: Initial vitals show patient afebrile, tachycardic with HR in the 90s to 110s but normotensive, appropriate O2 sat of 94% on room air. Initial labs significant for sodium 132, K+ 5.1, creatinine 1.93, WBC 6.9, Hgb 9.6, platelet 216, PT/INR 30.7/2.8. EKG shows a flutter. CT head shows 4-5 mm acute right posterior parafalcine subdural hematoma. Pt received IV Kcentra  1598 units and IV vitamin K  10 mg x 1 for warfarin reversal. Neurosurgery was consulted for evaluation. PCCM was consulted however due to patient having no acute neurological deficits, recommended admission to stepdown. TRH was consulted for admission.   After admission, patient's spouse witnessed patient seizing while in the ED.  EDP was called to the  bedside and patient received Ativan  1 mg x 1.  Patient was somnolent after this episode.  Review of Systems: Please see HPI for pertinent positives and negatives. A complete 10 system review of systems are otherwise negative.  Past Medical History:  Diagnosis Date   Allergy    Atrial fibrillation (HCC)    Dysrhythmia    Hyperlipidemia    Hypertension    Neuromuscular disorder (HCC)    Venous insufficiency    Past Surgical History:  Procedure Laterality Date   ANKLE SURGERY Left    INTRAMEDULLARY (IM) NAIL INTERTROCHANTERIC Left 11/11/2022   Procedure: INTRAMEDULLARY (IM) NAIL INTERTROCHANTERIC;  Surgeon: Edna Toribio LABOR, MD;  Location: [facility];  Service: Orthopedics;  Laterality: Left;   KNEE SURGERY Left    VARICOSE VEIN SURGERY     Social History:  reports that she has never smoked. She has never used smokeless tobacco. She reports that she does not drink alcohol  and does not use drugs.  Allergies  Allergen Reactions   Amoxil [Amoxicillin] Diarrhea   Celebrex [Celecoxib] Other (See Comments)    Bruising and stomach pain    Family History  Problem Relation Age of Onset   Heart attack Father      Prior to Admission medications   Medication Sig Start Date End Date Taking? Authorizing Provider  acetaminophen  (TYLENOL ) 500 MG tablet Take 1,000 mg by mouth every 6 (six) hours as needed for mild pain.    [provider]  anastrozole  (ARIMIDEX ) 1 MG tablet Take 1 tablet (1 mg total) by mouth  daily. 05/12/23   Lanny Callander, MD  azelastine  (ASTELIN ) 0.1 % nasal spray Place 2 sprays into both nostrils daily as needed for rhinitis or allergies. 06/07/19   [provider]  cetirizine (ZYRTEC) 10 MG tablet Take 10 mg by mouth daily as needed for allergies.    [provider]  clotrimazole-betamethasone (LOTRISONE) cream Apply 1 Application topically as needed (rash). 01/05/23   [provider]  diltiazem  (CARDIZEM  SR) 60 MG 12 hr capsule Take 60 mg  by mouth every 12 (twelve) hours.    [provider]  ferrous sulfate  325 (65 FE) MG tablet Take 325 mg by mouth daily with breakfast.    [provider]  guanFACINE  (TENEX ) 2 MG tablet Take 2 mg by mouth every evening. 09/19/21   [provider]  Lidocaine  HCl (ASPERCREME LIDOCAINE ) 4 % LIQD Apply 1 Application topically 2 (two) times daily as needed (knee pain).    [provider]  metoprolol  succinate (TOPROL -XL) 50 MG 24 hr tablet Take 25 mg by mouth in the morning and at bedtime. Take with or immediately following a meal.    [provider]  Multiple Vitamins-Minerals (CENTRUM SILVER 50+WOMEN) TABS Take 1 tablet by mouth every evening.    [provider]  pantoprazole  (PROTONIX ) 40 MG tablet Take 1 tablet (40 mg total) by mouth daily. 07/12/21   Singh, Prashant K, MD  polyethylene glycol (MIRALAX  / GLYCOLAX ) 17 g packet Take 17 g by mouth daily. Patient taking differently: Take 17 g by mouth as needed for moderate constipation. 11/17/22   Raenelle Coria, MD  rosuvastatin  (CRESTOR ) 10 MG tablet Take 10 mg by mouth See admin instructions. Take 10 mg by mouth at bedtime on Sun/Wed only (twice a week) 10/10/19   [provider]  senna-docusate (SENOKOT-S) 8.6-50 MG tablet Take 1 tablet by mouth at bedtime as needed for mild constipation. 08/30/22   Lue Elsie BROCKS, MD  sertraline  (ZOLOFT ) 50 MG tablet Take 75 mg by mouth at bedtime.    [provider]  spironolactone (ALDACTONE) 25 MG tablet Take 25 mg by mouth daily. 04/15/23   [provider]  SYSTANE COMPLETE PF 0.6 % SOLN Place 1 drop into both eyes in the morning, at noon, and at bedtime.    [provider]  warfarin (JANTOVEN ) 2 MG tablet TAKE 1/2 TO 1 TABLET (1 TO 2MG ) DAILY OR AS DIRECTED  BY ANTICOAGULATION CLINIC 03/26/23   Perla Evalene PARAS, MD    Physical Exam: BP (!) 140/101 (BP Location: Left Arm)   Pulse 96   Temp 97.9 F (36.6 C) (Oral)    Resp 18   Ht 4' 11 (1.499 m)   Wt 53.5 kg   SpO2 93%   BMI 23.83 kg/m  General: Pleasant, chronically ill elderly woman laying in bed. No acute distress. HEENT: Moderate size forehead hematoma with dried blood, surrounding ecchymosis on forehead with no significant tenderness. Racoon eyes mild right conjunctival injection. EOMI. PERRLA. CV: Tachycardic. Irregular rhythm. No murmurs, rubs, or gallops. Trace BLE edema. Pulmonary: Lungs CTAB. Normal effort. No wheezing or rales. Abdominal: Soft, nontender, nondistended. Normal bowel sounds. Extremities: Palpable radial and DP pulses. Normal ROM. Skin: Warm and dry. Bruises around the eyes and on the forehead with mild jaundice on the upper aspect of the face. (See media tab) Neuro: A&Ox3. Moves all extremities. Normal grip strength. Strength 4/5 in all extremities.  Normal sensation to light touch. No focal deficit. Psych: Normal mood and affect  Labs on Admission:  Basic Metabolic Panel: Recent Labs  Lab 07/28/23 1330  NA 132*  K 5.1  CL 100  CO2 26  GLUCOSE 96  BUN 41*  CREATININE 1.93*  CALCIUM  9.3   Liver Function Tests: Recent Labs  Lab 07/28/23 1330  AST 19  ALT 19  ALKPHOS 90  BILITOT 0.4  PROT 6.8  ALBUMIN 4.0   No results for input(s): LIPASE, AMYLASE in the last 168 hours. No results for input(s): AMMONIA in the last 168 hours. CBC: Recent Labs  Lab 07/28/23 1330  WBC 6.9  NEUTROABS 4.7  HGB 9.6*  HCT 29.9*  MCV 94.9  PLT 216   Cardiac Enzymes: No results for input(s): CKTOTAL, CKMB, CKMBINDEX, TROPONINI in the last 168 hours. BNP (last 3 results) No results for input(s): BNP in the last 8760 hours.  ProBNP (last 3 results) No results for input(s): PROBNP in the last 8760 hours.  CBG: No results for input(s): GLUCAP in the last 168 hours.  Radiological Exams on Admission: CT HEAD WO CONTRAST ( ) Result Date: 07/28/2023 CLINICAL DATA:  Subdural hematoma EXAM: CT  HEAD WITHOUT CONTRAST TECHNIQUE: Contiguous axial images were obtained from the base of the skull through the vertex without intravenous contrast. RADIATION DOSE REDUCTION: This exam was performed according to the departmental dose-optimization program which includes automated exposure control, adjustment of the mA and/or kV according to patient size and/or use of iterative reconstruction technique. COMPARISON:  CT of the head dated July 28, 2023. FINDINGS: Brain: The patient's right parafalcine and supratentorial subdural hematoma has remained stable in the interim, measuring up to approximately 4 mm in thickness, as before. There is no significant mass effect upon the brain parenchyma. There is moderate generalized cerebral volume loss and moderate periventricular white matter disease. Vascular: Mild calcific atheromatous disease. Skull: Intact and unremarkable. There is soft tissue swelling of the forehead to the left of midline. Sinuses/Orbits: Complete opacification of the right maxillary sinus and moderate opacification of the right ethmoid air cells. Status post bilateral lens replacement. Interval development of multiple small bubbles of air within the right orbit, periorbital soft tissues and cavernous sinuses, likely iatrogenic. No facial fractures evident. Other: Interval development of multiple bubbles of air within the right orbit, cavernous sinus and soft tissues. IMPRESSION: 1. Stable right parafalcine and supratentorial subdural hematoma. 2. Age-related atrophy and mild to moderate cerebral white matter disease. 3. Numerous bubbles of air are now present, likely iatrogenic. Electronically Signed   By: Evalene Coho M.D.   On: 07/28/2023 20:20   DG Chest 1 View Result Date: 07/28/2023 CLINICAL DATA:  Fall. EXAM: CHEST  1 VIEW COMPARISON:  November 10, 2022. FINDINGS: Mild cardiomegaly. Both lungs are clear. The visualized skeletal structures are unremarkable. IMPRESSION: No active disease.  Electronically Signed   By: Lynwood Landy Raddle M.D.   On: 07/28/2023 17:33   DG Pelvis 1-2 Views Result Date: 07/28/2023 CLINICAL DATA:  Fall. EXAM: PELVIS - 1-2 VIEW COMPARISON:  August 25, 2022. FINDINGS: Status post surgical internal fixation of old proximal left femoral fracture. Old right inferior pubic ramus fracture is noted. No acute fracture or dislocation is noted. IMPRESSION: No acute abnormality seen. Electronically Signed   By: Lynwood Landy Raddle M.D.   On: 07/28/2023 17:32   CT Head Wo Contrast Result Date: 07/28/2023 CLINICAL DATA:  Head trauma, moderate-severe. Fall, racoon eyes present, on warfarin; Facial trauma, blunt EXAM: CT HEAD WITHOUT CONTRAST CT MAXILLOFACIAL WITHOUT CONTRAST CT CERVICAL SPINE WITHOUT CONTRAST  TECHNIQUE: Multidetector CT imaging of the head, cervical spine, and maxillofacial structures were performed using the standard protocol without intravenous contrast. Multiplanar CT image reconstructions of the cervical spine and maxillofacial structures were also generated. RADIATION DOSE REDUCTION: This exam was performed according to the departmental dose-optimization program which includes automated exposure control, adjustment of the mA and/or kV according to patient size and/or use of iterative reconstruction technique. COMPARISON:  11/10/2022 FINDINGS: CT HEAD FINDINGS Brain: Acute right posterior parafalcine subdural hematoma is seen layering along the right tentorium extending into the right middle cranial fossa, measuring up to 4-5 mm in thickness (15/2, 23/6). No subarachnoid, intraparenchymal, or intraventricular hemorrhage identified. No abnormal mass effect or midline shift. Moderate parenchymal volume loss is commensurate with the patient's age. Mild periventricular white matter changes are present likely reflecting the sequela of small vessel ischemia. Ventricular size is normal. Cerebellum is unremarkable. Vascular: No hyperdense vessel or unexpected calcification. Skull:  Normal. Negative for fracture or focal lesion. Other: Mastoid air cells and middle ear cavities are clear. Small frontal scalp hematoma. CT MAXILLOFACIAL FINDINGS Osseous: No fracture or mandibular dislocation. No destructive process. Orbits: Negative. No traumatic or inflammatory finding. Ocular lenses of been removed. Sinuses: There is again noted dense opacification of the right maxillary sinus, partially visualized on head CT 11/10/2022, with mineralization of the hyperdense intraluminal contents and slight thickening of the sinus walls in keeping with changes of chronic sinusitis. Several opacified anterior right ethmoid air cells again identified. Remaining paranasal sinuses are clear Soft tissues: Negative. CT CERVICAL SPINE FINDINGS Alignment: Stable 4 mm anterolisthesis C3-4, 2 mm anterolisthesis C4-5, and 2 mm retrolisthesis C5-6. No acute traumatic listhesis identified. Skull base and vertebrae: Right cervical alignment is normal. The atlantodental interval is not widened. No acute fracture of the cervical spine. Ankylosis of the right C2-3 facet joint noted. Soft tissues and spinal canal: No prevertebral fluid or swelling. No visible canal hematoma. 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 Multilevel uncovertebral and facet arthrosis results in multilevel moderate to severe neuroforaminal narrowing, most severe on the right at C3-4 and bilaterally at C5-C7. Moderate central canal stenosis secondary to posterior disc osteophyte complex at C5-6 and to a lesser extent C6-7 with flattening of the thecal sac and in a AP diameter of the spinal canal approximately 6 mm. Prevertebral soft tissues are not thickened on sagittal reformats. Upper chest: Negative. Other: None Traumatic Brain Injury Risk Stratification Skull Fracture: No - Low/mBIG 1 Subdural Hematoma  (SDH): 4mm to <32mm - mBIG 2 Subarachnoid Hemorrhage [facility]): No Epidural Hematoma (EDH): No - Low/mBIG 1 Cerebral contusion, intra-axial, intraparenchymal Hemorrhage (IPH): No Intraventricular Hemorrhage (IVH): No - Low/mBIG 1 Midline Shift > 1mm or Edema/effacement of sulci/vents: No - Low/mBIG 1 ---------------------------------------------------- IMPRESSION: 1. Imaging criteria compatible with mBIG grade 2 traumatic brain injury, as outlined above. Acute right posterior parafalcine subdural hematoma extending along the right tentorium extending into the right middle cranial fossa, measuring up to 4-5 mm in thickness. No abnormal mass effect or midline shift. 2. Small frontal scalp hematoma without underlying skull fracture. 3. No acute facial bone fracture. 4. No acute intracranial abnormality. 5. No acute fracture or traumatic malalignment of the cervical spine. 6. 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 7.  Chronic right maxillary sinusitis. These results were called by telephone at the time of interpretation on 07/28/2023 at 5:12 pm to provider COLLIN HINNANT , who verbally acknowledged these results. Electronically Signed   By: Dorethia Molt M.D.   On: 07/28/2023 17:12   CT Cervical Spine Wo Contrast Result Date: 07/28/2023 CLINICAL DATA:  Head trauma, moderate-severe. Fall, racoon eyes present, on warfarin; Facial trauma, blunt EXAM: CT HEAD WITHOUT CONTRAST CT MAXILLOFACIAL WITHOUT CONTRAST CT CERVICAL SPINE WITHOUT CONTRAST TECHNIQUE: Multidetector CT imaging of the head, cervical spine, and maxillofacial structures were performed using the standard protocol without intravenous contrast. Multiplanar CT image reconstructions of the cervical spine and maxillofacial structures were also generated. RADIATION DOSE REDUCTION: This exam was performed  according to the departmental dose-optimization program which includes automated exposure control, adjustment of the mA and/or kV according to patient size and/or use of iterative reconstruction technique. COMPARISON:  11/10/2022 FINDINGS: CT HEAD FINDINGS Brain: Acute right posterior parafalcine subdural hematoma is seen layering along the right tentorium extending into the right middle cranial fossa, measuring up to 4-5 mm in thickness (15/2, 23/6). No subarachnoid, intraparenchymal, or intraventricular hemorrhage identified. No abnormal mass effect or midline shift. Moderate parenchymal volume loss is commensurate with the patient's age. Mild periventricular white matter changes are present likely reflecting the sequela of small vessel ischemia. Ventricular size is normal. Cerebellum is unremarkable. Vascular: No hyperdense vessel or unexpected calcification. Skull: Normal. Negative for fracture or focal lesion. Other: Mastoid air cells and middle ear cavities are clear. Small frontal scalp hematoma. CT MAXILLOFACIAL FINDINGS Osseous: No fracture or mandibular dislocation. No destructive process. Orbits: Negative. No traumatic or inflammatory finding. Ocular lenses of been removed. Sinuses: There is again noted dense opacification of the right maxillary sinus, partially visualized on head CT 11/10/2022, with mineralization of the hyperdense intraluminal contents and slight thickening of the sinus walls in keeping with changes of chronic sinusitis. Several opacified anterior right ethmoid air cells again identified. Remaining paranasal sinuses are clear Soft tissues: Negative. CT CERVICAL SPINE FINDINGS Alignment: Stable 4 mm anterolisthesis C3-4, 2 mm anterolisthesis C4-5, and 2 mm retrolisthesis C5-6. No acute traumatic listhesis identified. Skull base and vertebrae: Right cervical alignment is normal. The atlantodental interval is not widened. No acute fracture of the cervical spine. Ankylosis of the right C2-3  facet joint noted. Soft tissues and spinal canal: No prevertebral fluid or swelling. No visible canal hematoma. 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 Multilevel uncovertebral and facet arthrosis results in multilevel moderate to severe neuroforaminal narrowing, most severe on the right at C3-4 and bilaterally at C5-C7. Moderate central canal stenosis secondary to posterior disc osteophyte complex at C5-6 and to a lesser extent C6-7 with flattening of the thecal sac and in a AP diameter of the spinal canal approximately 6 mm. Prevertebral soft tissues are not thickened on sagittal reformats. Upper chest: Negative. Other: None Traumatic Brain Injury Risk Stratification Skull Fracture: No - Low/mBIG 1 Subdural Hematoma (SDH): 4mm to <66mm - mBIG 2 Subarachnoid Hemorrhage [facility]): No Epidural Hematoma (EDH): No - Low/mBIG 1 Cerebral contusion, intra-axial, intraparenchymal Hemorrhage (IPH): No Intraventricular Hemorrhage (IVH): No - Low/mBIG 1 Midline Shift > 1mm or Edema/effacement of sulci/vents: No - Low/mBIG 1 ---------------------------------------------------- IMPRESSION: 1. Imaging criteria compatible with mBIG grade 2 traumatic brain injury, as outlined above. Acute right posterior parafalcine subdural hematoma extending along the right  tentorium extending into the right middle cranial fossa, measuring up to 4-5 mm in thickness. No abnormal mass effect or midline shift. 2. Small frontal scalp hematoma without underlying skull fracture. 3. No acute facial bone fracture. 4. No acute intracranial abnormality. 5. No acute fracture or traumatic malalignment of the cervical spine. 6. 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 7. Chronic right maxillary sinusitis. These results were called by telephone at the time of interpretation on 07/28/2023 at 5:12 pm to provider COLLIN HINNANT , who verbally acknowledged these results. Electronically Signed   By: Dorethia Molt M.D.   On: 07/28/2023 17:12   CT Maxillofacial Wo Contrast Result Date: 07/28/2023 CLINICAL DATA:  Head trauma, moderate-severe. Fall, racoon eyes present, on warfarin; Facial trauma, blunt EXAM: CT HEAD WITHOUT CONTRAST CT MAXILLOFACIAL WITHOUT CONTRAST CT CERVICAL SPINE WITHOUT CONTRAST TECHNIQUE: Multidetector CT imaging of the head, cervical spine, and maxillofacial structures were performed using the standard protocol without intravenous contrast. Multiplanar CT image reconstructions of the cervical spine and maxillofacial structures were also generated. RADIATION DOSE REDUCTION: This exam was performed according to the departmental dose-optimization program which includes automated exposure control, adjustment of the mA and/or kV according to patient size and/or use of iterative reconstruction technique. COMPARISON:  11/10/2022 FINDINGS: CT HEAD FINDINGS Brain: Acute right posterior parafalcine subdural hematoma is seen layering along the right tentorium extending into the right middle cranial fossa, measuring up to 4-5 mm in thickness (15/2, 23/6). No subarachnoid, intraparenchymal, or intraventricular hemorrhage identified. No abnormal mass effect or midline shift. Moderate parenchymal volume loss is commensurate with the patient's age. Mild periventricular white matter changes are present likely reflecting the sequela of small vessel ischemia. Ventricular size is normal. Cerebellum is unremarkable. Vascular: No hyperdense vessel or unexpected calcification. Skull: Normal. Negative for fracture or focal lesion. Other: Mastoid air cells and middle ear cavities are clear. Small frontal scalp hematoma. CT MAXILLOFACIAL FINDINGS Osseous: No fracture or  mandibular dislocation. No destructive process. Orbits: Negative. No traumatic or inflammatory finding. Ocular lenses of been removed. Sinuses: There is again noted dense opacification of the right maxillary sinus, partially visualized on head CT 11/10/2022, with mineralization of the hyperdense intraluminal contents and slight thickening of the sinus walls in keeping with changes of chronic sinusitis. Several opacified anterior right ethmoid air cells again identified. Remaining paranasal sinuses are clear Soft tissues: Negative. CT CERVICAL SPINE FINDINGS Alignment: Stable 4 mm anterolisthesis C3-4, 2 mm anterolisthesis C4-5, and 2 mm retrolisthesis C5-6. No acute traumatic listhesis identified. Skull base and vertebrae: Right cervical alignment is normal. The atlantodental interval is not widened. No acute fracture of the cervical spine. Ankylosis of the right C2-3 facet joint noted. Soft tissues and spinal canal: No prevertebral fluid or swelling. No visible canal hematoma. 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 Multilevel uncovertebral and facet arthrosis results in multilevel moderate to severe neuroforaminal narrowing, most severe on the right at C3-4 and bilaterally at C5-C7. Moderate central canal stenosis secondary to posterior disc osteophyte complex at C5-6 and to a lesser extent C6-7 with flattening of the thecal sac and in a AP diameter of the spinal canal approximately 6 mm. Prevertebral soft tissues are not thickened on sagittal reformats. Upper chest: Negative. Other: None Traumatic  Brain Injury Risk Stratification Skull Fracture: No - Low/mBIG 1 Subdural Hematoma (SDH): 4mm to <34mm - mBIG 2 Subarachnoid Hemorrhage [facility]): No Epidural Hematoma (EDH): No - Low/mBIG 1 Cerebral contusion, intra-axial, intraparenchymal Hemorrhage (IPH): No  Intraventricular Hemorrhage (IVH): No - Low/mBIG 1 Midline Shift > 1mm or Edema/effacement of sulci/vents: No - Low/mBIG 1 ---------------------------------------------------- IMPRESSION: 1. Imaging criteria compatible with mBIG grade 2 traumatic brain injury, as outlined above. Acute right posterior parafalcine subdural hematoma extending along the right tentorium extending into the right middle cranial fossa, measuring up to 4-5 mm in thickness. No abnormal mass effect or midline shift. 2. Small frontal scalp hematoma without underlying skull fracture. 3. No acute facial bone fracture. 4. No acute intracranial abnormality. 5. No acute fracture or traumatic malalignment of the cervical spine. 6. 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 7. Chronic right maxillary sinusitis. These results were called by telephone at the time of interpretation on 07/28/2023 at 5:12 pm to provider COLLIN HINNANT , who verbally acknowledged these results. Electronically Signed   By: Dorethia Molt M.D.   On: 07/28/2023 17:12   Assessment/Plan Brandi [name] is a 83 y.o. female with medical history significant for A-fib/a flutter on warfarin, HTN, HLD, venous insufficiency, frequent falls, breast cancer, CKD 3B and depression who presented to the ED from the cancer center after recent fall found to have subdural hematoma.  # Subdural hematoma # Fall # Seizure - Elderly patient presented after syncope episode 4 days ago resulting in head trauma - Patient with significant bruise and forehead hematoma but no acute neurological deficits prior to arrival - CT head shows 4-5 mm right parafalcine and supratentorial subdural hematoma - Neurosurgery consulted, recommends observation overnight with repeat CT in the morning - Patient with 1 episode of seizure while in the ED  likely secondary to her subdural hematoma, no seizure history - Repeat CT head with no significant change in the size of the hematoma - Repeat evaluation shows patient slightly sleepy but answers question appropriately with no acute neurological changes - Admit to stepdown for close neuro-monitoring - Warfarin reversal with IV Kcentra  and IV vitamin K , appreciate pharmacy assistance - Follow-up repeat CT tomorrow morning - PT/OT eval and treat - Fall and seizure precautions  # Syncope - Patient presented after syncope episodes 4 days ago while in her bathroom - Reported some lightheadedness prior to the episode which resulted in a head trauma - Concern for possible vasovagal or orthostatic hypotension however cannot rule out cardiac etiology due to history of arrhythmia - Start IV NS 100 cc/h for 1 day - Patient hypertensive, check orthostatic vitals - Check echocardiogram - Telemetry  # AKI on CKD 3B # Hyponatremia, mild - Creatinine elevated to 1.93, from baseline of 1.3-1.5, sodium slightly low at 132 - Likely secondary to mild hypovolemia - Start IV NS at 100 cc/hr - Trend renal function - Avoid nephrotoxic meds  # Hx of A-fib/A-flutter - EKG on admission shows a flutter, PT/INR elevated to 30.7/2.8 - HR slightly elevated to the 90s-110s - Continue Toprol  XL and diltiazem  - Hold warfarin - Trend and replete electrolytes - Telemetry  # HTN - BP elevated with SBP in the 120s to 150s - Continue Toprol -XL, and diltiazem  - Hold spironolactone in the setting of AKI  # Normocytic anemia # IDA - Hgb stable at 9.6 - Continue oral  iron supplementation  # Breast cancer, stage IIIb - Patient with invasive ductal carcinoma of the right breast, ER positive, PR positive, HER2 negative - Followed by oncology, not on chemo due to age and health status - Continue anastrozole   # HLD - Continue rosuvastatin   # GERD - Continue Protonix   # Depression - Continue sertraline   #  Chronic venous sufficiency - Trace BLE edema on exam - Apply TED hose  DVT prophylaxis: TED hose    Code Status: Limited: Do not attempt resuscitation (DNR) -DNR-LIMITED -Do Not Intubate/DNI   Consults called: Neurosurgery  Family Communication: Discussed admission with spouse at bedside  Severity of Illness: The appropriate patient status for this patient is OBSERVATION. Observation status is judged to be reasonable and necessary in order to provide the required intensity of service to ensure the patient's safety. The patient's presenting symptoms, physical exam findings, and initial radiographic and laboratory data in the context of their medical condition is felt to place them at decreased risk for further clinical deterioration. Furthermore, it is anticipated that the patient will be medically stable for discharge from the hospital within 2 midnights of admission.   Level of care: Stepdown   This record has been created using Conservation officer, historic buildings. Errors have been sought and corrected, but may not always be located. Such creation errors do not reflect on the standard of care.   Lou Claretta HERO, MD 07/28/2023, 8:37 PM [facility] Pager: [phone] Isaiah 41:10   If 7PM-7AM, please contact night-coverage [email or web address] Password [email or web address] .

## 2023-07-28 NOTE — Consult Note (Addendum)
 I was contacted by Terrall Learn regarding this patient.  She is an 83 year old female with a history of breast cancer on Coumadin  for A-fib who fell off the toilet onto a tile floor on 07/24/2023.  The patient went to the cancer center for treatment of her breast cancer and was noted to have facial bruising.  They sent her to the ER and a CAT scan was obtained which demonstrated a right interhemispheric and tentorial subdural hematoma.  They are in the process of reversing her Coumadin .  I discussed this with Terrall.  By report she is neurologically normal.  I would suggest that she be admitted for observation and plan to repeat her CAT scan tomorrow.  We can follow-up with her CAT scan from afar.

## 2023-07-28 NOTE — ED Notes (Signed)
 ED TO INPATIENT HANDOFF REPORT  Name/Age/Gender Brandi [name] 83 y.o. female  Code Status Code Status History     Date Active Date Inactive Code Status Order ID Comments User Context   11/10/2022 2105 11/16/2022 1802 Limited: Do not attempt resuscitation (DNR) -DNR-LIMITED -Do Not Intubate/DNI  [id number]  Jimmy Anna SAILOR, DO ED   11/10/2022 2047 11/10/2022 2105 Full Code [id number]  Jimmy Anna SAILOR, DO ED   08/25/2022 2213 08/30/2022 1807 DNR [id number]  Tobie Jorie SAUNDERS, MD ED   08/14/2021 1414 08/16/2021 1741 Partial Code [id number]  Claudene Maximino LABOR, MD ED   08/14/2021 1224 08/14/2021 1414 Full Code [id number]  Claudene Maximino LABOR, MD ED   07/07/2021 1951 07/12/2021 1739 Partial Code [id number]  Seena Marsa NOVAK, MD ED   07/07/2021 1840 07/07/2021 1951 Full Code [id number]  Seena Marsa NOVAK, MD ED    Questions for Most Recent Historical Code Status (Order [id number])     Question Answer   If pulseless and not breathing No CPR or chest compressions.   In Pre-Arrest Conditions (Patient Is Breathing and Has A Pulse) Do not intubate. Provide all appropriate non-invasive medical interventions. Avoid ICU transfer unless indicated or required.   Consent: Discussion documented in EHR or advanced directives reviewed            Home/SNF/Other Home  Chief Complaint Subdural hematoma (HCC) [S06.5XAA]  Level of Care/Admitting Diagnosis ED Disposition     ED Disposition  Admit   Condition  --   Comment  Hospital Area: [facility] [[id number]]  Level of Care: Stepdown [14]  Admit to SDU based on following criteria: Other see comments  Comments: Close neuro monitoring required  May place patient in observation at [facility] or [facility] if equivalent level of care is available:: No  Covid Evaluation: Asymptomatic - no recent exposure (last 10 days) testing not required  Diagnosis: Subdural hematoma [facility]) [[id number]]  Admitting Physician: LOU CLARETTA HERO [[id number]]   Attending Physician: LOU CLARETTA HERO [phone]  For patients discharging to extended facilities (i.e. SNF, AL, group homes or LTAC) initiate:: Discharge to SNF/Facility Placement COVID-19 Lab Testing Protocol          Medical History Past Medical History:  Diagnosis Date   Allergy    Atrial fibrillation (HCC)    Dysrhythmia    Hyperlipidemia    Hypertension    Neuromuscular disorder (HCC)    Venous insufficiency     Allergies Allergies  Allergen Reactions   Amoxil [Amoxicillin] Diarrhea   Celebrex [Celecoxib] Other (See Comments)    Bruising and stomach pain    IV Location/Drains/Wounds Patient Lines/Drains/Airways Status     Active Line/Drains/Airways     Name Placement date Placement time Site Days   Peripheral IV 07/28/23 20 G Distal;Left;Posterior Forearm 07/28/23  1826  Forearm  less than 1   Urethral Catheter Medford Pouch, RN/ Doyal Batman, NT3 Non-latex;Straight-tip 16 Fr. 11/16/22  0615  Non-latex;Straight-tip  254            Labs/Imaging Results for orders placed or performed during the hospital encounter of 07/28/23 (from the past 48 hours)  Protime-INR     Status: Abnormal   Collection Time: 07/28/23  4:49 PM  Result Value Ref Range   Prothrombin  Time 30.7 (H) 11.4 - 15.2 seconds   INR 2.8 (H) 0.8 - 1.2    Comment: (NOTE) INR goal varies based on device and disease states. Performed at [facility], [address]  [place], [place] [address]    DG Chest 1 View Result Date: 07/28/2023 CLINICAL DATA:  Fall. EXAM: CHEST  1 VIEW COMPARISON:  November 10, 2022. FINDINGS: Mild cardiomegaly. Both lungs are clear. The visualized skeletal structures are unremarkable. IMPRESSION: No active disease. Electronically Signed   By: Lynwood Landy Raddle M.D.   On: 07/28/2023 17:33   DG Pelvis 1-2 Views Result Date: 07/28/2023 CLINICAL DATA:  Fall. EXAM: PELVIS - 1-2 VIEW COMPARISON:  August 25, 2022. FINDINGS: Status post surgical internal fixation  of old proximal left femoral fracture. Old right inferior pubic ramus fracture is noted. No acute fracture or dislocation is noted. IMPRESSION: No acute abnormality seen. Electronically Signed   By: Lynwood Landy Raddle M.D.   On: 07/28/2023 17:32   CT Head Wo Contrast Result Date: 07/28/2023 CLINICAL DATA:  Head trauma, moderate-severe. Fall, racoon eyes present, on warfarin; Facial trauma, blunt EXAM: CT HEAD WITHOUT CONTRAST CT MAXILLOFACIAL WITHOUT CONTRAST CT CERVICAL SPINE WITHOUT CONTRAST TECHNIQUE: Multidetector CT imaging of the head, cervical spine, and maxillofacial structures were performed using the standard protocol without intravenous contrast. Multiplanar CT image reconstructions of the cervical spine and maxillofacial structures were also generated. RADIATION DOSE REDUCTION: This exam was performed according to the departmental dose-optimization program which includes automated exposure control, adjustment of the mA and/or kV according to patient size and/or use of iterative reconstruction technique. COMPARISON:  11/10/2022 FINDINGS: CT HEAD FINDINGS Brain: Acute right posterior parafalcine subdural hematoma is seen layering along the right tentorium extending into the right middle cranial fossa, measuring up to 4-5 mm in thickness (15/2, 23/6). No subarachnoid, intraparenchymal, or intraventricular hemorrhage identified. No abnormal mass effect or midline shift. Moderate parenchymal volume loss is commensurate with the patient's age. Mild periventricular white matter changes are present likely reflecting the sequela of small vessel ischemia. Ventricular size is normal. Cerebellum is unremarkable. Vascular: No hyperdense vessel or unexpected calcification. Skull: Normal. Negative for fracture or focal lesion. Other: Mastoid air cells and middle ear cavities are clear. Small frontal scalp hematoma. CT MAXILLOFACIAL FINDINGS Osseous: No fracture or mandibular dislocation. No destructive process. Orbits:  Negative. No traumatic or inflammatory finding. Ocular lenses of been removed. Sinuses: There is again noted dense opacification of the right maxillary sinus, partially visualized on head CT 11/10/2022, with mineralization of the hyperdense intraluminal contents and slight thickening of the sinus walls in keeping with changes of chronic sinusitis. Several opacified anterior right ethmoid air cells again identified. Remaining paranasal sinuses are clear Soft tissues: Negative. CT CERVICAL SPINE FINDINGS Alignment: Stable 4 mm anterolisthesis C3-4, 2 mm anterolisthesis C4-5, and 2 mm retrolisthesis C5-6. No acute traumatic listhesis identified. Skull base and vertebrae: Right cervical alignment is normal. The atlantodental interval is not widened. No acute fracture of the cervical spine. Ankylosis of the right C2-3 facet joint noted. Soft tissues and spinal canal: No prevertebral fluid or swelling. No visible canal hematoma. 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 Multilevel uncovertebral and facet arthrosis results in multilevel moderate to severe neuroforaminal narrowing, most severe on the right at C3-4 and bilaterally at C5-C7. Moderate central canal stenosis secondary to posterior disc osteophyte complex at C5-6 and to a lesser extent C6-7 with flattening of the thecal sac and in a AP diameter of the spinal canal approximately 6 mm. Prevertebral soft tissues are not thickened on sagittal reformats. Upper chest: Negative. Other:  None Traumatic Brain Injury Risk Stratification Skull Fracture: No - Low/mBIG 1 Subdural Hematoma (SDH): 4mm to <108mm - mBIG 2 Subarachnoid Hemorrhage [facility]): No Epidural Hematoma (EDH): No - Low/mBIG 1 Cerebral contusion, intra-axial, intraparenchymal Hemorrhage (IPH): No Intraventricular Hemorrhage (IVH): No - Low/mBIG 1 Midline Shift > 1mm  or Edema/effacement of sulci/vents: No - Low/mBIG 1 ---------------------------------------------------- IMPRESSION: 1. Imaging criteria compatible with mBIG grade 2 traumatic brain injury, as outlined above. Acute right posterior parafalcine subdural hematoma extending along the right tentorium extending into the right middle cranial fossa, measuring up to 4-5 mm in thickness. No abnormal mass effect or midline shift. 2. Small frontal scalp hematoma without underlying skull fracture. 3. No acute facial bone fracture. 4. No acute intracranial abnormality. 5. No acute fracture or traumatic malalignment of the cervical spine. 6. 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 7. Chronic right maxillary sinusitis. These results were called by telephone at the time of interpretation on 07/28/2023 at 5:12 pm to provider COLLIN HINNANT , who verbally acknowledged these results. Electronically Signed   By: Dorethia Molt M.D.   On: 07/28/2023 17:12   CT Cervical Spine Wo Contrast Result Date: 07/28/2023 CLINICAL DATA:  Head trauma, moderate-severe. Fall, racoon eyes present, on warfarin; Facial trauma, blunt EXAM: CT HEAD WITHOUT CONTRAST CT MAXILLOFACIAL WITHOUT CONTRAST CT CERVICAL SPINE WITHOUT CONTRAST TECHNIQUE: Multidetector CT imaging of the head, cervical spine, and maxillofacial structures were performed using the standard protocol without intravenous contrast. Multiplanar CT image reconstructions of the cervical spine and maxillofacial structures were also generated. RADIATION DOSE REDUCTION: This exam was performed according to the departmental dose-optimization program which includes automated exposure control, adjustment of the mA and/or kV according to patient size and/or use of iterative reconstruction technique. COMPARISON:  11/10/2022 FINDINGS: CT HEAD  FINDINGS Brain: Acute right posterior parafalcine subdural hematoma is seen layering along the right tentorium extending into the right middle cranial fossa, measuring up to 4-5 mm in thickness (15/2, 23/6). No subarachnoid, intraparenchymal, or intraventricular hemorrhage identified. No abnormal mass effect or midline shift. Moderate parenchymal volume loss is commensurate with the patient's age. Mild periventricular white matter changes are present likely reflecting the sequela of small vessel ischemia. Ventricular size is normal. Cerebellum is unremarkable. Vascular: No hyperdense vessel or unexpected calcification. Skull: Normal. Negative for fracture or focal lesion. Other: Mastoid air cells and middle ear cavities are clear. Small frontal scalp hematoma. CT MAXILLOFACIAL FINDINGS Osseous: No fracture or mandibular dislocation. No destructive process. Orbits: Negative. No traumatic or inflammatory finding. Ocular lenses of been removed. Sinuses: There is again noted dense opacification of the right maxillary sinus, partially visualized on head CT 11/10/2022, with mineralization of the hyperdense intraluminal contents and slight thickening of the sinus walls in keeping with changes of chronic sinusitis. Several opacified anterior right ethmoid air cells again identified. Remaining paranasal sinuses are clear Soft tissues: Negative. CT CERVICAL SPINE FINDINGS Alignment: Stable 4 mm anterolisthesis C3-4, 2 mm anterolisthesis C4-5, and 2 mm retrolisthesis C5-6. No acute traumatic listhesis identified. Skull base and vertebrae: Right cervical alignment is normal. The atlantodental interval is not widened. No acute fracture of the cervical spine. Ankylosis of the right C2-3 facet joint noted. Soft tissues and spinal canal: No prevertebral fluid or swelling. No visible canal hematoma. Disc levels: Disc space narrowing and endplate remodeling is seen throughout the cervical spine in keeping with changes of  moderate to  severe degenerative disc disease, most severe at C5-C7. Multilevel uncovertebral and facet arthrosis results in multilevel moderate to severe neuroforaminal narrowing, most severe on the right at C3-4 and bilaterally at C5-C7. Moderate central canal stenosis secondary to posterior disc osteophyte complex at C5-6 and to a lesser extent C6-7 with flattening of the thecal sac and in a AP diameter of the spinal canal approximately 6 mm. Prevertebral soft tissues are not thickened on sagittal reformats. Upper chest: Negative. Other: None Traumatic Brain Injury Risk Stratification Skull Fracture: No - Low/mBIG 1 Subdural Hematoma (SDH): 4mm to <81mm - mBIG 2 Subarachnoid Hemorrhage [facility]): No Epidural Hematoma (EDH): No - Low/mBIG 1 Cerebral contusion, intra-axial, intraparenchymal Hemorrhage (IPH): No Intraventricular Hemorrhage (IVH): No - Low/mBIG 1 Midline Shift > 1mm or Edema/effacement of sulci/vents: No - Low/mBIG 1 ---------------------------------------------------- IMPRESSION: 1. Imaging criteria compatible with mBIG grade 2 traumatic brain injury, as outlined above. Acute right posterior parafalcine subdural hematoma extending along the right tentorium extending into the right middle cranial fossa, measuring up to 4-5 mm in thickness. No abnormal mass effect or midline shift. 2. Small frontal scalp hematoma without underlying skull fracture. 3. No acute facial bone fracture. 4. No acute intracranial abnormality. 5. No acute fracture or traumatic malalignment of the cervical spine. 6. 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 7. Chronic right maxillary sinusitis. These results were called by telephone at the time of interpretation on 07/28/2023 at 5:12 pm to provider COLLIN HINNANT , who verbally acknowledged these results. Electronically Signed   By: Dorethia Molt M.D.   On: 07/28/2023 17:12   CT Maxillofacial Wo Contrast Result Date: 07/28/2023 CLINICAL DATA:  Head trauma, moderate-severe. Fall, racoon eyes present, on warfarin; Facial trauma, blunt EXAM: CT HEAD WITHOUT CONTRAST CT MAXILLOFACIAL WITHOUT CONTRAST CT CERVICAL SPINE WITHOUT CONTRAST TECHNIQUE: Multidetector CT imaging of the head, cervical spine, and maxillofacial structures were performed using the standard protocol without intravenous contrast. Multiplanar CT image reconstructions of the cervical spine and maxillofacial structures were also generated. RADIATION DOSE REDUCTION: This exam was performed according to the departmental dose-optimization program which includes automated exposure control, adjustment of the mA and/or kV according to patient size and/or use of iterative reconstruction technique. COMPARISON:  11/10/2022 FINDINGS: CT HEAD FINDINGS Brain: Acute right posterior parafalcine subdural hematoma is seen layering along the right tentorium extending into the right middle cranial fossa, measuring up to 4-5 mm in thickness (15/2, 23/6). No subarachnoid, intraparenchymal, or intraventricular hemorrhage identified. No abnormal mass effect or midline shift. Moderate parenchymal volume loss is commensurate with the patient's age. Mild periventricular white matter changes are present likely reflecting the sequela of small vessel ischemia. Ventricular size is normal. Cerebellum is unremarkable. Vascular: No hyperdense vessel or unexpected calcification. Skull: Normal. Negative for fracture or focal lesion. Other: Mastoid air cells and middle ear cavities are clear. Small frontal scalp hematoma. CT MAXILLOFACIAL FINDINGS Osseous: No fracture or mandibular dislocation. No destructive process. Orbits: Negative. No traumatic or inflammatory finding. Ocular lenses of been removed. Sinuses: There is again noted dense opacification of the right maxillary sinus, partially visualized on head CT  11/10/2022, with mineralization of the hyperdense intraluminal contents and slight thickening of the sinus walls in keeping with changes of chronic sinusitis. Several opacified anterior right ethmoid air cells again identified. Remaining paranasal sinuses are clear Soft tissues: Negative. CT CERVICAL SPINE FINDINGS  Alignment: Stable 4 mm anterolisthesis C3-4, 2 mm anterolisthesis C4-5, and 2 mm retrolisthesis C5-6. No acute traumatic listhesis identified. Skull base and vertebrae: Right cervical alignment is normal. The atlantodental interval is not widened. No acute fracture of the cervical spine. Ankylosis of the right C2-3 facet joint noted. Soft tissues and spinal canal: No prevertebral fluid or swelling. No visible canal hematoma. 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 Multilevel uncovertebral and facet arthrosis results in multilevel moderate to severe neuroforaminal narrowing, most severe on the right at C3-4 and bilaterally at C5-C7. Moderate central canal stenosis secondary to posterior disc osteophyte complex at C5-6 and to a lesser extent C6-7 with flattening of the thecal sac and in a AP diameter of the spinal canal approximately 6 mm. Prevertebral soft tissues are not thickened on sagittal reformats. Upper chest: Negative. Other: None Traumatic Brain Injury Risk Stratification Skull Fracture: No - Low/mBIG 1 Subdural Hematoma (SDH): 4mm to <49mm - mBIG 2 Subarachnoid Hemorrhage [facility]): No Epidural Hematoma (EDH): No - Low/mBIG 1 Cerebral contusion, intra-axial, intraparenchymal Hemorrhage (IPH): No Intraventricular Hemorrhage (IVH): No - Low/mBIG 1 Midline Shift > 1mm or Edema/effacement of sulci/vents: No - Low/mBIG 1 ---------------------------------------------------- IMPRESSION: 1. Imaging criteria compatible with mBIG grade 2 traumatic brain injury,  as outlined above. Acute right posterior parafalcine subdural hematoma extending along the right tentorium extending into the right middle cranial fossa, measuring up to 4-5 mm in thickness. No abnormal mass effect or midline shift. 2. Small frontal scalp hematoma without underlying skull fracture. 3. No acute facial bone fracture. 4. No acute intracranial abnormality. 5. No acute fracture or traumatic malalignment of the cervical spine. 6. 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 7. Chronic right maxillary sinusitis. These results were called by telephone at the time of interpretation on 07/28/2023 at 5:12 pm to provider COLLIN HINNANT , who verbally acknowledged these results. Electronically Signed   By: Dorethia Molt M.D.   On: 07/28/2023 17:12    Pending Labs Unresulted Labs (From admission, onward)     Start     Ordered   07/28/23 1536  Urinalysis, Routine w reflex microscopic -Urine, Clean Catch  Once,   URGENT       Question:  Specimen Source  Answer:  Urine, Clean Catch   07/28/23 1535            Vitals/Pain Today's Vitals   07/28/23 1459 07/28/23 1500 07/28/23 1800 07/28/23 1803  BP:    (!) 140/101  Pulse:    96  Resp:   16 18  Temp:    97.9 F (36.6 C)  TempSrc:    Oral  SpO2:    93%  Weight:  53.5 kg    Height:  4' 11 (1.499 m)    PainSc: 4        Isolation Precautions No active isolations  Medications Medications  prothrombin  complex conc human (KCENTRA ) IVPB 1,598 Units (has no administration in time range)  phytonadione  (VITAMIN K ) 10 mg in dextrose  5 % 50 mL IVPB (10 mg Intravenous New Bag/Given 07/28/23 1901)    Mobility walks with device

## 2023-07-29 ENCOUNTER — Observation Stay (HOSPITAL_COMMUNITY)

## 2023-07-29 DIAGNOSIS — F32A Depression, unspecified: Secondary | ICD-10-CM | POA: Diagnosis present

## 2023-07-29 DIAGNOSIS — R9431 Abnormal electrocardiogram [ECG] [EKG]: Secondary | ICD-10-CM | POA: Diagnosis not present

## 2023-07-29 DIAGNOSIS — Z8249 Family history of ischemic heart disease and other diseases of the circulatory system: Secondary | ICD-10-CM | POA: Diagnosis not present

## 2023-07-29 DIAGNOSIS — G9341 Metabolic encephalopathy: Secondary | ICD-10-CM | POA: Diagnosis present

## 2023-07-29 DIAGNOSIS — Z79811 Long term (current) use of aromatase inhibitors: Secondary | ICD-10-CM | POA: Diagnosis not present

## 2023-07-29 DIAGNOSIS — Z170 Estrogen receptor positive status [ER+]: Secondary | ICD-10-CM | POA: Diagnosis not present

## 2023-07-29 DIAGNOSIS — N179 Acute kidney failure, unspecified: Secondary | ICD-10-CM | POA: Diagnosis present

## 2023-07-29 DIAGNOSIS — Z7901 Long term (current) use of anticoagulants: Secondary | ICD-10-CM | POA: Diagnosis not present

## 2023-07-29 DIAGNOSIS — Z79899 Other long term (current) drug therapy: Secondary | ICD-10-CM | POA: Diagnosis not present

## 2023-07-29 DIAGNOSIS — E871 Hypo-osmolality and hyponatremia: Secondary | ICD-10-CM | POA: Insufficient documentation

## 2023-07-29 DIAGNOSIS — W1811XA Fall from or off toilet without subsequent striking against object, initial encounter: Secondary | ICD-10-CM | POA: Diagnosis present

## 2023-07-29 DIAGNOSIS — I4892 Unspecified atrial flutter: Secondary | ICD-10-CM | POA: Diagnosis present

## 2023-07-29 DIAGNOSIS — R55 Syncope and collapse: Secondary | ICD-10-CM | POA: Diagnosis present

## 2023-07-29 DIAGNOSIS — I129 Hypertensive chronic kidney disease with stage 1 through stage 4 chronic kidney disease, or unspecified chronic kidney disease: Secondary | ICD-10-CM | POA: Diagnosis present

## 2023-07-29 DIAGNOSIS — Z66 Do not resuscitate: Secondary | ICD-10-CM | POA: Diagnosis present

## 2023-07-29 DIAGNOSIS — R569 Unspecified convulsions: Secondary | ICD-10-CM | POA: Diagnosis present

## 2023-07-29 DIAGNOSIS — I480 Paroxysmal atrial fibrillation: Secondary | ICD-10-CM | POA: Diagnosis present

## 2023-07-29 DIAGNOSIS — N1832 Chronic kidney disease, stage 3b: Secondary | ICD-10-CM | POA: Diagnosis present

## 2023-07-29 DIAGNOSIS — S0083XA Contusion of other part of head, initial encounter: Secondary | ICD-10-CM | POA: Diagnosis present

## 2023-07-29 DIAGNOSIS — Z880 Allergy status to penicillin: Secondary | ICD-10-CM | POA: Diagnosis not present

## 2023-07-29 DIAGNOSIS — D631 Anemia in chronic kidney disease: Secondary | ICD-10-CM | POA: Diagnosis present

## 2023-07-29 DIAGNOSIS — E782 Mixed hyperlipidemia: Secondary | ICD-10-CM | POA: Diagnosis present

## 2023-07-29 DIAGNOSIS — Z888 Allergy status to other drugs, medicaments and biological substances status: Secondary | ICD-10-CM | POA: Diagnosis not present

## 2023-07-29 DIAGNOSIS — S065XAA Traumatic subdural hemorrhage with loss of consciousness status unknown, initial encounter: Secondary | ICD-10-CM | POA: Diagnosis present

## 2023-07-29 DIAGNOSIS — Y92002 Bathroom of unspecified non-institutional (private) residence single-family (private) house as the place of occurrence of the external cause: Secondary | ICD-10-CM | POA: Diagnosis not present

## 2023-07-29 DIAGNOSIS — C50811 Malignant neoplasm of overlapping sites of right female breast: Secondary | ICD-10-CM | POA: Diagnosis present

## 2023-07-29 LAB — ECHOCARDIOGRAM COMPLETE
Area-P 1/2: 5.66 cm2
Calc EF: 59.2 %
Height: 59 in
MV M vel: 5.11 m/s
MV Peak grad: 104.4 mmHg
MV VTI: 2.23 cm2
Radius: 0.3 cm
S' Lateral: 3.1 cm
Single Plane A2C EF: 58.7 %
Single Plane A4C EF: 57.2 %
Weight: 1888 [oz_av]

## 2023-07-29 LAB — PROTIME-INR
INR: 1.2 (ref 0.8–1.2)
Prothrombin Time: 16.1 s — ABNORMAL HIGH (ref 11.4–15.2)

## 2023-07-29 LAB — COMPREHENSIVE METABOLIC PANEL WITH GFR
ALT: 27 U/L (ref 0–44)
AST: 27 U/L (ref 15–41)
Albumin: 3.8 g/dL (ref 3.5–5.0)
Alkaline Phosphatase: 82 U/L (ref 38–126)
Anion gap: 8 (ref 5–15)
BUN: 35 mg/dL — ABNORMAL HIGH (ref 8–23)
CO2: 24 mmol/L (ref 22–32)
Calcium: 9 mg/dL (ref 8.9–10.3)
Chloride: 99 mmol/L (ref 98–111)
Creatinine, Ser: 1.67 mg/dL — ABNORMAL HIGH (ref 0.44–1.00)
GFR, Estimated: 30 mL/min — ABNORMAL LOW (ref >60)
Glucose, Bld: 101 mg/dL — ABNORMAL HIGH (ref 70–99)
Potassium: 4.3 mmol/L (ref 3.5–5.1)
Sodium: 131 mmol/L — ABNORMAL LOW (ref 135–145)
Total Bilirubin: 0.8 mg/dL (ref 0.0–1.2)
Total Protein: 6.9 g/dL (ref 6.5–8.1)

## 2023-07-29 LAB — URINALYSIS, ROUTINE W REFLEX MICROSCOPIC
Bilirubin Urine: NEGATIVE
Glucose, UA: NEGATIVE mg/dL
Hgb urine dipstick: NEGATIVE
Ketones, ur: NEGATIVE mg/dL
Nitrite: POSITIVE — AB
Protein, ur: NEGATIVE mg/dL
Specific Gravity, Urine: 1.006 (ref 1.005–1.030)
pH: 6 (ref 5.0–8.0)

## 2023-07-29 LAB — PHOSPHORUS: Phosphorus: 4.1 mg/dL (ref 2.5–4.6)

## 2023-07-29 LAB — CBC
HCT: 29.8 % — ABNORMAL LOW (ref 36.0–46.0)
Hemoglobin: 9.4 g/dL — ABNORMAL LOW (ref 12.0–15.0)
MCH: 31 pg (ref 26.0–34.0)
MCHC: 31.5 g/dL (ref 30.0–36.0)
MCV: 98.3 fL (ref 80.0–100.0)
Platelets: 208 K/uL (ref 150–400)
RBC: 3.03 MIL/uL — ABNORMAL LOW (ref 3.87–5.11)
RDW: 13.9 % (ref 11.5–15.5)
WBC: 7.3 K/uL (ref 4.0–10.5)
nRBC: 0 % (ref 0.0–0.2)

## 2023-07-29 LAB — MAGNESIUM: Magnesium: 2.2 mg/dL (ref 1.7–2.4)

## 2023-07-29 LAB — TSH: TSH: 1.558 u[IU]/mL (ref 0.350–4.500)

## 2023-07-29 MED ORDER — MELATONIN 5 MG PO TABS
5.0000 mg | ORAL_TABLET | Freq: Every evening | ORAL | Status: DC | PRN
  Administered 2023-07-29 – 2023-07-30 (×2): 5 mg via ORAL
  Filled 2023-07-29 (×2): qty 1

## 2023-07-29 MED ORDER — LEVETIRACETAM 250 MG PO TABS
250.0000 mg | ORAL_TABLET | Freq: Two times a day (BID) | ORAL | Status: DC
  Administered 2023-07-29 – 2023-07-30 (×3): 250 mg via ORAL
  Filled 2023-07-29 (×4): qty 1

## 2023-07-29 NOTE — Progress Notes (Signed)
  Echocardiogram 2D Echocardiogram has been performed.  Devora Ellouise SAUNDERS 07/29/2023, 9:06 AM

## 2023-07-29 NOTE — [place] FL2 (Signed)
 [place]  MEDICAID FL2 LEVEL OF CARE FORM     IDENTIFICATION  Patient Name: Brandi [name] Birthdate: 1940-10-08 Sex: female Admission Date (Current Location): 07/28/2023  [facility] and [place] Number:  Producer, television/film/video and Address:  [facility],  [address], [place] [address]      Provider Number:    Attending Physician Name and Address:  Jonel Lonni SQUIBB, *  Relative Name and Phone Number:  Melendrez,Craig (Spouse)  [phone] (Mobile)    Current Level of Care: Hospital Recommended Level of Care: Skilled Nursing Facility Prior Approval Number:    Date Approved/Denied:   PASRR Number: [id number]  Discharge Plan: SNF    Current Diagnoses: Patient Active Problem List   Diagnosis Date Noted   Hyponatremia 07/29/2023   Subdural hematoma (HCC) 07/28/2023   Head injury 07/28/2023   Seizure (HCC) 07/28/2023   Fall 07/28/2023   Malignant neoplasm of overlapping sites of right breast in female, estrogen receptor positive (HCC) 05/10/2023   Falls frequently 11/11/2022   Asymptomatic bacteriuria 11/11/2022   Closed intertrochanteric fracture of left femur, initial encounter (HCC) 11/10/2022   Closed traumatic fractures of multiple bones of right hip and pelvis, initial encounter (HCC) 08/25/2022   Chronic kidney disease, stage 3b (HCC) 08/25/2022   Compression fracture of T10 vertebra (HCC) 08/25/2022   Essential hypertension 08/25/2022   Acute renal failure superimposed on stage 3b chronic kidney disease (HCC) 08/14/2021   Hypokalemia 08/14/2021   Generalized weakness 08/14/2021   Acute metabolic encephalopathy 08/14/2021   Paroxysmal atrial fibrillation (HCC) 08/14/2021   Normocytic anemia 08/14/2021   Long term (current) use of anticoagulants 08/13/2021   Secondary hypercoagulable state (HCC) 07/17/2021   Atrial flutter (HCC) 07/11/2021   CAP (community acquired pneumonia) 07/07/2021   Acute respiratory failure with hypoxia (HCC)  07/07/2021   AKI (acute kidney injury) (HCC) 07/07/2021   Troponin level elevated 07/07/2021   Vasovagal episode 06/08/2017   Mixed hyperlipidemia 06/08/2017   Chest pain with moderate risk for cardiac etiology 06/07/2017   Abnormal EKG 06/07/2017   Depression 03/18/2012   Mild obesity 03/18/2012    Orientation RESPIRATION BLADDER Height & Weight     Self, Place  Normal Incontinent, External catheter Weight: 53.5 kg Height:  4' 11 (149.9 cm)  BEHAVIORAL SYMPTOMS/MOOD NEUROLOGICAL BOWEL NUTRITION STATUS    Convulsions/Seizures Continent Diet (regular diet)  AMBULATORY STATUS COMMUNICATION OF NEEDS Skin   Extensive Assist Verbally Other (Comment) (Significant bruising present to forehead, as well as face. Color changing consistent with bruising and yellow bilirubin. Lumped area of swelling present to forehead. Entire forehead and face tender to palpation, no scalp tenderness. [place] eyes present)                       Personal Care Assistance Level of Assistance  Bathing, Feeding, Dressing Bathing Assistance: Maximum assistance Feeding assistance: Maximum assistance Dressing Assistance: Maximum assistance     Functional Limitations Info  Sight, Hearing, Speech Sight Info: Adequate Hearing Info: Adequate Speech Info: Adequate    SPECIAL CARE FACTORS FREQUENCY  PT (By licensed PT), OT (By licensed OT)     PT Frequency: 5x/wk OT Frequency: 5x/wk            Contractures Contractures Info: Not present    Additional Factors Info  Code Status, Allergies, Psychotropic Code Status Info: DNR Allergies Info: Amoxil (Amoxicillin), Celebrex (Celecoxib) Psychotropic Info: Zoloft  75mg  po daily         Current Medications (07/29/2023):  This is the current hospital active medication list Current Facility-Administered Medications  Medication Dose Route Frequency Provider Last Rate Last Admin   0.9 %  sodium chloride  infusion   Intravenous Continuous Amponsah, Prosper M,  MD 100 mL/hr at 07/29/23 1307 Infusion Verify at 07/29/23 1307   acetaminophen  (TYLENOL ) tablet 650 mg  650 mg Oral Q6H PRN Amponsah, Prosper M, MD       Or   acetaminophen  (TYLENOL ) suppository 650 mg  650 mg Rectal Q6H PRN Lou Claretta HERO, MD       anastrozole  (ARIMIDEX ) tablet 1 mg  1 mg Oral Daily Amponsah, Prosper M, MD   1 mg at 07/29/23 1002   artificial tears ophthalmic solution 1 drop  1 drop Both Eyes TID Amponsah, Prosper M, MD   1 drop at 07/29/23 1002   azelastine  (ASTELIN ) 0.1 % nasal spray 2 spray  2 spray Each Nare Daily PRN Lou Claretta HERO, MD       Chlorhexidine  Gluconate Cloth 2 % PADS 6 each  6 each Topical Q2200 Lou Claretta HERO, MD       diltiazem  (CARDIZEM  SR) 12 hr capsule 60 mg  60 mg Oral Q12H Amponsah, Prosper M, MD   60 mg at 07/29/23 1002   ferrous sulfate  tablet 325 mg  325 mg Oral Q breakfast Lou Claretta HERO, MD   325 mg at 07/29/23 1002   levETIRAcetam  (KEPPRA ) tablet 250 mg  250 mg Oral BID Jonel Lonni SQUIBB, MD   250 mg at 07/29/23 1002   LORazepam  (ATIVAN ) injection 1 mg  1 mg Intravenous PRN Lou Claretta HERO, MD       metoprolol  succinate (TOPROL -XL) 24 hr tablet 25 mg  25 mg Oral BID Amponsah, Prosper M, MD   25 mg at 07/29/23 1002   multivitamin with minerals tablet 1 tablet  1 tablet Oral QPM Lou Claretta HERO, MD   1 tablet at 07/28/23 2201   ondansetron  (ZOFRAN ) tablet 4 mg  4 mg Oral Q6H PRN Amponsah, Prosper M, MD       Or   ondansetron  (ZOFRAN ) injection 4 mg  4 mg Intravenous Q6H PRN Amponsah, Prosper M, MD       Oral care mouth rinse  15 mL Mouth Rinse PRN Lou Claretta HERO, MD       pantoprazole  (PROTONIX ) EC tablet 40 mg  40 mg Oral Daily Amponsah, Prosper M, MD   40 mg at 07/29/23 1002   polyethylene glycol (MIRALAX  / GLYCOLAX ) packet 17 g  17 g Oral PRN Lou Claretta HERO, MD       [START ON 08/01/2023] rosuvastatin  (CRESTOR ) tablet 10 mg  10 mg Oral Once per day on Sunday Wednesday Lou Claretta HERO, MD        senna-docusate (Senokot-S) tablet 1 tablet  1 tablet Oral QHS PRN Lou Claretta HERO, MD       sertraline  (ZOLOFT ) tablet 75 mg  75 mg Oral QHS Amponsah, Prosper M, MD   75 mg at 07/28/23 2201     Discharge Medications: Please see discharge summary for a list of discharge medications.  Relevant Imaging Results:  Relevant Lab Results:   Additional Information SSN: [id number]  Alfonse JONELLE Rex, RN
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
## 2023-07-29 NOTE — Progress Notes (Signed)
 Patient ID: Brandi [name], female   DOB: 10/07/1940, 83 y.o.   MRN: [id number]   I reviewed the patient's follow-up head CT.  Her right subdural hematoma is stable.  I will sign off.  Please have her follow-up with me in the office in a week or 2.  Please call if I can be of further assistance.
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
## 2023-07-29 NOTE — Assessment & Plan Note (Addendum)
 At baseline has no cognitive impairment, at presentation she has intermittent disorientation, difficulty following commands consistently, and decreased level of consciousness, due to SDH.  Remains densely encephalopathic. - Hold Keppra  - Repeat CT head obtained and no change - Delirium precautions - Consult Palliative care, this portends a poor prognosis

## 2023-07-29 NOTE — TOC Transition Note (Signed)
 Transition of Care [facility]) - Discharge Note   Patient Details  Name: Brandi [name] MRN: [id number] Date of Birth: 1940/08/29  Transition of Care [facility]) CM/SW Contact:  Alfonse JONELLE Rex, RN Phone Number: 07/29/2023, 3:34 PM   Clinical Narrative:   Call to patient's spouse, Brandi [name] , to introduce role of TOC/NCM and review for dc planning, PT recommendation for short terrm rehab/SNF, spouse agreeable, prefers [facility].  Spouse reports he, patient and their son reside in a private residence, reports they had a dtr who lives in town and son who lives in [place],  states patient was receiving [facility] PT with [facility] but recently discharged, continues to receive [facility] RN services with [facility] , blood draws for INR. Spouse states patient has home DME: RW, wheelchair, Rollator. Spouse states patient does have intermittent issues with her memory. FL2 updated, faxed out for bed offers. TOC will continue to follow.       Barriers to Discharge: Continued Medical Work up   Patient Goals and CMS Choice Patient states their goals for this hospitalization and ongoing recovery are:: per spouse, short term rehab/SNF prior to return home CMS Medicare.gov Compare Post Acute Care list provided to:: Patient Represenative (must comment) ([name],Brandi (Spouse)  [phone] (Mobile)) Choice offered to / list presented to : Spouse Council ownership interest in [facility].provided to:: Spouse    Discharge Placement                       Discharge Plan and Services Additional resources added to the After Visit Summary for                                       Social Drivers of Health (SDOH) Interventions SDOH Screenings   Food Insecurity: Patient Unable To Answer (07/29/2023)  Housing: Unknown (07/29/2023)  Transportation Needs: Patient Unable To Answer (07/29/2023)  Utilities: Patient Unable To Answer (07/29/2023)  Depression (PHQ2-9): Low Risk  (07/28/2023)  Social Connections:  Patient Unable To Answer (07/29/2023)  Tobacco Use: Low Risk  (07/28/2023)     Readmission Risk Interventions    11/12/2022    3:06 PM  Readmission Risk Prevention Plan  Transportation Screening Complete  PCP or Specialist Appt within 3-5 Days Complete  HRI or Home Care Consult Complete  Palliative Care Screening Complete  Medication Review (RN Care Manager) Complete

## 2023-07-29 NOTE — Assessment & Plan Note (Signed)
 Cr baseline 1.3-1.6

## 2023-07-29 NOTE — Assessment & Plan Note (Signed)
 Mild, asymptomatic. - Monitor while on IV fluids

## 2023-07-29 NOTE — Assessment & Plan Note (Addendum)
 Resolved with fluids

## 2023-07-29 NOTE — Progress Notes (Signed)
   07/29/23 1047  TOC Brief Assessment  Insurance and Status Reviewed  Patient has primary care physician Yes  Home environment has been reviewed single family home  Prior level of function: modified independent (uses walker and cane)  Prior/Current Home Services No current home services  Social Drivers of Health Review SDOH reviewed no interventions necessary  Readmission risk has been reviewed Yes  Transition of care needs transition of care needs identified, TOC will continue to follow    PT/OT evaluation pending. TOC will continue to follow for needs.   Heather Saltness, MSW, LCSW 07/29/2023 10:49 AM

## 2023-07-29 NOTE — Assessment & Plan Note (Addendum)
 BP elevated - Continue metoprolol  and diltiazem  - Resume spironolactone  - Hold guanfacine  - Hydralazine  PRN

## 2023-07-29 NOTE — Assessment & Plan Note (Addendum)
 Seizure  Traumatic.  In setting of warfarin.  INR reversed on 7/9 in the ER.  Seizure in the ER, none since.  INR now normal.  Repeat CT head stable but mentation poor.   No further clinical seizures. - Check EEG - Hold EEG given sedation/poor mentation - PT eval - Palliative Care consultation

## 2023-07-29 NOTE — Evaluation (Signed)
 Physical Therapy Evaluation Patient Details Name: Brandi [name] MRN: [id number] DOB: 03/27/1940 Today's Date: 07/29/2023  History of Present Illness  83 YO FEMALE  [facility] a [facility] on 7/5  from passing out and hitting her head.CT head shows 4-5 mm acute right posterior parafalcine subdural hematoma. Patient had a seizure in ED. PMH:  A-fib/a flutter on warfarin, HTN, HLD, venous insufficiency, frequent falls, breast cancer, CKD 3B and depression  Clinical Impression  Pt admitted with above diagnosis.  Pt currently with functional limitations due to the deficits listed below (see PT Problem List). Pt will benefit from acute skilled PT to increase their independence and safety with mobility to allow discharge.     The patient  assisted to [facility] and back to bed with mod support, decreased WB on the RLE, nioted bruising on patella. The patient provided some of her information, no family present to clarify. Patient reports independent and driving  at baseline and resides with spouse and  son. Patient maintained on  4 L SPo2 .95%.   Patient will benefit from continued inpatient follow up therapy, <3 hours/day     If plan is discharge home, recommend the following: A little help with walking and/or transfers;A little help with bathing/dressing/bathroom;Assist for transportation;Assistance with cooking/housework;Help with stairs or ramp for entrance   Can travel by private vehicle        Equipment Recommendations  (TBA)  Recommendations for Other Services       Functional Status Assessment Patient has had a recent decline in their functional status and demonstrates the ability to make significant improvements in function in a reasonable and predictable amount of time.     Precautions / Restrictions Precautions Precautions: Fall Precaution/Restrictions Comments: seizure Restrictions Weight Bearing Restrictions Per Provider Order: No      Mobility   Bed Mobility Overal bed mobility: Needs Assistance Bed Mobility: Supine to Sit     Supine to sit: HOB elevated, Used rails, Mod assist     General bed mobility comments: extra time, multimodal cues    Transfers Overall transfer level: Needs assistance Equipment used: Rolling walker (2 wheels), 2 person hand held assist Transfers: Sit to/from Stand, Bed to chair/wheelchair/BSC Sit to Stand: Mod assist, +2 safety/equipment, +2 physical assistance Stand pivot transfers: Mod assist, +2 safety/equipment Step pivot transfers: Mod assist, +2 safety/equipment       General transfer comment: held RW partially to transfer to [facility], then +2 stand/squat pivot  to bed    Ambulation/Gait                  Stairs            Wheelchair Mobility     Tilt Bed    Modified Rankin (Stroke Patients Only)       Balance Overall balance assessment: Needs assistance, History of Falls Sitting-balance support: Bilateral upper extremity supported, Feet supported Sitting balance-Leahy Scale: Fair     Standing balance support: Single extremity supported, During functional activity Standing balance-Leahy Scale: Poor                               Pertinent Vitals/Pain Pain Assessment Pain Assessment: Faces Faces Pain Scale: Hurts little more Pain Location: face Pain Descriptors / Indicators: Discomfort Pain Intervention(s): Monitored during session    Home Living Family/patient expects to be discharged to:: Private residence Living Arrangements: Spouse/significant other;Children Available Help at Discharge: Family;Available 24  hours/day Type of Home: House Home Access: Stairs to enter Entrance Stairs-Rails: Right;Left Entrance Stairs-Number of Steps: 5 Alternate Level Stairs-Number of Steps: 13 Home Layout: Multi-level;Able to live on main level with bedroom/bathroom Home Equipment: Rolling Walker (2 wheels) Additional Comments: The pt was a questionable  historian therefore information contained here could be verified. She reported living with her son in a 3 story home with her bedroom and bathroom on the main level of the home. She reported using a cane & walker for ambulation and being independent with ADLs. Her medical chart indicates, she recently discharged home from SNF rehab.    Prior Function Prior Level of Function : Patient poor historian/Family not available               ADLs Comments: Ind     Extremity/Trunk Assessment        Lower Extremity Assessment Lower Extremity Assessment: Generalized weakness    Cervical / Trunk Assessment Cervical / Trunk Assessment: Normal  Communication   Communication Communication: Impaired Factors Affecting Communication: Reduced clarity of speech    Cognition Arousal: Alert Behavior During Therapy: WFL for tasks assessed/performed   PT - Cognitive impairments: No family/caregiver present to determine baseline                         Following commands: Intact       Cueing Cueing Techniques: Verbal cues, Tactile cues     General Comments      Exercises     Assessment/Plan    PT Assessment Patient needs continued PT services  PT Problem List Decreased strength;Decreased cognition;Decreased balance;Decreased knowledge of precautions;Decreased mobility;Decreased activity tolerance       PT Treatment Interventions DME instruction;Functional mobility training;Balance training;Patient/family education;Gait training;Therapeutic exercise    PT Goals (Current goals can be found in the Care Plan section)  Acute Rehab PT Goals PT Goal Formulation: Patient unable to participate in goal setting Time For Goal Achievement: 08/12/23 Potential to Achieve Goals: Good    Frequency Min 2X/week     Co-evaluation PT/OT/SLP Co-Evaluation/Treatment: Yes Reason for Co-Treatment: To address functional/ADL transfers;For patient/therapist safety PT goals addressed during  session: Mobility/safety with mobility OT goals addressed during session: ADL's and self-care       AM-PAC PT 6 Clicks Mobility  Outcome Measure Help needed turning from your back to your side while in a flat bed without using bedrails?: A Little Help needed moving from lying on your back to sitting on the side of a flat bed without using bedrails?: A Lot Help needed moving to and from a bed to a chair (including a wheelchair)?: A Lot Help needed standing up from a chair using your arms (e.g., wheelchair or bedside chair)?: A Lot Help needed to walk in hospital room?: Total Help needed climbing 3-5 steps with a railing? : Total 6 Click Score: 11    End of Session   Activity Tolerance: Patient tolerated treatment well;Patient limited by fatigue Patient left: in bed;with bed alarm set;with call bell/phone within reach Nurse Communication: Mobility status PT Visit Diagnosis: Unsteadiness on feet (R26.81);Muscle weakness (generalized) (M62.81);Difficulty in walking, not elsewhere classified (R26.2)    Time: 1114-1140 PT Time Calculation (min) (ACUTE ONLY): 26 min   Charges:   PT Evaluation $PT Eval Low Complexity: 1 Low   PT General Charges $$ ACUTE PT VISIT: 1 Visit         Darice Potters PT Acute Rehabilitation Services Office [phone]   [place],  Namiah Dunnavant 07/29/2023, 1:39 PM

## 2023-07-29 NOTE — Evaluation (Signed)
 Occupational Therapy Evaluation Patient Details Name: Brandi [name] MRN: [id number] DOB: 1940/04/12 Today's Date: 07/29/2023   History of Present Illness   83 yr old female [facility] after having a fall in the bathroom a few days prior; pt apparently passed out and hit her head. CT head shows 4-5 mm acute right posterior parafalcine subdural hematoma. Patient had a seizure in ED. PMH:  a flutter, HTN, HLD, venous insufficiency, frequent falls, breast cancer, CKD 3B and depression     Clinical Impressions The pt is currently presenting with the below listed deficits (see OT problem list). As such, her ADL performance is impaired and she requires assistance for self-care management. During the session, she required mod assist x2 for supine to sit, mod assist x2 to stand-pivot to the bedside commode, and max assist for toileting at bedside commode level. She was further noted to be with general deconditioning, generalized weakness, and impaired mobility. She will benefit from further OT services to maximize her safety and independence with self-care tasks. Patient will benefit from continued inpatient follow up therapy, <3 hours/day.      If plan is discharge home, recommend the following:   Assistance with cooking/housework;Help with stairs or ramp for entrance;Assist for transportation;Direct supervision/assist for medications management;A lot of help with bathing/dressing/bathroom;A lot of help with walking and/or transfers     Functional Status Assessment   Patient has had a recent decline in their functional status and demonstrates the ability to make significant improvements in function in a reasonable and predictable amount of time.     Equipment Recommendations   Other (comment) (to be determined pending progress at next level of care)     Recommendations for Other Services         Precautions/Restrictions   Precautions Precautions:  Fall Precaution/Restrictions Comments: seizure Restrictions Weight Bearing Restrictions Per Provider Order: No     Mobility Bed Mobility Overal bed mobility: Needs Assistance Bed Mobility: Supine to Sit     Supine to sit: HOB elevated, Used rails, Mod assist     General bed mobility comments: extra time, multimodal cues    Transfers Overall transfer level: Needs assistance Equipment used: Rolling walker (2 wheels), 2 person hand held assist Transfers: Sit to/from Stand, Bed to chair/wheelchair/BSC Sit to Stand: Mod assist, +2 safety/equipment, +2 physical assistance Stand pivot transfers: Mod assist, +2 safety/equipment   Step pivot transfers: Mod assist, +2 safety/equipment     General transfer comment: held RW partially to transfer to [facility], then +2 stand pivot back to bed      Balance     Sitting balance-Leahy Scale: Fair       Standing balance-Leahy Scale: Poor         ADL either performed or assessed with clinical judgement   ADL Overall ADL's : Needs assistance/impaired Eating/Feeding: Set up;Sitting   Grooming: Set up;Supervision/safety;Sitting           Upper Body Dressing : Moderate assistance;Sitting   Lower Body Dressing: Maximal assistance;Sitting/lateral leans   Toilet Transfer: Moderate assistance;BSC/3in1;Rolling walker (2 wheels);Stand-pivot;Cueing for sequencing   Toileting- Clothing Manipulation and Hygiene: Maximal assistance;Sit to/from stand;Cueing for safety;Cueing for sequencing                Pertinent Vitals/Pain Pain Assessment Pain Assessment: Faces Faces Pain Scale: Hurts little more Pain Location: face Pain Intervention(s): Monitored during session     Extremity/Trunk Assessment Upper Extremity Assessment Upper Extremity Assessment: RUE deficits/detail;LUE deficits/detail RUE Deficits /  Details: chronic appearing shoulder AROM limitatons, with shoulder flexion being limited to ~75% normal AROM. Elbow and hand  AROM WFL. Grip strength 4-/5 LUE Deficits / Details: chronic appearing shoulder AROM limitatons, with shoulder flexion being limited to ~75% normal AROM. Elbow and hand AROM WFL. Grip strength 4-/5   Lower Extremity Assessment Lower Extremity Assessment: Generalized weakness        Cognition Arousal: Alert Behavior During Therapy: WFL for tasks assessed/performed Cognition: Difficult to assess             OT - Cognition Comments: Oriented to person, month, and situation. She was disoriented to year and place.                 Following commands: Intact                  Home Living Family/patient expects to be discharged to:: Private residence Living Arrangements: Spouse/significant other;Children Available Help at Discharge: Family Type of Home: House Home Access: Stairs to enter Secretary/administrator of Steps: 5 Entrance Stairs-Rails: Right;Left Home Layout: Multi-level;Able to live on main level with bedroom/bathroom;Full bath on main level Alternate Level Stairs-Number of Steps:  (her bedroom and full bathroom on the main level of the home) Alternate Level Stairs-Rails: Can reach both Bathroom Shower/Tub: Producer, television/film/video: Handicapped height     Home Equipment: Shower seat - built in;Rollator (4 wheels);Wheelchair - manual;Cane - single point   Additional Comments: The pt was a questionable historian therefore information contained here could be verified. She reported living with her son and spouse in a 3 story home with her bedroom and bathroom on the main level of the home. She reported using a rollator for ambulation and being independent with ADLs.    Prior Functioning/Environment Prior Level of Function : Independent/Modified Independent             Mobility Comments:  (She reported using a rollator for ambulation.) ADLs Comments:  (The pt reported being independent with ADLs, driving, cooking, and cleaning.)    OT Problem  List: Decreased strength;Decreased activity tolerance;Impaired balance (sitting and/or standing);Decreased cognition;Decreased safety awareness;Pain   OT Treatment/Interventions: Self-care/ADL training;Therapeutic exercise;Therapeutic activities;Energy conservation;Patient/family education;DME and/or AE instruction;Balance training      OT Goals(Current goals can be found in the care plan section)   Acute Rehab OT Goals OT Goal Formulation: With patient Time For Goal Achievement: 08/12/23 Potential to Achieve Goals: Good ADL Goals Pt Will Perform Upper Body Dressing: with set-up;sitting Pt Will Perform Lower Body Dressing: with supervision;sitting/lateral leans Pt Will Transfer to Toilet: with supervision;ambulating Pt Will Perform Toileting - Clothing Manipulation and hygiene: with supervision;sit to/from stand   OT Frequency:  Min 2X/week    Co-evaluation   Reason for Co-Treatment: To address functional/ADL transfers;For patient/therapist safety PT goals addressed during session: Mobility/safety with mobility OT goals addressed during session: ADL's and self-care      AM-PAC OT 6 Clicks Daily Activity     Outcome Measure Help from another person eating meals?: A Little Help from another person taking care of personal grooming?: A Little Help from another person toileting, which includes using toliet, bedpan, or urinal?: A Lot Help from another person bathing (including washing, rinsing, drying)?: A Lot Help from another person to put on and taking off regular upper body clothing?: A Lot Help from another person to put on and taking off regular lower body clothing?: A Lot 6 Click Score: 14   End of Session Equipment Utilized  During Treatment: Gait belt;Rolling walker (2 wheels);Oxygen Nurse Communication: Mobility status  Activity Tolerance: Other (comment) (Fair tolerance) Patient left: in bed;with call bell/phone within reach;with bed alarm set  OT Visit Diagnosis:  Unsteadiness on feet (R26.81);Muscle weakness (generalized) (M62.81);Other abnormalities of gait and mobility (R26.89)                Time: 8880-8857 OT Time Calculation (min): 23 min Charges:  OT General Charges $OT Visit: 1 Visit OT Evaluation $OT Eval Moderate Complexity: 1 Mod    Talen Poser L Hamza Empson, OTR/L 07/29/2023, 2:13 PM

## 2023-07-29 NOTE — Assessment & Plan Note (Signed)
 Rate overall controlled INR down to normal this morning - Continue metoprolol , diltiazem  - Hold warfarin

## 2023-07-29 NOTE — Assessment & Plan Note (Signed)
 Due to CKD, stable within baseline range.

## 2023-07-29 NOTE — Assessment & Plan Note (Signed)
 Continue Crestor

## 2023-07-29 NOTE — Assessment & Plan Note (Signed)
 PT

## 2023-07-29 NOTE — Assessment & Plan Note (Addendum)
 Due to SDH. - Hold Keppra  given sedation - EEG

## 2023-07-29 NOTE — Care Plan (Addendum)
 Spoke with husband by phone

## 2023-07-30 ENCOUNTER — Other Ambulatory Visit: Payer: Self-pay

## 2023-07-30 ENCOUNTER — Inpatient Hospital Stay (HOSPITAL_COMMUNITY)
Admit: 2023-07-30 | Discharge: 2023-07-30 | Disposition: A | Discharge status: Home / Self Care | Attending: Family Medicine | Admitting: Family Medicine

## 2023-07-30 ENCOUNTER — Encounter (HOSPITAL_COMMUNITY): Payer: Self-pay | Admitting: Family Medicine

## 2023-07-30 ENCOUNTER — Inpatient Hospital Stay (HOSPITAL_COMMUNITY)

## 2023-07-30 DIAGNOSIS — S065XAA Traumatic subdural hemorrhage with loss of consciousness status unknown, initial encounter: Secondary | ICD-10-CM | POA: Diagnosis not present

## 2023-07-30 LAB — CBC
HCT: 33 % — ABNORMAL LOW (ref 36.0–46.0)
Hemoglobin: 10.4 g/dL — ABNORMAL LOW (ref 12.0–15.0)
MCH: 31.3 pg (ref 26.0–34.0)
MCHC: 31.5 g/dL (ref 30.0–36.0)
MCV: 99.4 fL (ref 80.0–100.0)
Platelets: 237 K/uL (ref 150–400)
RBC: 3.32 MIL/uL — ABNORMAL LOW (ref 3.87–5.11)
RDW: 13.6 % (ref 11.5–15.5)
WBC: 9.8 K/uL (ref 4.0–10.5)
nRBC: 0 % (ref 0.0–0.2)

## 2023-07-30 LAB — BASIC METABOLIC PANEL WITH GFR
Anion gap: 13 (ref 5–15)
BUN: 24 mg/dL — ABNORMAL HIGH (ref 8–23)
CO2: 20 mmol/L — ABNORMAL LOW (ref 22–32)
Calcium: 9.6 mg/dL (ref 8.9–10.3)
Chloride: 106 mmol/L (ref 98–111)
Creatinine, Ser: 1.52 mg/dL — ABNORMAL HIGH (ref 0.44–1.00)
GFR, Estimated: 34 mL/min — ABNORMAL LOW (ref >60)
Glucose, Bld: 98 mg/dL (ref 70–99)
Potassium: 4.1 mmol/L (ref 3.5–5.1)
Sodium: 139 mmol/L (ref 135–145)

## 2023-07-30 LAB — PROTIME-INR
INR: 1 (ref 0.8–1.2)
Prothrombin Time: 14.3 s (ref 11.4–15.2)

## 2023-07-30 MED ORDER — SPIRONOLACTONE 25 MG PO TABS
25.0000 mg | ORAL_TABLET | Freq: Every day | ORAL | Status: DC
  Administered 2023-07-30 – 2023-07-31 (×2): 25 mg via ORAL
  Filled 2023-07-30 (×2): qty 1

## 2023-07-30 MED ORDER — HYDRALAZINE HCL 25 MG PO TABS
25.0000 mg | ORAL_TABLET | Freq: Four times a day (QID) | ORAL | Status: DC | PRN
  Administered 2023-07-30 – 2023-07-31 (×2): 25 mg via ORAL
  Filled 2023-07-30 (×2): qty 1

## 2023-07-30 NOTE — Progress Notes (Addendum)
 Patient is pulling off cardiac monitor wires, IV line, biting on identification wrist bands, and attempting to get of bed. Heart rate is elevated 160-170 bpm. Upon admission, patient was alert and oriented x 3. At this time, patient is alert to self only; however, she's easily reoriented. On call provider has been notified.

## 2023-07-30 NOTE — Progress Notes (Addendum)
 Patient A&Ox3 and calm now. Vitals stable.

## 2023-07-30 NOTE — Plan of Care (Signed)
  Problem: Education: Goal: Knowledge of General Education information will improve Description: Including pain rating scale, medication(s)/side effects and non-pharmacologic comfort measures Outcome: Progressing   Problem: Health Behavior/Discharge Planning: Goal: Ability to manage health-related needs will improve Outcome: Progressing   Problem: Clinical Measurements: Goal: Ability to maintain clinical measurements within normal limits will improve Outcome: Progressing Goal: Will remain free from infection Outcome: Progressing Goal: Diagnostic test results will improve Outcome: Progressing Goal: Respiratory complications will improve Outcome: Progressing Goal: Cardiovascular complication will be avoided Outcome: Progressing   Problem: Activity: Goal: Risk for activity intolerance will decrease Outcome: Progressing   Problem: Nutrition: Goal: Adequate nutrition will be maintained Outcome: Progressing   Problem: Coping: Goal: Level of anxiety will decrease Outcome: Progressing   Problem: Elimination: Goal: Will not experience complications related to bowel motility Outcome: Progressing Goal: Will not experience complications related to urinary retention Outcome: Progressing   Problem: Pain Managment: Goal: General experience of comfort will improve and/or be controlled Outcome: Progressing   Problem: Safety: Goal: Ability to remain free from injury will improve Outcome: Progressing   Problem: Skin Integrity: Goal: Risk for impaired skin integrity will decrease Outcome: Progressing   Problem: Cardiac: Goal: Ability to achieve and maintain adequate cardiopulmonary perfusion will improve Outcome: Progressing

## 2023-07-30 NOTE — Progress Notes (Signed)
 Routine EEG completed, results pending Neurology review and interpretation
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
## 2023-07-30 NOTE — TOC Progression Note (Signed)
 Transition of Care [facility]) - Progression Note   Patient Details  Name: Yoshika Vensel MRN: [id number] Date of Birth: December 16, 1940  [facility] of [facility]) CM/SW Contact  Duwaine GORMAN Aran, LCSW Phone Number: 07/30/2023, 11:14 AM  Clinical Narrative: Patient received a bed offer from [facility] for rehab, which husband accepted. CSW confirmed bed with Rhonda in admissions. Patient is expected to be here through the weekend. CSW started insurance authorization on [employer] portal. Reference ID # is: [id number]. TOC awaiting insurance authorization.  Expected Discharge Plan: Skilled Nursing Facility Barriers to Discharge: Continued Medical Work up, English as a second language teacher  Expected Discharge Plan and Services Living arrangements for the past 2 months: Single Family Home  Social Determinants of Health (SDOH) Interventions SDOH Screenings   Food Insecurity: No Food Insecurity (07/29/2023)  Housing: Low Risk  (07/29/2023)  Transportation Needs: No Transportation Needs (07/29/2023)  Utilities: Not At Risk (07/29/2023)  Depression (PHQ2-9): Low Risk  (07/28/2023)  Social Connections: Socially Integrated (07/29/2023)  Tobacco Use: Low Risk  (07/30/2023)   Readmission Risk Interventions    07/30/2023   11:14 AM 11/12/2022    3:06 PM  Readmission Risk Prevention Plan  Transportation Screening Complete Complete  PCP or Specialist Appt within 3-5 Days  Complete  HRI or Home Care Consult Complete Complete  Social Work Consult for Recovery Care Planning/Counseling Complete   Palliative Care Screening Not Applicable Complete  Medication Review [employer]) Complete Complete

## 2023-07-31 DIAGNOSIS — R569 Unspecified convulsions: Secondary | ICD-10-CM | POA: Diagnosis not present

## 2023-07-31 DIAGNOSIS — S065XAA Traumatic subdural hemorrhage with loss of consciousness status unknown, initial encounter: Secondary | ICD-10-CM | POA: Diagnosis not present

## 2023-07-31 NOTE — Progress Notes (Signed)
 Attempted to call report to [facility]. No answer, will try again later.

## 2023-07-31 NOTE — Plan of Care (Signed)
  Problem: Education: Goal: Knowledge of General Education information will improve Description: Including pain rating scale, medication(s)/side effects and non-pharmacologic comfort measures Outcome: Progressing   Problem: Health Behavior/Discharge Planning: Goal: Ability to manage health-related needs will improve Outcome: Progressing   Problem: Clinical Measurements: Goal: Ability to maintain clinical measurements within normal limits will improve Outcome: Progressing Goal: Will remain free from infection Outcome: Progressing Goal: Diagnostic test results will improve Outcome: Progressing Goal: Respiratory complications will improve Outcome: Progressing Goal: Cardiovascular complication will be avoided Outcome: Progressing   Problem: Activity: Goal: Risk for activity intolerance will decrease Outcome: Progressing   Problem: Nutrition: Goal: Adequate nutrition will be maintained Outcome: Progressing   Problem: Coping: Goal: Level of anxiety will decrease Outcome: Progressing   Problem: Elimination: Goal: Will not experience complications related to bowel motility Outcome: Progressing Goal: Will not experience complications related to urinary retention Outcome: Progressing   Problem: Pain Managment: Goal: General experience of comfort will improve and/or be controlled Outcome: Progressing   Problem: Safety: Goal: Ability to remain free from injury will improve Outcome: Progressing   Problem: Skin Integrity: Goal: Risk for impaired skin integrity will decrease Outcome: Progressing   Problem: Cardiac: Goal: Ability to achieve and maintain adequate cardiopulmonary perfusion will improve Outcome: Progressing

## 2023-07-31 NOTE — Plan of Care (Signed)

## 2023-07-31 NOTE — Consult Note (Signed)
 Consultation Note Date: 07/31/2023   Patient Name: Brandi [name]  DOB: 1940/09/01  MRN: [id number]  Age / Sex: 83 y.o., female  PCP: Addie Camellia CROME, MD Referring Physician: Jonel Lonni SQUIBB, *  Reason for Consultation: Establishing goals of care  HPI/Patient Profile: 83 y.o. female   admitted on 07/28/2023  .  83 y.o. F with Afib on warfarin, CKD IIIb baseline 1.3-1.6, , HTN, HLD, BrCA who presented after a fall.  Was using the commode, got dizzy, passed out, striking her head on the bathtub.     She had some bruising but appeared okay for a few days until she went to the [facility] for a routine appointment and seemed unsteady and so was sent to the ER for evaluation   In the ER, found to have a 4-66mm intrahemispheric and supratentorial R SDH.  Reversed with Kcentra  and vitamin K  and admitted.  Had 1 seizure in the ER.  Patient also found to have subdural hematoma, warfarin was held, outpatient neurosurgery follow-up, was recently connected with Dr. Lanny from medical oncology. Palliative consult for goals of care   Clinical Assessment and Goals of Care: Patient is awake alert resting in bed.  She has facial bruises and bruise on her forehead.  She does not have any acute distress or discomfort currently.  She is anticipating discharge today.  She has never been to a skilled nursing facility for rehab.  Brief life review also performed patient originally from [facility] [place]  and has lived in [place] most of her adult life.  She lives at home with her husband.  Goals of care discussions undertaken-see below. Palliative medicine is specialized medical care for people living with serious illness. It focuses on providing relief from the symptoms and stress of a serious illness. The goal is to improve quality of life for both the patient and the family. Goals of care: Broad aims of medical therapy in  relation to the patient's values and preferences. Our aim is to provide medical care aimed at enabling patients to achieve the goals that matter most to them, given the circumstances of their particular medical situation and their constraints.    NEXT OF KIN  Spouse and son.   SUMMARY OF RECOMMENDATIONS   Recommend outpatient palliative care.  Will inform palliative colleague Ms. Cousar, NP about the patient on 08-02-2023.  Advise outpatient palliative follow-up at the cancer center as the patient follows with Dr. Lanny for her recently diagnosed breast malignancy.  Otherwise, agree with current rehab attempt.  No new inpatient palliative specific recommendations.  CODE STATUS is DNR. Thank you for the consult.   Code Status/Advance Care Planning: DNR   Symptom Management:     Palliative Prophylaxis:  Delirium Protocol    Psycho-social/Spiritual:  Desire for further Chaplaincy support:yes Additional Recommendations: Caregiving  Support/Resources  Prognosis:  Unable to determine  Discharge Planning: Skilled Nursing Facility for rehab with Palliative care service follow-up      Primary Diagnoses: Present on Admission:  Subdural hematoma (HCC)  Acute renal failure superimposed on stage 3b chronic kidney disease (HCC)  Acute metabolic encephalopathy  Paroxysmal atrial fibrillation (HCC)  Chronic kidney disease, stage 3b (HCC)  Mixed hyperlipidemia  Essential hypertension  Normocytic anemia   I have reviewed the medical record, interviewed the patient and family, and examined the patient. The following aspects are pertinent.  Past Medical History:  Diagnosis Date   Allergy    Atrial fibrillation (HCC)    Dysrhythmia    Hyperlipidemia    Hypertension    Neuromuscular disorder (HCC)    Venous insufficiency    Social History   Socioeconomic History   Marital status: Married    Spouse name: Not on file   Number of children: 3   Years of education: Not on file    Highest education level: Not on file  Occupational History   Not on file  Tobacco Use   Smoking status: Never    Passive exposure: Never   Smokeless tobacco: Never   Tobacco comments:    Never smoke 07/17/21  Vaping Use   Vaping status: Never Used  Substance and Sexual Activity   Alcohol  use: Never   Drug use: Never   Sexual activity: Not Currently  Other Topics Concern   Not on file  Social History Narrative   Not on file   Social Drivers of Health   Financial Resource Strain: Not on file  Food Insecurity: No Food Insecurity (07/29/2023)   Hunger Vital Sign    Worried About Running Out of Food in the Last Year: Never true    Ran Out of Food in the Last Year: Never true  Transportation Needs: No Transportation Needs (07/29/2023)   PRAPARE - Administrator, Civil Service (Medical): No    Lack of Transportation (Non-Medical): No  Physical Activity: Not on file  Stress: Not on file  Social Connections: Socially Integrated (07/29/2023)   Social Connection and Isolation Panel    Frequency of Communication with Friends and Family: More than three times a week    Frequency of Social Gatherings with Friends and Family: Patient unable to answer    Attends Religious Services: 1 to 4 times per year    Active Member of [employer] or Organizations: Yes    Attends Banker Meetings: Never    Marital Status: Married   Family History  Problem Relation Age of Onset   Heart attack Father    Scheduled Meds:  anastrozole   1 mg Oral Daily   artificial tears  1 drop Both Eyes TID   Chlorhexidine  Gluconate Cloth  6 each Topical Q2200   diltiazem   60 mg Oral Q12H   ferrous sulfate   325 mg Oral Q breakfast   metoprolol  succinate  25 mg Oral BID   multivitamin with minerals  1 tablet Oral QPM   pantoprazole   40 mg Oral Daily   [START ON 08/01/2023] rosuvastatin   10 mg Oral Once per day on Sunday Wednesday   sertraline   75 mg Oral QHS   spironolactone   25 mg Oral Daily    Continuous Infusions: PRN Meds:.acetaminophen  **OR** acetaminophen , azelastine , hydrALAZINE , melatonin, ondansetron  **OR** ondansetron  (ZOFRAN ) IV, mouth rinse, polyethylene glycol, senna-docusate Medications Prior to Admission:  Prior to Admission medications   Medication Sig Start Date End Date Taking? Authorizing Provider  acetaminophen  (TYLENOL ) 500 MG tablet Take 1,000 mg by mouth every 6 (six) hours as needed for mild pain.   Yes [provider]  anastrozole  (ARIMIDEX ) 1 MG tablet Take  1 tablet (1 mg total) by mouth daily. 05/12/23  Yes Lanny Callander, MD  azelastine  (ASTELIN ) 0.1 % nasal spray Place 2 sprays into both nostrils daily as needed for rhinitis or allergies. 06/07/19  Yes [provider]  cetirizine (ZYRTEC) 10 MG tablet Take 10 mg by mouth daily as needed for allergies.   Yes [provider]  clotrimazole-betamethasone (LOTRISONE) cream Apply 1 Application topically as needed (rash). 01/05/23  Yes [provider]  diltiazem  (CARDIZEM  SR) 60 MG 12 hr capsule Take 60 mg by mouth every 12 (twelve) hours.   Yes [provider]  ferrous sulfate  325 (65 FE) MG tablet Take 325 mg by mouth daily with breakfast.   Yes [provider]  guanFACINE  (TENEX ) 2 MG tablet Take 2 mg by mouth every evening. 09/19/21  Yes [provider]  metoprolol  succinate (TOPROL -XL) 25 MG 24 hr tablet Take 25 mg by mouth 2 (two) times daily. 07/05/23  Yes [provider]  Multiple Vitamins-Minerals (CENTRUM SILVER 50+WOMEN) TABS Take 1 tablet by mouth every evening.   Yes [provider]  pantoprazole  (PROTONIX ) 40 MG tablet Take 1 tablet (40 mg total) by mouth daily. 07/12/21  Yes Singh, Prashant K, MD  polyethylene glycol (MIRALAX  / GLYCOLAX ) 17 g packet Take 17 g by mouth daily. Patient taking differently: Take 17 g by mouth daily as needed for moderate constipation. 11/17/22  Yes Raenelle Coria, MD  rosuvastatin  (CRESTOR ) 10 MG  tablet Take 10 mg by mouth See admin instructions. Take 10 mg by mouth at bedtime on Sun/Wed only (twice a week) 10/10/19  Yes [provider]  senna-docusate (SENOKOT-S) 8.6-50 MG tablet Take 1 tablet by mouth at bedtime as needed for mild constipation. 08/30/22  Yes Lue Elsie BROCKS, MD  sertraline  (ZOLOFT ) 50 MG tablet Take 75 mg by mouth at bedtime.   Yes [provider]  spironolactone  (ALDACTONE ) 25 MG tablet Take 25 mg by mouth daily. 04/15/23  Yes [provider]  SYSTANE COMPLETE PF 0.6 % SOLN Place 1 drop into both eyes in the morning, at noon, and at bedtime.   Yes [provider]  warfarin (JANTOVEN ) 2 MG tablet TAKE 1/2 TO 1 TABLET (1 TO 2MG ) DAILY OR AS DIRECTED  BY ANTICOAGULATION CLINIC 03/26/23  Yes Gollan, Timothy J, MD  Lidocaine  HCl (ASPERCREME LIDOCAINE ) 4 % LIQD Apply 1 Application topically 2 (two) times daily as needed (knee pain).    [provider]   Allergies  Allergen Reactions   Amoxil [Amoxicillin] Diarrhea   Celebrex [Celecoxib] Other (See Comments)    Bruising and stomach pain   Keppra  [Levetiracetam ] Other (See Comments)    Somnolence   Review of Systems + facial pain from bruises.   Physical Exam Awake alert Has facial bruises and forehead bruise Regular work of breathing No distress  Vital Signs: BP (!) 129/94 (BP Location: Left Arm)   Pulse 91   Temp 98.1 F (36.7 C) (Oral)   Resp 17   Ht 4' 11 (1.499 m)   Wt 53.5 kg   SpO2 98%   BMI 23.83 kg/m  Pain Scale: 0-10   Pain Score: 2    SpO2: SpO2: 98 % O2 Device:SpO2: 98 % O2 Flow Rate: .O2 Flow Rate (L/min): 2 L/min  IO: Intake/output summary:  Intake/Output Summary (Last 24 hours) at 07/31/2023 1245 Last data filed at 07/31/2023 0522 Gross per 24 hour  Intake 120 ml  Output 750 ml  Net -630 ml  LBM: Last BM Date : 07/29/23 Baseline Weight: Weight: 53.5 kg Most recent weight: Weight: 53.5 kg     Palliative Assessment/Data:   PPS  50%  Time In:  11 Time Out:  12 Time Total:  60  Greater than 50%  of this time was spent counseling and coordinating care related to the above assessment and plan.  Signed by: Lonia Serve, MD   Please contact Palliative Medicine Team phone at [phone] for questions and concerns.  For individual provider: See Tracey

## 2023-07-31 NOTE — Progress Notes (Signed)
 Report called and given to admitting nurse at [facility].

## 2023-07-31 NOTE — Procedures (Signed)
 Patient Name: Brandi [name]  MRN: [id number]  Epilepsy Attending: Arlin MALVA Krebs  Referring Physician/Provider: Jonel Lonni SQUIBB, MD  Date: 07/30/2023 Duration: 28.27 mins  Patient history: 83yo F with SDH. EEG to evaluate for seizure  Level of alertness: Awake, asleep  AEDs during EEG study: None  Technical aspects: This EEG study was done with scalp electrodes positioned according to the 10-20 International system of electrode placement. Electrical activity was reviewed with band pass filter of 1-70Hz , sensitivity of 7 uV/mm, display speed of 81mm/sec with a 60Hz  notched filter applied as appropriate. EEG data were recorded continuously and digitally stored.  Video monitoring was available and reviewed as appropriate.  Description: The posterior dominant rhythm consists of 8 Hz activity of moderate voltage (25-35 uV) seen predominantly in posterior head regions, symmetric and reactive to eye opening and eye closing. Sleep was characterized by vertex waves, sleep spindles (12 to 14 Hz), maximal frontocentral region.  EEG showed continuous 3 to 6 Hz theta-delta slowing in right temporal region. Intermittent generalized 3 to 6 Hz theta-delta slowing was also noted. Hyperventilation and photic stimulation were not performed.     ABNORMALITY - Continuous slow, right temporal region - Intermittent slow, generalized  IMPRESSION: This study is suggestive of cortical dysfunction arising from right temporal region likely secondary to underlying structural abnormality/ subdural hematoma. Additionally there is mild diffuse encephalopathy. No seizures or epileptiform discharges were seen throughout the recording.  Emrys Mceachron O Erica Osuna

## 2023-07-31 NOTE — Discharge Summary (Signed)
 Physician Discharge Summary   Patient: Brandi [name] MRN: [id number] DOB: May 08, 1940  Admit date:     07/28/2023  Discharge date: 07/31/23  Discharge Physician: Lonni SHAUNNA Dalton   PCP: Addie Camellia CROME, MD     Recommendations at discharge:  Follow up with PCP Dr. Addie within 1 week of discharge from SNF Follow up with Dr. Mavis Neurosurgery in 1-2 weeks Hold warfarin for now, resume when cleared by Neurosurgery     Discharge Diagnoses: Principal Problem:   Subdural hematoma (HCC) Active Problems:   Acute metabolic encephalopathy   Mixed hyperlipidemia   Acute renal failure superimposed on stage 3b chronic kidney disease (HCC)   Paroxysmal atrial fibrillation (HCC)   Normocytic anemia   Chronic kidney disease, stage 3b (HCC)   Essential hypertension   Malignant neoplasm of overlapping sites of right breast in female, estrogen receptor positive (HCC)   Seizure (HCC)   Fall   Hyponatremia      Hospital Course: 83 y.o. F with Afib on warfarin, CKD IIIb baseline 1.3-1.6, , HTN, HLD, BrCA who presented after a fall.  Was using the commode, got dizzy, passed out, striking her head on the bathtub.    She had some bruising but appeared okay for a few days until she went to the [facility] for a routine appointment and seemed unsteady and so was sent to the ER for evaluation  In the ER, found to have a 4-42mm intrahemispheric and supratentorial R SDH.  Reversed with Kcentra  and vitamin K  and admitted.  Had 1 seizure in the ER.      Subdural hematoma (HCC) Traumatic.  In setting of warfarin.  INR reversed on 7/9 in the ER.    INR now normal.  NSG consulted, repeat CT head showed no change.  Neurosurgery recommended outpatient follow up.  Hold warfarin for 2 weeks, then resume if cleared by Neurosurgery.        Seizure (HCC) Occurred in the ER.  Due to SDH.    No further seizures in the hospital.  Was initally placed on Keppra , but this worsened  delirium/somnolence and so was stopped.  EEG after cessation showed no signs of seizure activity, and mentation normalized.     Acute renal failure superimposed on stage 3b chronic kidney disease (HCC) Cr 1.93 at admission, up from baseline 1.3-1.6.  Given fluids and rseolved to baseline    Paroxysmal atrial fibrillation (HCC) Rate controlled on metoprolol , diltiazem .    Hold warfarin for now, resume if cleared by Neurosurgery.      Acute metabolic encephalopathy At baseline has no cognitive impairment, at presentation was intermittently disoriented, somnolent and having difficulty following commands.  Due to [facility], likely concussion.  Improved with holding Keppra  and time.  Chronic kidney disease, stage 3b (HCC) Cr baseline 1.3-1.6   Normocytic anemia Due to CKD, stable within baseline range.   Hyponatremia Mild, asymptomatic.   Fall PT recommend SNF   Malignant neoplasm of overlapping sites of right breast in female, estrogen receptor positive (HCC)     Essential hypertension Continue home metoprolol  and diltiazem , spironolactone  and guanfacine    Mixed hyperlipidemia On Crestor              The   Controlled Substances Registry was reviewed for this patient prior to discharge.   Consultants: Neurosurgery Procedures performed: CT head  Disposition: Skilled nursing facility Diet recommendation: Cardiac   DISCHARGE MEDICATION: Allergies as of 07/31/2023       Reactions   Amoxil [  amoxicillin] Diarrhea   Celebrex [celecoxib] Other (See Comments)   Bruising and stomach pain        Medication List     PAUSE taking these medications    warfarin 2 MG tablet Wait to take this until your doctor or other care provider tells you to start again. Commonly known as: Jantoven  Take as directed. If you are unsure how to take this medication, talk to your nurse or doctor. Original instructions: TAKE 1/2 TO 1 TABLET (1 TO 2MG ) DAILY OR AS DIRECTED   BY ANTICOAGULATION CLINIC       TAKE these medications    acetaminophen  500 MG tablet Commonly known as: TYLENOL  Take 1,000 mg by mouth every 6 (six) hours as needed for mild pain.   anastrozole  1 MG tablet Commonly known as: ARIMIDEX  Take 1 tablet (1 mg total) by mouth daily.   Aspercreme Lidocaine  4 % Liqd Generic drug: Lidocaine  HCl Apply 1 Application topically 2 (two) times daily as needed (knee pain).   azelastine  0.1 % nasal spray Commonly known as: ASTELIN  Place 2 sprays into both nostrils daily as needed for rhinitis or allergies.   Centrum Silver 50+Women Tabs Take 1 tablet by mouth every evening.   cetirizine 10 MG tablet Commonly known as: ZYRTEC Take 10 mg by mouth daily as needed for allergies.   clotrimazole-betamethasone cream Commonly known as: LOTRISONE Apply 1 Application topically as needed (rash).   diltiazem  60 MG 12 hr capsule Commonly known as: CARDIZEM  SR Take 60 mg by mouth every 12 (twelve) hours.   ferrous sulfate  325 (65 FE) MG tablet Take 325 mg by mouth daily with breakfast.   guanFACINE  2 MG tablet Commonly known as: TENEX  Take 2 mg by mouth every evening.   metoprolol  succinate 25 MG 24 hr tablet Commonly known as: TOPROL -XL Take 25 mg by mouth 2 (two) times daily.   pantoprazole  40 MG tablet Commonly known as: PROTONIX  Take 1 tablet (40 mg total) by mouth daily.   polyethylene glycol 17 g packet Commonly known as: MIRALAX  / GLYCOLAX  Take 17 g by mouth daily. What changed:  when to take this reasons to take this   rosuvastatin  10 MG tablet Commonly known as: CRESTOR  Take 10 mg by mouth See admin instructions. Take 10 mg by mouth at bedtime on Sun/Wed only (twice a week)   senna-docusate 8.6-50 MG tablet Commonly known as: Senokot-S Take 1 tablet by mouth at bedtime as needed for mild constipation.   sertraline  50 MG tablet Commonly known as: ZOLOFT  Take 75 mg by mouth at bedtime.   spironolactone  25 MG  tablet Commonly known as: ALDACTONE  Take 25 mg by mouth daily.   Systane Complete PF 0.6 % Soln Generic drug: Propylene Glycol (PF) Place 1 drop into both eyes in the morning, at noon, and at bedtime.               Discharge Care Instructions  (From admission, onward)           Start     Ordered   07/31/23 0000  Discharge wound care:       Comments: Wash forehead gently with soap and water , pat dry. No dressing needed.   07/31/23 1129            Contact information for follow-up providers     Addie Camellia CROME, MD Follow up.   Specialty: Internal Medicine Contact information: [address] [place] [address] [phone]  Contact information for after-discharge care     Destination     [facility]   Service: Skilled Nursing Contact information: [address] [place]  [phone]                     Discharge Instructions     Discharge wound care:   Complete by: As directed    Wash forehead gently with soap and water , pat dry. No dressing needed.   Increase activity slowly   Complete by: As directed        Discharge Exam: Filed Weights   07/28/23 1500  Weight: 53.5 kg    General: Pt is alert, awake, not in acute distress, eating breakfast Cardiovascular: RRR, nl S1-S2, no murmurs appreciated.   No LE edema.   Respiratory: Normal respiratory rate and rhythm.  CTAB without rales or wheezes. Abdominal: Abdomen soft and non-tender.  No distension or HSM.   Neuro/Psych: Strength symmetric in upper and lower extremities, generalized weakness.  Judgment and insight appear impaired, with psychomotor slowing short term memory loss, but improved from prior, oriented to self, place, situation.   Condition at discharge: fair  The results of significant diagnostics from this hospitalization (including imaging, microbiology, ancillary and laboratory)  are listed below for reference.   Imaging Studies: EEG adult Result Date: 07/31/2023 Shelton Arlin KIDD, MD     07/31/2023  7:02 AM Patient Name: Erma Raiche MRN: [id number] Epilepsy Attending: Arlin KIDD Shelton Referring Physician/Provider: Jonel Lonni SQUIBB, MD Date: 07/30/2023 Duration: 28.27 mins Patient history: 83yo F with SDH. EEG to evaluate for seizure Level of alertness: Awake, asleep AEDs during EEG study: None Technical aspects: This EEG study was done with scalp electrodes positioned according to the 10-20 International system of electrode placement. Electrical activity was reviewed with band pass filter of 1-70Hz , sensitivity of 7 uV/mm, display speed of 1mm/sec with a 60Hz  notched filter applied as appropriate. EEG data were recorded continuously and digitally stored.  Video monitoring was available and reviewed as appropriate. Description: The posterior dominant rhythm consists of 8 Hz activity of moderate voltage (25-35 uV) seen predominantly in posterior head regions, symmetric and reactive to eye opening and eye closing. Sleep was characterized by vertex waves, sleep spindles (12 to 14 Hz), maximal frontocentral region.  EEG showed continuous 3 to 6 Hz theta-delta slowing in right temporal region. Intermittent generalized 3 to 6 Hz theta-delta slowing was also noted. Hyperventilation and photic stimulation were not performed.   ABNORMALITY - Continuous slow, right temporal region - Intermittent slow, generalized IMPRESSION: This study is suggestive of cortical dysfunction arising from right temporal region likely secondary to underlying structural abnormality/ subdural hematoma. Additionally there is mild diffuse encephalopathy. No seizures or epileptiform discharges were seen throughout the recording. Priyanka KIDD Shelton   CT HEAD WO CONTRAST ( ) Result Date: 07/30/2023 EXAM: CT HEAD WITHOUT 07/30/2023 11:44:00 AM TECHNIQUE: CT of the head was performed without the administration of  intravenous contrast. Automated exposure control, iterative reconstruction, and/or weight based adjustment of the mA/kV was utilized to reduce the radiation dose to as low as reasonably achievable. COMPARISON: CT head 07/28/2023 CLINICAL HISTORY: Mental status change, unknown cause. Mental status change; pulling off cardiac monitor wires, IV line, biting on identification wrist bands, and attempting to get out of bed. Heart rate is elevated 160-170 bpm. Upon admission, patient was alert and oriented x 3. At this time, patient is alert to self only; however, she's easily reoriented. FINDINGS:  BRAIN AND VENTRICLES: Unchanged acute right subtemporal subdural hematoma layering along the tentorium with slight extension along the posterior falx, measuring up to 5 mm on coronal image 43 series 4. No new acute intracranial hemorrhage. Stable background of moderate chronic small vessel disease and generalized volume loss. ORBITS: No acute abnormality. SINUSES AND MASTOIDS: Unchanged chronic right maxillary sinusitis. SOFT TISSUES AND SKULL: No acute skull fracture. No acute soft tissue abnormality. IMPRESSION: 1. Unchanged acute right subtemporal subdural hematoma layering along the tentorium with slight extension along the posterior falx, measuring up to 5 mm. No new acute intracranial hemorrhage. 2. Stable background of moderate chronic small vessel disease and generalized volume loss. 3. Unchanged chronic right maxillary sinusitis. Electronically signed by: Ryan Chess MD 07/30/2023 12:47 PM EDT RP Workstation: [id number]   ECHOCARDIOGRAM COMPLETE Result Date: 07/29/2023    ECHOCARDIOGRAM REPORT   Patient Name:   Marlow RICE Swart Date of Exam: 07/29/2023 Medical Rec #:  [id number]         Height:       59.0 in Accession #:    [id number]        Weight:       118.0 lb Date of Birth:  05-14-1940          BSA:          1.474 m Patient Age:    83 years          BP:           138/84 mmHg Patient Gender: F                  HR:           110 bpm. Exam Location:  Inpatient Procedure: 2D Echo, Cardiac Doppler and Color Doppler (Both Spectral and Color            Flow Doppler were utilized during procedure). Indications:    R55 Syncope; R94.31 Abnormal EKG  History:        Patient has prior history of Echocardiogram examinations, most                 recent 07/09/2021. Abnormal ECG, Arrythmias:Atrial Fibrillation                 and Atrial Flutter, Signs/Symptoms:Chest Pain, Dyspnea and                 Shortness of Breath; Risk Factors:Dyslipidemia and Hypertension.  Sonographer:    Ellouise Mose RDCS Referring Phys: [id number] CLARETTA HERO [facility]  Sonographer Comments: Technically difficult study due to poor echo windows. Patient moving during exam, rolling towards right side of bed. IMPRESSIONS  1. Left ventricular ejection fraction, by estimation, is 60 to 65%. The left ventricle has normal function. The left ventricle has no regional wall motion abnormalities. Left ventricular diastolic parameters are indeterminate.  2. Right ventricular systolic function is normal. The right ventricular size is mildly enlarged. There is moderately elevated pulmonary artery systolic pressure. The estimated right ventricular systolic pressure is 52.5 mmHg.  3. Left atrial size was moderately dilated.  4. Right atrial size was moderately dilated.  5. The mitral valve is degenerative. Moderate mitral valve regurgitation. No evidence of mitral stenosis.  6. Tricuspid valve regurgitation is moderate.  7. The aortic valve is tricuspid. Aortic valve regurgitation is trivial. No aortic stenosis is present.  8. The inferior vena cava is dilated in size with <50% respiratory variability, suggesting right atrial pressure of 15 mmHg. FINDINGS  Left Ventricle:  Left ventricular ejection fraction, by estimation, is 60 to 65%. The left ventricle has normal function. The left ventricle has no regional wall motion abnormalities. The left ventricular internal cavity size was  normal in size. There is  borderline left ventricular hypertrophy. Left ventricular diastolic function could not be evaluated due to atrial fibrillation. Left ventricular diastolic parameters are indeterminate. Right Ventricle: The right ventricular size is mildly enlarged. No increase in right ventricular wall thickness. Right ventricular systolic function is normal. There is moderately elevated pulmonary artery systolic pressure. The tricuspid regurgitant velocity is 3.06 m/s, and with an assumed right atrial pressure of 15 mmHg, the estimated right ventricular systolic pressure is 52.5 mmHg. Left Atrium: Left atrial size was moderately dilated. Right Atrium: Right atrial size was moderately dilated. Pericardium: There is no evidence of pericardial effusion. Mitral Valve: The mitral valve is degenerative in appearance. Mild mitral annular calcification. Moderate mitral valve regurgitation. No evidence of mitral valve stenosis. MV peak gradient, 7.1 mmHg. The mean mitral valve gradient is 2.0 mmHg. Tricuspid Valve: The tricuspid valve is normal in structure. Tricuspid valve regurgitation is moderate . No evidence of tricuspid stenosis. Aortic Valve: The aortic valve is tricuspid. Aortic valve regurgitation is trivial. No aortic stenosis is present. Pulmonic Valve: The pulmonic valve was normal in structure. Pulmonic valve regurgitation is not visualized. No evidence of pulmonic stenosis. Aorta: The aortic root and ascending aorta are structurally normal, with no evidence of dilitation. Venous: The inferior vena cava is dilated in size with less than 50% respiratory variability, suggesting right atrial pressure of 15 mmHg. IAS/Shunts: No atrial level shunt detected by color flow Doppler.  LEFT VENTRICLE PLAX 2D LVIDd:         4.60 cm LVIDs:         3.10 cm LV PW:         0.70 cm LV IVS:        0.90 cm LVOT diam:     2.00 cm LV SV:         60 LV SV Index:   41 LVOT Area:     3.14 cm  LV Volumes (MOD) LV vol d, MOD  A2C: 60.5 ml LV vol d, MOD A4C: 45.6 ml LV vol s, MOD A2C: 25.0 ml LV vol s, MOD A4C: 19.5 ml LV SV MOD A2C:     35.5 ml LV SV MOD A4C:     45.6 ml LV SV MOD BP:      33.4 ml RIGHT VENTRICLE             IVC RV S prime:     13.20 cm/s  IVC diam: 2.40 cm TAPSE (M-mode): 1.7 cm LEFT ATRIUM             Index        RIGHT ATRIUM           Index LA diam:        4.50 cm 3.05 cm/m   RA Area:     16.00 cm LA Vol (A2C):   77.2 ml 52.38 ml/m  RA Volume:   37.90 ml  25.71 ml/m LA Vol (A4C):   44.7 ml 30.33 ml/m LA Biplane Vol: 63.0 ml 42.74 ml/m  AORTIC VALVE LVOT Vmax:   98.30 cm/s LVOT Vmean:  65.800 cm/s LVOT VTI:    0.191 m  AORTA Ao Root diam: 2.90 cm Ao Asc diam:  3.70 cm MITRAL VALVE  TRICUSPID VALVE MV Area (PHT): 5.66 cm       TR Peak grad:   37.5 mmHg MV Area VTI:   2.23 cm       TR Vmax:        306.00 cm/s MV Peak grad:  7.1 mmHg MV Mean grad:  2.0 mmHg       SHUNTS MV Vmax:       1.33 m/s       Systemic VTI:  0.19 m MV Vmean:      66.8 cm/s      Systemic Diam: 2.00 cm MV Decel Time: 134 msec MR Peak grad:    104.4 mmHg MR Mean grad:    72.0 mmHg MR Vmax:         511.00 cm/s MR Vmean:        398.0 cm/s MR PISA:         0.57 cm MR PISA Eff ROA: 4 mm MR PISA Radius:  0.30 cm MV E velocity: 101.00 cm/s Toribio Fuel MD Electronically signed by Toribio Fuel MD Signature Date/Time: 07/29/2023/8:20:38 PM    Final    CT HEAD WO CONTRAST ( ) Result Date: 07/28/2023 CLINICAL DATA:  Subdural hematoma EXAM: CT HEAD WITHOUT CONTRAST TECHNIQUE: Contiguous axial images were obtained from the base of the skull through the vertex without intravenous contrast. RADIATION DOSE REDUCTION: This exam was performed according to the departmental dose-optimization program which includes automated exposure control, adjustment of the mA and/or kV according to patient size and/or use of iterative reconstruction technique. COMPARISON:  CT of the head dated July 28, 2023. FINDINGS: Brain: The patient's right  parafalcine and supratentorial subdural hematoma has remained stable in the interim, measuring up to approximately 4 mm in thickness, as before. There is no significant mass effect upon the brain parenchyma. There is moderate generalized cerebral volume loss and moderate periventricular white matter disease. Vascular: Mild calcific atheromatous disease. Skull: Intact and unremarkable. There is soft tissue swelling of the forehead to the left of midline. Sinuses/Orbits: Complete opacification of the right maxillary sinus and moderate opacification of the right ethmoid air cells. Status post bilateral lens replacement. Interval development of multiple small bubbles of air within the right orbit, periorbital soft tissues and cavernous sinuses, likely iatrogenic. No facial fractures evident. Other: Interval development of multiple bubbles of air within the right orbit, cavernous sinus and soft tissues. IMPRESSION: 1. Stable right parafalcine and supratentorial subdural hematoma. 2. Age-related atrophy and mild to moderate cerebral white matter disease. 3. Numerous bubbles of air are now present, likely iatrogenic. Electronically Signed   By: Evalene Coho M.D.   On: 07/28/2023 20:20   DG Chest 1 View Result Date: 07/28/2023 CLINICAL DATA:  Fall. EXAM: CHEST  1 VIEW COMPARISON:  November 10, 2022. FINDINGS: Mild cardiomegaly. Both lungs are clear. The visualized skeletal structures are unremarkable. IMPRESSION: No active disease. Electronically Signed   By: Lynwood Landy Raddle M.D.   On: 07/28/2023 17:33   DG Pelvis 1-2 Views Result Date: 07/28/2023 CLINICAL DATA:  Fall. EXAM: PELVIS - 1-2 VIEW COMPARISON:  August 25, 2022. FINDINGS: Status post surgical internal fixation of old proximal left femoral fracture. Old right inferior pubic ramus fracture is noted. No acute fracture or dislocation is noted. IMPRESSION: No acute abnormality seen. Electronically Signed   By: Lynwood Landy Raddle M.D.   On: 07/28/2023 17:32   CT  Head Wo Contrast Result Date: 07/28/2023 CLINICAL DATA:  Head trauma, moderate-severe. Fall, racoon eyes present, on  warfarin; Facial trauma, blunt EXAM: CT HEAD WITHOUT CONTRAST CT MAXILLOFACIAL WITHOUT CONTRAST CT CERVICAL SPINE WITHOUT CONTRAST TECHNIQUE: Multidetector CT imaging of the head, cervical spine, and maxillofacial structures were performed using the standard protocol without intravenous contrast. Multiplanar CT image reconstructions of the cervical spine and maxillofacial structures were also generated. RADIATION DOSE REDUCTION: This exam was performed according to the departmental dose-optimization program which includes automated exposure control, adjustment of the mA and/or kV according to patient size and/or use of iterative reconstruction technique. COMPARISON:  11/10/2022 FINDINGS: CT HEAD FINDINGS Brain: Acute right posterior parafalcine subdural hematoma is seen layering along the right tentorium extending into the right middle cranial fossa, measuring up to 4-5 mm in thickness (15/2, 23/6). No subarachnoid, intraparenchymal, or intraventricular hemorrhage identified. No abnormal mass effect or midline shift. Moderate parenchymal volume loss is commensurate with the patient's age. Mild periventricular white matter changes are present likely reflecting the sequela of small vessel ischemia. Ventricular size is normal. Cerebellum is unremarkable. Vascular: No hyperdense vessel or unexpected calcification. Skull: Normal. Negative for fracture or focal lesion. Other: Mastoid air cells and middle ear cavities are clear. Small frontal scalp hematoma. CT MAXILLOFACIAL FINDINGS Osseous: No fracture or mandibular dislocation. No destructive process. Orbits: Negative. No traumatic or inflammatory finding. Ocular lenses of been removed. Sinuses: There is again noted dense opacification of the right maxillary sinus, partially visualized on head CT 11/10/2022, with mineralization of the hyperdense  intraluminal contents and slight thickening of the sinus walls in keeping with changes of chronic sinusitis. Several opacified anterior right ethmoid air cells again identified. Remaining paranasal sinuses are clear Soft tissues: Negative. CT CERVICAL SPINE FINDINGS Alignment: Stable 4 mm anterolisthesis C3-4, 2 mm anterolisthesis C4-5, and 2 mm retrolisthesis C5-6. No acute traumatic listhesis identified. Skull base and vertebrae: Right cervical alignment is normal. The atlantodental interval is not widened. No acute fracture of the cervical spine. Ankylosis of the right C2-3 facet joint noted. Soft tissues and spinal canal: No prevertebral fluid or swelling. No visible canal hematoma. 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 Multilevel uncovertebral and facet arthrosis results in multilevel moderate to severe neuroforaminal narrowing, most severe on the right at C3-4 and bilaterally at C5-C7. Moderate central canal stenosis secondary to posterior disc osteophyte complex at C5-6 and to a lesser extent C6-7 with flattening of the thecal sac and in a AP diameter of the spinal canal approximately 6 mm. Prevertebral soft tissues are not thickened on sagittal reformats. Upper chest: Negative. Other: None Traumatic Brain Injury Risk Stratification Skull Fracture: No - Low/mBIG 1 Subdural Hematoma (SDH): 4mm to <48mm - mBIG 2 Subarachnoid Hemorrhage [place]): No Epidural Hematoma (EDH): No - Low/mBIG 1 Cerebral contusion, intra-axial, intraparenchymal Hemorrhage (IPH): No Intraventricular Hemorrhage (IVH): No - Low/mBIG 1 Midline Shift > 1mm or Edema/effacement of sulci/vents: No - Low/mBIG 1 ---------------------------------------------------- IMPRESSION: 1. Imaging criteria compatible with mBIG grade 2 traumatic brain injury, as outlined above. Acute right posterior  parafalcine subdural hematoma extending along the right tentorium extending into the right middle cranial fossa, measuring up to 4-5 mm in thickness. No abnormal mass effect or midline shift. 2. Small frontal scalp hematoma without underlying skull fracture. 3. No acute facial bone fracture. 4. No acute intracranial abnormality. 5. No acute fracture or traumatic malalignment of the cervical spine. 6. Moderate to severe multilevel degenerative changes of the cervical spine, most  severe at C5-C7 where there is moderate central canal stenosis and multilevel moderate to severe neuroforaminal narrowing. 7. Chronic right maxillary sinusitis. These results were called by telephone at the time of interpretation on 07/28/2023 at 5:12 pm to provider COLLIN HINNANT , who verbally acknowledged these results. Electronically Signed   By: Dorethia Molt M.D.   On: 07/28/2023 17:12   CT Cervical Spine Wo Contrast Result Date: 07/28/2023 CLINICAL DATA:  Head trauma, moderate-severe. Fall, racoon eyes present, on warfarin; Facial trauma, blunt EXAM: CT HEAD WITHOUT CONTRAST CT MAXILLOFACIAL WITHOUT CONTRAST CT CERVICAL SPINE WITHOUT CONTRAST TECHNIQUE: Multidetector CT imaging of the head, cervical spine, and maxillofacial structures were performed using the standard protocol without intravenous contrast. Multiplanar CT image reconstructions of the cervical spine and maxillofacial structures were also generated. RADIATION DOSE REDUCTION: This exam was performed according to the departmental dose-optimization program which includes automated exposure control, adjustment of the mA and/or kV according to patient size and/or use of iterative reconstruction technique. COMPARISON:  11/10/2022 FINDINGS: CT HEAD FINDINGS Brain: Acute right posterior parafalcine subdural hematoma is seen layering along the right tentorium extending into the right middle cranial fossa, measuring up to 4-5 mm in thickness (15/2, 23/6). No subarachnoid,  intraparenchymal, or intraventricular hemorrhage identified. No abnormal mass effect or midline shift. Moderate parenchymal volume loss is commensurate with the patient's age. Mild periventricular white matter changes are present likely reflecting the sequela of small vessel ischemia. Ventricular size is normal. Cerebellum is unremarkable. Vascular: No hyperdense vessel or unexpected calcification. Skull: Normal. Negative for fracture or focal lesion. Other: Mastoid air cells and middle ear cavities are clear. Small frontal scalp hematoma. CT MAXILLOFACIAL FINDINGS Osseous: No fracture or mandibular dislocation. No destructive process. Orbits: Negative. No traumatic or inflammatory finding. Ocular lenses of been removed. Sinuses: There is again noted dense opacification of the right maxillary sinus, partially visualized on head CT 11/10/2022, with mineralization of the hyperdense intraluminal contents and slight thickening of the sinus walls in keeping with changes of chronic sinusitis. Several opacified anterior right ethmoid air cells again identified. Remaining paranasal sinuses are clear Soft tissues: Negative. CT CERVICAL SPINE FINDINGS Alignment: Stable 4 mm anterolisthesis C3-4, 2 mm anterolisthesis C4-5, and 2 mm retrolisthesis C5-6. No acute traumatic listhesis identified. Skull base and vertebrae: Right cervical alignment is normal. The atlantodental interval is not widened. No acute fracture of the cervical spine. Ankylosis of the right C2-3 facet joint noted. Soft tissues and spinal canal: No prevertebral fluid or swelling. No visible canal hematoma. 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 Multilevel uncovertebral and facet arthrosis results in multilevel moderate to severe neuroforaminal narrowing, most severe on the right at C3-4 and bilaterally at  C5-C7. Moderate central canal stenosis secondary to posterior disc osteophyte complex at C5-6 and to a lesser extent C6-7 with flattening of the thecal sac and in a AP diameter of the spinal canal approximately 6 mm. Prevertebral soft tissues are not thickened on sagittal reformats. Upper chest: Negative. Other: None Traumatic Brain Injury Risk Stratification Skull Fracture: No - Low/mBIG 1 Subdural Hematoma (SDH): 4mm to <60mm - mBIG 2 Subarachnoid Hemorrhage [facility]): No Epidural Hematoma (EDH): No - Low/mBIG 1 Cerebral contusion, intra-axial, intraparenchymal Hemorrhage (IPH): No Intraventricular Hemorrhage (IVH): No - Low/mBIG 1 Midline Shift > 1mm or Edema/effacement of sulci/vents: No - Low/mBIG 1 ---------------------------------------------------- IMPRESSION: 1. 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 Acute right posterior parafalcine subdural hematoma extending along the right tentorium extending into the right middle cranial fossa, measuring up to 4-5 mm in thickness. No abnormal mass effect or midline shift. 2. Small frontal scalp hematoma without underlying skull fracture. 3. No acute facial bone fracture. 4. No acute intracranial abnormality. 5. No acute fracture or traumatic malalignment of the cervical spine. 6. 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 7. Chronic right maxillary sinusitis. These results were called by telephone at the time of interpretation on 07/28/2023 at 5:12 pm to provider COLLIN HINNANT , who verbally acknowledged these results. Electronically Signed   By: Dorethia Molt M.D.   On: 07/28/2023 17:12   CT Maxillofacial Wo Contrast Result Date: 07/28/2023 CLINICAL DATA:  Head trauma, moderate-severe. Fall, racoon eyes present, on warfarin; Facial trauma, blunt EXAM: CT HEAD  WITHOUT CONTRAST CT MAXILLOFACIAL WITHOUT CONTRAST CT CERVICAL SPINE WITHOUT CONTRAST TECHNIQUE: Multidetector CT imaging of the head, cervical spine, and maxillofacial structures were performed using the standard protocol without intravenous contrast. Multiplanar CT image reconstructions of the cervical spine and maxillofacial structures were also generated. RADIATION DOSE REDUCTION: This exam was performed according to the departmental dose-optimization program which includes automated exposure control, adjustment of the mA and/or kV according to patient size and/or use of iterative reconstruction technique. COMPARISON:  11/10/2022 FINDINGS: CT HEAD FINDINGS Brain: Acute right posterior parafalcine subdural hematoma is seen layering along the right tentorium extending into the right middle cranial fossa, measuring up to 4-5 mm in thickness (15/2, 23/6). No subarachnoid, intraparenchymal, or intraventricular hemorrhage identified. No abnormal mass effect or midline shift. Moderate parenchymal volume loss is commensurate with the patient's age. Mild periventricular white matter changes are present likely reflecting the sequela of small vessel ischemia. Ventricular size is normal. Cerebellum is unremarkable. Vascular: No hyperdense vessel or unexpected calcification. Skull: Normal. Negative for fracture or focal lesion. Other: Mastoid air cells and middle ear cavities are clear. Small frontal scalp hematoma. CT MAXILLOFACIAL FINDINGS Osseous: No fracture or mandibular dislocation. No destructive process. Orbits: Negative. No traumatic or inflammatory finding. Ocular lenses of been removed. Sinuses: There is again noted dense opacification of the right maxillary sinus, partially visualized on head CT 11/10/2022, with mineralization of the hyperdense intraluminal contents and slight thickening of the sinus walls in keeping with changes of chronic sinusitis. Several opacified anterior right ethmoid air cells again  identified. Remaining paranasal sinuses are clear Soft tissues: Negative. CT CERVICAL SPINE FINDINGS Alignment: Stable 4 mm anterolisthesis C3-4, 2 mm anterolisthesis C4-5, and 2 mm retrolisthesis C5-6. No acute traumatic listhesis identified. Skull base and vertebrae: Right cervical alignment is normal. The atlantodental interval is not widened. No acute fracture of the cervical spine. Ankylosis of the right C2-3 facet joint noted. Soft tissues and spinal canal: No prevertebral fluid or swelling. No visible canal hematoma. 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 Multilevel uncovertebral and facet arthrosis results in multilevel moderate to severe neuroforaminal narrowing, most severe on the right at C3-4 and bilaterally at C5-C7. Moderate central canal stenosis secondary to posterior disc osteophyte complex at C5-6 and to a lesser extent C6-7 with flattening of the thecal sac and in a AP diameter of the spinal canal  approximately 6 mm. Prevertebral soft tissues are not thickened on sagittal reformats. Upper chest: Negative. Other: None Traumatic Brain Injury Risk Stratification Skull Fracture: No - Low/mBIG 1 Subdural Hematoma (SDH): 4mm to <58mm - mBIG 2 Subarachnoid Hemorrhage [facility]): No Epidural Hematoma (EDH): No - Low/mBIG 1 Cerebral contusion, intra-axial, intraparenchymal Hemorrhage (IPH): No Intraventricular Hemorrhage (IVH): No - Low/mBIG 1 Midline Shift > 1mm or Edema/effacement of sulci/vents: No - Low/mBIG 1 ---------------------------------------------------- IMPRESSION: 1. Imaging criteria compatible with mBIG grade 2 traumatic brain injury, as outlined above. Acute right posterior parafalcine subdural hematoma extending along the right tentorium extending into the right middle cranial fossa, measuring up to 4-5 mm in thickness. No abnormal mass effect or  midline shift. 2. Small frontal scalp hematoma without underlying skull fracture. 3. No acute facial bone fracture. 4. No acute intracranial abnormality. 5. No acute fracture or traumatic malalignment of the cervical spine. 6. 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 7. Chronic right maxillary sinusitis. These results were called by telephone at the time of interpretation on 07/28/2023 at 5:12 pm to provider COLLIN HINNANT , who verbally acknowledged these results. Electronically Signed   By: Dorethia Molt M.D.   On: 07/28/2023 17:12    Microbiology: Results for orders placed or performed during the hospital encounter of 07/28/23  MRSA Next Gen by PCR, Nasal     Status: None   Collection Time: 07/28/23  9:28 PM   Specimen: Nasal Mucosa; Nasal Swab  Result Value Ref Range Status   MRSA by PCR Next Gen NOT DETECTED NOT DETECTED Final    Comment: (NOTE) The GeneXpert MRSA Assay (FDA approved for NASAL specimens only), is one component of a comprehensive MRSA colonization surveillance program. It is not intended to diagnose MRSA infection nor to guide or monitor treatment for MRSA infections. Test performance is not FDA approved in patients less than 68 years old. Performed at [facility], [address], [place], [place] [address]     Labs: CBC: Recent Labs  Lab 07/28/23 1330 07/29/23 0732 07/30/23 0447  WBC 6.9 7.3 9.8  NEUTROABS 4.7  --   --   HGB 9.6* 9.4* 10.4*  HCT 29.9* 29.8* 33.0*  MCV 94.9 98.3 99.4  PLT 216 208 237   Basic Metabolic Panel: Recent Labs  Lab 07/28/23 1330 07/29/23 0255 07/30/23 0447  NA 132* 131* 139  K 5.1 4.3 4.1  CL 100 99 106  CO2 26 24 20*  GLUCOSE 96 101* 98  BUN 41* 35* 24*  CREATININE 1.93* 1.67* 1.52*  CALCIUM  9.3 9.0 9.6  MG  --  2.2  --   PHOS  --  4.1  --     Liver Function Tests: Recent Labs  Lab 07/28/23 1330 07/29/23 0255  AST 19 27  ALT 19 27  ALKPHOS 90 82  BILITOT 0.4 0.8  PROT 6.8 6.9  ALBUMIN 4.0 3.8   CBG: No results for input(s): GLUCAP in the last 168 hours.  Discharge time spent: approximately 45 minutes spent on discharge counseling, evaluation of patient on day of discharge, and coordination of discharge planning with nursing, social work, pharmacy and case management  Signed: Lonni SHAUNNA Dalton, MD [facility] 07/31/2023

## 2023-07-31 NOTE — TOC Transition Note (Addendum)
 Transition of Care [facility]) - Discharge Note   Patient Details  Name: Brandi [name] MRN: [id number] Date of Birth: 05/30/1940  [facility]) CM/SW Contact:  Sonda Manuella Quill, RN Phone Number: 07/31/2023, 11:18 AM   Clinical Narrative:    D/C orders received; spoke w/ Shona at [facility]; she gave RM # [phone], call report # [phone], ext [phone]; pt transport by [facility]; spoke w/ pt's husband Fatima Fedie [phone]) and he agreed to d/c plan; awaiting D/C summary before calling [facility].  -1157- D/C summary and SNF transfer report sent via SNF hub; [facility] called at 1158; no TOC needs.  -1314- notified by Shona at facility that pt's room has been changed to #[id number]; Damien, RN notified via secure chat. Final next level of care: Skilled Nursing Facility Barriers to Discharge: No Barriers Identified   Patient Goals and CMS Choice Patient states their goals for this hospitalization and ongoing recovery are:: per spouse, short term rehab/SNF prior to return home CMS Medicare.gov Compare Post Acute Care list provided to:: Patient Represenative (must comment) ([place],Craig (Spouse)  [phone] (Mobile)) Choice offered to / list presented to : Spouse [place] ownership interest in [facility].provided to:: Spouse    Discharge Placement              Patient [facility] bed at: [facility] Patient to be transferred to facility by: [facility] Name of family member notified: Saryah Loper (spouse) [phone] Patient and family notified of of transfer: 07/31/23  Discharge Plan and Services Additional resources added to the After Visit Summary for                                       Social Drivers of Health (SDOH) Interventions SDOH Screenings   Food Insecurity: No Food Insecurity (07/29/2023)  Housing: Low Risk  (07/29/2023)  Transportation Needs: No Transportation Needs (07/29/2023)  Utilities: Not At Risk (07/29/2023)  Depression (PHQ2-9): Low  Risk  (07/28/2023)  Social Connections: Socially Integrated (07/29/2023)  Tobacco Use: Low Risk  (07/30/2023)     Readmission Risk Interventions    07/30/2023   11:14 AM 11/12/2022    3:06 PM  Readmission Risk Prevention Plan  Transportation Screening Complete Complete  PCP or Specialist Appt within 3-5 Days  Complete  HRI or Home Care Consult Complete Complete  Social Work Consult for Recovery Care Planning/Counseling Complete   Palliative Care Screening Not Applicable Complete  Medication Review Oceanographer) Complete Complete

## 2023-07-31 NOTE — TOC Progression Note (Addendum)
 Transition of Care [facility]) - Progression Note    Patient Details  Name: Brandi [name] MRN: [id number] Date of Birth: 1940-11-25  [facility] CM/SW Contact  Sonda Manuella Quill, RN Phone Number: 07/31/2023, 10:53 AM  Clinical Narrative:    Ins auth received, Plan Auth ID # [id number], Auth ID # [id number]; start 07/30/23, end 08/03/23; LVM for Shona Chang, [facility]missions at [facility] for pt admit today; awaiting return call.  -1100- return call from [facility]; she will review pt's chart and call back w/ RM# and call report #; awaiting return call; Dr Jonel present during interaction.  Expected Discharge Plan: Skilled Nursing Facility Barriers to Discharge: Continued Medical Work up, English as a second language teacher  Expected Discharge Plan and Services       Living arrangements for the past 2 months: Single Family Home                                       Social Determinants of Health (SDOH) Interventions SDOH Screenings   Food Insecurity: No Food Insecurity (07/29/2023)  Housing: Low Risk  (07/29/2023)  Transportation Needs: No Transportation Needs (07/29/2023)  Utilities: Not At Risk (07/29/2023)  Depression (PHQ2-9): Low Risk  (07/28/2023)  Social Connections: Socially Integrated (07/29/2023)  Tobacco Use: Low Risk  (07/30/2023)    Readmission Risk Interventions    07/30/2023   11:14 AM 11/12/2022    3:06 PM  Readmission Risk Prevention Plan  Transportation Screening Complete Complete  PCP or Specialist Appt within 3-5 Days  Complete  HRI or Home Care Consult Complete Complete  Social Work Consult for Recovery Care Planning/Counseling Complete   Palliative Care Screening Not Applicable Complete  Medication Review Oceanographer) Complete Complete

## 2023-08-03 ENCOUNTER — Telehealth: Payer: Self-pay | Admitting: Nurse Practitioner

## 2023-08-03 NOTE — Telephone Encounter (Signed)
 Scheduled appointments per los. Talked with the patients spouse and he is aware of the made appointments for the patient.

## 2023-09-01 ENCOUNTER — Encounter: Payer: Self-pay | Admitting: *Deleted

## 2023-09-02 ENCOUNTER — Encounter: Payer: Self-pay | Admitting: *Deleted

## 2023-09-02 ENCOUNTER — Telehealth: Payer: Self-pay | Admitting: Hematology

## 2023-09-02 NOTE — Telephone Encounter (Signed)
 Called and spoke with patient  spouse and he is aware of the upcoming appts.

## 2023-09-06 ENCOUNTER — Other Ambulatory Visit: Payer: Self-pay | Admitting: Internal Medicine

## 2023-09-06 ENCOUNTER — Ambulatory Visit
Admission: RE | Admit: 2023-09-06 | Discharge: 2023-09-06 | Disposition: A | Discharge status: Home / Self Care | Source: Ambulatory Visit | Attending: Internal Medicine | Admitting: Internal Medicine

## 2023-09-06 DIAGNOSIS — S065X0D Traumatic subdural hemorrhage without loss of consciousness, subsequent encounter: Secondary | ICD-10-CM

## 2023-09-09 ENCOUNTER — Inpatient Hospital Stay

## 2023-09-09 ENCOUNTER — Inpatient Hospital Stay: Admitting: Nurse Practitioner

## 2023-09-13 ENCOUNTER — Encounter: Payer: Self-pay | Admitting: *Deleted

## 2023-09-13 ENCOUNTER — Other Ambulatory Visit: Payer: Self-pay

## 2023-09-13 DIAGNOSIS — Z170 Estrogen receptor positive status [ER+]: Secondary | ICD-10-CM

## 2023-09-14 ENCOUNTER — Inpatient Hospital Stay: Attending: Hematology

## 2023-09-14 ENCOUNTER — Inpatient Hospital Stay (HOSPITAL_BASED_OUTPATIENT_CLINIC_OR_DEPARTMENT_OTHER): Admitting: Hematology

## 2023-09-14 VITALS — BP 138/64 | HR 72 | Temp 98.0°F | Resp 15 | Ht 59.0 in | Wt 120.7 lb

## 2023-09-14 DIAGNOSIS — Z1732 Human epidermal growth factor receptor 2 negative status: Secondary | ICD-10-CM | POA: Insufficient documentation

## 2023-09-14 DIAGNOSIS — Z1721 Progesterone receptor positive status: Secondary | ICD-10-CM | POA: Diagnosis not present

## 2023-09-14 DIAGNOSIS — C50811 Malignant neoplasm of overlapping sites of right female breast: Secondary | ICD-10-CM | POA: Insufficient documentation

## 2023-09-14 DIAGNOSIS — Z79899 Other long term (current) drug therapy: Secondary | ICD-10-CM | POA: Insufficient documentation

## 2023-09-14 DIAGNOSIS — Z170 Estrogen receptor positive status [ER+]: Secondary | ICD-10-CM | POA: Diagnosis not present

## 2023-09-14 DIAGNOSIS — M2550 Pain in unspecified joint: Secondary | ICD-10-CM | POA: Diagnosis not present

## 2023-09-14 DIAGNOSIS — Z79811 Long term (current) use of aromatase inhibitors: Secondary | ICD-10-CM | POA: Diagnosis not present

## 2023-09-14 LAB — CMP (CANCER CENTER ONLY)
ALT: 9 U/L (ref 0–44)
AST: 16 U/L (ref 15–41)
Albumin: 3.9 g/dL (ref 3.5–5.0)
Alkaline Phosphatase: 87 U/L (ref 38–126)
Anion gap: 6 (ref 5–15)
BUN: 28 mg/dL — ABNORMAL HIGH (ref 8–23)
CO2: 25 mmol/L (ref 22–32)
Calcium: 9.7 mg/dL (ref 8.9–10.3)
Chloride: 107 mmol/L (ref 98–111)
Creatinine: 1.64 mg/dL — ABNORMAL HIGH (ref 0.44–1.00)
GFR, Estimated: 31 mL/min — ABNORMAL LOW (ref >60)
Glucose, Bld: 115 mg/dL — ABNORMAL HIGH (ref 70–99)
Potassium: 5.1 mmol/L (ref 3.5–5.1)
Sodium: 138 mmol/L (ref 135–145)
Total Bilirubin: 0.4 mg/dL (ref 0.0–1.2)
Total Protein: 6.8 g/dL (ref 6.5–8.1)

## 2023-09-14 LAB — CBC WITH DIFFERENTIAL (CANCER CENTER ONLY)
Abs Immature Granulocytes: 0.04 K/uL (ref 0.00–0.07)
Basophils Absolute: 0.1 K/uL (ref 0.0–0.1)
Basophils Relative: 1 %
Eosinophils Absolute: 0.3 K/uL (ref 0.0–0.5)
Eosinophils Relative: 4 %
HCT: 34.1 % — ABNORMAL LOW (ref 36.0–46.0)
Hemoglobin: 10.7 g/dL — ABNORMAL LOW (ref 12.0–15.0)
Immature Granulocytes: 1 %
Lymphocytes Relative: 26 %
Lymphs Abs: 1.7 K/uL (ref 0.7–4.0)
MCH: 30.8 pg (ref 26.0–34.0)
MCHC: 31.4 g/dL (ref 30.0–36.0)
MCV: 98.3 fL (ref 80.0–100.0)
Monocytes Absolute: 0.6 K/uL (ref 0.1–1.0)
Monocytes Relative: 9 %
Neutro Abs: 4 K/uL (ref 1.7–7.7)
Neutrophils Relative %: 59 %
Platelet Count: 226 K/uL (ref 150–400)
RBC: 3.47 MIL/uL — ABNORMAL LOW (ref 3.87–5.11)
RDW: 13.4 % (ref 11.5–15.5)
WBC Count: 6.6 K/uL (ref 4.0–10.5)
nRBC: 0 % (ref 0.0–0.2)

## 2023-09-14 MED ORDER — ANASTROZOLE 1 MG PO TABS: 1.0000 mg | ORAL_TABLET | Freq: Every day | ORAL | 5 refills | Status: AC

## 2023-09-14 NOTE — Progress Notes (Signed)
[facility]   [phone] [phone]   Clinic Follow up Note   Patient Care Team: Addie Camellia CROME, MD as PCP - General (Internal Medicine) Perla Evalene PARAS, MD as PCP - Cardiology (Cardiology) Tyree Nanetta SAILOR, RN as Oncology Nurse Navigator Glean, Stephane BROCKS, RN (Inactive) as Oncology Nurse Navigator Aron Shoulders, MD as Consulting Physician (General Surgery) Lanny Callander, MD as Consulting Physician (Hematology) Shannon Agent, MD as Consulting Physician (Radiation Oncology)  Date of Service:  09/14/2023  CHIEF COMPLAINT: f/u of right breast cancer  CURRENT THERAPY:  Neoadjuvant anastrozole   Oncology History   Malignant neoplasm of overlapping sites of right breast in female, 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  -diagnosed in 04/2023.  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 Tumor size 3.3 cm at 9 o'clock position and 0.6 cm at 11 o'clock position with suspected lymph node involvement. Stage IIIB. Chemotherapy is not recommended due to her age and health status. Anti-estrogen therapy is advised due to strong ER and PR positivity.  -Surgery is deferred initially due to concerns about tolerance.  - Her CT scan from August 2024 was negative for metastasis.  A bone scan 05/14/2023 was also negative -she started anastrozole  in late April 2025, tolerating well   Assessment & Plan Right breast cancer Right breast cancer initially measured at 3.3 x 2.8 x 1.9 cm, now reduced to 2.6 x 1.5 x 3 cm after three to four months of anastrozole  treatment. The tumor is smaller and less ulcerated, with no black spots, indicating a healing ulcer. However, the tumor persists, and anastrozole  alone will not cure the cancer. Surgery remains the only curative option due to the risk of tumor regrowth or metastasis. Surgery is considered minor, likely not  requiring lymph node removal, and she should tolerate it well. - Continue anastrozole . - Send a message to Dr. Cori to discuss potential surgery. - Schedule follow-up in three months. - Order repeat imaging (mammogram, ultrasound, or MRI) before the next appointment to assess tumor size and check for new lesions or lymph node involvement. - Refill anastrozole  prescription for six months at [facility] pharmacy.  Plan - She is tolerating anastrozole  very well, and responding well.  The ulcerated right breast mass is smaller, skin also mostly healed.  Repeated her mammogram and ultrasound also showed some shrinkage, no other new lesions. - Continue anastrozole , follow-up in 3 months. - Will message her surgeon Dr. Aron to see if she plans to offer surgery, patient is agreeable.   SUMMARY OF ONCOLOGIC HISTORY: Oncology History  Malignant neoplasm of overlapping sites of right breast in female, estrogen receptor positive (HCC)  05/10/2023 Initial Diagnosis   Malignant neoplasm of overlapping sites of right breast in female, estrogen receptor positive (HCC)   05/12/2023 Cancer Staging   Staging form: Breast, AJCC 8th Edition - Clinical stage from 05/12/2023: Stage IIIB (cT4, cN1, cM0, G2, ER+, PR+, HER2-) - Signed by Lanny Callander, MD on 05/12/2023 Stage prefix: Initial diagnosis Histologic grading system: 3 grade system      Discussed the use of AI scribe software for clinical note transcription with the patient, who gave verbal consent to proceed.  History of Present Illness Brandi [name] is an 83 year old female with breast cancer who presents for follow-up.  She has been on anastrozole  for three to four months without experiencing hot flashes or worsening joint pain.  Recent imaging on September 10, 2023, shows a reduction in tumor size from 3.3 cm to 2.6 cm. The tumor dimensions have changed from 3.3 x 2.8 x 1.9 cm to 2.6 x 1.5 x 3 cm. She plans to refill her anastrozole  prescription for  another six months at the [place] [facility] pharmacy. Persistent joint pain is present but not worsening.     All other systems were reviewed with the patient and are negative.  MEDICAL HISTORY:  Past Medical History:  Diagnosis Date   Allergy    Atrial fibrillation (HCC)    Dysrhythmia    Hyperlipidemia    Hypertension    Neuromuscular disorder (HCC)    Venous insufficiency     SURGICAL HISTORY: Past Surgical History:  Procedure Laterality Date   ANKLE SURGERY Left    INTRAMEDULLARY (IM) NAIL INTERTROCHANTERIC Left 11/11/2022   Procedure: INTRAMEDULLARY (IM) NAIL INTERTROCHANTERIC;  Surgeon: Edna Toribio LABOR, MD;  Location: [facility];  Service: Orthopedics;  Laterality: Left;   KNEE SURGERY Left    VARICOSE VEIN SURGERY      I have reviewed the social history and family history with the patient and they are unchanged from previous note.  ALLERGIES:  is allergic to amoxil [amoxicillin], celebrex [celecoxib], and keppra  [levetiracetam ].  MEDICATIONS:  Current Outpatient Medications  Medication Sig Dispense Refill   acetaminophen  (TYLENOL ) 500 MG tablet Take 1,000 mg by mouth every 6 (six) hours as needed for mild pain.     anastrozole  (ARIMIDEX ) 1 MG tablet Take 1 tablet (1 mg total) by mouth daily. 30 tablet 5   azelastine  (ASTELIN ) 0.1 % nasal spray Place 2 sprays into both nostrils daily as needed for rhinitis or allergies.     cetirizine (ZYRTEC) 10 MG tablet Take 10 mg by mouth daily as needed for allergies.     clotrimazole-betamethasone (LOTRISONE) cream Apply 1 Application topically as needed (rash).     diltiazem  (CARDIZEM  SR) 60 MG 12 hr capsule Take 60 mg by mouth every 12 (twelve) hours.     ferrous sulfate  325 (65 FE) MG tablet Take 325 mg by mouth daily with breakfast.     guanFACINE  (TENEX ) 2 MG tablet Take 2 mg by mouth every evening.     Lidocaine  HCl (ASPERCREME LIDOCAINE ) 4 % LIQD Apply 1 Application topically 2 (two) times daily as needed (knee pain).      metoprolol  succinate (TOPROL -XL) 25 MG 24 hr tablet Take 25 mg by mouth 2 (two) times daily.     Multiple Vitamins-Minerals (CENTRUM SILVER 50+WOMEN) TABS Take 1 tablet by mouth every evening.     pantoprazole  (PROTONIX ) 40 MG tablet Take 1 tablet (40 mg total) by mouth daily. 30 tablet 0   polyethylene glycol (MIRALAX  / GLYCOLAX ) 17 g packet Take 17 g by mouth daily. (Patient taking differently: Take 17 g by mouth daily as needed for moderate constipation.)     rosuvastatin  (CRESTOR ) 10 MG tablet Take 10 mg by mouth See admin instructions. Take 10 mg by mouth at bedtime on Sun/Wed only (twice a week)     senna-docusate (SENOKOT-S) 8.6-50 MG tablet Take 1 tablet by mouth at bedtime as needed for mild constipation. 30 tablet 0   sertraline  (ZOLOFT ) 50 MG tablet Take 75 mg by mouth at bedtime.     spironolactone  (ALDACTONE ) 25 MG tablet Take 25 mg by mouth daily.     SYSTANE COMPLETE PF 0.6 % SOLN Place 1 drop into both eyes in the morning, at noon, and at bedtime.     [  Paused] warfarin (JANTOVEN ) 2 MG tablet TAKE 1/2 TO 1 TABLET (1 TO 2MG ) DAILY OR AS DIRECTED  BY ANTICOAGULATION CLINIC 50 tablet 1   No current facility-administered medications for this visit.    PHYSICAL EXAMINATION: ECOG PERFORMANCE STATUS: 2 - Symptomatic, <50% confined to bed  Vitals:   09/14/23 1312  BP: 138/64  Pulse: 72  Resp: 15  Temp: 98 F (36.7 C)  SpO2: 94%   Wt Readings from Last 3 Encounters:  09/14/23 120 lb 11.2 oz (54.7 kg)  07/28/23 118 lb (53.5 kg)  07/28/23 118 lb (53.5 kg)     GENERAL:alert, no distress and comfortable SKIN: skin color, texture, turgor are normal, no rashes or significant lesions EYES: normal, Conjunctiva are pink and non-injected, sclera clear NECK: supple, thyroid  normal size, non-tender, without nodularity LYMPH:  no palpable lymphadenopathy in the cervical, axillary  LUNGS: clear to auscultation and percussion with normal breathing effort HEART: regular rate & rhythm  and no murmurs and no lower extremity edema ABDOMEN:abdomen soft, non-tender and normal bowel sounds Musculoskeletal:no cyanosis of digits and no clubbing  NEURO: alert & oriented x 3 with fluent speech, no focal motor/sensory deficits BREAST: Right breast mass smaller, measuring 2X2cm now, the superficial ulcer has healed.  No other palpable breast mass or adenopathy.   Physical Exam   LABORATORY DATA:  I have reviewed the data as listed    Latest Ref Rng & Units 09/14/2023    1:03 PM 07/30/2023    4:47 AM 07/29/2023    7:32 AM  CBC  WBC 4.0 - 10.5 K/uL 6.6  9.8  7.3   Hemoglobin 12.0 - 15.0 g/dL 89.2  89.5  9.4   Hematocrit 36.0 - 46.0 % 34.1  33.0  29.8   Platelets 150 - 400 K/uL 226  237  208         Latest Ref Rng & Units 07/30/2023    4:47 AM 07/29/2023    2:55 AM 07/28/2023    1:30 PM  CMP  Glucose 70 - 99 mg/dL 98  898  96   BUN 8 - 23 mg/dL 24  35  41   Creatinine 0.44 - 1.00 mg/dL 8.47  8.32  8.06   Sodium 135 - 145 mmol/L 139  131  132   Potassium 3.5 - 5.1 mmol/L 4.1  4.3  5.1   Chloride 98 - 111 mmol/L 106  99  100   CO2 22 - 32 mmol/L 20  24  26    Calcium  8.9 - 10.3 mg/dL 9.6  9.0  9.3   Total Protein 6.5 - 8.1 g/dL  6.9  6.8   Total Bilirubin 0.0 - 1.2 mg/dL  0.8  0.4   Alkaline Phos 38 - 126 U/L  82  90   AST 15 - 41 U/L  27  19   ALT 0 - 44 U/L  27  19       RADIOGRAPHIC STUDIES: I have personally reviewed the radiological images as listed and agreed with the findings in the report. No results found.    No orders of the defined types were placed in this encounter.  All questions were answered. The patient knows to call the clinic with any problems, questions or concerns. No barriers to learning was detected. The total time spent in the appointment was 30 minutes, including review of chart and various tests results, discussions about plan of care and coordination of care plan     Onita Mattock, MD  09/14/2023     

## 2023-09-14 NOTE — Assessment & Plan Note (Signed)
 invasive ductal carcinoma of breast, mcT4N1M0 stage IIIB.  -diagnosed in 04/2023.  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 Tumor size 3.3 cm at 9 o'clock position and 0.6 cm at 11 o'clock position with suspected lymph node involvement. Stage IIIB. Chemotherapy is not recommended due to her age and health status. Anti-estrogen therapy is advised due to strong ER and PR positivity.  -Surgery is deferred initially due to concerns about tolerance.  - Her CT scan from August 2024 was negative for metastasis.  A bone scan 05/14/2023 was also negative -she started anastrozole  in late April 2025, tolerating well

## 2023-09-15 ENCOUNTER — Encounter: Payer: Self-pay | Admitting: *Deleted

## 2023-09-17 ENCOUNTER — Encounter: Payer: Self-pay | Admitting: Hematology

## 2023-10-06 ENCOUNTER — Ambulatory Visit (INDEPENDENT_AMBULATORY_CARE_PROVIDER_SITE_OTHER): Admitting: Internal Medicine

## 2023-10-06 DIAGNOSIS — Z7901 Long term (current) use of anticoagulants: Secondary | ICD-10-CM

## 2023-10-06 LAB — POCT INR: INR: 2 (ref 2.0–3.0)

## 2023-10-12 ENCOUNTER — Emergency Department (HOSPITAL_COMMUNITY)

## 2023-10-12 ENCOUNTER — Other Ambulatory Visit: Payer: Self-pay

## 2023-10-12 ENCOUNTER — Inpatient Hospital Stay (HOSPITAL_COMMUNITY)
Admission: EM | Admit: 2023-10-12 | Discharge: 2023-10-20 | DRG: 956 | Disposition: A | Discharge status: Skilled Nursing Facility | Attending: Hospitalist | Admitting: Hospitalist

## 2023-10-12 DIAGNOSIS — M9712XA Periprosthetic fracture around internal prosthetic left knee joint, initial encounter: Secondary | ICD-10-CM | POA: Diagnosis present

## 2023-10-12 DIAGNOSIS — G9341 Metabolic encephalopathy: Secondary | ICD-10-CM | POA: Diagnosis present

## 2023-10-12 DIAGNOSIS — C50811 Malignant neoplasm of overlapping sites of right female breast: Secondary | ICD-10-CM | POA: Diagnosis present

## 2023-10-12 DIAGNOSIS — Z880 Allergy status to penicillin: Secondary | ICD-10-CM

## 2023-10-12 DIAGNOSIS — Z9181 History of falling: Secondary | ICD-10-CM | POA: Diagnosis not present

## 2023-10-12 DIAGNOSIS — W19XXXA Unspecified fall, initial encounter: Secondary | ICD-10-CM | POA: Diagnosis not present

## 2023-10-12 DIAGNOSIS — M25552 Pain in left hip: Secondary | ICD-10-CM | POA: Diagnosis present

## 2023-10-12 DIAGNOSIS — I2489 Other forms of acute ischemic heart disease: Secondary | ICD-10-CM | POA: Diagnosis present

## 2023-10-12 DIAGNOSIS — R000 Tachycardia, unspecified: Secondary | ICD-10-CM | POA: Diagnosis present

## 2023-10-12 DIAGNOSIS — I129 Hypertensive chronic kidney disease with stage 1 through stage 4 chronic kidney disease, or unspecified chronic kidney disease: Secondary | ICD-10-CM | POA: Diagnosis present

## 2023-10-12 DIAGNOSIS — D62 Acute posthemorrhagic anemia: Secondary | ICD-10-CM | POA: Diagnosis present

## 2023-10-12 DIAGNOSIS — R338 Other retention of urine: Secondary | ICD-10-CM | POA: Diagnosis present

## 2023-10-12 DIAGNOSIS — S72402A Unspecified fracture of lower end of left femur, initial encounter for closed fracture: Principal | ICD-10-CM

## 2023-10-12 DIAGNOSIS — Y92009 Unspecified place in unspecified non-institutional (private) residence as the place of occurrence of the external cause: Secondary | ICD-10-CM

## 2023-10-12 DIAGNOSIS — I4892 Unspecified atrial flutter: Secondary | ICD-10-CM | POA: Diagnosis present

## 2023-10-12 DIAGNOSIS — W1830XA Fall on same level, unspecified, initial encounter: Secondary | ICD-10-CM | POA: Diagnosis present

## 2023-10-12 DIAGNOSIS — M978XXA Periprosthetic fracture around other internal prosthetic joint, initial encounter: Secondary | ICD-10-CM | POA: Diagnosis not present

## 2023-10-12 DIAGNOSIS — S72452A Displaced supracondylar fracture without intracondylar extension of lower end of left femur, initial encounter for closed fracture: Principal | ICD-10-CM | POA: Diagnosis present

## 2023-10-12 DIAGNOSIS — Z79811 Long term (current) use of aromatase inhibitors: Secondary | ICD-10-CM | POA: Diagnosis not present

## 2023-10-12 DIAGNOSIS — Z79899 Other long term (current) drug therapy: Secondary | ICD-10-CM | POA: Diagnosis not present

## 2023-10-12 DIAGNOSIS — I4891 Unspecified atrial fibrillation: Secondary | ICD-10-CM | POA: Diagnosis not present

## 2023-10-12 DIAGNOSIS — N179 Acute kidney failure, unspecified: Secondary | ICD-10-CM | POA: Diagnosis present

## 2023-10-12 DIAGNOSIS — Z7901 Long term (current) use of anticoagulants: Secondary | ICD-10-CM | POA: Diagnosis not present

## 2023-10-12 DIAGNOSIS — E785 Hyperlipidemia, unspecified: Secondary | ICD-10-CM | POA: Diagnosis present

## 2023-10-12 DIAGNOSIS — R791 Abnormal coagulation profile: Secondary | ICD-10-CM | POA: Diagnosis present

## 2023-10-12 DIAGNOSIS — N1832 Chronic kidney disease, stage 3b: Secondary | ICD-10-CM | POA: Diagnosis present

## 2023-10-12 DIAGNOSIS — S3282XA Multiple fractures of pelvis without disruption of pelvic ring, initial encounter for closed fracture: Secondary | ICD-10-CM | POA: Diagnosis present

## 2023-10-12 DIAGNOSIS — I480 Paroxysmal atrial fibrillation: Secondary | ICD-10-CM | POA: Diagnosis present

## 2023-10-12 DIAGNOSIS — Z8669 Personal history of other diseases of the nervous system and sense organs: Secondary | ICD-10-CM

## 2023-10-12 DIAGNOSIS — Z888 Allergy status to other drugs, medicaments and biological substances status: Secondary | ICD-10-CM

## 2023-10-12 DIAGNOSIS — S72001A Fracture of unspecified part of neck of right femur, initial encounter for closed fracture: Secondary | ICD-10-CM | POA: Diagnosis not present

## 2023-10-12 DIAGNOSIS — S065X0A Traumatic subdural hemorrhage without loss of consciousness, initial encounter: Secondary | ICD-10-CM | POA: Diagnosis present

## 2023-10-12 DIAGNOSIS — Z01810 Encounter for preprocedural cardiovascular examination: Secondary | ICD-10-CM | POA: Diagnosis not present

## 2023-10-12 DIAGNOSIS — Z8249 Family history of ischemic heart disease and other diseases of the circulatory system: Secondary | ICD-10-CM | POA: Diagnosis not present

## 2023-10-12 DIAGNOSIS — I10 Essential (primary) hypertension: Secondary | ICD-10-CM | POA: Diagnosis present

## 2023-10-12 DIAGNOSIS — Z170 Estrogen receptor positive status [ER+]: Secondary | ICD-10-CM

## 2023-10-12 DIAGNOSIS — R531 Weakness: Secondary | ICD-10-CM | POA: Diagnosis present

## 2023-10-12 LAB — CBC WITH DIFFERENTIAL/PLATELET
Abs Immature Granulocytes: 0.05 K/uL (ref 0.00–0.07)
Basophils Absolute: 0 K/uL (ref 0.0–0.1)
Basophils Relative: 0 %
Eosinophils Absolute: 0.1 K/uL (ref 0.0–0.5)
Eosinophils Relative: 1 %
HCT: 29.1 % — ABNORMAL LOW (ref 36.0–46.0)
Hemoglobin: 9.1 g/dL — ABNORMAL LOW (ref 12.0–15.0)
Immature Granulocytes: 1 %
Lymphocytes Relative: 12 %
Lymphs Abs: 1.1 K/uL (ref 0.7–4.0)
MCH: 31.5 pg (ref 26.0–34.0)
MCHC: 31.3 g/dL (ref 30.0–36.0)
MCV: 100.7 fL — ABNORMAL HIGH (ref 80.0–100.0)
Monocytes Absolute: 0.6 K/uL (ref 0.1–1.0)
Monocytes Relative: 7 %
Neutro Abs: 7.3 K/uL (ref 1.7–7.7)
Neutrophils Relative %: 79 %
Platelets: 211 K/uL (ref 150–400)
RBC: 2.89 MIL/uL — ABNORMAL LOW (ref 3.87–5.11)
RDW: 12.9 % (ref 11.5–15.5)
WBC: 9.1 K/uL (ref 4.0–10.5)
nRBC: 0 % (ref 0.0–0.2)

## 2023-10-12 LAB — BASIC METABOLIC PANEL WITH GFR
Anion gap: 10 (ref 5–15)
BUN: 33 mg/dL — ABNORMAL HIGH (ref 8–23)
CO2: 22 mmol/L (ref 22–32)
Calcium: 8.9 mg/dL (ref 8.9–10.3)
Chloride: 101 mmol/L (ref 98–111)
Creatinine, Ser: 1.76 mg/dL — ABNORMAL HIGH (ref 0.44–1.00)
GFR, Estimated: 28 mL/min — ABNORMAL LOW (ref >60)
Glucose, Bld: 120 mg/dL — ABNORMAL HIGH (ref 70–99)
Potassium: 4.4 mmol/L (ref 3.5–5.1)
Sodium: 133 mmol/L — ABNORMAL LOW (ref 135–145)

## 2023-10-12 LAB — FOLATE: Folate: >20 ng/mL (ref >5.9)

## 2023-10-12 LAB — HEPATIC FUNCTION PANEL
ALT: 13 U/L (ref 0–44)
AST: 21 U/L (ref 15–41)
Albumin: 3.4 g/dL — ABNORMAL LOW (ref 3.5–5.0)
Alkaline Phosphatase: 72 U/L (ref 38–126)
Bilirubin, Direct: <0.1 mg/dL (ref 0.0–0.2)
Total Bilirubin: 0.6 mg/dL (ref 0.0–1.2)
Total Protein: 5.9 g/dL — ABNORMAL LOW (ref 6.5–8.1)

## 2023-10-12 LAB — MAGNESIUM: Magnesium: 1.8 mg/dL (ref 1.7–2.4)

## 2023-10-12 MED ORDER — ROSUVASTATIN CALCIUM 5 MG PO TABS
10.0000 mg | ORAL_TABLET | ORAL | Status: DC
  Administered 2023-10-13 – 2023-10-20 (×3): 10 mg via ORAL
  Filled 2023-10-12 (×5): qty 2

## 2023-10-12 MED ORDER — ACETAMINOPHEN 500 MG PO TABS
1000.0000 mg | ORAL_TABLET | Freq: Once | ORAL | Status: AC
  Administered 2023-10-12: 1000 mg via ORAL
  Filled 2023-10-12: qty 2

## 2023-10-12 MED ORDER — FENTANYL CITRATE PF 50 MCG/ML IJ SOSY
50.0000 ug | PREFILLED_SYRINGE | Freq: Once | INTRAMUSCULAR | Status: AC
  Administered 2023-10-12: 50 ug via INTRAVENOUS
  Filled 2023-10-12: qty 1

## 2023-10-12 MED ORDER — METHOCARBAMOL 1000 MG/10ML IJ SOLN: 500.0000 mg | Freq: Four times a day (QID) | INTRAMUSCULAR | Status: DC | PRN

## 2023-10-12 MED ORDER — MORPHINE SULFATE (PF) 2 MG/ML IV SOLN
2.0000 mg | INTRAVENOUS | Status: DC | PRN
  Administered 2023-10-12 – 2023-10-15 (×7): 2 mg via INTRAVENOUS
  Filled 2023-10-12 (×7): qty 1

## 2023-10-12 MED ORDER — FENTANYL CITRATE PF 50 MCG/ML IJ SOSY
25.0000 ug | PREFILLED_SYRINGE | Freq: Once | INTRAMUSCULAR | Status: AC
  Administered 2023-10-12: 25 ug via INTRAVENOUS
  Filled 2023-10-12: qty 1

## 2023-10-12 MED ORDER — DOCUSATE SODIUM 100 MG PO CAPS
100.0000 mg | ORAL_CAPSULE | Freq: Two times a day (BID) | ORAL | Status: DC
  Administered 2023-10-12 – 2023-10-20 (×12): 100 mg via ORAL
  Filled 2023-10-12 (×12): qty 1

## 2023-10-12 MED ORDER — BISACODYL 5 MG PO TBEC: 5.0000 mg | DELAYED_RELEASE_TABLET | Freq: Every day | ORAL | Status: DC | PRN

## 2023-10-12 MED ORDER — METHOCARBAMOL 500 MG PO TABS
500.0000 mg | ORAL_TABLET | Freq: Four times a day (QID) | ORAL | Status: DC | PRN
  Administered 2023-10-14 – 2023-10-19 (×2): 500 mg via ORAL
  Filled 2023-10-12 (×2): qty 1

## 2023-10-12 MED ORDER — PANTOPRAZOLE SODIUM 40 MG PO TBEC
40.0000 mg | DELAYED_RELEASE_TABLET | Freq: Every day | ORAL | Status: DC
Start: 2023-10-12 — End: 2023-10-20
  Administered 2023-10-12 – 2023-10-20 (×8): 40 mg via ORAL
  Filled 2023-10-12 (×9): qty 1

## 2023-10-12 MED ORDER — DILTIAZEM HCL ER 60 MG PO CP12
60.0000 mg | ORAL_CAPSULE | Freq: Two times a day (BID) | ORAL | Status: DC
Start: 2023-10-12 — End: 2023-10-13
  Administered 2023-10-12: 60 mg via ORAL
  Filled 2023-10-12 (×5): qty 1

## 2023-10-12 MED ORDER — POLYETHYLENE GLYCOL 3350 17 G PO PACK: 17.0000 g | PACK | Freq: Every day | ORAL | Status: DC | PRN

## 2023-10-12 MED ORDER — SERTRALINE HCL 50 MG PO TABS
75.0000 mg | ORAL_TABLET | Freq: Every day | ORAL | Status: DC
  Administered 2023-10-12 – 2023-10-19 (×8): 75 mg via ORAL
  Filled 2023-10-12: qty 3
  Filled 2023-10-12 (×3): qty 1
  Filled 2023-10-12 (×2): qty 3
  Filled 2023-10-12: qty 1

## 2023-10-12 MED ORDER — LORATADINE 10 MG PO TABS
10.0000 mg | ORAL_TABLET | Freq: Every day | ORAL | Status: DC
  Administered 2023-10-12 – 2023-10-20 (×8): 10 mg via ORAL
  Filled 2023-10-12 (×9): qty 1

## 2023-10-12 MED ORDER — HYDROCODONE-ACETAMINOPHEN 5-325 MG PO TABS
1.0000 | ORAL_TABLET | Freq: Four times a day (QID) | ORAL | Status: DC | PRN
  Administered 2023-10-14: 1 via ORAL
  Filled 2023-10-12: qty 1

## 2023-10-12 MED ORDER — ANASTROZOLE 1 MG PO TABS
1.0000 mg | ORAL_TABLET | Freq: Every day | ORAL | Status: DC
Start: 2023-10-12 — End: 2023-10-20
  Administered 2023-10-12 – 2023-10-20 (×7): 1 mg via ORAL
  Filled 2023-10-12 (×9): qty 1

## 2023-10-12 NOTE — H&P (Addendum)
 History and Physical    Patient: Brandi [name] DOB: 1941-01-08 DOA: 10/12/2023 DOS: the patient was seen and examined on 10/12/2023 . PCP: Addie Camellia CROME, MD  Patient coming from: Home Chief complaint: Chief Complaint  Patient presents with   Fall   HPI:  Brandi [name] is a 83 y.o. 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  Brought to the emergency room for pain in her left hip and knee after a fall at home yesterday.    ED Course:  Vital signs in the ED were notable for the following:  Vitals:   10/12/23 1300 10/12/23 1445 10/12/23 1730 10/12/23 1800  BP: (!) 144/86 128/76 137/73   Pulse: 67 76  72  Temp:      Resp: 16 19 17  (!) 27  Height:      Weight:      SpO2: 99% 97%  100%  TempSrc:      BMI (Calculated):      >>ED evaluation thus far shows: -EEG done in the ED today shows  cortical dysfunction arising from right temporal region likely secondary to underlying structural abnormality/ subdural hematoma. Additionally there is mild diffuse encephalopathy. No seizures or epileptiform discharges were seen throughout the recording.  -Initial BMP showing sodium 133 glucose 120 BUN of 33 creatinine 1.76 eGFR 28. - CBC showing hemoglobin of 9.1 MCV 100.7 normal platelets and white count. - Left x-ray of the ankle negative for fracture or malalignment. - Left hip x-ray negative for acute hip fracture or dislocation, chronic fracture of the proximal left femur and right superior and right inferior pubic rami. - Knee x-ray positive for acute fracture of the distal femur left knee replacement and a large joint effusion.   >>While in the ED patient received the following: Medications  fentaNYL  (SUBLIMAZE ) injection 25 mcg (25 mcg  Intravenous Given 10/12/23 1317)  acetaminophen  (TYLENOL ) tablet 1,000 mg (1,000 mg Oral Given 10/12/23 1317)  fentaNYL  (SUBLIMAZE ) injection 50 mcg (50 mcg Intravenous Given 10/12/23 1433)   Review of Systems  Musculoskeletal:  Positive for falls and joint pain.  Neurological:  Negative for loss of consciousness.   Past Medical History:  Diagnosis Date   Allergy    Atrial fibrillation (HCC)    Dysrhythmia    Hyperlipidemia    Hypertension    Neuromuscular disorder (HCC)    Venous insufficiency    Past Surgical History:  Procedure Laterality Date   ANKLE SURGERY Left    INTRAMEDULLARY (IM) NAIL INTERTROCHANTERIC Left 11/11/2022   Procedure: INTRAMEDULLARY (IM) NAIL INTERTROCHANTERIC;  Surgeon: Edna Toribio LABOR, MD;  Location: [facility];  Service: Orthopedics;  Laterality: Left;   KNEE SURGERY Left    VARICOSE VEIN SURGERY      reports that she has never smoked. She has never been exposed to tobacco smoke. She has never used smokeless tobacco. She reports that she does not drink alcohol  and does not use drugs. Allergies  Allergen Reactions   Amoxil [Amoxicillin] Diarrhea   Celebrex [Celecoxib] Other (See Comments)    Bruising and stomach pain   Keppra  [Levetiracetam ] Other (See Comments)    Somnolence   Family History  Problem Relation Age of Onset   Heart attack Father    Prior to Admission medications   Medication Sig Start Date End Date  Taking? Authorizing Provider  acetaminophen  (TYLENOL ) 500 MG tablet Take 1,000 mg by mouth every 6 (six) hours as needed for mild pain.    [provider]  anastrozole  (ARIMIDEX ) 1 MG tablet Take 1 tablet (1 mg total) by mouth daily. 09/14/23   Lanny Callander, MD  azelastine  (ASTELIN ) 0.1 % nasal spray Place 2 sprays into both nostrils daily as needed for rhinitis or allergies. 06/07/19   [provider]  cetirizine (ZYRTEC) 10 MG tablet Take 10 mg by mouth daily as needed for allergies.    [provider]   clotrimazole-betamethasone (LOTRISONE) cream Apply 1 Application topically as needed (rash). 01/05/23   [provider]  diltiazem  (CARDIZEM  SR) 60 MG 12 hr capsule Take 60 mg by mouth every 12 (twelve) hours.    [provider]  ferrous sulfate  325 (65 FE) MG tablet Take 325 mg by mouth daily with breakfast.    [provider]  guanFACINE  (TENEX ) 2 MG tablet Take 2 mg by mouth every evening. 09/19/21   [provider]  Lidocaine  HCl (ASPERCREME LIDOCAINE ) 4 % LIQD Apply 1 Application topically 2 (two) times daily as needed (knee pain).    [provider]  metoprolol  succinate (TOPROL -XL) 25 MG 24 hr tablet Take 25 mg by mouth 2 (two) times daily. 07/05/23   [provider]  Multiple Vitamins-Minerals (CENTRUM SILVER 50+WOMEN) TABS Take 1 tablet by mouth every evening.    [provider]  pantoprazole  (PROTONIX ) 40 MG tablet Take 1 tablet (40 mg total) by mouth daily. 07/12/21   Singh, Prashant K, MD  polyethylene glycol (MIRALAX  / GLYCOLAX ) 17 g packet Take 17 g by mouth daily. Patient taking differently: Take 17 g by mouth daily as needed for moderate constipation. 11/17/22   Raenelle Coria, MD  rosuvastatin  (CRESTOR ) 10 MG tablet Take 10 mg by mouth See admin instructions. Take 10 mg by mouth at bedtime on Sun/Wed only (twice a week) 10/10/19   [provider]  senna-docusate (SENOKOT-S) 8.6-50 MG tablet Take 1 tablet by mouth at bedtime as needed for mild constipation. 08/30/22   Lue Elsie BROCKS, MD  sertraline  (ZOLOFT ) 50 MG tablet Take 75 mg by mouth at bedtime.    [provider]  spironolactone  (ALDACTONE ) 25 MG tablet Take 25 mg by mouth daily. 04/15/23   [provider]  SYSTANE COMPLETE PF 0.6 % SOLN Place 1 drop into both eyes in the morning, at noon, and at bedtime.    [provider]  warfarin (JANTOVEN ) 2 MG tablet TAKE 1/2 TO 1 TABLET (1 TO 2MG ) DAILY OR AS DIRECTED  BY ANTICOAGULATION  CLINIC 03/26/23   Perla Evalene PARAS, MD                                                                                 Vitals:   10/12/23 1300 10/12/23 1445 10/12/23 1730 10/12/23 1800  BP: (!) 144/86 128/76 137/73   Pulse: 67 76  72  Resp: 16 19 17  (!) 27  Temp:      TempSrc:      SpO2: 99% 97%  100%  Weight:      Height:  Physical Exam Vitals reviewed.  Constitutional:      General: She is not in acute distress.    Appearance: She is obese. She is not ill-appearing.  HENT:     Head: Normocephalic.  Eyes:     Extraocular Movements: Extraocular movements intact.  Cardiovascular:     Rate and Rhythm: Normal rate. Rhythm irregular.     Heart sounds: Normal heart sounds.  Pulmonary:     Breath sounds: Normal breath sounds.  Abdominal:     General: There is no distension.     Palpations: Abdomen is soft.     Tenderness: There is no abdominal tenderness.  Neurological:     General: No focal deficit present.     Mental Status: She is alert and oriented to person, place, and time.     Labs on Admission: I have personally reviewed following labs and imaging studies CBC: Recent Labs  Lab 10/12/23 1314  WBC 9.1  NEUTROABS 7.3  HGB 9.1*  HCT 29.1*  MCV 100.7*  PLT 211   Basic Metabolic Panel: Recent Labs  Lab 10/12/23 1314  NA 133*  K 4.4  CL 101  CO2 22  GLUCOSE 120*  BUN 33*  CREATININE 1.76*  CALCIUM  8.9   GFR: Estimated Creatinine Clearance: 18.9 mL/min (A) (by C-G formula based on SCr of 1.76 mg/dL (H)). Liver Function Tests: No results for input(s): AST, ALT, ALKPHOS, BILITOT, PROT, ALBUMIN in the last 168 hours. No results for input(s): LIPASE, AMYLASE in the last 168 hours. No results for input(s): AMMONIA in the last 168 hours. Recent Labs    11/10/22 1629 11/11/22 0544 11/12/22 0347 11/13/22 0332 11/14/22 0349 07/28/23 1330 07/29/23 0255 07/30/23 0447 09/14/23 1303 10/12/23 1314  BUN 20 19 29* 32* 25* 41* 35* 24* 28*  33*  CREATININE 1.34* 1.28* 1.60* 1.78* 1.34* 1.93* 1.67* 1.52* 1.64* 1.76*    Cardiac Enzymes: No results for input(s): CKTOTAL, CKMB, CKMBINDEX, TROPONINI in the last 168 hours. BNP (last 3 results) No results for input(s): PROBNP in the last 8760 hours. HbA1C: No results for input(s): HGBA1C in the last 72 hours. CBG: No results for input(s): GLUCAP in the last 168 hours. Lipid Profile: No results for input(s): CHOL, HDL, LDLCALC, TRIG, CHOLHDL, LDLDIRECT in the last 72 hours. Thyroid  Function Tests: No results for input(s): TSH, T4TOTAL, FREET4, T3FREE, THYROIDAB in the last 72 hours. Anemia Panel: No results for input(s): VITAMINB12, FOLATE, FERRITIN, TIBC, IRON, RETICCTPCT in the last 72 hours. Urine analysis:    Component Value Date/Time   COLORURINE YELLOW 07/29/2023 0610   APPEARANCEUR HAZY (A) 07/29/2023 0610   LABSPEC 1.006 07/29/2023 0610   PHURINE 6.0 07/29/2023 0610   GLUCOSEU NEGATIVE 07/29/2023 0610   HGBUR NEGATIVE 07/29/2023 0610   BILIRUBINUR NEGATIVE 07/29/2023 0610   KETONESUR NEGATIVE 07/29/2023 0610   PROTEINUR NEGATIVE 07/29/2023 0610   NITRITE POSITIVE (A) 07/29/2023 0610   LEUKOCYTESUR MODERATE (A) 07/29/2023 0610   Radiological Exams on Admission: DG Hip Unilat W or Wo Pelvis 2-3 Views Left Result Date: 10/12/2023 CLINICAL DATA:  Status post fall. EXAM: DG HIP (WITH OR WITHOUT PELVIS) 2-3V LEFT COMPARISON:  November 11, 2022 FINDINGS: There is no evidence of an acute hip fracture or dislocation. A radiopaque intramedullary rod and compression screw device are seen within the proximal left femur. A chronic proximal left femoral fracture deformity is noted, with additional chronic fractures of the right superior and right inferior pubic rami. Stable degenerative changes are seen in the form of  joint space narrowing and acetabular sclerosis. IMPRESSION: 1. No acute fracture or dislocation. 2. Chronic fractures  of the proximal left femur and right superior and right inferior pubic rami. Electronically Signed   By: Suzen Dials M.D.   On: 10/12/2023 14:42   DG Knee Complete 4 Views Left Result Date: 10/12/2023 CLINICAL DATA:  Status post fall. 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 An acute, mildly displaced fracture is seen extending through the supracondylar regions of the distal left femur. This extends to the level of the femoral prosthesis. A radiopaque intramedullary rod and distal fixation screw are seen within the visualized left femoral shaft. There is no evidence of dislocation. A large joint effusion is noted. IMPRESSION: 1. Acute fracture of the distal left femur. 2. Left knee replacement. 3. Large joint effusion. Electronically Signed   By: Suzen Dials M.D.   On: 10/12/2023 14:38   DG Ankle Complete Left Result Date: 10/12/2023 CLINICAL DATA:  Left ankle pain after falling EXAM: LEFT ANKLE COMPLETE - 3 VIEW COMPARISON:  None Available. FINDINGS: Surgical changes of prior ORIF of a distal tibia fracture with 3 orthopedic pins in place. No evidence of acute fracture. Residual posttraumatic deformity present of the distal tibia and fibula. Osseous fusion of the syndesmotic ligament. 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 Are mild atherosclerotic calcifications in the posterior tibial artery. IMPRESSION: 1. No acute fracture or malalignment. 2. Evidence of remote prior distal tibia and fibular fracture status post ORIF of the distal tibial with ankylosis of the syndesmosis. 3. 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 Electronically Signed   By: Wilkie Lent M.D.   On: 10/12/2023 14:36   Data Reviewed: Relevant notes from primary care and specialist visits, past discharge summaries as available in EHR, including Care Everywhere . Prior diagnostic testing as pertinent  to current admission diagnoses, Updated medications and problem lists for reconciliation .ED course, including vitals, labs, imaging, treatment and response to treatment,Triage notes, nursing and pharmacy notes and ED provider's notes.Notable results as noted in HPI.Discussed case with EDMD/ ED APP/ or Specialty MD on call and as needed.  Assessment & Plan  >> Fall/ high fall risk: Will discussed with patient about falls and ongoing anticoagulation therapy and risk of bleeding as patient's had a history of subdural hematoma, will also defer to PCP to discuss with patient about the same.  Fall precautions.  >> Acute fracture of the left distal femur: Hold anticoagulation.  As needed pain control.  N.p.o. after midnight. Orthopedic consulted with tentative plan for surgery in a.m.  >> PAF currently in a.fib: EKG shows A-fib at 83 QRS of 74 QTc 432 no ST-T wave changes. CHA2DS2/VAS Stroke Risk Points  Current as of about an hour ago     5 >= 2 Points: High Risk  1 to 1.99 Points: Medium Risk  0 Points: Low Risk    Last Change: N/A   Hold Coumadin , for tentative ORIF of the left distal femur tomorrow Continue diltiazem  and hold metoprolol  and Aldactone .   >> CKD stage IIIb: Lab Results  Component Value Date   CREATININE 1.76 (H) 10/12/2023   CREATININE 1.64 (H) 09/14/2023   CREATININE 1.52 (H) 07/30/2023  Will renally dose and monitor and avoid contrast.   >> History of seizures: Secondary to history of subdural hematoma, seizure precautions fall and aspiration precaution. No AEDs in med list.    >> Essential  hypertension: Vitals:   10/12/23 1247 10/12/23 1300 10/12/23 1445 10/12/23 1730  BP: (!) 159/117 (!) 144/86 128/76 137/73  Blood pressure fluctuation noted.  Home regimen of diltiazem  60, metoprolol  25 mg Aldactone  25 mg. Currently we will continue patient on diltiazem  60 and hold Aldactone  and metoprolol .   DVT prophylaxis:  SCD Consults:  Orthopedic  Advance Care  Planning:    Code Status: Full Code   Family Communication:  None Disposition Plan:  To be determined Severity of Illness: The appropriate patient status for this patient is INPATIENT. Inpatient status is judged to be reasonable and necessary in order to provide the required intensity of service to ensure the patient's safety. The patient's presenting symptoms, physical exam findings, and initial radiographic and laboratory data in the context of their chronic comorbidities is felt to place them at high risk for further clinical deterioration. Furthermore, it is not anticipated that the patient will be medically stable for discharge from the hospital within 2 midnights of admission.   * I certify that at the point of admission it is my clinical judgment that the patient will require inpatient hospital care spanning beyond 2 midnights from the point of admission due to high intensity of service, high risk for further deterioration and high frequency of surveillance required.*  Unresulted Labs (From admission, onward)     Start     Ordered   10/13/23 0500  CBC  Tomorrow morning,   R        10/12/23 1634   10/13/23 0500  Basic metabolic panel  Tomorrow morning,   R        10/12/23 1634   10/13/23 0500  Hepatic function panel  Tomorrow morning,   R        10/12/23 1634   10/12/23 1630  Magnesium   Add-on,   AD        10/12/23 1634            Meds ordered this encounter  Medications   fentaNYL  (SUBLIMAZE ) injection 25 mcg   acetaminophen  (TYLENOL ) tablet 1,000 mg   fentaNYL  (SUBLIMAZE ) injection 50 mcg   anastrozole  (ARIMIDEX ) tablet 1 mg   loratadine  (CLARITIN ) tablet 10 mg   diltiazem  (CARDIZEM  SR) 12 hr capsule 60 mg   pantoprazole  (PROTONIX ) EC tablet 40 mg   rosuvastatin  (CRESTOR ) tablet 10 mg   sertraline  (ZOLOFT ) tablet 75 mg   HYDROcodone -acetaminophen  (NORCO/VICODIN) 5-325 MG per tablet 1-2 tablet    Refill:  0   morphine  (PF) 2 MG/ML injection 2 mg   OR Linked Order  Group    methocarbamol  (ROBAXIN ) tablet 500 mg    methocarbamol  (ROBAXIN ) injection 500 mg   docusate sodium  (COLACE) capsule 100 mg   polyethylene glycol (MIRALAX  / GLYCOLAX ) packet 17 g   bisacodyl  (DULCOLAX) EC tablet 5 mg     Orders Placed This Encounter  Procedures   DG Ankle Complete Left   DG Knee Complete 4 Views Left   DG Hip Unilat W or Wo Pelvis 2-3 Views Left   CBC with Differential   Basic metabolic panel   CBC   Basic metabolic panel   Hepatic function panel   Magnesium    Diet NPO time specified Except for: Sips with Meds, Ice Chips   Apply knee immobilizer   Maintain Knee Immobilizer   SCDs   Vital signs every hour x 4, then every 4 hours   Neurovascular checks every hour x 4, then every 4 hours   Bed with an  overhead patient helper or overhead frame with a trapeze bar   Apply ice to affected area   Elevate heels off of bed   Turn cough deep breathe   Incentive spirometry   Bed rest with HOB to 30 degrees   Intake and output   If diabetic or glucose greater than 140mg /dl, notify physician to place Gylcemic Control (SSI) Order Set   Initiate Oral Care Protocol   Initiate Carrier Fluid Protocol   Document Pasero Opioid-Induced Sedation Scale (POSS) per protocol (see sidebar report)   Cardiac Monitoring - Continuous Indefinite   Full code   Consult to hospitalist   Consult to Transition of Care Team   Consult to Registered Dietitian   Consult to anesthesiology for nerve block Laterality: Left; Nerve block will be performed in PACU.   Oxygen therapy Mode or (Route): Nasal cannula; Liters Per Minute: 2   Pulse oximetry, continuous   Pulse oximetry, every 4 hours   ED EKG   Admit to Inpatient (patient's expected length of stay will be greater than 2 midnights or inpatient only procedure)    Author: Mario LULLA Blanch, MD 12 pm- 8 pm. [facility]. 10/12/2023 7:16 PM Please note for any communication after hours contact [facility] Assigned provider on call on  Amion.

## 2023-10-12 NOTE — ED Triage Notes (Signed)
 Patient tripped over her dog yesterday and was unable to get up by herself. Her son lifted her into the bed and she woke this morning with L knee and L hip pain. On Warfarin, did not hit head. A/O x 4.

## 2023-10-12 NOTE — ED Notes (Signed)
 Pt O2 decreased to 88% RA after fentanyl . Pt placed on 2L [place]

## 2023-10-12 NOTE — ED Provider Notes (Cosign Needed Addendum)
 [facility] AT [facility] Provider Note   CSN: [id number] Arrival date & time: 10/12/23  1230     Patient presents with: Brandi [name] Brandi [name] is a 83 y.o. female.   83 year old female presents today for concern of a fall that occurred yesterday.  She states she tripped over an area rug as she was taking out her dog.  She was unable to bear weight since the fall.  Her son lifted her into the bed last night.  limited range of motion secondary to pain.  She is on warfarin.  She is certain that she did not hit her head.  Did not pass out.  Does not want head imaging.  The history is provided by the patient. No language interpreter was used.       Prior to Admission medications   Medication Sig Start Date End Date Taking? Authorizing Provider  acetaminophen  (TYLENOL ) 500 MG tablet Take 1,000 mg by mouth every 6 (six) hours as needed for mild pain.    [provider]  anastrozole  (ARIMIDEX ) 1 MG tablet Take 1 tablet (1 mg total) by mouth daily. 09/14/23   Lanny Callander, MD  azelastine  (ASTELIN ) 0.1 % nasal spray Place 2 sprays into both nostrils daily as needed for rhinitis or allergies. 06/07/19   [provider]  cetirizine (ZYRTEC) 10 MG tablet Take 10 mg by mouth daily as needed for allergies.    [provider]  clotrimazole-betamethasone (LOTRISONE) cream Apply 1 Application topically as needed (rash). 01/05/23   [provider]  diltiazem  (CARDIZEM  SR) 60 MG 12 hr capsule Take 60 mg by mouth every 12 (twelve) hours.    [provider]  ferrous sulfate  325 (65 FE) MG tablet Take 325 mg by mouth daily with breakfast.    [provider]  guanFACINE  (TENEX ) 2 MG tablet Take 2 mg by mouth every evening. 09/19/21   [provider]  Lidocaine  HCl (ASPERCREME LIDOCAINE ) 4 % LIQD Apply 1 Application topically 2 (two) times daily as needed (knee pain).    [provider]  metoprolol  succinate  (TOPROL -XL) 25 MG 24 hr tablet Take 25 mg by mouth 2 (two) times daily. 07/05/23   [provider]  Multiple Vitamins-Minerals (CENTRUM SILVER 50+WOMEN) TABS Take 1 tablet by mouth every evening.    [provider]  pantoprazole  (PROTONIX ) 40 MG tablet Take 1 tablet (40 mg total) by mouth daily. 07/12/21   Singh, Prashant K, MD  polyethylene glycol (MIRALAX  / GLYCOLAX ) 17 g packet Take 17 g by mouth daily. Patient taking differently: Take 17 g by mouth daily as needed for moderate constipation. 11/17/22   Ghimire, Kuber, MD  rosuvastatin  (CRESTOR ) 10 MG tablet Take 10 mg by mouth See admin instructions. Take 10 mg by mouth at bedtime on Sun/Wed only (twice a week) 10/10/19   [provider]  senna-docusate (SENOKOT-S) 8.6-50 MG tablet Take 1 tablet by mouth at bedtime as needed for mild constipation. 08/30/22   Brandi Elsie BROCKS, MD  sertraline  (ZOLOFT ) 50 MG tablet Take 75 mg by mouth at bedtime.    [provider]  spironolactone  (ALDACTONE ) 25 MG tablet Take 25 mg by mouth daily. 04/15/23   [provider]  SYSTANE COMPLETE PF 0.6 % SOLN Place 1 drop into both eyes in the morning, at noon, and at bedtime.    [provider]  warfarin (JANTOVEN ) 2 MG tablet TAKE 1/2 TO 1 TABLET (1 TO 2MG ) DAILY  OR AS DIRECTED  BY ANTICOAGULATION CLINIC 03/26/23   Gollan, Timothy J, MD    Allergies: Amoxil [amoxicillin], Celebrex [celecoxib], and Keppra  [levetiracetam ]    Review of Systems  Constitutional:  Negative for fever.  Musculoskeletal:  Positive for arthralgias. Negative for joint swelling.  Skin:  Negative for wound.  Neurological:  Negative for syncope, light-headedness and headaches.  All other systems reviewed and are negative.   Updated Vital Signs BP (!) 159/117 (BP Location: Left Arm)   Pulse 86   Temp 97.9 F (36.6 C) (Oral)   Resp 18   Ht 4' 11 (1.499 m)   Wt 59 kg   SpO2 100%   BMI 26.26 kg/m   Physical Exam Vitals and nursing  note reviewed.  Constitutional:      General: She is not in acute distress.    Appearance: Normal appearance. She is not ill-appearing.  HENT:     Head: Normocephalic and atraumatic.     Nose: Nose normal.  Eyes:     Conjunctiva/sclera: Conjunctivae normal.  Pulmonary:     Effort: Pulmonary effort is normal. No respiratory distress.  Musculoskeletal:        General: No deformity.     Comments: Bilateral upper extremities with good range of motion and good strength.  No tenderness to palpation to all joints in bilateral upper extremities.  Spine is well aligned and without tenderness to palpation.  Some tenderness to palpation over the left hip, especially with hip flexion.  Left knee and left ankle, left tib-fib, left femur and left foot without tenderness to palpation or obvious bruising, or deformity.  Minimal flexion in the left knee.  Further flexion limited by pain.  Left ankle with good dorsiflexion and plantarflexion.  Neurovascularly intact in bilateral lower extremities.  Right lower extremity with good range of motion and ankle, knee, and hip.  Skin:    Findings: No rash.  Neurological:     Mental Status: She is alert.     (all labs ordered are listed, but only abnormal results are displayed) Labs Reviewed  CBC WITH DIFFERENTIAL/PLATELET  BASIC METABOLIC PANEL WITH GFR    EKG: None  Radiology: No results found.   Procedures   Medications Ordered in the ED  fentaNYL  (SUBLIMAZE ) injection 25 mcg (has no administration in time range)  acetaminophen  (TYLENOL ) tablet 1,000 mg (has no administration in time range)                                    Medical Decision Making Amount and/or Complexity of Data Reviewed Labs: ordered. Radiology: ordered.  Risk OTC drugs. Prescription drug management. Decision regarding hospitalization.   Medical Decision Making / ED Course   This patient presents to the ED for concern of fall, this involves an extensive number  of treatment options, and is a complaint that carries with it a high risk of complications and morbidity.  The differential diagnosis includes fracture, contusion, head injury  MDM: 83 year old female presents after a fall that occurred yesterday.  Not able to bear weight since. Denies head injury.  We discussed the importance of ruling out head injury and intracranial bleed given she is on Coumadin  however she declines.  She states she is certain she did not hit her head.  Only complains of pain to her left leg.  Limited range of motion.  X-rays ordered.  Pain control provided.  X-rays show acute  distal femur fracture on the left. Discussed with Ozell Purchase of orthopedic group. Recommends patient will need surgery and should be admitted to medicine service.  Spoke with patient's husband who is aware.  Patient is aware.     Additional history obtained: -Additional history obtained from husband over the phone -External records from outside source obtained and reviewed including: Chart review including previous notes, labs, imaging, consultation notes   Lab Tests: -I ordered, reviewed, and interpreted labs.   The pertinent results include:   Labs Reviewed  CBC WITH DIFFERENTIAL/PLATELET - Abnormal; Notable for the following components:      Result Value   RBC 2.89 (*)    Hemoglobin 9.1 (*)    HCT 29.1 (*)    MCV 100.7 (*)    All other components within normal limits  BASIC METABOLIC PANEL WITH GFR - Abnormal; Notable for the following components:   Sodium 133 (*)    Glucose, Bld 120 (*)    BUN 33 (*)    Creatinine, Ser 1.76 (*)    GFR, Estimated 28 (*)    All other components within normal limits      EKG  EKG Interpretation Date/Time:    Ventricular Rate:    PR Interval:    QRS Duration:    QT Interval:    QTC Calculation:   R Axis:      Text Interpretation:           Imaging Studies ordered: I ordered imaging studies including left hip x-ray, left knee  x-ray, left ankle x-ray I independently visualized and interpreted imaging. I agree with the radiologist interpretation   Medicines ordered and prescription drug management: Meds ordered this encounter  Medications   fentaNYL  (SUBLIMAZE ) injection 25 mcg   acetaminophen  (TYLENOL ) tablet 1,000 mg   fentaNYL  (SUBLIMAZE ) injection 50 mcg    -I have reviewed the patients home medicines and have made adjustments as needed  Reevaluation: After the interventions noted above, I reevaluated the patient and found that they have :stayed the same  Co morbidities that complicate the patient evaluation  Past Medical History:  Diagnosis Date   Allergy    Atrial fibrillation (HCC)    Dysrhythmia    Hyperlipidemia    Hypertension    Neuromuscular disorder (HCC)    Venous insufficiency       Dispostion: Spoke with hospitalist will evaluate patient for admission  Final diagnoses:  Closed fracture of distal end of left femur, unspecified fracture morphology, initial encounter [facility])    ED Discharge Orders     None          Hildegard Loge, PA-C 10/12/23 1510    Hildegard Loge, PA-C 10/12/23 1525    Brandi Longs, MD 10/13/23 1029

## 2023-10-12 NOTE — ED Notes (Signed)
 Ortho tech called

## 2023-10-12 NOTE — Hospital Course (Signed)
 SABRA

## 2023-10-12 NOTE — H&P (View-Only) (Signed)
 Reason for Consult:Left distal femur fx Referring Physician: Alm Cave Time called: 1457 Time at bedside: 1532   Brandi [name] is an 83 y.o. female.  HPI: Camreigh tripped on a rug at home and fell yesterday. She had immediate left knee pain and could not get up. She was helped to bed by her husband and son who she lives with. When she was still hurting this morning she was brought to the ED where x-rays showed a left periprosthetic distal femur fx and orthopedic surgery was consulted. She uses both a RW and cane for ambulation and has a recent hx/o frequent falls.  Past Medical History:  Diagnosis Date   Allergy    Atrial fibrillation (HCC)    Dysrhythmia    Hyperlipidemia    Hypertension    Neuromuscular disorder (HCC)    Venous insufficiency     Past Surgical History:  Procedure Laterality Date   ANKLE SURGERY Left    INTRAMEDULLARY (IM) NAIL INTERTROCHANTERIC Left 11/11/2022   Procedure: INTRAMEDULLARY (IM) NAIL INTERTROCHANTERIC;  Surgeon: Edna Toribio LABOR, MD;  Location: [facility] ORS;  Service: Orthopedics;  Laterality: Left;   KNEE SURGERY Left    VARICOSE VEIN SURGERY      Family History  Problem Relation Age of Onset   Heart attack Father     Social History:  reports that she has never smoked. She has never been exposed to tobacco smoke. She has never used smokeless tobacco. She reports that she does not drink alcohol  and does not use drugs.  Allergies:  Allergies  Allergen Reactions   Amoxil [Amoxicillin] Diarrhea   Celebrex [Celecoxib] Other (See Comments)    Bruising and stomach pain   Keppra  [Levetiracetam ] Other (See Comments)    Somnolence    Medications: I have reviewed the patient's current medications.  Results for orders placed or performed during the hospital encounter of 10/12/23 (from the past 48 hours)  CBC with Differential     Status: Abnormal   Collection Time: 10/12/23  1:14 PM  Result Value Ref Range   WBC 9.1 4.0 - 10.5 K/uL   RBC 2.89  (L) 3.87 - 5.11 MIL/uL   Hemoglobin 9.1 (L) 12.0 - 15.0 g/dL   HCT 70.8 (L) 63.9 - 53.9 %   MCV 100.7 (H) 80.0 - 100.0 fL   MCH 31.5 26.0 - 34.0 pg   MCHC 31.3 30.0 - 36.0 g/dL   RDW 87.0 88.4 - 84.4 %   Platelets 211 150 - 400 K/uL   nRBC 0.0 0.0 - 0.2 %   Neutrophils Relative % 79 %   Neutro Abs 7.3 1.7 - 7.7 K/uL   Lymphocytes Relative 12 %   Lymphs Abs 1.1 0.7 - 4.0 K/uL   Monocytes Relative 7 %   Monocytes Absolute 0.6 0.1 - 1.0 K/uL   Eosinophils Relative 1 %   Eosinophils Absolute 0.1 0.0 - 0.5 K/uL   Basophils Relative 0 %   Basophils Absolute 0.0 0.0 - 0.1 K/uL   Immature Granulocytes 1 %   Abs Immature Granulocytes 0.05 0.00 - 0.07 K/uL    Comment: Performed at [facility], [address], [place], [place] [address]  Basic metabolic panel     Status: Abnormal   Collection Time: 10/12/23  1:14 PM  Result Value Ref Range   Sodium 133 (L) 135 - 145 mmol/L   Potassium 4.4 3.5 - 5.1 mmol/L   Chloride 101 98 - 111 mmol/L   CO2 22 22 -  32 mmol/L   Glucose, Bld 120 (H) 70 - 99 mg/dL    Comment: Glucose reference range applies only to samples taken after fasting for at least 8 hours.   BUN 33 (H) 8 - 23 mg/dL   Creatinine, Ser 8.23 (H) 0.44 - 1.00 mg/dL   Calcium  8.9 8.9 - 10.3 mg/dL   GFR, Estimated 28 (L) >60 mL/min    Comment: (NOTE) Calculated using the CKD-EPI Creatinine Equation (2021)    Anion gap 10 5 - 15    Comment: Performed at [facility], [address], [place], [place] [address]    DG Hip Burnis ORN or Wo Pelvis 2-3 Views Left Result Date: 10/12/2023 CLINICAL DATA:  Status post fall. EXAM: DG HIP (WITH OR WITHOUT PELVIS) 2-3V LEFT COMPARISON:  November 11, 2022 FINDINGS: There is no evidence of an acute hip fracture or dislocation. A radiopaque intramedullary rod and compression screw device are seen within the proximal left femur. A chronic proximal left femoral fracture deformity is noted, with additional chronic fractures of the right superior  and right inferior pubic rami. Stable degenerative changes are seen in the form of joint space narrowing and acetabular sclerosis. IMPRESSION: 1. No acute fracture or dislocation. 2. Chronic fractures of the proximal left femur and right superior and right inferior pubic rami. Electronically Signed   By: Suzen Dials M.D.   On: 10/12/2023 14:42   DG Knee Complete 4 Views Left Result Date: 10/12/2023 CLINICAL DATA:  Status post fall. 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 An acute, mildly displaced fracture is seen extending through the supracondylar regions of the distal left femur. This extends to the level of the femoral prosthesis. A radiopaque intramedullary rod and distal fixation screw are seen within the visualized left femoral shaft. There is no evidence of dislocation. A large joint effusion is noted. IMPRESSION: 1. Acute fracture of the distal left femur. 2. Left knee replacement. 3. Large joint effusion. Electronically Signed   By: Suzen Dials M.D.   On: 10/12/2023 14:38   DG Ankle Complete Left Result Date: 10/12/2023 CLINICAL DATA:  Left ankle pain after falling EXAM: LEFT ANKLE COMPLETE - 3 VIEW COMPARISON:  None Available. FINDINGS: Surgical changes of prior ORIF of a distal tibia fracture with 3 orthopedic pins in place. No evidence of acute fracture. Residual posttraumatic deformity present of the distal tibia and fibula. Osseous fusion of the syndesmotic ligament. 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 Are mild atherosclerotic calcifications in the posterior tibial artery. IMPRESSION: 1. No acute fracture or malalignment. 2. Evidence of remote prior distal tibia and fibular fracture status post ORIF of the distal tibial with ankylosis of the syndesmosis. 3. 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 Electronically Signed   By: Wilkie Lent M.D.   On:  10/12/2023 14:36    Review of Systems  HENT:  Negative for ear discharge, ear pain, hearing loss and tinnitus.   Eyes:  Negative for photophobia and pain.  Respiratory:  Negative for cough and shortness of breath.   Cardiovascular:  Negative for chest pain.  Gastrointestinal:  Negative for abdominal pain, nausea and vomiting.  Genitourinary:  Negative for dysuria, flank pain, frequency and urgency.  Musculoskeletal:  Positive for arthralgias (Left knee). Negative for back pain, myalgias and neck pain.  Neurological:  Negative for dizziness and headaches.  Hematological:  Does not bruise/bleed easily.  Psychiatric/Behavioral:  The  patient is not nervous/anxious.    Blood pressure 128/76, pulse 76, temperature 97.9 F (36.6 C), temperature source Oral, resp. rate 19, height 4' 11 (1.499 m), weight 59 kg, SpO2 97%. Physical Exam Constitutional:      General: She is not in acute distress.    Appearance: She is well-developed. She is not diaphoretic.  HENT:     Head: Normocephalic and atraumatic.  Eyes:     General: No scleral icterus.       Right eye: No discharge.        Left eye: No discharge.     Conjunctiva/sclera: Conjunctivae normal.  Cardiovascular:     Rate and Rhythm: Normal rate and regular rhythm.  Pulmonary:     Effort: Pulmonary effort is normal. No respiratory distress.  Musculoskeletal:     Cervical back: Normal range of motion.     Comments: LLE No traumatic wounds, ecchymosis, or rash  Mod TTP knee  No ankle effusion  Sens DPN, SPN, TN intact  Motor EHL, ext, flex, evers 5/5  DP 0, PT 0, No significant edema  Skin:    General: Skin is warm and dry.  Neurological:     Mental Status: She is alert.  Psychiatric:        Mood and Affect: Mood normal.        Behavior: Behavior normal.     Assessment/Plan: Left distal femur fx -- Plan ORIF tomorrow with Dr. Kendal. Please keep NPO after MN.    Ozell DOROTHA Ned, PA-C Orthopedic  Surgery [phone] 10/12/2023, 3:45 PM

## 2023-10-12 NOTE — Consult Note (Signed)
 Reason for Consult:Left distal femur fx Referring Physician: Alm Cave Time called: 1457 Time at bedside: 1532   Brandi [name] is an 83 y.o. female.  HPI: Brandi [name] tripped on a rug at home and fell yesterday. She had immediate left knee pain and could not get up. She was helped to bed by her husband and son who she lives with. When she was still hurting this morning she was brought to the ED where x-rays showed a left periprosthetic distal femur fx and orthopedic surgery was consulted. She uses both a RW and cane for ambulation and has a recent hx/o frequent falls.  Past Medical History:  Diagnosis Date   Allergy    Atrial fibrillation (HCC)    Dysrhythmia    Hyperlipidemia    Hypertension    Neuromuscular disorder (HCC)    Venous insufficiency     Past Surgical History:  Procedure Laterality Date   ANKLE SURGERY Left    INTRAMEDULLARY (IM) NAIL INTERTROCHANTERIC Left 11/11/2022   Procedure: INTRAMEDULLARY (IM) NAIL INTERTROCHANTERIC;  Surgeon: Edna Toribio LABOR, MD;  Location: [facility];  Service: Orthopedics;  Laterality: Left;   KNEE SURGERY Left    VARICOSE VEIN SURGERY      Family History  Problem Relation Age of Onset   Heart attack Father     Social History:  reports that she has never smoked. She has never been exposed to tobacco smoke. She has never used smokeless tobacco. She reports that she does not drink alcohol  and does not use drugs.  Allergies:  Allergies  Allergen Reactions   Amoxil [Amoxicillin] Diarrhea   Celebrex [Celecoxib] Other (See Comments)    Bruising and stomach pain   Keppra  [Levetiracetam ] Other (See Comments)    Somnolence    Medications: I have reviewed the patient's current medications.  Results for orders placed or performed during the hospital encounter of 10/12/23 (from the past 48 hours)  CBC with Differential     Status: Abnormal   Collection Time: 10/12/23  1:14 PM  Result Value Ref Range   WBC 9.1 4.0 - 10.5 K/uL   RBC 2.89  (L) 3.87 - 5.11 MIL/uL   Hemoglobin 9.1 (L) 12.0 - 15.0 g/dL   HCT 70.8 (L) 63.9 - 53.9 %   MCV 100.7 (H) 80.0 - 100.0 fL   MCH 31.5 26.0 - 34.0 pg   MCHC 31.3 30.0 - 36.0 g/dL   RDW 87.0 88.4 - 84.4 %   Platelets 211 150 - 400 K/uL   nRBC 0.0 0.0 - 0.2 %   Neutrophils Relative % 79 %   Neutro Abs 7.3 1.7 - 7.7 K/uL   Lymphocytes Relative 12 %   Lymphs Abs 1.1 0.7 - 4.0 K/uL   Monocytes Relative 7 %   Monocytes Absolute 0.6 0.1 - 1.0 K/uL   Eosinophils Relative 1 %   Eosinophils Absolute 0.1 0.0 - 0.5 K/uL   Basophils Relative 0 %   Basophils Absolute 0.0 0.0 - 0.1 K/uL   Immature Granulocytes 1 %   Abs Immature Granulocytes 0.05 0.00 - 0.07 K/uL    Comment: Performed at [facility], [address], [place], [place] [address]  Basic metabolic panel     Status: Abnormal   Collection Time: 10/12/23  1:14 PM  Result Value Ref Range   Sodium 133 (L) 135 - 145 mmol/L   Potassium 4.4 3.5 - 5.1 mmol/L   Chloride 101 98 - 111 mmol/L   CO2 22 22 -  32 mmol/L   Glucose, Bld 120 (H) 70 - 99 mg/dL    Comment: Glucose reference range applies only to samples taken after fasting for at least 8 hours.   BUN 33 (H) 8 - 23 mg/dL   Creatinine, Ser 8.23 (H) 0.44 - 1.00 mg/dL   Calcium  8.9 8.9 - 10.3 mg/dL   GFR, Estimated 28 (L) >60 mL/min    Comment: (NOTE) Calculated using the CKD-EPI Creatinine Equation (2021)    Anion gap 10 5 - 15    Comment: Performed at [facility], [address], [place], [place] [address]    DG Hip Burnis ORN or Wo Pelvis 2-3 Views Left Result Date: 10/12/2023 CLINICAL DATA:  Status post fall. EXAM: DG HIP (WITH OR WITHOUT PELVIS) 2-3V LEFT COMPARISON:  November 11, 2022 FINDINGS: There is no evidence of an acute hip fracture or dislocation. A radiopaque intramedullary rod and compression screw device are seen within the proximal left femur. A chronic proximal left femoral fracture deformity is noted, with additional chronic fractures of the right superior  and right inferior pubic rami. Stable degenerative changes are seen in the form of joint space narrowing and acetabular sclerosis. IMPRESSION: 1. No acute fracture or dislocation. 2. Chronic fractures of the proximal left femur and right superior and right inferior pubic rami. Electronically Signed   By: Suzen Dials M.D.   On: 10/12/2023 14:42   DG Knee Complete 4 Views Left Result Date: 10/12/2023 CLINICAL DATA:  Status post fall. 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 An acute, mildly displaced fracture is seen extending through the supracondylar regions of the distal left femur. This extends to the level of the femoral prosthesis. A radiopaque intramedullary rod and distal fixation screw are seen within the visualized left femoral shaft. There is no evidence of dislocation. A large joint effusion is noted. IMPRESSION: 1. Acute fracture of the distal left femur. 2. Left knee replacement. 3. Large joint effusion. Electronically Signed   By: Suzen Dials M.D.   On: 10/12/2023 14:38   DG Ankle Complete Left Result Date: 10/12/2023 CLINICAL DATA:  Left ankle pain after falling EXAM: LEFT ANKLE COMPLETE - 3 VIEW COMPARISON:  None Available. FINDINGS: Surgical changes of prior ORIF of a distal tibia fracture with 3 orthopedic pins in place. No evidence of acute fracture. Residual posttraumatic deformity present of the distal tibia and fibula. Osseous fusion of the syndesmotic ligament. 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 Are mild atherosclerotic calcifications in the posterior tibial artery. IMPRESSION: 1. No acute fracture or malalignment. 2. Evidence of remote prior distal tibia and fibular fracture status post ORIF of the distal tibial with ankylosis of the syndesmosis. 3. 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 Electronically Signed   By: Wilkie Lent M.D.   On:  10/12/2023 14:36    Review of Systems  HENT:  Negative for ear discharge, ear pain, hearing loss and tinnitus.   Eyes:  Negative for photophobia and pain.  Respiratory:  Negative for cough and shortness of breath.   Cardiovascular:  Negative for chest pain.  Gastrointestinal:  Negative for abdominal pain, nausea and vomiting.  Genitourinary:  Negative for dysuria, flank pain, frequency and urgency.  Musculoskeletal:  Positive for arthralgias (Left knee). Negative for back pain, myalgias and neck pain.  Neurological:  Negative for dizziness and headaches.  Hematological:  Does not bruise/bleed easily.  Psychiatric/Behavioral:  The  patient is not nervous/anxious.    Blood pressure 128/76, pulse 76, temperature 97.9 F (36.6 C), temperature source Oral, resp. rate 19, height 4' 11 (1.499 m), weight 59 kg, SpO2 97%. Physical Exam Constitutional:      General: She is not in acute distress.    Appearance: She is well-developed. She is not diaphoretic.  HENT:     Head: Normocephalic and atraumatic.  Eyes:     General: No scleral icterus.       Right eye: No discharge.        Left eye: No discharge.     Conjunctiva/sclera: Conjunctivae normal.  Cardiovascular:     Rate and Rhythm: Normal rate and regular rhythm.  Pulmonary:     Effort: Pulmonary effort is normal. No respiratory distress.  Musculoskeletal:     Cervical back: Normal range of motion.     Comments: LLE No traumatic wounds, ecchymosis, or rash  Mod TTP knee  No ankle effusion  Sens DPN, SPN, TN intact  Motor EHL, ext, flex, evers 5/5  DP 0, PT 0, No significant edema  Skin:    General: Skin is warm and dry.  Neurological:     Mental Status: She is alert.  Psychiatric:        Mood and Affect: Mood normal.        Behavior: Behavior normal.     Assessment/Plan: Left distal femur fx -- Plan ORIF tomorrow with Dr. Kendal. Please keep NPO after MN.    Ozell DOROTHA Ned, PA-C Orthopedic  Surgery [phone] 10/12/2023, 3:45 PM

## 2023-10-13 ENCOUNTER — Inpatient Hospital Stay (HOSPITAL_COMMUNITY)

## 2023-10-13 ENCOUNTER — Encounter (HOSPITAL_COMMUNITY): Payer: Self-pay | Admitting: Anesthesiology

## 2023-10-13 ENCOUNTER — Encounter (HOSPITAL_COMMUNITY)
Admission: EM | Disposition: A | Discharge status: Skilled Nursing Facility | Payer: Self-pay | Source: Home / Self Care | Attending: Family Medicine

## 2023-10-13 ENCOUNTER — Encounter (HOSPITAL_COMMUNITY): Payer: Self-pay | Admitting: Internal Medicine

## 2023-10-13 DIAGNOSIS — I480 Paroxysmal atrial fibrillation: Secondary | ICD-10-CM

## 2023-10-13 DIAGNOSIS — Z01810 Encounter for preprocedural cardiovascular examination: Secondary | ICD-10-CM

## 2023-10-13 DIAGNOSIS — Y92009 Unspecified place in unspecified non-institutional (private) residence as the place of occurrence of the external cause: Secondary | ICD-10-CM | POA: Diagnosis not present

## 2023-10-13 DIAGNOSIS — E785 Hyperlipidemia, unspecified: Secondary | ICD-10-CM | POA: Diagnosis not present

## 2023-10-13 DIAGNOSIS — W19XXXA Unspecified fall, initial encounter: Secondary | ICD-10-CM | POA: Diagnosis not present

## 2023-10-13 LAB — HEPATIC FUNCTION PANEL
ALT: 13 U/L (ref 0–44)
AST: 19 U/L (ref 15–41)
Albumin: 3.3 g/dL — ABNORMAL LOW (ref 3.5–5.0)
Alkaline Phosphatase: 77 U/L (ref 38–126)
Bilirubin, Direct: 0.2 mg/dL (ref 0.0–0.2)
Indirect Bilirubin: 0.7 mg/dL (ref 0.3–0.9)
Total Bilirubin: 0.9 mg/dL (ref 0.0–1.2)
Total Protein: 6.2 g/dL — ABNORMAL LOW (ref 6.5–8.1)

## 2023-10-13 LAB — BASIC METABOLIC PANEL WITH GFR
Anion gap: 10 (ref 5–15)
BUN: 29 mg/dL — ABNORMAL HIGH (ref 8–23)
CO2: 23 mmol/L (ref 22–32)
Calcium: 9 mg/dL (ref 8.9–10.3)
Chloride: 101 mmol/L (ref 98–111)
Creatinine, Ser: 1.59 mg/dL — ABNORMAL HIGH (ref 0.44–1.00)
GFR, Estimated: 32 mL/min — ABNORMAL LOW (ref >60)
Glucose, Bld: 128 mg/dL — ABNORMAL HIGH (ref 70–99)
Potassium: 4.5 mmol/L (ref 3.5–5.1)
Sodium: 134 mmol/L — ABNORMAL LOW (ref 135–145)

## 2023-10-13 LAB — CBC
HCT: 28 % — ABNORMAL LOW (ref 36.0–46.0)
Hemoglobin: 8.9 g/dL — ABNORMAL LOW (ref 12.0–15.0)
MCH: 31 pg (ref 26.0–34.0)
MCHC: 31.8 g/dL (ref 30.0–36.0)
MCV: 97.6 fL (ref 80.0–100.0)
Platelets: 196 K/uL (ref 150–400)
RBC: 2.87 MIL/uL — ABNORMAL LOW (ref 3.87–5.11)
RDW: 13 % (ref 11.5–15.5)
WBC: 9.9 K/uL (ref 4.0–10.5)
nRBC: 0 % (ref 0.0–0.2)

## 2023-10-13 LAB — SURGICAL PCR SCREEN
MRSA, PCR: NEGATIVE
Staphylococcus aureus: NEGATIVE

## 2023-10-13 LAB — PROTIME-INR
INR: 2 — ABNORMAL HIGH (ref 0.8–1.2)
Prothrombin Time: 23.2 s — ABNORMAL HIGH (ref 11.4–15.2)

## 2023-10-13 SURGERY — OPEN REDUCTION INTERNAL FIXATION (ORIF) DISTAL FEMUR FRACTURE
Anesthesia: General | Laterality: Left

## 2023-10-13 MED ORDER — METOPROLOL SUCCINATE ER 25 MG PO TB24
25.0000 mg | ORAL_TABLET | Freq: Two times a day (BID) | ORAL | Status: DC
  Administered 2023-10-13 – 2023-10-20 (×14): 25 mg via ORAL
  Filled 2023-10-13 (×15): qty 1

## 2023-10-13 MED ORDER — DILTIAZEM LOAD VIA INFUSION
10.0000 mg | Freq: Once | INTRAVENOUS | Status: AC
  Administered 2023-10-13: 10 mg via INTRAVENOUS
  Filled 2023-10-13: qty 10

## 2023-10-13 MED ORDER — CHLORHEXIDINE GLUCONATE 4 % EX SOLN
60.0000 mL | Freq: Once | CUTANEOUS | Status: AC
  Administered 2023-10-14: 4 via TOPICAL
  Filled 2023-10-13: qty 60

## 2023-10-13 MED ORDER — METOPROLOL TARTRATE 5 MG/5ML IV SOLN
2.5000 mg | Freq: Three times a day (TID) | INTRAVENOUS | Status: DC | PRN
  Administered 2023-10-13: 2.5 mg via INTRAVENOUS
  Filled 2023-10-13: qty 5

## 2023-10-13 MED ORDER — ORAL CARE MOUTH RINSE: 15.0000 mL | OROMUCOSAL | Status: DC | PRN

## 2023-10-13 MED ORDER — DILTIAZEM HCL-DEXTROSE 125-5 MG/125ML-% IV SOLN (PREMIX)
5.0000 mg/h | INTRAVENOUS | Status: DC
Start: 2023-10-13 — End: 2023-10-16
  Administered 2023-10-13: 15 mg/h via INTRAVENOUS
  Administered 2023-10-13: 5 mg/h via INTRAVENOUS
  Administered 2023-10-14 – 2023-10-15 (×2): 15 mg/h via INTRAVENOUS
  Administered 2023-10-15: 5 mg/h via INTRAVENOUS
  Filled 2023-10-13 (×6): qty 125

## 2023-10-13 NOTE — Progress Notes (Signed)
 Per ortho tech, due to patients age, not eligible for a trapeze bar.

## 2023-10-13 NOTE — Progress Notes (Signed)
 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  X-ray of the left hip negative for acute fracture or dislocation.  X-ray of the left knee shows acute fracture of distal left femur, orthopedics  consulted.  Patient also found to have A-fib with RVR started on Cardizem  gtt.  Cardiology consulted.  Assessment & Plan:   Principal Problem:   Fall at home, initial encounter  Acute fracture of left distal femur: Status post fall: Patient presented status post mechanical fall at home. X-ray left knee shows acute fracture of distal left femur. Orthopedics is consulted. Continue adequate pain control. Hold anticoagulation. Patient was tentatively scheduled for today, rescheduled for tomorrow.  Paroxysmal A-fib with RVR: EKG shows A-fib with RVR. Coumadin  is on hold. Patient started on Cardizem  gtt. Continue Cardizem  and metoprolol . Continue telemetry. Continue to monitor heart rate. Cardiology is consulted.   Acute metabolic encephalopathy likely due to poorly pharmacy. Patient has sudden change in the mental status. CT head rule out acute CVA. Continue to monitor clinical status.  Do not know if this may be her baseline.   CKD stage IIIb: Serum creatinine is at baseline.  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   Continue seizure precautions, fall precautions, aspiration precautions, no AED on medication list.   Essential hypertension: Continue Cardizem  60 mg q 12hr Continue metoprolol  25 mg  q12hr Continue Aldactone  25 mg daily.   DVT prophylaxis: SCDs Code  Status:full code Family Communication: No family at bed side Disposition Plan:   Status is: Inpatient Remains inpatient appropriate because: Severity of illness.    Consultants:  Cardiology Orthopedics  Procedures: Antimicrobials:  Anti-infectives (From admission, onward)    None       Subjective: Patient is seen and examined at bedside.  Overnight events noted. Patient was slightly confused but following commands. CT head was done to rule out CVA or intracranial bleed.  Objective: Vitals:   10/13/23 0915 10/13/23 1000 10/13/23 1211 10/13/23 1358  BP: (!) 154/88 (!) 160/102 (!) 176/116 (!) 151/100  Pulse: (!) 126 (!) 126 96 (!) 139  Resp: 13 17 16  (!) 25  Temp:   99.8 F (37.7 C) 99.6 F (37.6 C)  TempSrc:   Oral Oral  SpO2: 100% 100% 95%   Weight:    54.2 kg  Height:    4' 11 (1.499 m)   No intake or output data in the 24 hours ending 10/13/23 1520 Filed Weights   10/12/23 1247 10/13/23 1358  Weight: 59 kg 54.2 kg    Examination:  General exam: Appears calm and comfortable, deconditioned, not in any acute distress. Respiratory system: Clear to auscultation. Respiratory effort normal.  RR 16 Cardiovascular system: S1 & S2 heard, RRR. No JVD, murmurs, rubs, gallops or clicks.  Gastrointestinal system: Abdomen is non distended, soft and non tender.  Normal bowel sounds heard. Central nervous system: Alert and oriented x 2. No focal neurological deficits. Extremities: Left Hip tenderness + No edema, No cyanosis. Skin: No rashes, lesions or ulcers Psychiatry:  Mood & affect appropriate.     Data Reviewed: I have personally reviewed following labs and imaging  studies  CBC: Recent Labs  Lab 10/12/23 1314 10/13/23 0439  WBC 9.1 9.9  NEUTROABS 7.3  --   HGB 9.1* 8.9*  HCT 29.1* 28.0*  MCV 100.7* 97.6  PLT 211 196   Basic Metabolic Panel: Recent Labs  Lab 10/12/23 1314 10/13/23 0439  NA 133* 134*  K 4.4 4.5  CL 101 101  CO2 22 23  GLUCOSE 120*  128*  BUN 33* 29*  CREATININE 1.76* 1.59*  CALCIUM  8.9 9.0  MG 1.8  --    GFR: Estimated Creatinine Clearance: 20.1 mL/min (A) (by C-G formula based on SCr of 1.59 mg/dL (H)). Liver Function Tests: Recent Labs  Lab 10/12/23 1314 10/13/23 0439  AST 21 19  ALT 13 13  ALKPHOS 72 77  BILITOT 0.6 0.9  PROT 5.9* 6.2*  ALBUMIN 3.4* 3.3*   No results for input(s): LIPASE, AMYLASE in the last 168 hours. No results for input(s): AMMONIA in the last 168 hours. Coagulation Profile: No results for input(s): INR, PROTIME in the last 168 hours. Cardiac Enzymes: No results for input(s): CKTOTAL, CKMB, CKMBINDEX, TROPONINI in the last 168 hours. BNP (last 3 results) No results for input(s): PROBNP in the last 8760 hours. HbA1C: No results for input(s): HGBA1C in the last 72 hours. CBG: No results for input(s): GLUCAP in the last 168 hours. Lipid Profile: No results for input(s): CHOL, HDL, LDLCALC, TRIG, CHOLHDL, LDLDIRECT in the last 72 hours. Thyroid  Function Tests: No results for input(s): TSH, T4TOTAL, FREET4, T3FREE, THYROIDAB in the last 72 hours. Anemia Panel: Recent Labs    10/12/23 1314  FOLATE >20.0   Sepsis Labs: No results for input(s): PROCALCITON, LATICACIDVEN in the last 168 hours.  No results found for this or any previous visit (from the past 240 hours).  Radiology Studies: CT HEAD WO CONTRAST ( ) Result Date: 10/13/2023 EXAM: CT HEAD WITHOUT CONTRAST 10/13/2023 10:09:44 AM TECHNIQUE: CT of the head was performed without the administration of intravenous contrast. Automated exposure control, iterative reconstruction, and/or weight based adjustment of the mA/kV was utilized to reduce the radiation dose to as low as reasonably achievable. COMPARISON: CT head without contrast dated 05/07/2023. CLINICAL HISTORY: Mental status change, unknown cause. ams. FINDINGS: BRAIN AND VENTRICLES: No acute hemorrhage. No evidence of  acute infarct. No hydrocephalus. No extra-axial collection. No mass effect or midline shift. Moderate atrophy and white matter changes are stable. 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 Atherosclerotic calcifications are present in the cavernous carotid arteries bilaterally and at the dural margin of both vertebral arteries. No hyperdense vessel is present. ORBITS: Bilateral lens replacements are noted. The globes and orbits are otherwise within normal limits. SINUSES: Chronic opacification of the right maxillary sinus is again noted. Anterior right ethmoid air cells are chronically opacified. Calcifications are present within nasopacified sinuses. SOFT TISSUES AND SKULL: No acute soft tissue abnormality. No skull fracture. IMPRESSION: 1. No acute intracranial abnormality. 2. Stable moderate atrophy and white matter changes. 3. 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 4. Chronic opacification of the right maxillary sinus and anterior right ethmoid air cells with calcifications. Electronically signed by: Lonni Necessary MD 10/13/2023 10:16 AM EDT RP Workstation: [id number]   DG Hip Unilat W or Wo Pelvis 2-3 Views Left Result Date: 10/12/2023 CLINICAL DATA:  Status post fall. EXAM: DG HIP (WITH OR WITHOUT PELVIS) 2-3V LEFT COMPARISON:  November 11, 2022 FINDINGS: There is no evidence of an  acute hip fracture or dislocation. A radiopaque intramedullary rod and compression screw device are seen within the proximal left femur. A chronic proximal left femoral fracture deformity is noted, with additional chronic fractures of the right superior and right inferior pubic rami. Stable degenerative changes are seen in the form of joint space narrowing and acetabular sclerosis. IMPRESSION: 1. No acute fracture or dislocation. 2. Chronic fractures of the proximal left femur and right superior and right inferior pubic rami.  Electronically Signed   By: Suzen Dials M.D.   On: 10/12/2023 14:42   DG Knee Complete 4 Views Left Result Date: 10/12/2023 CLINICAL DATA:  Status post fall. 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 An acute, mildly displaced fracture is seen extending through the supracondylar regions of the distal left femur. This extends to the level of the femoral prosthesis. A radiopaque intramedullary rod and distal fixation screw are seen within the visualized left femoral shaft. There is no evidence of dislocation. A large joint effusion is noted. IMPRESSION: 1. Acute fracture of the distal left femur. 2. Left knee replacement. 3. Large joint effusion. Electronically Signed   By: Suzen Dials M.D.   On: 10/12/2023 14:38   DG Ankle Complete Left Result Date: 10/12/2023 CLINICAL DATA:  Left ankle pain after falling EXAM: LEFT ANKLE COMPLETE - 3 VIEW COMPARISON:  None Available. FINDINGS: Surgical changes of prior ORIF of a distal tibia fracture with 3 orthopedic pins in place. No evidence of acute fracture. Residual posttraumatic deformity present of the distal tibia and fibula. Osseous fusion of the syndesmotic ligament. 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 Are mild atherosclerotic calcifications in the posterior tibial artery. IMPRESSION: 1. No acute fracture or malalignment. 2. Evidence of remote prior distal tibia and fibular fracture status post ORIF of the distal tibial with ankylosis of the syndesmosis. 3. 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 Electronically Signed   By: Wilkie Lent M.D.   On: 10/12/2023 14:36   Scheduled Meds:  anastrozole   1 mg Oral Daily   chlorhexidine   60 mL Topical Once   docusate sodium   100 mg Oral BID   loratadine   10 mg Oral Daily   metoprolol  succinate  25 mg Oral BID   pantoprazole   40 mg Oral Daily   rosuvastatin   10 mg Oral Once per  day on Sunday Wednesday   sertraline   75 mg Oral QHS   Continuous Infusions:  diltiazem  (CARDIZEM ) infusion 10 mg/hr (10/13/23 1500)     LOS: 1 day    Time spent: 50 mins    Darcel Dawley, MD [facility]   If 7PM-7AM, please contact night-coverage

## 2023-10-13 NOTE — ED Notes (Signed)
 Rolling call to floor.

## 2023-10-13 NOTE — Progress Notes (Signed)
 Provider Leotis MD aware via secure chat  to change in mentation status from 4x to 0x and hr in 120s afib.  Report given to short stay Adriana RN. Short stay says per pts current status to hold sending them upstairs at this time.

## 2023-10-13 NOTE — TOC Initial Note (Signed)
 Transition of Care [facility]) - Initial/Assessment Note    Patient Details  Name: Brandi [name] MRN: [id number] Date of Birth: 08-27-1940  Transition of Care [facility]) CM/SW Contact:    Lauraine FORBES Saa, LCSWA Phone Number: 10/13/2023, 2:51 PM  Clinical Narrative:                  2:51 PM CSW spoke with patient's husband, [name] (per chart review, patient is not fully oriented). [name] confirmed patient resides with him and their adult who. [name] said him and their adult son provide transportation for patient. [name] confirmed patient has SNF history with [facility] and [facility]. [name] confirmed patient has HH history with [facility] and [facility], who she is currently active with.[name] said [facility] comes to patients house for an hour on Wednesdays for INR. [name] confirmed patient has DME (wheelchair, oxygen, RW, rollator) history but that she currently does not use oxygen. Per chart review, patient has a PCP and insurance. Patient's preferred pharmacy's are [name], [facility] [address], and [facility]. TOC will continue to follow and be available to assist.  Expected Discharge Plan: Home w Home Health Services Barriers to Discharge: Continued Medical Work up   Patient Goals and CMS Choice            Expected Discharge Plan and Services   Discharge Planning Services: CM Consult   Living arrangements for the past 2 months: Single Family Home                                      Prior Living Arrangements/Services Living arrangements for the past 2 months: Single Family Home Lives with:: Spouse, Adult Children Patient language and need for interpreter reviewed:: Yes        Need for Family Participation in Patient Care: Yes (Comment) Care giver support system in place?: Yes (comment) Current home services: DME, Home RN Criminal Activity/Legal Involvement Pertinent to Current  Situation/Hospitalization: No - Comment as needed  Activities of Daily Living   ADL Screening (condition at time of admission) Independently performs ADLs?: No Does the patient have a NEW difficulty with bathing/dressing/toileting/self-feeding that is expected to last >3 days?: No Does the patient have a NEW difficulty with getting in/out of bed, walking, or climbing stairs that is expected to last >3 days?: No Does the patient have a NEW difficulty with communication that is expected to last >3 days?: No Is the patient deaf or have difficulty hearing?: No Does the patient have difficulty seeing, even when wearing glasses/contacts?: No Does the patient have difficulty concentrating, remembering, or making decisions?: No  Permission Sought/Granted Permission sought to share information with : Family Supports, Oceanographer granted to share information with : No (Contact information on chart)  Share Information with NAME: Khalil Belote  Permission granted to share info w AGENCY: [facility]  Permission granted to share info w Relationship: Spouse  Permission granted to share info w Contact Information: [phone]  Emotional Assessment   Attitude/Demeanor/Rapport: Unable to Assess Affect (typically observed): Unable to Assess Orientation: : Oriented to Self Alcohol  / Substance Use: Not Applicable Psych Involvement: No (comment)  Admission diagnosis:  Fall at home, initial encounter [W19.XXXA, Y92.009] Closed fracture of distal end of left femur, unspecified fracture morphology, initial encounter [facility]) [S72.402A] Patient Active Problem List   Diagnosis Date Noted   Fall at home,  initial encounter 10/12/2023   Hyponatremia 07/29/2023   Subdural hematoma (HCC) 07/28/2023   Head injury 07/28/2023   Seizure (HCC) 07/28/2023   Fall 07/28/2023   Malignant neoplasm of overlapping sites of right breast in female, estrogen receptor positive (HCC)  05/10/2023   Falls frequently 11/11/2022   Asymptomatic bacteriuria 11/11/2022   Closed intertrochanteric fracture of left femur, initial encounter (HCC) 11/10/2022   Closed traumatic fractures of multiple bones of right hip and pelvis, initial encounter (HCC) 08/25/2022   Chronic kidney disease, stage 3b (HCC) 08/25/2022   Compression fracture of T10 vertebra (HCC) 08/25/2022   Essential hypertension 08/25/2022   Acute renal failure superimposed on stage 3b chronic kidney disease (HCC) 08/14/2021   Hypokalemia 08/14/2021   Generalized weakness 08/14/2021   Acute metabolic encephalopathy 08/14/2021   Paroxysmal atrial fibrillation (HCC) 08/14/2021   Normocytic anemia 08/14/2021   Long term (current) use of anticoagulants 08/13/2021   Secondary hypercoagulable state 07/17/2021   Atrial flutter (HCC) 07/11/2021   CAP (community acquired pneumonia) 07/07/2021   Acute respiratory failure with hypoxia (HCC) 07/07/2021   AKI (acute kidney injury) 07/07/2021   Troponin level elevated 07/07/2021   Vasovagal episode 06/08/2017   Mixed hyperlipidemia 06/08/2017   Chest pain with moderate risk for cardiac etiology 06/07/2017   Abnormal EKG 06/07/2017   Depression 03/18/2012   Mild obesity 03/18/2012   PCP:  Addie Camellia CROME, MD Pharmacy:   [facility] [address], [place] - [address] [place] [address] Phone: [phone] Fax: [phone]  [facility] - [place], [place] - [facility] AT Portal to Registered [address] [place] [address] Phone: [phone] Fax: [phone]  [place] [facility] [address] [place] [address] Phone: [phone] Fax: [phone]     Social Drivers of Health (SDOH) Social History: SDOH Screenings   Food Insecurity: Patient Unable To Answer (10/13/2023)  Housing: Patient Unable To Answer (10/13/2023)   Transportation Needs: Unknown (10/13/2023)  Utilities: Patient Unable To Answer (10/13/2023)  Depression (PHQ2-9): Low Risk  (09/14/2023)  Social Connections: Patient Unable To Answer (10/13/2023)  Tobacco Use: Low Risk  (10/13/2023)   SDOH Interventions: Housing Interventions: Patient Unable to Answer Transportation Interventions: Patient Unable to Answer Utilities Interventions: Patient Unable to Answer Social Connections Interventions: Patient Unable to Answer   Readmission Risk Interventions    07/30/2023   11:14 AM 11/12/2022    3:06 PM  Readmission Risk Prevention Plan  Transportation Screening Complete Complete  PCP or Specialist Appt within 3-5 Days  Complete  HRI or Home Care Consult Complete Complete  Social Work Consult for Recovery Care Planning/Counseling Complete   Palliative Care Screening Not Applicable Complete  Medication Review Oceanographer) Complete Complete

## 2023-10-13 NOTE — Progress Notes (Signed)
 Upon receiving report from [place], RN [id number], he informed that the pt is confused and unable to answer any questions. He also observed that her HR is between 110-140's, cardiac monitor reading Atrial Fibrillation. Pt does have a history of this, but has not been given any medications to treat because he is not able to get in touch with the admitting doctor. Dr. Maryclare with anesthesia made aware. Awaiting further orders.

## 2023-10-13 NOTE — Consult Note (Signed)
 Cardiology Consultation   Patient ID: Brandi [name] MRN: [id number]; DOB: [birth year]  Admit date: 10/12/2023 Date of Consult: 10/13/2023  PCP:  Addie Camellia CROME, MD   [facility] Providers Cardiologist:  Evalene Lunger, MD    Patient Profile: Brandi [name] is a 83 y.o. female with a hx of hypertension, hyperlipidemia, paroxysmal atrial fibrillation, breast CA dx 04/2023, history of subdural hematoma who is being seen 10/13/2023 for the evaluation preop at the request of Dr. Leotis.  History of Present Illness: Brandi [name] is an 83 year old female with past medical history noted above.  She has been followed by Dr. Gollan as an outpatient.  Initially admitted 06/2021 in the setting of acute hypoxic respiratory failure secondary to pneumonia.  Developed an AKI that admission and troponins were mildly elevated at 66 and 45 consistent with demand ischemia.  Echocardiogram showed LVEF of 60 to 65%, no regional wall motion normality, grade 1 diastolic dysfunction, normal RV, moderate biatrial enlargement with moderately elevated PASP.  During that admission she developed atrial flutter with variable AV block.  Her carvedilol  was changed to metoprolol  for better rate control, and started on diltiazem  as well as Eliquis .  Admitted 10/2022 after sustaining a fall and found to have a left intertrochanteric fracture and underwent ORIF.  Postoperative course was complicated by urinary retention and required a Foley.  She was seen last in the clinic 04/2023 with Joen Goodell, NP for follow-up and reported she had been doing well since her last hospitalization in October 2024.  She was continued on diltiazem  60 mg twice daily as well as Toprol  XL 25 mg daily.  Was being through the Coumadin  clinic as Eliquis  was too expensive.  Admitted 07/2023 in the setting of a fall with subdural hematoma in the setting of Coumadin .  Her INR was reversed in the ER.  Neurosurgery was consulted and repeat head  showed no change.  It was recommended that she hold warfarin for 2 weeks and then resume if cleared by neurosurgery. Echocardiogram 07/2023 with LVEF of 60 to 65%, no regional wall motion abnormality, mildly enlarged RV, moderate biatrial enlargement, moderate mitral valve regurgitation.  Seen by oncology 09/14/2023 was continued on anastrozole , and was referred to Dr. Aron potential surgery.  Presented to the ED on 9/23 after a fall where she reportedly tripped over her dog.  In the ED her labs showed sodium 133, potassium 4.4, creatinine 1.76, magnesium  1.8, WBC 9.1, hemoglobin 9.1.  EKG showed rate controlled atrial fibrillation.  Imaging revealed a left distal femur fracture.  Admitted to internal medicine for further evaluation.  Orthopedics consulted and plan for left ORIF tomorrow.  Notes indicate that early morning she became more tachycardic with A-fib RVR and also became disoriented.  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  Cardiology asked to weigh in regards to preop.   Past Medical History:  Diagnosis Date   Allergy    Atrial fibrillation (HCC)    Dysrhythmia    Hyperlipidemia    Hypertension    Neuromuscular disorder (HCC)    Venous insufficiency     Past Surgical History:  Procedure Laterality Date   ANKLE SURGERY Left    INTRAMEDULLARY (IM) NAIL INTERTROCHANTERIC Left 11/11/2022   Procedure: INTRAMEDULLARY (IM) NAIL INTERTROCHANTERIC;  Surgeon: Edna Toribio LABOR, MD;  Location: [facility];  Service: Orthopedics;  Laterality: Left;   KNEE SURGERY Left    VARICOSE VEIN  SURGERY       Scheduled Meds:  anastrozole   1 mg Oral Daily   chlorhexidine   60 mL Topical Once   diltiazem   10 mg Intravenous Once   docusate sodium   100 mg Oral BID   loratadine   10 mg Oral Daily   metoprolol  succinate  25 mg Oral BID   pantoprazole   40 mg Oral Daily   rosuvastatin   10 mg Oral Once per day  on Sunday Wednesday   sertraline   75 mg Oral QHS   Continuous Infusions:  diltiazem  (CARDIZEM ) infusion     PRN Meds: bisacodyl , HYDROcodone -acetaminophen , methocarbamol  **OR** methocarbamol  (ROBAXIN ) injection, metoprolol  tartrate, morphine  injection, polyethylene glycol  Allergies:    Allergies  Allergen Reactions   Amoxil [Amoxicillin] Diarrhea   Celebrex [Celecoxib] Other (See Comments)    Bruising and stomach pain   Keppra  [Levetiracetam ] Other (See Comments)    Somnolence    Social History:   Social History   Socioeconomic History   Marital status: Married    Spouse name: Not on file   Number of children: 3   Years of education: Not on file   Highest education level: Not on file  Occupational History   Not on file  Tobacco Use   Smoking status: Never    Passive exposure: Never   Smokeless tobacco: Never   Tobacco comments:    Never smoke 07/17/21  Vaping Use   Vaping status: Never Used  Substance and Sexual Activity   Alcohol  use: Never   Drug use: Never   Sexual activity: Not Currently  Other Topics Concern   Not on file  Social History Narrative   Not on file   Social Drivers of Health   Financial Resource Strain: Not on file  Food Insecurity: No Food Insecurity (07/29/2023)   Hunger Vital Sign    Worried About Running Out of Food in the Last Year: Never true    Ran Out of Food in the Last Year: Never true  Transportation Needs: No Transportation Needs (07/29/2023)   PRAPARE - Administrator, Civil Service (Medical): No    Lack of Transportation (Non-Medical): No  Physical Activity: Not on file  Stress: Not on file  Social Connections: Socially Integrated (07/29/2023)   Social Connection and Isolation Panel    Frequency of Communication with Friends and Family: More than three times a week    Frequency of Social Gatherings with Friends and Family: Patient unable to answer    Attends Religious Services: 1 to 4 times per year    Active  Member of [employer] or Organizations: Yes    Attends Banker Meetings: Never    Marital Status: Married  Catering manager Violence: Unknown (07/29/2023)   Humiliation, Afraid, Rape, and Kick questionnaire    Fear of Current or Ex-Partner: No    Emotionally Abused: No    Physically Abused: No    Sexually Abused: Not on file    Family History:    Family History  Problem Relation Age of Onset   Heart attack Father      ROS:  Please see the history of present illness.   All other ROS reviewed and negative.     Physical Exam/Data: Vitals:   10/13/23 0811 10/13/23 0915 10/13/23 1000 10/13/23 1211  BP:  (!) 154/88 (!) 160/102 (!) 176/116  Pulse:  (!) 126 (!) 126 96  Resp:  13 17 16   Temp: 99.7 F (37.6 C)   99.8 F (37.7 C)  TempSrc: Axillary   Oral  SpO2:  100% 100% 95%  Weight:      Height:       No intake or output data in the 24 hours ending 10/13/23 1221    10/12/2023   12:47 PM 09/14/2023    1:12 PM 07/28/2023    3:00 PM  Last 3 Weights  Weight (lbs) 130 lb 120 lb 11.2 oz 118 lb  Weight (kg) 58.968 kg 54.749 kg 53.524 kg     Body mass index is 26.26 kg/m.  General: Frail older female, alert to self HEENT: normal Neck: no JVD Vascular: Distal pulses 2+ bilaterally Cardiac:  normal S1, S2; irregularly irregular; soft systolic murmur  Lungs:  clear to auscultation bilaterally, no wheezing, rhonchi or rales  Abd: soft, non-tender Ext: no edema Musculoskeletal: generalized weakness  Skin: warm and dry  Neuro: no focal abnormalities noted Psych:  Normal affect   EKG:  The EKG was personally reviewed and demonstrates: Atrial fibrillation, 83 bpm Telemetry:  Telemetry was personally reviewed and demonstrates:  n/a  Relevant CV Studies:  Echo: 07/2023  IMPRESSIONS     1. Left ventricular ejection fraction, by estimation, is 60 to 65%. The  left ventricle has normal function. The left ventricle has no regional  wall motion abnormalities. Left  ventricular diastolic parameters are  indeterminate.   2. Right ventricular systolic function is normal. The right ventricular  size is mildly enlarged. There is moderately elevated pulmonary artery  systolic pressure. The estimated right ventricular systolic pressure is  52.5 mmHg.   3. Left atrial size was moderately dilated.   4. Right atrial size was moderately dilated.   5. The mitral valve is degenerative. Moderate mitral valve regurgitation.  No evidence of mitral stenosis.   6. Tricuspid valve regurgitation is moderate.   7. The aortic valve is tricuspid. Aortic valve regurgitation is trivial.  No aortic stenosis is present.   8. The inferior vena cava is dilated in size with <50% respiratory  variability, suggesting right atrial pressure of 15 mmHg.   FINDINGS   Left Ventricle: Left ventricular ejection fraction, by estimation, is 60  to 65%. The left ventricle has normal function. The left ventricle has no  regional wall motion abnormalities. The left ventricular internal cavity  size was normal in size. There is   borderline left ventricular hypertrophy. Left ventricular diastolic  function could not be evaluated due to atrial fibrillation. Left  ventricular diastolic parameters are indeterminate.   Right Ventricle: The right ventricular size is mildly enlarged. No  increase in right ventricular wall thickness. Right ventricular systolic  function is normal. There is moderately elevated pulmonary artery systolic  pressure. The tricuspid regurgitant  velocity is 3.06 m/s, and with an assumed right atrial pressure of 15  mmHg, the estimated right ventricular systolic pressure is 52.5 mmHg.   Left Atrium: Left atrial size was moderately dilated.   Right Atrium: Right atrial size was moderately dilated.   Pericardium: There is no evidence of pericardial effusion.   Mitral Valve: The mitral valve is degenerative in appearance. Mild mitral  annular calcification. Moderate  mitral valve regurgitation. No evidence of  mitral valve stenosis. MV peak gradient, 7.1 mmHg. The mean mitral valve  gradient is 2.0 mmHg.   Tricuspid Valve: The tricuspid valve is normal in structure. Tricuspid  valve regurgitation is moderate . No evidence of tricuspid stenosis.   Aortic Valve: The aortic valve is tricuspid. Aortic valve regurgitation is  trivial. No aortic  stenosis is present.   Pulmonic Valve: The pulmonic valve was normal in structure. Pulmonic valve  regurgitation is not visualized. No evidence of pulmonic stenosis.   Aorta: The aortic root and ascending aorta are structurally normal, with  no evidence of dilitation.   Venous: The inferior vena cava is dilated in size with less than 50%  respiratory variability, suggesting right atrial pressure of 15 mmHg.   IAS/Shunts: No atrial level shunt detected by color flow Doppler.    Laboratory Data: High Sensitivity Troponin:  No results for input(s): TROPONINIHS in the last 720 hours.   Chemistry Recent Labs  Lab 10/12/23 1314 10/13/23 0439  NA 133* 134*  K 4.4 4.5  CL 101 101  CO2 22 23  GLUCOSE 120* 128*  BUN 33* 29*  CREATININE 1.76* 1.59*  CALCIUM  8.9 9.0  MG 1.8  --   GFRNONAA 28* 32*  ANIONGAP 10 10    Recent Labs  Lab 10/12/23 1314 10/13/23 0439  PROT 5.9* 6.2*  ALBUMIN 3.4* 3.3*  AST 21 19  ALT 13 13  ALKPHOS 72 77  BILITOT 0.6 0.9   Lipids No results for input(s): CHOL, TRIG, HDL, LABVLDL, LDLCALC, CHOLHDL in the last 168 hours.  Hematology Recent Labs  Lab 10/12/23 1314 10/13/23 0439  WBC 9.1 9.9  RBC 2.89* 2.87*  HGB 9.1* 8.9*  HCT 29.1* 28.0*  MCV 100.7* 97.6  MCH 31.5 31.0  MCHC 31.3 31.8  RDW 12.9 13.0  PLT 211 196   Thyroid  No results for input(s): TSH, FREET4 in the last 168 hours.  BNPNo results for input(s): BNP, PROBNP in the last 168 hours.  DDimer No results for input(s): DDIMER in the last 168 hours.  Radiology/Studies:  CT HEAD WO  CONTRAST ( ) Result Date: 10/13/2023 EXAM: CT HEAD WITHOUT CONTRAST 10/13/2023 10:09:44 AM TECHNIQUE: CT of the head was performed without the administration of intravenous contrast. Automated exposure control, iterative reconstruction, and/or weight based adjustment of the mA/kV was utilized to reduce the radiation dose to as low as reasonably achievable. COMPARISON: CT head without contrast dated 05/07/2023. CLINICAL HISTORY: Mental status change, unknown cause. ams. FINDINGS: BRAIN AND VENTRICLES: No acute hemorrhage. No evidence of acute infarct. No hydrocephalus. No extra-axial collection. No mass effect or midline shift. Moderate atrophy and white matter changes are stable. 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 Atherosclerotic calcifications are present in the cavernous carotid arteries bilaterally and at the dural margin of both vertebral arteries. No hyperdense vessel is present. ORBITS: Bilateral lens replacements are noted. The globes and orbits are otherwise within normal limits. SINUSES: Chronic opacification of the right maxillary sinus is again noted. Anterior right ethmoid air cells are chronically opacified. Calcifications are present within nasopacified sinuses. SOFT TISSUES AND SKULL: No acute soft tissue abnormality. No skull fracture. IMPRESSION: 1. No acute intracranial abnormality. 2. Stable moderate atrophy and white matter changes. 3. 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 4. Chronic opacification of the right maxillary sinus and anterior right ethmoid air cells with calcifications. Electronically signed by: Lonni Necessary MD 10/13/2023 10:16 AM EDT RP Workstation: [id number]   DG Hip Unilat W or Wo Pelvis 2-3 Views Left Result Date: 10/12/2023 CLINICAL DATA:  Status post fall. EXAM: DG HIP (WITH OR WITHOUT PELVIS) 2-3V LEFT COMPARISON:  November 11, 2022 FINDINGS: There is no evidence of an  acute hip fracture or dislocation. A radiopaque intramedullary rod and compression  screw device are seen within the proximal left femur. A chronic proximal left femoral fracture deformity is noted, with additional chronic fractures of the right superior and right inferior pubic rami. Stable degenerative changes are seen in the form of joint space narrowing and acetabular sclerosis. IMPRESSION: 1. No acute fracture or dislocation. 2. Chronic fractures of the proximal left femur and right superior and right inferior pubic rami. Electronically Signed   By: Suzen Dials M.D.   On: 10/12/2023 14:42   DG Knee Complete 4 Views Left Result Date: 10/12/2023 CLINICAL DATA:  Status post fall. 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 An acute, mildly displaced fracture is seen extending through the supracondylar regions of the distal left femur. This extends to the level of the femoral prosthesis. A radiopaque intramedullary rod and distal fixation screw are seen within the visualized left femoral shaft. There is no evidence of dislocation. A large joint effusion is noted. IMPRESSION: 1. Acute fracture of the distal left femur. 2. Left knee replacement. 3. Large joint effusion. Electronically Signed   By: Suzen Dials M.D.   On: 10/12/2023 14:38   DG Ankle Complete Left Result Date: 10/12/2023 CLINICAL DATA:  Left ankle pain after falling EXAM: LEFT ANKLE COMPLETE - 3 VIEW COMPARISON:  None Available. FINDINGS: Surgical changes of prior ORIF of a distal tibia fracture with 3 orthopedic pins in place. No evidence of acute fracture. Residual posttraumatic deformity present of the distal tibia and fibula. Osseous fusion of the syndesmotic ligament. 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 Are mild atherosclerotic calcifications in the posterior tibial artery. IMPRESSION: 1. No acute fracture or malalignment. 2. Evidence of  remote prior distal tibia and fibular fracture status post ORIF of the distal tibial with ankylosis of the syndesmosis. 3. 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 Electronically Signed   By: Wilkie Lent M.D.   On: 10/12/2023 14:36     Assessment and Plan:  Brandi [name] is a 83 y.o. female with a hx of hypertension, hyperlipidemia, paroxysmal atrial fibrillation, breast CA dx 04/2023, history of subdural hematoma who is being seen 10/13/2023 for the evaluation preop at the request of Dr. Leotis.  Preop evaluation w/ left femur fracture Atrial fibrillation with RVR -- Presented after a fall yesterday after she tripped over her dog.  Heart rates were initially relatively controlled in the ER but became tachycardic overnight into this morning. -- Notes indicate she was initially alert and oriented x 4, now alert to self and knows that she tripped over her dog.  No signs of volume overload. -- Last echocardiogram 07/2023 with normal LVEF, moderate biatrial enlarged, moderate MR -- Will stop p.o. diltiazem  and transition over to IV diltiazem  with cardiac monitoring in order to maintain better heart rate control with plans for surgery -- Continue metoprolol  XL 25 mg daily -- Coumadin  held on admission, INR pending -- At this time would not anticipate further cardiac workup prior to ORIF  Per primary History of subdural hematoma Breast cancer Seizure CKD 3B   Risk Assessment/Risk Scores:  CHA2DS2-VASc Score = 4   This indicates a 4.8% annual risk of stroke. The patient's score is based upon: CHF History: 0 HTN History: 1 Diabetes History: 0 Stroke History: 0 Vascular Disease History: 0 Age Score: 2 Gender Score: 1   For questions or updates, please contact [facility] Please consult [email or web address] for contact info under  Signed, Manuelita Rummer, NP  10/13/2023 12:21 PM

## 2023-10-13 NOTE — Progress Notes (Signed)
 Orthopedic Tech Progress Note Patient Details:  Brandi [name] Sep 03, 1940 [id number]  Ortho Devices Type of Ortho Device: Knee Immobilizer Ortho Device/Splint Location: LLE Ortho Device/Splint Interventions: Ordered, Adjustment, Applicationapplied with daughter at bedside    Post Interventions Patient Tolerated: Well Instructions Provided: Care of device  [name] Pac 10/13/2023, 4:21 PM

## 2023-10-13 NOTE — ED Notes (Signed)
 PT OTF with RN transport.

## 2023-10-13 NOTE — ED Notes (Signed)
Pt otf to ct

## 2023-10-14 ENCOUNTER — Inpatient Hospital Stay (HOSPITAL_COMMUNITY)

## 2023-10-14 ENCOUNTER — Encounter (HOSPITAL_COMMUNITY)
Admission: EM | Disposition: A | Discharge status: Skilled Nursing Facility | Payer: Self-pay | Source: Home / Self Care | Attending: Family Medicine

## 2023-10-14 ENCOUNTER — Encounter (HOSPITAL_COMMUNITY): Payer: Self-pay | Admitting: Anesthesiology

## 2023-10-14 DIAGNOSIS — W19XXXA Unspecified fall, initial encounter: Secondary | ICD-10-CM | POA: Diagnosis not present

## 2023-10-14 DIAGNOSIS — Y92009 Unspecified place in unspecified non-institutional (private) residence as the place of occurrence of the external cause: Secondary | ICD-10-CM | POA: Diagnosis not present

## 2023-10-14 DIAGNOSIS — E785 Hyperlipidemia, unspecified: Secondary | ICD-10-CM | POA: Diagnosis not present

## 2023-10-14 LAB — TYPE AND SCREEN
ABO/RH(D): A POS
Antibody Screen: NEGATIVE

## 2023-10-14 LAB — PROTIME-INR
INR: 2.1 — ABNORMAL HIGH (ref 0.8–1.2)
Prothrombin Time: 25 s — ABNORMAL HIGH (ref 11.4–15.2)

## 2023-10-14 SURGERY — OPEN REDUCTION INTERNAL FIXATION (ORIF) DISTAL FEMUR FRACTURE
Anesthesia: General | Laterality: Left

## 2023-10-14 MED ORDER — SODIUM CHLORIDE 0.9% IV SOLUTION: Freq: Once | INTRAVENOUS | Status: AC

## 2023-10-14 MED ORDER — ENSURE PLUS HIGH PROTEIN PO LIQD
237.0000 mL | Freq: Two times a day (BID) | ORAL | Status: DC
  Administered 2023-10-17 – 2023-10-20 (×5): 237 mL via ORAL
  Filled 2023-10-14: qty 237

## 2023-10-14 MED ORDER — ACETAMINOPHEN 500 MG PO TABS
1000.0000 mg | ORAL_TABLET | Freq: Once | ORAL | Status: AC
  Administered 2023-10-14: 1000 mg via ORAL
  Filled 2023-10-14: qty 2

## 2023-10-14 MED ORDER — ADULT MULTIVITAMIN W/MINERALS CH
1.0000 | ORAL_TABLET | Freq: Every day | ORAL | Status: DC
  Administered 2023-10-16 – 2023-10-20 (×5): 1 via ORAL
  Filled 2023-10-14 (×5): qty 1

## 2023-10-14 MED ORDER — ORAL CARE MOUTH RINSE: 15.0000 mL | Freq: Once | OROMUCOSAL | Status: AC

## 2023-10-14 MED ORDER — TRANEXAMIC ACID-NACL 1000-0.7 MG/100ML-% IV SOLN
1000.0000 mg | INTRAVENOUS | Status: DC
  Filled 2023-10-14: qty 100

## 2023-10-14 MED ORDER — LACTATED RINGERS IV SOLN: INTRAVENOUS | Status: DC

## 2023-10-14 MED ORDER — CEFAZOLIN SODIUM-DEXTROSE 2-4 GM/100ML-% IV SOLN
2.0000 g | INTRAVENOUS | Status: DC
Start: 2023-10-14 — End: 2023-10-14
  Filled 2023-10-14: qty 100

## 2023-10-14 MED ORDER — CHLORHEXIDINE GLUCONATE 0.12 % MT SOLN: 15.0000 mL | Freq: Once | OROMUCOSAL | Status: AC

## 2023-10-14 MED ORDER — CHLORHEXIDINE GLUCONATE 0.12 % MT SOLN
OROMUCOSAL | Status: AC
  Administered 2023-10-14: 15 mL via OROMUCOSAL
  Filled 2023-10-14: qty 15

## 2023-10-14 MED ORDER — POVIDONE-IODINE 10 % EX SWAB
2.0000 | Freq: Once | CUTANEOUS | Status: AC
  Administered 2023-10-14: 2 via TOPICAL

## 2023-10-14 NOTE — Progress Notes (Signed)
 [facility]    Case postponed today due to persistently elevated INR Discussed with primary team and they are okay with giving a unit of FFP for Coumadin  reversal  Patient has been placed on surgery schedule for Dr. Kendal for tomorrow afternoon  Cbc and INR tomorrow morning Npo after midnight  Brandi MICAEL Mt, PA-C [phone] (C) 10/14/2023, 1:17 PM  [facility] [address] [place] [address] [phone] Brandi [name] (F)     Patient ID: Brandi [name], female   DOB: Jan 26, 1940, 83 y.o.   MRN: [id number]

## 2023-10-14 NOTE — Plan of Care (Signed)

## 2023-10-14 NOTE — TOC CAGE-AID Note (Signed)
 Transition of Care [facility]) - CAGE-AID Screening   Patient Details  Name: Brandi [name] MRN: [id number] Date of Birth: October 05, 1940  Transition of Care [facility] CM/SW Contact:    Chrisopher Pustejovsky E Kimbra Marcelino, LCSW Phone Number: 10/14/2023, 10:27 AM   Clinical Narrative: Not fully oriented. No SA noted.   CAGE-AID Screening: Substance Abuse Screening unable to be completed due to: : Patient unable to participate

## 2023-10-14 NOTE — Progress Notes (Signed)
 Pt cancelled per Dr. Celena due to INR level.

## 2023-10-14 NOTE — Progress Notes (Signed)
 Initial Nutrition Assessment  DOCUMENTATION CODES:   Not applicable  INTERVENTION:  Add Ensure Plus High Protein po BID, each supplement provides 350 kcal and 20 grams of protein  Add Magic cup BID with meals, each supplement provides 290 kcal and 9 grams of protein  Add MVI w/ minerals Liberalized diet when re-initiated to maximize PO intake   NUTRITION DIAGNOSIS:  Increased nutrient needs related to post-op healing as evidenced by estimated needs.  GOAL:  Patient will meet greater than or equal to 90% of their needs  MONITOR:  Supplement acceptance, PO intake  REASON FOR ASSESSMENT:  Consult Assessment of nutrition requirement/status  ASSESSMENT:   Pt with PMH significant for: afib, HLD, HTN, obesity, CKD III, seizures and recurrent falls. Presented to ED with c/o hip pain and found to have acute fracture of left distal femur s/p fall.  Met with patient at bedside. She is drowsy at time of assessment and somewhat delayed in her responses. Scheduled for surgery today. She is not fully oriented.  Average Meal Intake No documentation available for review  Could not endorse usual day's intake often trailing off and not finishing her answers. States she usually consumes two meals her day. Lives at home with husband and adult son(s). Adequate dentition observed. Reports she does drink strawberry Ensure at home and is willing to receive it here post surgery.    Admit Weight: 59 kg - appears stated Current Weight: 54.2 kg  She states usually body weight as around 135lbs. Per chart review, she is 120lbs. Has shown 17% weight loss in last year. While this is concerning, it is not considered clinically significant for the time frame reviewed. No edema on exam. No BM this admission. Reports one BM every other day.   Meds: docusate sodium , pantoprazole , IV ABX   Labs from 9/24 reviewed:  Na+ 133>134 (L) K+ 4.5 (wdl) BUN 33>29 (H) Crt 1.76>1.59 (H) - baseline 1.3-1.6 CBGs  120-128 x24 hours    NUTRITION - FOCUSED PHYSICAL EXAM:  Based on NFPE and documented weight trends, she does not currently meet criteria for malnutrition, however suspect it may be present. Unable to collect usual intake, which could mean she meets criteria. She is at elevated risk for malnutrition r/t her chronic comorbidities and advanced age.   Flowsheet Row Most Recent Value  Orbital Region No depletion  Upper Arm Region No depletion  Thoracic and Lumbar Region Mild depletion  Buccal Region No depletion  Temple Region No depletion  Clavicle Bone Region Severe depletion  Clavicle and Acromion Bone Region Severe depletion  Scapular Bone Region Severe depletion  Dorsal Hand Mild depletion  Patellar Region No depletion  Anterior Thigh Region Mild depletion  Posterior Calf Region Mild depletion  Edema (RD Assessment) None  Hair Reviewed  Eyes Reviewed  Mouth Reviewed  Skin Reviewed  Nails Reviewed    Diet Order:   Diet Order             Diet NPO time specified  Diet effective midnight             EDUCATION NEEDS:   Not appropriate for education at this time  Skin:  Skin Assessment: Reviewed RN Assessment  Last BM:  PTA  Height:  Ht Readings from Last 1 Encounters:  10/13/23 4' 11 (1.499 m)   Weight:  Wt Readings from Last 1 Encounters:  10/13/23 54.2 kg   Ideal Body Weight:  43.2 kg  BMI:  Body mass index is 24.13 kg/m.  Estimated Nutritional Needs:   Kcal:  1500-1700 kcals  Protein:  70-85g  Fluid:  1.5-1.7L/day  Blair Deaner MS, RD, LDN Registered Dietitian Clinical Nutrition RD Inpatient Contact Info in Amion

## 2023-10-14 NOTE — Progress Notes (Signed)
 Telemetry reviewed. Afib rate better controlled on IV diltiazem . Continue the same perioperatively and transition to equivalent dose PO diltiazem  post operatively. Please call us  back post op if any questions regarding diltiazem  dosing.   Newman JINNY Lawrence, MD

## 2023-10-14 NOTE — Progress Notes (Signed)
 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  X-ray of the left hip negative for acute fracture or dislocation.  X-ray of the left knee shows acute fracture of distal left femur, orthopedics  consulted.  Patient also found to have A-fib with RVR started on Cardizem  gtt.  Cardiology consulted.  Assessment & Plan:   Principal Problem:   Fall at home, initial encounter  Acute fracture of left distal femur: Status post fall: Patient presented status post mechanical fall at home. X-ray left knee shows acute fracture of distal left femur. Orthopedics is consulted. Continue adequate pain control. Hold anticoagulation. Patient was tentatively scheduled for today, rescheduled for tomorrow due to elevated INR. Patient will get FFP 1 unit.  Now is scheduled for tomorrow.  Paroxysmal A-fib with RVR: EKG shows A-fib with RVR. Coumadin  is on hold. Patient started on Cardizem  gtt. Continue Cardizem  and metoprolol . Continue telemetry. Continue to monitor heart rate. Cardiology is consulted.  Heart rate is now reasonably controlled with Cardizem  gtt.  Acute metabolic encephalopathy likely due to poly pharmacy. Patient has sudden change in the mental status. CT head rule out acute CVA. Continue to monitor clinical status.  Do not know if this may be her baseline. Mental status back to baseline.  CKD stage IIIb: Serum creatinine is at baseline.  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   Continue seizure precautions, fall precautions, aspiration precautions, no AED on medication  list.   Essential hypertension: Continue Cardizem  60 mg q 12hr Continue metoprolol  25 mg  q12hr Continue Aldactone  25 mg daily.   DVT prophylaxis: SCDs Code Status:full code Family Communication: No family at bed side. Disposition Plan:   Status is: Inpatient Remains inpatient appropriate because: Severity of illness.    Consultants:  Cardiology Orthopedics  Procedures: Antimicrobials:  Anti-infectives (From admission, onward)    Start     Dose/Rate Route Frequency Ordered Stop   10/14/23 1100  ceFAZolin  (ANCEF ) IVPB 2g/100 mL premix  Status:  Discontinued        2 g 200 mL/hr over 30 Minutes Intravenous On call to O.R. 10/14/23 0533 10/14/23 1133       Subjective: Patient was seen and examined at bedside. Overnight events noted. Patient seems alert and oriented,  back to her baseline mental status. Her surgery was rescheduled for tomorrow due to elevated INR.  Objective: Vitals:   10/14/23 0809 10/14/23 0929 10/14/23 1026 10/14/23 1116  BP: 137/78 (!) 135/56 (!) 142/86 132/68  Pulse: 86 86 98 83  Resp: 19  20 20   Temp: 99.1 F (37.3 C)  98.2 F (36.8 C) 98.6 F (37 C)  TempSrc: Oral  Oral Oral  SpO2: 93%  92% 92%  Weight:      Height:        Intake/Output Summary (Last 24 hours) at 10/14/2023 1350 Last data filed at 10/14/2023 0551 Gross per 24 hour  Intake 16.92 ml  Output 600 ml  Net -583.08 ml   Filed Weights   10/12/23 1247 10/13/23 1358  Weight: 59 kg 54.2 kg    Examination:  General exam: Appears calm and comfortable, deconditioned,  not in any acute distress. Respiratory system: CTA Bilaterally . Respiratory effort normal.  RR 14 Cardiovascular system: S1 & S2 heard, RRR. No JVD, murmurs, rubs, gallops or clicks.  Gastrointestinal system: Abdomen is non distended, soft and non tender.  Normal bowel sounds heard. Central nervous system: Alert and oriented x 2. No focal neurological deficits. Extremities: Left Hip tenderness + No edema, No  cyanosis. Skin: No rashes, lesions or ulcers Psychiatry:  Mood & affect appropriate.     Data Reviewed: I have personally reviewed following labs and imaging studies  CBC: Recent Labs  Lab 10/12/23 1314 10/13/23 0439  WBC 9.1 9.9  NEUTROABS 7.3  --   HGB 9.1* 8.9*  HCT 29.1* 28.0*  MCV 100.7* 97.6  PLT 211 196   Basic Metabolic Panel: Recent Labs  Lab 10/12/23 1314 10/13/23 0439  NA 133* 134*  K 4.4 4.5  CL 101 101  CO2 22 23  GLUCOSE 120* 128*  BUN 33* 29*  CREATININE 1.76* 1.59*  CALCIUM  8.9 9.0  MG 1.8  --    GFR: Estimated Creatinine Clearance: 20.1 mL/min (A) (by C-G formula based on SCr of 1.59 mg/dL (H)). Liver Function Tests: Recent Labs  Lab 10/12/23 1314 10/13/23 0439  AST 21 19  ALT 13 13  ALKPHOS 72 77  BILITOT 0.6 0.9  PROT 5.9* 6.2*  ALBUMIN 3.4* 3.3*   No results for input(s): LIPASE, AMYLASE in the last 168 hours. No results for input(s): AMMONIA in the last 168 hours. Coagulation Profile: Recent Labs  Lab 10/13/23 1749 10/14/23 0955  INR 2.0* 2.1*   Cardiac Enzymes: No results for input(s): CKTOTAL, CKMB, CKMBINDEX, TROPONINI in the last 168 hours. BNP (last 3 results) No results for input(s): PROBNP in the last 8760 hours. HbA1C: No results for input(s): HGBA1C in the last 72 hours. CBG: No results for input(s): GLUCAP in the last 168 hours. Lipid Profile: No results for input(s): CHOL, HDL, LDLCALC, TRIG, CHOLHDL, LDLDIRECT in the last 72 hours. Thyroid  Function Tests: No results for input(s): TSH, T4TOTAL, FREET4, T3FREE, THYROIDAB in the last 72 hours. Anemia Panel: Recent Labs    10/12/23 1314  FOLATE >20.0   Sepsis Labs: No results for input(s): PROCALCITON, LATICACIDVEN in the last 168 hours.  Recent Results (from the past 240 hours)  Surgical pcr screen     Status: None   Collection Time: 10/13/23  7:07 AM   Specimen: Nasal Mucosa; Nasal Swab  Result Value Ref  Range Status   MRSA, PCR NEGATIVE NEGATIVE Final   Staphylococcus aureus NEGATIVE NEGATIVE Final    Comment: (NOTE) The Xpert SA Assay (FDA approved for NASAL specimens in patients 64 years of age and older), is one component of a comprehensive surveillance program. It is not intended to diagnose infection nor to guide or monitor treatment. Performed at [facility], [address] [place], [place] [address]     Radiology Studies: CT HEAD WO CONTRAST ( ) Result Date: 10/13/2023 EXAM: CT HEAD WITHOUT CONTRAST 10/13/2023 10:09:44 AM TECHNIQUE: CT of the head was performed without the administration of intravenous contrast. Automated exposure control, iterative reconstruction, and/or weight based adjustment of the mA/kV was utilized to reduce the radiation dose to as low as reasonably achievable. COMPARISON: CT head without contrast dated 05/07/2023. CLINICAL HISTORY: Mental status change, unknown cause. ams. FINDINGS: BRAIN AND VENTRICLES: No acute hemorrhage. No evidence of acute infarct. No hydrocephalus. No extra-axial collection. No mass effect or midline shift. Moderate atrophy and white matter  changes are stable. 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 Atherosclerotic calcifications are present in the cavernous carotid arteries bilaterally and at the dural margin of both vertebral arteries. No hyperdense vessel is present. ORBITS: Bilateral lens replacements are noted. The globes and orbits are otherwise within normal limits. SINUSES: Chronic opacification of the right maxillary sinus is again noted. Anterior right ethmoid air cells are chronically opacified. Calcifications are present within nasopacified sinuses. SOFT TISSUES AND SKULL: No acute soft tissue abnormality. No skull fracture. IMPRESSION: 1. No acute intracranial abnormality. 2. Stable moderate atrophy and white matter changes. 3. 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 4. Chronic opacification of the right maxillary sinus and anterior right ethmoid air cells with calcifications. Electronically signed by: Lonni Necessary MD 10/13/2023 10:16 AM EDT RP Workstation: [id number]   Scheduled Meds:  sodium chloride    Intravenous Once   anastrozole   1 mg Oral Daily   docusate sodium   100 mg Oral BID   feeding supplement  237 mL Oral BID BM   loratadine   10 mg Oral Daily   metoprolol  succinate  25 mg Oral BID   multivitamin with minerals  1 tablet Oral Daily   pantoprazole   40 mg Oral Daily   rosuvastatin   10 mg Oral Once per day on Sunday Wednesday   sertraline   75 mg Oral QHS   Continuous Infusions:  diltiazem  (CARDIZEM ) infusion 15 mg/hr (10/14/23 0613)     LOS: 2 days    Time spent: 35 mins    Darcel Dawley, MD [facility]   If 7PM-7AM, please contact night-coverage

## 2023-10-15 ENCOUNTER — Inpatient Hospital Stay (HOSPITAL_COMMUNITY): Admitting: Anesthesiology

## 2023-10-15 ENCOUNTER — Encounter (HOSPITAL_COMMUNITY)
Admission: EM | Disposition: A | Discharge status: Skilled Nursing Facility | Payer: Self-pay | Source: Home / Self Care | Attending: Family Medicine

## 2023-10-15 ENCOUNTER — Inpatient Hospital Stay (HOSPITAL_COMMUNITY)

## 2023-10-15 ENCOUNTER — Encounter (HOSPITAL_COMMUNITY): Payer: Self-pay | Admitting: Internal Medicine

## 2023-10-15 DIAGNOSIS — Y92009 Unspecified place in unspecified non-institutional (private) residence as the place of occurrence of the external cause: Secondary | ICD-10-CM | POA: Diagnosis not present

## 2023-10-15 DIAGNOSIS — I10 Essential (primary) hypertension: Secondary | ICD-10-CM | POA: Diagnosis not present

## 2023-10-15 DIAGNOSIS — N1832 Chronic kidney disease, stage 3b: Secondary | ICD-10-CM

## 2023-10-15 DIAGNOSIS — W19XXXA Unspecified fall, initial encounter: Secondary | ICD-10-CM | POA: Diagnosis not present

## 2023-10-15 DIAGNOSIS — I480 Paroxysmal atrial fibrillation: Secondary | ICD-10-CM

## 2023-10-15 DIAGNOSIS — I129 Hypertensive chronic kidney disease with stage 1 through stage 4 chronic kidney disease, or unspecified chronic kidney disease: Secondary | ICD-10-CM

## 2023-10-15 DIAGNOSIS — M978XXA Periprosthetic fracture around other internal prosthetic joint, initial encounter: Secondary | ICD-10-CM

## 2023-10-15 HISTORY — PX: ORIF PERIPROSTHETIC FRACTURE: SHX5034

## 2023-10-15 LAB — BASIC METABOLIC PANEL WITH GFR
Anion gap: 11 (ref 5–15)
BUN: 34 mg/dL — ABNORMAL HIGH (ref 8–23)
CO2: 22 mmol/L (ref 22–32)
Calcium: 8.7 mg/dL — ABNORMAL LOW (ref 8.9–10.3)
Chloride: 97 mmol/L — ABNORMAL LOW (ref 98–111)
Creatinine, Ser: 1.67 mg/dL — ABNORMAL HIGH (ref 0.44–1.00)
GFR, Estimated: 30 mL/min — ABNORMAL LOW (ref >60)
Glucose, Bld: 112 mg/dL — ABNORMAL HIGH (ref 70–99)
Potassium: 4.2 mmol/L (ref 3.5–5.1)
Sodium: 130 mmol/L — ABNORMAL LOW (ref 135–145)

## 2023-10-15 LAB — BPAM FFP
Blood Product Expiration Date: 202509292359
ISSUE DATE / TIME: 202509251547
Unit Type and Rh: 600

## 2023-10-15 LAB — PREPARE FRESH FROZEN PLASMA: Unit division: 0

## 2023-10-15 LAB — CBC
HCT: 25 % — ABNORMAL LOW (ref 36.0–46.0)
Hemoglobin: 8.1 g/dL — ABNORMAL LOW (ref 12.0–15.0)
MCH: 31.3 pg (ref 26.0–34.0)
MCHC: 32.4 g/dL (ref 30.0–36.0)
MCV: 96.5 fL (ref 80.0–100.0)
Platelets: 188 K/uL (ref 150–400)
RBC: 2.59 MIL/uL — ABNORMAL LOW (ref 3.87–5.11)
RDW: 13 % (ref 11.5–15.5)
WBC: 11.7 K/uL — ABNORMAL HIGH (ref 4.0–10.5)
nRBC: 0 % (ref 0.0–0.2)

## 2023-10-15 LAB — PROTIME-INR
INR: 2 — ABNORMAL HIGH (ref 0.8–1.2)
Prothrombin Time: 23.8 s — ABNORMAL HIGH (ref 11.4–15.2)

## 2023-10-15 LAB — PREPARE RBC (CROSSMATCH)

## 2023-10-15 SURGERY — OPEN REDUCTION INTERNAL FIXATION (ORIF) PERIPROSTHETIC FRACTURE
Anesthesia: General | Laterality: Left

## 2023-10-15 MED ORDER — CEFAZOLIN SODIUM-DEXTROSE 2-3 GM-%(50ML) IV SOLR
INTRAVENOUS | Status: DC | PRN
  Administered 2023-10-15: 2 g via INTRAVENOUS

## 2023-10-15 MED ORDER — CEFAZOLIN SODIUM-DEXTROSE 2-4 GM/100ML-% IV SOLN
INTRAVENOUS | Status: AC
  Filled 2023-10-15: qty 100

## 2023-10-15 MED ORDER — ONDANSETRON HCL 4 MG/2ML IJ SOLN: 4.0000 mg | Freq: Once | INTRAMUSCULAR | Status: DC | PRN

## 2023-10-15 MED ORDER — PROPOFOL 10 MG/ML IV BOLUS
INTRAVENOUS | Status: AC
  Filled 2023-10-15: qty 20

## 2023-10-15 MED ORDER — ONDANSETRON HCL 4 MG/2ML IJ SOLN
INTRAMUSCULAR | Status: DC | PRN
  Administered 2023-10-15: 4 mg via INTRAVENOUS

## 2023-10-15 MED ORDER — LIDOCAINE 2% (20 MG/ML) 5 ML SYRINGE
INTRAMUSCULAR | Status: DC | PRN
  Administered 2023-10-15: 80 mg via INTRAVENOUS

## 2023-10-15 MED ORDER — ONDANSETRON HCL 4 MG/2ML IJ SOLN
INTRAMUSCULAR | Status: AC
  Filled 2023-10-15: qty 2

## 2023-10-15 MED ORDER — SODIUM CHLORIDE 0.9% IV SOLUTION: Freq: Once | INTRAVENOUS | Status: DC

## 2023-10-15 MED ORDER — PROPOFOL 500 MG/50ML IV EMUL
INTRAVENOUS | Status: DC | PRN
  Administered 2023-10-15: 80 ug/kg/min via INTRAVENOUS

## 2023-10-15 MED ORDER — 0.9 % SODIUM CHLORIDE (POUR BTL) OPTIME
TOPICAL | Status: DC | PRN
  Administered 2023-10-15: 1000 mL

## 2023-10-15 MED ORDER — PHENYLEPHRINE HCL-NACL 20-0.9 MG/250ML-% IV SOLN
INTRAVENOUS | Status: DC | PRN
  Administered 2023-10-15: 50 ug/min via INTRAVENOUS

## 2023-10-15 MED ORDER — LACTATED RINGERS IV SOLN: INTRAVENOUS | Status: DC

## 2023-10-15 MED ORDER — DILTIAZEM LOAD VIA INFUSION
5.0000 mg | Freq: Once | INTRAVENOUS | Status: AC
  Administered 2023-10-15: 5 mg via INTRAVENOUS
  Filled 2023-10-15: qty 5

## 2023-10-15 MED ORDER — CHLORHEXIDINE GLUCONATE 0.12 % MT SOLN: 15.0000 mL | Freq: Once | OROMUCOSAL | Status: AC

## 2023-10-15 MED ORDER — VANCOMYCIN HCL 1000 MG IV SOLR
INTRAVENOUS | Status: AC
  Filled 2023-10-15: qty 20

## 2023-10-15 MED ORDER — CHLORHEXIDINE GLUCONATE 0.12 % MT SOLN
OROMUCOSAL | Status: AC
  Administered 2023-10-15: 15 mL via OROMUCOSAL
  Filled 2023-10-15: qty 15

## 2023-10-15 MED ORDER — ROCURONIUM BROMIDE 10 MG/ML (PF) SYRINGE
PREFILLED_SYRINGE | INTRAVENOUS | Status: DC | PRN
  Administered 2023-10-15: 60 mg via INTRAVENOUS

## 2023-10-15 MED ORDER — PHENYLEPHRINE 80 MCG/ML (10ML) SYRINGE FOR IV PUSH (FOR BLOOD PRESSURE SUPPORT)
PREFILLED_SYRINGE | INTRAVENOUS | Status: DC | PRN
  Administered 2023-10-15: 160 ug via INTRAVENOUS

## 2023-10-15 MED ORDER — DEXAMETHASONE SODIUM PHOSPHATE 10 MG/ML IJ SOLN
INTRAMUSCULAR | Status: DC | PRN
  Administered 2023-10-15: 10 mg via INTRAVENOUS

## 2023-10-15 MED ORDER — ONDANSETRON HCL 4 MG/2ML IJ SOLN
INTRAMUSCULAR | Status: AC
Start: 2023-10-15 — End: 2023-10-15
  Filled 2023-10-15: qty 2

## 2023-10-15 MED ORDER — DEXAMETHASONE SODIUM PHOSPHATE 10 MG/ML IJ SOLN
INTRAMUSCULAR | Status: AC
Start: 2023-10-15 — End: 2023-10-15
  Filled 2023-10-15: qty 1

## 2023-10-15 MED ORDER — ONDANSETRON HCL 4 MG/2ML IJ SOLN: 4.0000 mg | Freq: Four times a day (QID) | INTRAMUSCULAR | Status: DC | PRN

## 2023-10-15 MED ORDER — ONDANSETRON HCL 4 MG PO TABS: 4.0000 mg | ORAL_TABLET | Freq: Four times a day (QID) | ORAL | Status: DC | PRN

## 2023-10-15 MED ORDER — VANCOMYCIN HCL 1000 MG IV SOLR
INTRAVENOUS | Status: DC | PRN
  Administered 2023-10-15: 1000 mg via TOPICAL

## 2023-10-15 MED ORDER — CEFAZOLIN SODIUM-DEXTROSE 2-4 GM/100ML-% IV SOLN
2.0000 g | Freq: Two times a day (BID) | INTRAVENOUS | Status: AC
  Administered 2023-10-15 – 2023-10-16 (×2): 2 g via INTRAVENOUS
  Filled 2023-10-15 (×2): qty 100

## 2023-10-15 MED ORDER — FENTANYL CITRATE (PF) 250 MCG/5ML IJ SOLN
INTRAMUSCULAR | Status: DC | PRN
  Administered 2023-10-15 (×2): 50 ug via INTRAVENOUS
  Administered 2023-10-15: 25 ug via INTRAVENOUS
  Administered 2023-10-15: 100 ug via INTRAVENOUS

## 2023-10-15 MED ORDER — PROPOFOL 10 MG/ML IV BOLUS
INTRAVENOUS | Status: DC | PRN
  Administered 2023-10-15 (×2): 10 mg via INTRAVENOUS
  Administered 2023-10-15: 60 mg via INTRAVENOUS
  Administered 2023-10-15: 100 mg via INTRAVENOUS

## 2023-10-15 MED ORDER — ROCURONIUM BROMIDE 10 MG/ML (PF) SYRINGE
PREFILLED_SYRINGE | INTRAVENOUS | Status: AC
  Filled 2023-10-15: qty 10

## 2023-10-15 MED ORDER — METOCLOPRAMIDE HCL 5 MG PO TABS: 5.0000 mg | ORAL_TABLET | Freq: Three times a day (TID) | ORAL | Status: DC | PRN

## 2023-10-15 MED ORDER — METOCLOPRAMIDE HCL 5 MG/ML IJ SOLN: 5.0000 mg | Freq: Three times a day (TID) | INTRAMUSCULAR | Status: DC | PRN

## 2023-10-15 MED ORDER — MORPHINE SULFATE (PF) 2 MG/ML IV SOLN: 0.5000 mg | INTRAVENOUS | Status: DC | PRN

## 2023-10-15 MED ORDER — ACETAMINOPHEN 325 MG PO TABS
325.0000 mg | ORAL_TABLET | Freq: Four times a day (QID) | ORAL | Status: DC | PRN
Start: 2023-10-15 — End: 2023-10-20

## 2023-10-15 MED ORDER — ORAL CARE MOUTH RINSE: 15.0000 mL | Freq: Once | OROMUCOSAL | Status: AC

## 2023-10-15 MED ORDER — HYDROCODONE-ACETAMINOPHEN 5-325 MG PO TABS
1.0000 | ORAL_TABLET | Freq: Four times a day (QID) | ORAL | Status: DC | PRN
  Administered 2023-10-16: 1 via ORAL
  Filled 2023-10-15: qty 1

## 2023-10-15 MED ORDER — PHENYLEPHRINE 80 MCG/ML (10ML) SYRINGE FOR IV PUSH (FOR BLOOD PRESSURE SUPPORT)
PREFILLED_SYRINGE | INTRAVENOUS | Status: AC
  Filled 2023-10-15: qty 10

## 2023-10-15 MED ORDER — LIDOCAINE 2% (20 MG/ML) 5 ML SYRINGE
INTRAMUSCULAR | Status: AC
  Filled 2023-10-15: qty 5

## 2023-10-15 MED ORDER — SUGAMMADEX SODIUM 200 MG/2ML IV SOLN
INTRAVENOUS | Status: DC | PRN
  Administered 2023-10-15: 110 mg via INTRAVENOUS

## 2023-10-15 MED ORDER — FENTANYL CITRATE (PF) 100 MCG/2ML IJ SOLN: 25.0000 ug | INTRAMUSCULAR | Status: DC | PRN

## 2023-10-15 MED ORDER — FENTANYL CITRATE (PF) 250 MCG/5ML IJ SOLN
INTRAMUSCULAR | Status: AC
  Filled 2023-10-15: qty 5

## 2023-10-15 SURGICAL SUPPLY — 47 items
BAG COUNTER SPONGE SURGICOUNT (BAG) ×1 IMPLANT
BIT DRILL LONG 3.3 (BIT) IMPLANT
BIT DRILL QC 3.3X195 (BIT) IMPLANT
BNDG COHESIVE 6X5 TAN ST LF (GAUZE/BANDAGES/DRESSINGS) ×1 IMPLANT
BNDG ELASTIC 6X10 VLCR STRL LF (GAUZE/BANDAGES/DRESSINGS) ×1 IMPLANT
BRUSH SCRUB EZ PLAIN DRY (MISCELLANEOUS) ×2 IMPLANT
CANISTER SUCTION 3000ML PPV (SUCTIONS) ×1 IMPLANT
CAP LOCK NCB (Cap) IMPLANT
CHLORAPREP W/TINT 26 (MISCELLANEOUS) ×1 IMPLANT
COVER SURGICAL LIGHT HANDLE (MISCELLANEOUS) ×1 IMPLANT
DRAPE C-ARM 42X72 X-RAY (DRAPES) ×1 IMPLANT
DRAPE C-ARMOR (DRAPES) ×1 IMPLANT
DRAPE HALF SHEET 40X57 (DRAPES) ×2 IMPLANT
DRAPE SURG 17X23 STRL (DRAPES) ×1 IMPLANT
DRAPE SURG ORHT 6 SPLT 77X108 (DRAPES) ×2 IMPLANT
DRAPE U-SHAPE 47X51 STRL (DRAPES) ×1 IMPLANT
DRSG MEPITEL 4X7.2 (GAUZE/BANDAGES/DRESSINGS) IMPLANT
ELECTRODE REM PT RTRN 9FT ADLT (ELECTROSURGICAL) ×1 IMPLANT
GAUZE PAD ABD 8X10 STRL (GAUZE/BANDAGES/DRESSINGS) ×3 IMPLANT
GAUZE SPONGE 4X4 12PLY STRL (GAUZE/BANDAGES/DRESSINGS) ×1 IMPLANT
GLOVE BIO SURGEON STRL SZ 6.5 (GLOVE) ×3 IMPLANT
GLOVE BIO SURGEON STRL SZ7.5 (GLOVE) ×4 IMPLANT
GLOVE BIOGEL PI IND STRL 6.5 (GLOVE) ×1 IMPLANT
GLOVE BIOGEL PI IND STRL 7.5 (GLOVE) ×1 IMPLANT
GOWN STRL REUS W/ TWL LRG LVL3 (GOWN DISPOSABLE) ×3 IMPLANT
KIT BASIN OR (CUSTOM PROCEDURE TRAY) ×1 IMPLANT
KIT TURNOVER KIT B (KITS) ×1 IMPLANT
KWIRE FXSTD 280X2XNS SS (WIRE) IMPLANT
PACK TOTAL JOINT (CUSTOM PROCEDURE TRAY) ×1 IMPLANT
PAD ARMBOARD POSITIONER FOAM (MISCELLANEOUS) ×1 IMPLANT
PAD CAST 4YDX4 CTTN HI CHSV (CAST SUPPLIES) ×1 IMPLANT
PADDING CAST ABS COTTON 4X4 ST (CAST SUPPLIES) IMPLANT
PADDING CAST COTTON 6X4 STRL (CAST SUPPLIES) ×1 IMPLANT
PLATE DIST FEM 12H (Plate) IMPLANT
SCREW 5.0 80MM (Screw) IMPLANT
SCREW NCB 3.5X75X5X6.2XST (Screw) IMPLANT
SCREW NCB 4.0X36MM (Screw) IMPLANT
SCREW NCB 5.0X85MM (Screw) IMPLANT
SOLN 0.9% NACL 1000 ML (IV SOLUTION) ×1 IMPLANT
SOLN 0.9% NACL POUR BTL 1000ML (IV SOLUTION) ×1 IMPLANT
SOLN STERILE WATER 1000 ML (IV SOLUTION) ×2 IMPLANT
SOLN STERILE WATER BTL 1000 ML (IV SOLUTION) ×2 IMPLANT
STAPLER SKIN PROX 35W (STAPLE) ×1 IMPLANT
SUCTION TUBE FRAZIER 10FR DISP (SUCTIONS) ×1 IMPLANT
SUT ETHILON 3 0 PS 1 (SUTURE) ×2 IMPLANT
SUT VIC AB 2-0 CT1 TAPERPNT 27 (SUTURE) ×2 IMPLANT
TOWEL GREEN STERILE (TOWEL DISPOSABLE) ×2 IMPLANT

## 2023-10-15 NOTE — Anesthesia Preprocedure Evaluation (Addendum)
 Anesthesia Evaluation  Patient identified by MRN, date of birth, ID band Patient awake and Patient confused    Reviewed: Allergy & Precautions, NPO status , Patient's Chart, lab work & pertinent test results  Airway Mallampati: III     Mouth opening: Limited Mouth Opening Comment: Receding mandible Dental no notable dental hx. (+) Caps, Dental Advisory Given   Pulmonary pneumonia, resolved   Pulmonary exam normal breath sounds clear to auscultation       Cardiovascular hypertension, Pt. on medications + dysrhythmias Atrial Fibrillation  Rhythm:Regular Rate:Normal  EKG 10/13/23 Atrial flutter with variable block  Echo 07/29/23 1. Left ventricular ejection fraction, by estimation, is 60 to 65%. The  left ventricle has normal function. The left ventricle has no regional  wall motion abnormalities. Left ventricular diastolic parameters are  indeterminate.   2. Right ventricular systolic function is normal. The right ventricular  size is mildly enlarged. There is moderately elevated pulmonary artery  systolic pressure. The estimated right ventricular systolic pressure is  52.5 mmHg.   3. Left atrial size was moderately dilated.   4. Right atrial size was moderately dilated.   5. The mitral valve is degenerative. Moderate mitral valve regurgitation.  No evidence of mitral stenosis.   6. Tricuspid valve regurgitation is moderate.   7. The aortic valve is tricuspid. Aortic valve regurgitation is trivial.  No aortic stenosis is present.   8. The inferior vena cava is dilated in size with <50% respiratory  variability, suggesting right atrial pressure of 15 mmHg.      Neuro/Psych Seizures -, Well Controlled,  PSYCHIATRIC DISORDERS  Depression     Neuromuscular disease    GI/Hepatic negative GI ROS, Neg liver ROS,,,  Endo/Other  negative endocrine ROS  HLD  Renal/GU Renal InsufficiencyRenal disease  negative genitourinary    Musculoskeletal  (+) Arthritis , Osteoarthritis,  Periprosthetic distal femur Fx   Abdominal   Peds  Hematology  (+) Blood dyscrasia, anemia Hb- 8.1 after transfusion of 1 unit of PRBC Coumadin  therapy- last dose 9/22 INR 2.0 PT 23.8   Anesthesia Other Findings   Reproductive/Obstetrics                              Anesthesia Physical Anesthesia Plan  ASA: 3  Anesthesia Plan: General   Post-op Pain Management: Dilaudid  IV   Induction: Intravenous  PONV Risk Score and Plan: 4 or greater and Treatment may vary due to age or medical condition, TIVA, Propofol  infusion and Ondansetron   Airway Management Planned: Oral ETT  Additional Equipment: None  Intra-op Plan:   Post-operative Plan: Extubation in OR  Informed Consent: I have reviewed the patients History and Physical, chart, labs and discussed the procedure including the risks, benefits and alternatives for the proposed anesthesia with the patient or authorized representative who has indicated his/her understanding and acceptance.     Dental advisory given  Plan Discussed with: Anesthesiologist and CRNA  Anesthesia Plan Comments:          Anesthesia Quick Evaluation

## 2023-10-15 NOTE — Progress Notes (Signed)
 Orthopedic Tech Progress Note Patient Details:  Brandi [name] November 12, 1940 [id number]  Patient ID: Brandi [name], female   DOB: 1940/08/05, 83 y.o.   MRN: [id number] No OHF. Age restricted. Brandi [name] Brandi [name] 10/15/2023, 2:16 PM

## 2023-10-15 NOTE — Anesthesia Procedure Notes (Signed)
 Procedure Name: Intubation Date/Time: 10/15/2023 10:34 AM  Performed by: Myrna Homer, CRNAPre-anesthesia Checklist: Patient identified, Emergency Drugs available, Suction available and Patient being monitored Patient Re-evaluated:Patient Re-evaluated prior to induction Oxygen Delivery Method: Circle System Utilized Preoxygenation: Pre-oxygenation with 100% oxygen Induction Type: IV induction Ventilation: Mask ventilation without difficulty Laryngoscope Size: Mac and 3 Grade View: Grade II Tube type: Oral Tube size: 7.0 mm Number of attempts: 1 Airway Equipment and Method: Stylet and Oral airway Placement Confirmation: ETT inserted through vocal cords under direct vision, positive ETCO2 and breath sounds checked- equal and bilateral Secured at: 20 cm Tube secured with: Tape Dental Injury: Teeth and Oropharynx as per pre-operative assessment

## 2023-10-15 NOTE — Progress Notes (Signed)
 PROGRESS NOTE    Brandi [name]  FMW:[id number] DOB: [birth year] DOA: 10/12/2023 PCP: Addie Camellia CROME, MD   Brief Narrative:  This 82 years old female with PMH significant for history of A-fib, hyperlipidemia, essential hypertension, obesity, history of subdural hematoma, CKD stage IIIb with baseline creatinine 1.3-1.6, history of seizures, history of falls presented to the ED with complaints of left hip pain after fall at home yesterday.  X-ray of the left hip negative for acute fracture or dislocation.  X-ray of the left knee shows acute fracture of distal left femur, orthopedics  consulted.  Patient also found to have A-fib with RVR started on Cardizem  gtt.  Cardiology consulted.  Assessment & Plan:   Principal Problem:   Fall at home, initial encounter  Acute fracture of left distal femur: Status post fall: Patient presented status post mechanical fall at home. X-ray left knee showed acute fracture of distal left femur. Orthopedics is consulted. Continue adequate pain control. Hold anticoagulation. Patient was tentatively scheduled for yesterday but it was rescheduled for today due to elevated INR Patient received FFP 1 unit.  Now status post ORIF POD #1. Post operative PT and OT recommendation.  Paroxysmal A-fib with RVR: EKG shows A-fib with RVR. Coumadin  is on hold. Patient started on Cardizem  gtt. Continue Cardizem  and metoprolol . Continue telemetry. Continue to monitor heart rate. Cardiology is consulted.  Heart rate is now reasonably controlled with Cardizem  gtt. Follow-up heart rate.  Changed to p.o. Cardizem  before discharge.  Acute metabolic encephalopathy likely due to poly pharmacy. Patient has sudden change in the mental status. CT head rule out acute CVA. Continue to monitor clinical status.  Do not know if this may be her baseline. Mental status back to baseline.  CKD stage IIIb: Serum creatinine is at baseline.  Seizure disorder: Secondary to history of  subdural hematoma.   Continue seizure precautions, fall precautions, aspiration precautions, no AED on medication list.   Essential hypertension: Continue Cardizem  60 mg q 12hr Continue metoprolol  25 mg  q12hr Continue Aldactone  25 mg daily.   DVT prophylaxis: SCDs Code Status:full code Family Communication: No family at bed side. Disposition Plan:   Status is: Inpatient Remains inpatient appropriate because: Severity of illness.    Consultants:  Cardiology Orthopedics  Procedures: Antimicrobials:  Anti-infectives (From admission, onward)    Start     Dose/Rate Route Frequency Ordered Stop   10/15/23 2200  ceFAZolin  (ANCEF ) IVPB 2g/100 mL premix        2 g 200 mL/hr over 30 Minutes Intravenous Every 12 hours 10/15/23 1336 10/16/23 2159   10/15/23 1120  vancomycin  (VANCOCIN ) powder  Status:  Discontinued          As needed 10/15/23 1120 10/15/23 1154   10/15/23 0936  ceFAZolin  (ANCEF ) 2-4 GM/100ML-% IVPB       Note to Pharmacy: LORILEE POCK: cabinet override      10/15/23 0936 10/15/23 2144   10/14/23 1100  ceFAZolin  (ANCEF ) IVPB 2g/100 mL premix  Status:  Discontinued        2 g 200 mL/hr over 30 Minutes Intravenous On call to O.R. 10/14/23 0533 10/14/23 1133       Subjective: Patient was seen and examined at bedside. Overnight events noted. Patient is status post ORIF.  POD #1.  Patient reports pain is there but has received the pain medicine.  Objective: Vitals:   10/15/23 1230 10/15/23 1245 10/15/23 1300 10/15/23 1315  BP: (!) 159/96 (!) 171/99 (!) 151/97 (!) 154/94  Pulse: (!) 116 (!) 117 (!) 105 (!) 111  Resp: 15 15 (!) 21 14  Temp:    98.2 F (36.8 C)  TempSrc:    Oral  SpO2: 95% 92% 92% 95%  Weight:      Height:        Intake/Output Summary (Last 24 hours) at 10/15/2023 1425 Last data filed at 10/15/2023 1141 Gross per 24 hour  Intake 1407.18 ml  Output 575 ml  Net 832.18 ml   Filed Weights   10/12/23 1247 10/13/23 1358 10/15/23 0922  Weight:  59 kg 54.2 kg 54.2 kg    Examination:  General exam: Appears calm and comfortable, deconditioned, not in any acute distress. Respiratory system: CTA Bilaterally . Respiratory effort normal.  RR 15 Cardiovascular system: S1 & S2 heard, RRR. No JVD, murmurs, rubs, gallops or clicks.  Gastrointestinal system: Abdomen is non distended, soft and non tender.  Normal bowel sounds heard. Central nervous system: Alert and oriented x 2. No focal neurological deficits. Extremities: Left Hip tenderness +, s/p ORIF POD# 1 . Skin: No rashes, lesions or ulcers Psychiatry:  Mood & affect appropriate.     Data Reviewed: I have personally reviewed following labs and imaging studies  CBC: Recent Labs  Lab 10/12/23 1314 10/13/23 0439 10/15/23 0249  WBC 9.1 9.9 11.7*  NEUTROABS 7.3  --   --   HGB 9.1* 8.9* 8.1*  HCT 29.1* 28.0* 25.0*  MCV 100.7* 97.6 96.5  PLT 211 196 188   Basic Metabolic Panel: Recent Labs  Lab 10/12/23 1314 10/13/23 0439 10/15/23 0249  NA 133* 134* 130*  K 4.4 4.5 4.2  CL 101 101 97*  CO2 22 23 22   GLUCOSE 120* 128* 112*  BUN 33* 29* 34*  CREATININE 1.76* 1.59* 1.67*  CALCIUM  8.9 9.0 8.7*  MG 1.8  --   --    GFR: Estimated Creatinine Clearance: 19.2 mL/min (A) (by C-G formula based on SCr of 1.67 mg/dL (H)). Liver Function Tests: Recent Labs  Lab 10/12/23 1314 10/13/23 0439  AST 21 19  ALT 13 13  ALKPHOS 72 77  BILITOT 0.6 0.9  PROT 5.9* 6.2*  ALBUMIN 3.4* 3.3*   No results for input(s): LIPASE, AMYLASE in the last 168 hours. No results for input(s): AMMONIA in the last 168 hours. Coagulation Profile: Recent Labs  Lab 10/13/23 1749 10/14/23 0955 10/15/23 0249  INR 2.0* 2.1* 2.0*   Cardiac Enzymes: No results for input(s): CKTOTAL, CKMB, CKMBINDEX, TROPONINI in the last 168 hours. BNP (last 3 results) No results for input(s): PROBNP in the last 8760 hours. HbA1C: No results for input(s): HGBA1C in the last 72 hours. CBG: No  results for input(s): GLUCAP in the last 168 hours. Lipid Profile: No results for input(s): CHOL, HDL, LDLCALC, TRIG, CHOLHDL, LDLDIRECT in the last 72 hours. Thyroid  Function Tests: No results for input(s): TSH, T4TOTAL, FREET4, T3FREE, THYROIDAB in the last 72 hours. Anemia Panel: No results for input(s): VITAMINB12, FOLATE, FERRITIN, TIBC, IRON, RETICCTPCT in the last 72 hours.  Sepsis Labs: No results for input(s): PROCALCITON, LATICACIDVEN in the last 168 hours.  Recent Results (from the past 240 hours)  Surgical pcr screen     Status: None   Collection Time: 10/13/23  7:07 AM   Specimen: Nasal Mucosa; Nasal Swab  Result Value Ref Range Status   MRSA, PCR NEGATIVE NEGATIVE Final   Staphylococcus aureus NEGATIVE NEGATIVE Final    Comment: (NOTE) The Xpert SA Assay (FDA approved for NASAL specimens  in patients 56 years of age and older), is one component of a comprehensive surveillance program. It is not intended to diagnose infection nor to guide or monitor treatment. Performed at [facility], [address], [place], [place] [address]     Radiology Studies: DG C-Arm 1-60 Min-No Report Result Date: 10/15/2023 Fluoroscopy was utilized by the requesting physician.  No radiographic interpretation.   Scheduled Meds:  sodium chloride    Intravenous Once   anastrozole   1 mg Oral Daily   docusate sodium   100 mg Oral BID   feeding supplement  237 mL Oral BID BM   loratadine   10 mg Oral Daily   metoprolol  succinate  25 mg Oral BID   multivitamin with minerals  1 tablet Oral Daily   pantoprazole   40 mg Oral Daily   rosuvastatin   10 mg Oral Once per day on Sunday Wednesday   sertraline   75 mg Oral QHS   Continuous Infusions:  ceFAZolin       ceFAZolin  (ANCEF ) IV     diltiazem  (CARDIZEM ) infusion 5 mg/hr (10/15/23 1259)     LOS: 3 days    Time spent: 35 mins    Darcel Dawley, MD [facility]   If 7PM-7AM, please  contact night-coverage

## 2023-10-15 NOTE — Transfer of Care (Signed)
 Immediate Anesthesia Transfer of Care Note  Patient: Brandi [name]  Procedure(s) Performed: OPEN REDUCTION INTERNAL FIXATION (ORIF) PERIPROSTHETIC FRACTURE (Left)  Patient Location: PACU  Anesthesia Type:General  Level of Consciousness: drowsy, patient cooperative, and responds to stimulation  Airway & Oxygen Therapy: Patient Spontanous Breathing and Patient connected to nasal cannula oxygen  Post-op Assessment: Report given to RN and Post -op Vital signs reviewed and stable  Post vital signs: Reviewed and stable  Last Vitals:  Vitals Value Taken Time  BP 155/98 10/15/23 11:56  Temp    Pulse 100 10/15/23 11:59  Resp 14 10/15/23 11:59  SpO2 96 % 10/15/23 11:59  Vitals shown include unfiled device data.  Last Pain:  Vitals:   10/15/23 0922  TempSrc: Oral  PainSc:          Complications: No notable events documented.

## 2023-10-15 NOTE — Anesthesia Postprocedure Evaluation (Signed)
 Anesthesia Post Note  Patient: Brandi [name]  Procedure(s) Performed: OPEN REDUCTION INTERNAL FIXATION (ORIF) PERIPROSTHETIC FRACTURE (Left)     Patient location during evaluation: PACU Anesthesia Type: General Level of consciousness: awake and alert and oriented Pain management: pain level controlled Vital Signs Assessment: post-procedure vital signs reviewed and stable Respiratory status: spontaneous breathing, nonlabored ventilation and respiratory function stable Cardiovascular status: blood pressure returned to baseline and stable Postop Assessment: no apparent nausea or vomiting Anesthetic complications: no   No notable events documented.  Last Vitals:  Vitals:   10/15/23 1200 10/15/23 1215  BP: (!) 164/75 (!) 162/73  Pulse: 96 (!) 113  Resp: 14 14  Temp:    SpO2: 98% 94%    Last Pain:  Vitals:   10/15/23 1200  TempSrc:   PainSc: Asleep                 [name] A.

## 2023-10-15 NOTE — Progress Notes (Signed)
 Attempted to reach daughter to update that patient being sent to sx unable to reach daughter.

## 2023-10-15 NOTE — Plan of Care (Signed)
  Problem: Education: Goal: Knowledge of General Education information will improve Description: Including pain rating scale, medication(s)/side effects and non-pharmacologic comfort measures Outcome: Not Progressing   Problem: Clinical Measurements: Goal: Ability to maintain clinical measurements within normal limits will improve Outcome: Progressing Goal: Diagnostic test results will improve Outcome: Progressing   Problem: Pain Managment: Goal: General experience of comfort will improve and/or be controlled Outcome: Progressing   Problem: Safety: Goal: Ability to remain free from injury will improve Outcome: Progressing

## 2023-10-15 NOTE — Op Note (Signed)
 Orthopaedic Surgery Operative Note (CSN: [id number] ) Date of Surgery: 10/15/2023  Admit Date: 10/12/2023   Diagnoses: Pre-Op Diagnoses: Left periprosthetic distal femur fracture  Post-Op Diagnosis: Same  Procedures: CPT 27511-Open reduction internal fixation of left supracondylar distal femur fracture CPT 20680-Removal of hardware left distal femur fracture  Surgeons : Primary: Kendal Franky SQUIBB, MD  Assistant: Lauraine Moores, PA-C  Location: OR 3   Anesthesia: General   Antibiotics: Ancef  2g preop with 1gm vancomycin  powder placed topically   Tourniquet time: None    Estimated Blood Loss: 250 mL  Complications:* No complications entered in OR log *   Specimens:* No specimens in log *   Implants: Implant Name Type Inv. Item Serial No. Manufacturer Lot No. LRB No. Used Action  CAP LOCK NCB - [id number] Cap CAP LOCK NCB  [employer](ORTH,TRAU,BIO,SG)  Left 7 Implanted  PLATE DIST FEM 87Y - [id number] Plate PLATE DIST FEM 87Y  [employer](ORTH,TRAU,BIO,SG)  Left 1 Implanted  SCREW NCB 5.0X85MM - [id number] Screw SCREW NCB 5.0X85MM  [employer](ORTH,TRAU,BIO,SG)  Left 3 Implanted  SCREW 5.0 - [id number] Screw SCREW 5.0  [employer](ORTH,TRAU,BIO,SG)  Left 2 Implanted  SCREW NCB 4.0X36MM - [id number] Screw SCREW NCB 4.0X36MM  [employer](ORTH,TRAU,BIO,SG)  Left 4 Implanted     Indications for Surgery: 83 year old female who sustained a ground-level fall with a left periprosthetic distal femur fracture between knee replacement and hip nail.  Due to the unstable nature of her injury I recommend proceeding with open reduction internal fixation.  Risks and benefits were discussed with the patient's family.  Risks include but not limited to bleeding, infection, malunion, nonunion, hardware failure, hardware rotation, nerve and blood vessel injury, DVT, even the possibility anesthetic complications.  They agreed to proceed with surgery and consent was obtained.  Operative  Findings: 1.  Open reduction internal fixation of left periprosthetic distal femur fracture using [employer] NCB distal femoral locking plate. 2.  Removal of distal interlocking screw from previous cephalomedullary nail of the femur.  Procedure: The patient was identified in the preoperative holding area. Consent was confirmed with the patient and their family and all questions were answered. The operative extremity was marked after confirmation with the patient. she was then brought back to the operating room by our anesthesia colleagues.  She was placed under general anesthetic and carefully transferred over to radiolucent flattop table.  A bump was placed under her operative hip.  The left lower extremity was then prepped and draped in usual sterile fashion.  A timeout was performed to verify the patient, the procedure, and the extremity.  Preoperative antibiotics were dosed.  Fluoroscopic imaging showed the unstable nature of her injury.  The hip and knee were flexed over a triangle.  Traction was applied and the triangle was manipulated to get the fracture aligned appropriately.  I then made a lateral approach to the distal femur carried it down through skin subcutaneous tissue.  I incised through the IT band and visualized the lateral condyle of the femur.  I then remove the distal interlocking screw from the previous cephalomedullary nail.  I then chose a 12 hole [employer] NCB distal femoral locking plate.  I slid this submuscularly along the lateral cortex of the femur.  I then held it provisionally distally with a 2.0 mm guidewire.  I then percutaneously placed a 3.3 mm drill bit posterior to the femoral nail.  Once I confirmed adequate alignment I then drilled and placed 5.0 millimeter screws distally.  I then placed 4.0 millimeter screws in the femoral shaft.  A total of 4 screws were placed.  Locking caps were placed on the distal 2 screws.  The targeting arm was removed and a total of five  5.0 millimeter screws were placed distally.  Locking caps were placed in all the distal screws.  Final fluoroscopic imaging was obtained.  The incisions were copiously irrigated.  A gram of vancomycin  powder was placed into the incision.  A layered closure of 0 Vicryl, 2-0 Monocryl and 3-0 nylon was used to close the skin.  Sterile dressings were applied.  The patient was then awoken from anesthesia and taken to the PACU in stable condition.  Post Op Plan/Instructions: Patient will be weightbearing as tolerated to the left lower extremity.  I would recommend a knee immobilizer for the first 1 to 2 weeks during ambulation and when transferring from the bed due to the poor bone quality she has distally.  She will receive postoperative Ancef .  She may be restarted on her Coumadin  postoperative day 1 if her hemoglobin is stable.  I was present and performed the entire surgery.  Lauraine Moores, PA-C did assist me throughout the case. An assistant was necessary given the difficulty in approach, maintenance of reduction and ability to instrument the fracture.   Franky Light, MD [facility]

## 2023-10-15 NOTE — Interval H&P Note (Signed)
 History and Physical Interval Note:  10/15/2023 9:40 AM  Brandi [name]  has presented today for surgery, with the diagnosis of LEFT FEMUR FRACTURE.  The various methods of treatment have been discussed with the patient and family. After consideration of risks, benefits and other options for treatment, the patient has consented to  Procedure(s) with comments: OPEN REDUCTION INTERNAL FIXATION (ORIF) PERIPROSTHETIC FRACTURE (Left) - LEFT DISTAL FEMUR FRACTURE as a surgical intervention.  The patient's history has been reviewed, patient examined, no change in status, stable for surgery.  I have reviewed the patient's chart and labs.  Questions were answered to the patient's satisfaction.     Jethro Radke P Vallarie Fei

## 2023-10-15 NOTE — Care Management Important Message (Signed)
 Important Message  Patient Details  Name: Brandi [name] MRN: [id number] Date of Birth: [birth year]   Important Message Given:  Yes - Medicare IM     [name] 10/15/2023, 3:34 PM

## 2023-10-16 DIAGNOSIS — Z7901 Long term (current) use of anticoagulants: Secondary | ICD-10-CM

## 2023-10-16 DIAGNOSIS — I10 Essential (primary) hypertension: Secondary | ICD-10-CM | POA: Diagnosis not present

## 2023-10-16 DIAGNOSIS — Y92009 Unspecified place in unspecified non-institutional (private) residence as the place of occurrence of the external cause: Secondary | ICD-10-CM | POA: Diagnosis not present

## 2023-10-16 DIAGNOSIS — N1832 Chronic kidney disease, stage 3b: Secondary | ICD-10-CM | POA: Diagnosis not present

## 2023-10-16 DIAGNOSIS — I4891 Unspecified atrial fibrillation: Secondary | ICD-10-CM

## 2023-10-16 DIAGNOSIS — W19XXXA Unspecified fall, initial encounter: Secondary | ICD-10-CM | POA: Diagnosis not present

## 2023-10-16 LAB — MAGNESIUM: Magnesium: 1.9 mg/dL (ref 1.7–2.4)

## 2023-10-16 LAB — CBC
HCT: 22.5 % — ABNORMAL LOW (ref 36.0–46.0)
Hemoglobin: 7.4 g/dL — ABNORMAL LOW (ref 12.0–15.0)
MCH: 31.5 pg (ref 26.0–34.0)
MCHC: 32.9 g/dL (ref 30.0–36.0)
MCV: 95.7 fL (ref 80.0–100.0)
Platelets: 199 K/uL (ref 150–400)
RBC: 2.35 MIL/uL — ABNORMAL LOW (ref 3.87–5.11)
RDW: 12.7 % (ref 11.5–15.5)
WBC: 10.2 K/uL (ref 4.0–10.5)
nRBC: 0 % (ref 0.0–0.2)

## 2023-10-16 LAB — BASIC METABOLIC PANEL WITH GFR
Anion gap: 15 (ref 5–15)
BUN: 32 mg/dL — ABNORMAL HIGH (ref 8–23)
CO2: 21 mmol/L — ABNORMAL LOW (ref 22–32)
Calcium: 8.7 mg/dL — ABNORMAL LOW (ref 8.9–10.3)
Chloride: 96 mmol/L — ABNORMAL LOW (ref 98–111)
Creatinine, Ser: 1.48 mg/dL — ABNORMAL HIGH (ref 0.44–1.00)
GFR, Estimated: 35 mL/min — ABNORMAL LOW (ref >60)
Glucose, Bld: 132 mg/dL — ABNORMAL HIGH (ref 70–99)
Potassium: 4.5 mmol/L (ref 3.5–5.1)
Sodium: 132 mmol/L — ABNORMAL LOW (ref 135–145)

## 2023-10-16 LAB — PROTIME-INR
INR: 2.1 — ABNORMAL HIGH (ref 0.8–1.2)
Prothrombin Time: 24.6 s — ABNORMAL HIGH (ref 11.4–15.2)

## 2023-10-16 LAB — PHOSPHORUS: Phosphorus: 3.7 mg/dL (ref 2.5–4.6)

## 2023-10-16 LAB — VITAMIN D 25 HYDROXY (VIT D DEFICIENCY, FRACTURES): Vit D, 25-Hydroxy: 52.07 ng/mL (ref 30–100)

## 2023-10-16 MED ORDER — WARFARIN SODIUM 2 MG PO TABS
2.0000 mg | ORAL_TABLET | Freq: Once | ORAL | Status: AC
Start: 2023-10-16 — End: 2023-10-16
  Administered 2023-10-16: 2 mg via ORAL
  Filled 2023-10-16: qty 1

## 2023-10-16 MED ORDER — WARFARIN - PHARMACIST DOSING INPATIENT: Freq: Every day | Status: DC

## 2023-10-16 MED ORDER — DILTIAZEM HCL ER 60 MG PO CP12
60.0000 mg | ORAL_CAPSULE | Freq: Two times a day (BID) | ORAL | Status: DC
  Administered 2023-10-16 – 2023-10-19 (×7): 60 mg via ORAL
  Filled 2023-10-16 (×9): qty 1

## 2023-10-16 NOTE — Progress Notes (Addendum)
  Progress Note  Patient Name: Brandi [name] Date of Encounter: 10/16/2023 [facility] Cardiologist: Evalene Lunger, MD   Interval Summary   Doing well rate controlled atrial fibrillation.   Hemoglobin has dropped to 7.4.  Completed ORIF.  Mildly confused.   Vital Signs Vitals:   10/15/23 2300 10/16/23 0332 10/16/23 0700 10/16/23 0733  BP: (!) 152/75 135/87 133/88 (!) 147/92  Pulse: (!) 102 90 (!) 101 97  Resp: 19 14 12 17   Temp: 98.3 F (36.8 C) 98.1 F (36.7 C)  98.4 F (36.9 C)  TempSrc: Oral Oral  Oral  SpO2: 99% 95% 96% 97%  Weight:      Height:        Intake/Output Summary (Last 24 hours) at 10/16/2023 0815 Last data filed at 10/16/2023 0735 Gross per 24 hour  Intake 1079.62 ml  Output 850 ml  Net 229.62 ml      10/15/2023    9:22 AM 10/13/2023    1:58 PM 10/12/2023   12:47 PM  Last 3 Weights  Weight (lbs) 119 lb 7.8 oz 119 lb 7.8 oz 130 lb  Weight (kg) 54.2 kg 54.2 kg 58.968 kg      Telemetry/ECG  Atrial fibrillation heart rates in the 90s- Personally Reviewed  Physical Exam  GEN: No acute distress.  Mildly confused Neck: No JVD Cardiac: Irregularly irregular, no murmurs, rubs, or gallops.  Respiratory: Clear to auscultation bilaterally. GI: Soft, nontender, non-distended  MS: No edema  Assessment & Plan   Atrial fibrillation with rapid ventricular response - Agree with discontinuation of IV diltiazem  stopped this morning.  Currently on metoprolol  succinate 25 mg twice a day for rate as well as Cardizem  60 mg every 12 hours.  I stopped her IV Cardizem  drip at 5 mg.  I made sure that her Cardizem  60 mg SR every 12 hours was reordered.  Discussed with nursing.  Last heart rate around 110-120.  Chronic anticoagulation - Coumadin  per pharmacy.  This has been resumed.  Note, she did receive 1 unit of FFP prior to surgery.  Chronic kidney disease stage IIIb - Continue to monitor creatinine.  She is currently on Aldactone  25 mg daily, no evidence  of hyperkalemia.    Anemia - Hemoglobin 7.4.  Per primary team.  Postop.  They are monitoring closely.  We will go ahead and sign off.  Please let us  know if we can be of further assistance.    For questions or updates, please contact [facility] Please consult [email or web address] for contact info under         Signed, Oneil Parchment, MD

## 2023-10-16 NOTE — Evaluation (Signed)
 Physical Therapy Evaluation Patient Details Name: Brandi [name] MRN: [id number] DOB: 08/28/1940 Today's Date: 10/16/2023  History of Present Illness  83 years old female presented to the ED 9/23 with complaints of left hip pain after fall at home. X-ray of the left knee shows acute fracture of distal left femur. S/p OPEN REDUCTION INTERNAL FIXATION LEFT PERIPROSTHETIC FRACTURE 9/26. With PMH significant for history of A-fib, hyperlipidemia, essential hypertension, obesity, history of subdural hematoma, CKD stage IIIb , seizures, falls.   Clinical Impression  Patient is s/p above surgery presenting with functional limitations due to the deficits listed below (see PT Problem List). Limited historian. Oriented to self only during my evaluation - states she is at the beauty salon, unaware of month or year. Re-oriented and recalled month/year at end of session. She required max assist for bed mobility, sit to stand transitions with RW x2, and pivot to recliner from bed. Able to move LLE modestly to reposition in standing, but minimal WB tolerated at this time (KI in place.) Patient will benefit from continued inpatient follow up therapy, <3 hours/day. Patient will benefit from acute skilled PT to increase their independence and safety with mobility to facilitate discharge.         If plan is discharge home, recommend the following: Two people to help with walking and/or transfers;A lot of help with bathing/dressing/bathroom;Assistance with cooking/housework;Assist for transportation;Help with stairs or ramp for entrance;Supervision due to cognitive status   Can travel by private vehicle   No    Equipment Recommendations None recommended by PT  Recommendations for Other Services       Functional Status Assessment Patient has had a recent decline in their functional status and demonstrates the ability to make significant improvements in function in a reasonable and predictable amount of time.      Precautions / Restrictions Precautions Precautions: Fall Recall of Precautions/Restrictions: Impaired Precaution/Restrictions Comments: Okay to come out of the immobilizer at rest for knee motion as tolerated Required Braces or Orthoses: Knee Immobilizer - Left Knee Immobilizer - Left: On when out of bed or walking (1-2 weeks with transfers and ambulation) Restrictions Weight Bearing Restrictions Per Provider Order: Yes LLE Weight Bearing Per Provider Order: Weight bearing as tolerated (with KI on) Other Position/Activity Restrictions: Okay to come out of the immobilizer at rest for knee motion as tolerated      Mobility  Bed Mobility Overal bed mobility: Needs Assistance Bed Mobility: Supine to Sit     Supine to sit: Max assist, HOB elevated, Used rails     General bed mobility comments: Cues for technique, rail utilized. Bed pad used to reduce friction. Helicopter technique to rise with trunk and LLE supported. Max assist.    Transfers Overall transfer level: Needs assistance Equipment used: Rolling walker (2 wheels) Transfers: Sit to/from Stand, Bed to chair/wheelchair/BSC Sit to Stand: Max assist, From elevated surface Stand pivot transfers: Max assist         General transfer comment: Max assist for boost and balance to stand. RW utilized for UE support. Able to gradually slide Lt foot back in line with Rt but poor WB tolerance, leaning posteriorly. Completed twice. on second trial pt able to pivot with max assist on Rt foot. Cues throughout for technique.    Ambulation/Gait               General Gait Details: unable to safely perform yet.  Stairs            Wheelchair  Mobility     Tilt Bed    Modified Rankin (Stroke Patients Only)       Balance Overall balance assessment: Needs assistance Sitting-balance support: Single extremity supported Sitting balance-Leahy Scale: Poor   Postural control: Right lateral lean Standing balance  support: Bilateral upper extremity supported, Reliant on assistive device for balance Standing balance-Leahy Scale: Zero                               Pertinent Vitals/Pain Pain Assessment Pain Assessment: No/denies pain    Home Living Family/patient expects to be discharged to:: Private residence Living Arrangements: Spouse/significant other;Children Available Help at Discharge: Family;Available PRN/intermittently Type of Home: House Home Access: Stairs to enter;Ramped entrance Entrance Stairs-Rails: Right;Left Entrance Stairs-Number of Steps: 5 Alternate Level Stairs-Number of Steps: 13 Home Layout: Multi-level;Able to live on main level with bedroom/bathroom;Full bath on main level Home Equipment: Rollator (4 wheels);Wheelchair - manual;Cane - single point;Shower seat;Rolling Walker (2 wheels);Grab bars - tub/shower;Grab bars - toilet;Hand held shower head;Hospital bed Additional Comments: Questionable historian, would verify with family when available.    Prior Function Prior Level of Function : History of Falls (last six months);Needs assist             Mobility Comments: Holds only family members to walk. Cannot recall if she is using AD. ADLs Comments: States she usually bathes and dresses herself but needs help at times.     Extremity/Trunk Assessment   Upper Extremity Assessment Upper Extremity Assessment: Defer to OT evaluation    Lower Extremity Assessment Lower Extremity Assessment: LLE deficits/detail;Generalized weakness;Difficult to assess due to impaired cognition LLE Deficits / Details: Pain limited. Guarded but wiggles toes, and slight ankle DF/PF LLE: Unable to fully assess due to pain;Unable to fully assess due to immobilization       Communication   Communication Communication: Impaired Factors Affecting Communication: Reduced clarity of speech    Cognition Arousal: Alert Behavior During Therapy: WFL for tasks assessed/performed    PT - Cognitive impairments: No family/caregiver present to determine baseline, Orientation, Awareness, Memory, Attention, Problem solving, Sequencing, Safety/Judgement   Orientation impairments: Time, Situation                   PT - Cognition Comments: Oriented to city, state, self only. States she is at [employer], unsure of month or year. Following commands: Impaired Following commands impaired: Follows one step commands with increased time, Follows one step commands inconsistently     Cueing Cueing Techniques: Verbal cues, Gestural cues, Tactile cues     General Comments General comments (skin integrity, edema, etc.): HR elevated to 140s upon standing first time. SpO2 99% on 3L, and 93% on RA. HR 103 prior to mobilizing. BP 130/89    Exercises General Exercises - Lower Extremity Ankle Circles/Pumps: AAROM, Both, 10 reps, Seated Quad Sets: Strengthening, Both, 5 reps, Seated   Assessment/Plan    PT Assessment Patient needs continued PT services  PT Problem List Decreased strength;Decreased range of motion;Decreased activity tolerance;Decreased balance;Decreased mobility;Decreased cognition;Decreased coordination;Decreased knowledge of use of DME;Decreased safety awareness;Decreased knowledge of precautions;Cardiopulmonary status limiting activity;Pain       PT Treatment Interventions DME instruction;Gait training;Functional mobility training;Therapeutic activities;Therapeutic exercise;Balance training;Neuromuscular re-education;Cognitive remediation;Patient/family education;Wheelchair mobility training;Modalities;Manual techniques    PT Goals (Current goals can be found in the Care Plan section)  Acute Rehab PT Goals Patient Stated Goal: none stated, confused PT Goal Formulation: Patient unable to  participate in goal setting Time For Goal Achievement: 10/29/23 Potential to Achieve Goals: Fair    Frequency Min 2X/week     Co-evaluation                AM-PAC PT 6 Clicks Mobility  Outcome Measure Help needed turning from your back to your side while in a flat bed without using bedrails?: A Lot Help needed moving from lying on your back to sitting on the side of a flat bed without using bedrails?: A Lot Help needed moving to and from a bed to a chair (including a wheelchair)?: A Lot Help needed standing up from a chair using your arms (e.g., wheelchair or bedside chair)?: A Lot Help needed to walk in hospital room?: Total Help needed climbing 3-5 steps with a railing? : Total 6 Click Score: 10    End of Session Equipment Utilized During Treatment: Gait belt;Oxygen;Left knee immobilizer Activity Tolerance: Patient limited by pain (weakness) Patient left: in chair;with call bell/phone within reach;with chair alarm set;with SCD's reapplied Nurse Communication: Mobility status;Precautions;Weight bearing status (+2 assist for pivot transfer) PT Visit Diagnosis: Unsteadiness on feet (R26.81);Other abnormalities of gait and mobility (R26.89);Muscle weakness (generalized) (M62.81);History of falling (Z91.81);Difficulty in walking, not elsewhere classified (R26.2);Pain Pain - Right/Left: Left Pain - part of body: Leg    Time: 1057-1130 PT Time Calculation (min) (ACUTE ONLY): 33 min   Charges:   PT Evaluation $PT Eval Moderate Complexity: 1 Mod PT Treatments $Therapeutic Activity: 8-22 mins PT General Charges $$ ACUTE PT VISIT: 1 Visit         Leontine Roads, PT, DPT [facility]  Rehabilitation Services Physical Therapist Office: [phone] Website: [email or web address]   Leontine GORMAN Roads 10/16/2023, 1:39 PM

## 2023-10-16 NOTE — Progress Notes (Signed)
 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  X-ray of the left hip negative for acute fracture or dislocation.  X-ray of the left knee shows acute fracture of distal left femur, orthopedics  consulted.  Patient also found to have A-fib with RVR started on Cardizem  gtt.  Cardiology consulted.  Assessment & Plan:   Principal Problem:   Fall at home, initial encounter  Acute fracture of left distal femur: Status post fall: Patient presented status post mechanical fall at home. X-ray left knee showed acute fracture of distal left femur. Orthopedics is consulted. Continue adequate pain control. Anticoagulation was held. Patient is status post ORIF POD #2 Patient has received 1 unit FFP.  INR remains 2.1. Resumed Coumadin  postoperatively. PT and OT recommended SNF. Ortho signed off.  Follow-up with Dr. Kendal in 2 weeks for wound check and repeat x-ray.  Paroxysmal A-fib with RVR: EKG showed A-fib with RVR. Patient started on Cardizem  gtt.  Heart rate now remains controlled. Continue telemetry. Continue to monitor heart rate. Cardizem  gtt. discontinued. Continue Cardizem  and metoprolol . Continue Coumadin , INR 2.1.  Acute metabolic encephalopathy likely due to polypharmacy. Patient has sudden change in the mental status. CT head rule out acute CVA. Mental status back to baseline.  CKD stage IIIb: Serum creatinine is at baseline.  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   Continue seizure precautions, fall precautions, aspiration precautions, no AED on medication  list.  Essential hypertension: Continue Cardizem  60 mg q 12hr Continue metoprolol  25 mg  q12hr Continue Aldactone  25 mg daily.  Normochromic normocytic anemia: Likely postoperative.  Hemoglobin dropped from 8.1-7.4. No obvious physical bleeding.  Continue to monitor H&H. Transfusion threshold less than 7.   DVT prophylaxis: SCDs Code Status:full code Family Communication: No family at bed side. Disposition Plan:   Status is: Inpatient Remains inpatient appropriate because: Severity of illness.    Consultants:  Cardiology Orthopedics  Procedures: Antimicrobials:  Anti-infectives (From admission, onward)    Start     Dose/Rate Route Frequency Ordered Stop   10/15/23 2200  ceFAZolin  (ANCEF ) IVPB 2g/100 mL premix        2 g 200 mL/hr over 30 Minutes Intravenous Every 12 hours 10/15/23 1336 10/16/23 0853   10/15/23 1120  vancomycin  (VANCOCIN ) powder  Status:  Discontinued          As needed 10/15/23 1120 10/15/23 1154   10/15/23 0936  ceFAZolin  (ANCEF ) 2-4 GM/100ML-% IVPB       Note to Pharmacy: LORILEE, GRETA: cabinet override      10/15/23 0936 10/15/23 2144   10/14/23 1100  ceFAZolin  (ANCEF ) IVPB 2g/100 mL premix  Status:  Discontinued        2 g 200 mL/hr over 30 Minutes Intravenous On call to O.R. 10/14/23 0533 10/14/23 1133       Subjective: Patient was seen and examined at bedside.Overnight events noted. Patient is status post ORIF. POD # 2.  Patient reports doing better. Heart rate relatively stable.  Objective: Vitals:   10/15/23 2300 10/16/23 0332 10/16/23 0700 10/16/23 0733  BP: (!) 152/75 135/87 133/88 (!) 147/92  Pulse: (!) 102  90 (!) 101 97  Resp: 19 14 12 17   Temp: 98.3 F (36.8 C) 98.1 F (36.7 C)  98.4 F (36.9 C)  TempSrc: Oral Oral  Oral  SpO2: 99% 95% 96% 97%  Weight:      Height:        Intake/Output Summary (Last 24 hours) at 10/16/2023 0955 Last data filed at 10/16/2023 0735 Gross per 24 hour  Intake 1079.62 ml  Output 850 ml  Net  229.62 ml   Filed Weights   10/12/23 1247 10/13/23 1358 10/15/23 0922  Weight: 59 kg 54.2 kg 54.2 kg    Examination:  General exam: Appears calm and comfortable, deconditioned, not in any acute distress. Respiratory system: CTA Bilaterally . Respiratory effort normal.  RR 15 Cardiovascular system: S1 & S2 heard, Irregular Rhythm. No JVD, murmurs, rubs, gallops or clicks.  Gastrointestinal system: Abdomen is non distended, soft and non tender.  Normal bowel sounds heard. Central nervous system: Alert and oriented x 2. No focal neurological deficits. Extremities: Left Hip tenderness +, s/p ORIF POD # 2 . Skin: No rashes, lesions or ulcers Psychiatry:  Mood & affect appropriate.     Data Reviewed: I have personally reviewed following labs and imaging studies  CBC: Recent Labs  Lab 10/12/23 1314 10/13/23 0439 10/15/23 0249 10/16/23 0203  WBC 9.1 9.9 11.7* 10.2  NEUTROABS 7.3  --   --   --   HGB 9.1* 8.9* 8.1* 7.4*  HCT 29.1* 28.0* 25.0* 22.5*  MCV 100.7* 97.6 96.5 95.7  PLT 211 196 188 199   Basic Metabolic Panel: Recent Labs  Lab 10/12/23 1314 10/13/23 0439 10/15/23 0249 10/16/23 0203  NA 133* 134* 130* 132*  K 4.4 4.5 4.2 4.5  CL 101 101 97* 96*  CO2 22 23 22  21*  GLUCOSE 120* 128* 112* 132*  BUN 33* 29* 34* 32*  CREATININE 1.76* 1.59* 1.67* 1.48*  CALCIUM  8.9 9.0 8.7* 8.7*  MG 1.8  --   --  1.9  PHOS  --   --   --  3.7   GFR: Estimated Creatinine Clearance: 21.6 mL/min (A) (by C-G formula based on SCr of 1.48 mg/dL (H)). Liver Function Tests: Recent Labs  Lab 10/12/23 1314 10/13/23 0439  AST 21 19  ALT 13 13  ALKPHOS 72 77  BILITOT 0.6 0.9  PROT 5.9* 6.2*  ALBUMIN 3.4* 3.3*   No results for input(s): LIPASE, AMYLASE in the last 168 hours. No results for input(s): AMMONIA in the last 168 hours. Coagulation Profile: Recent Labs  Lab 10/13/23 1749 10/14/23 0955 10/15/23 0249 10/16/23 0203  INR 2.0* 2.1* 2.0* 2.1*   Cardiac Enzymes: No  results for input(s): CKTOTAL, CKMB, CKMBINDEX, TROPONINI in the last 168 hours. BNP (last 3 results) No results for input(s): PROBNP in the last 8760 hours. HbA1C: No results for input(s): HGBA1C in the last 72 hours. CBG: No results for input(s): GLUCAP in the last 168 hours. Lipid Profile: No results for input(s): CHOL, HDL, LDLCALC, TRIG, CHOLHDL, LDLDIRECT in the last 72 hours. Thyroid  Function Tests: No results for input(s): TSH, T4TOTAL, FREET4, T3FREE, THYROIDAB in the last 72 hours. Anemia Panel: No results for input(s): VITAMINB12, FOLATE, FERRITIN, TIBC, IRON, RETICCTPCT in the last 72 hours.  Sepsis Labs: No results for input(s): PROCALCITON, LATICACIDVEN in the last 168 hours.  Recent Results (from the past 240 hours)  Surgical pcr screen     Status: None   Collection Time: 10/13/23  7:07 AM   Specimen:  Nasal Mucosa; Nasal Swab  Result Value Ref Range Status   MRSA, PCR NEGATIVE NEGATIVE Final   Staphylococcus aureus NEGATIVE NEGATIVE Final    Comment: (NOTE) The Xpert SA Assay (FDA approved for NASAL specimens in patients 58 years of age and older), is one component of a comprehensive surveillance program. It is not intended to diagnose infection nor to guide or monitor treatment. Performed at [facility], [address] [place], [place] [address]     Radiology Studies: DG FEMUR PORT MIN 2 VIEWS LEFT Result Date: 10/15/2023 CLINICAL DATA:  Post internal fixation of periprosthetic fracture to the distal femur. EXAM: LEFT FEMUR PORTABLE 2 VIEWS COMPARISON:  Left knee 10/12/2023 FINDINGS: Postoperative changes with long intramedullary rod, femoral neck compression screws, and lateral plate and screw fixation of the femur. A left total knee arthroplasty is present. Distal left metaphyseal fracture is again demonstrated. Near anatomic position of the fracture fragments is suggested. IMPRESSION: Interval placement  of long lateral plate and screw fixation of the femoral shaft to the knee. Distal metaphyseal fracture fragments appear in near anatomic position. Old intramedullary rod, femoral neck compression bold, and left knee arthroplasty hardware remains unchanged. The Electronically Signed   By: Elsie Gravely M.D.   On: 10/15/2023 16:46   DG FEMUR MIN 2 VIEWS LEFT Result Date: 10/15/2023 CLINICAL DATA:  Open reduction internal fixation of periprosthetic distal femur fracture EXAM: LEFT FEMUR 2 VIEWS COMPARISON:  Left knee radiograph dated 10/12/2023 FINDINGS: Seven fluoroscopic images obtained during open reduction internal fixation of periprosthetic fracture. 69.2 seconds fluoro time utilized. Radiation dose 3.9 mGy Kerma. Please see performing physicians operative report for full details. IMPRESSION: Fluoroscopic images were obtained for intraoperative guidance of open reduction internal fixation of periprosthetic distal femur fracture. Electronically Signed   By: Limin  Xu M.D.   On: 10/15/2023 15:38   DG C-Arm 1-60 Min-No Report Result Date: 10/15/2023 Fluoroscopy was utilized by the requesting physician.  No radiographic interpretation.   Scheduled Meds:  sodium chloride    Intravenous Once   anastrozole   1 mg Oral Daily   diltiazem   60 mg Oral Q12H   docusate sodium   100 mg Oral BID   feeding supplement  237 mL Oral BID BM   loratadine   10 mg Oral Daily   metoprolol  succinate  25 mg Oral BID   multivitamin with minerals  1 tablet Oral Daily   pantoprazole   40 mg Oral Daily   rosuvastatin   10 mg Oral Once per day on Sunday Wednesday   sertraline   75 mg Oral QHS   warfarin  2 mg Oral ONCE-1600   Warfarin - Pharmacist Dosing Inpatient   Does not apply q1600   Continuous Infusions:     LOS: 4 days    Time spent: 35 mins    Darcel Dawley, MD [facility]   If 7PM-7AM, please contact night-coverage

## 2023-10-16 NOTE — Progress Notes (Signed)
 Orthopaedic Trauma Progress Note  SUBJECTIVE: Doing okay this morning, currently eating breakfast.  Pain controlled on current regimen.  No chest pain. No SOB. No nausea/vomiting. No other complaints.  Tolerating diet and fluids.  Has not been out of bed yet since surgery.  Denies any significant numbness or tingling throughout left lower extremity.  OBJECTIVE:  Vitals:   10/16/23 0700 10/16/23 0733  BP: 133/88 (!) 147/92  Pulse: (!) 101 97  Resp: 12 17  Temp:  98.4 F (36.9 C)  SpO2: 96% 97%    Opiates Today (MME): Today's  total administered Morphine  Milligram Equivalents: 5 Opiates Yesterday (MME): Yesterday's total administered Morphine  Milligram Equivalents: 73.5  General: Sitting up in bed eating breakfast, no acute distress Respiratory: No increased work of breathing.  Operative Extremity (left lower extremity): Knee immobilizer in place.  Dressing clean, dry, intact.  Some tenderness through the distal thigh as expected.  Nontender through the calf or over the lateral hip.  Ankle DF/PF intact.  Dorsal sensation light touch over the dorsal plantar aspect of the foot.  Toes warm and well-perfused.  IMAGING: Stable post op imaging left femur  LABS:  Results for orders placed or performed during the hospital encounter of 10/12/23 (from the past 24 hours)  Prepare RBC (crossmatch)     Status: None   Collection Time: 10/15/23  9:28 AM  Result Value Ref Range   Order Confirmation      ORDER PROCESSED BY BLOOD BANK Performed at [facility], [address], [place], [place] [address]   Basic metabolic panel     Status: Abnormal   Collection Time: 10/16/23  2:03 AM  Result Value Ref Range   Sodium 132 (L) 135 - 145 mmol/L   Potassium 4.5 3.5 - 5.1 mmol/L   Chloride 96 (L) 98 - 111 mmol/L   CO2 21 (L) 22 - 32 mmol/L   Glucose, Bld 132 (H) 70 - 99 mg/dL   BUN 32 (H) 8 - 23 mg/dL   Creatinine, Ser 8.51 (H) 0.44 - 1.00 mg/dL   Calcium  8.7 (L) 8.9 - 10.3 mg/dL   GFR,  Estimated 35 (L) >60 mL/min   Anion gap 15 5 - 15  CBC     Status: Abnormal   Collection Time: 10/16/23  2:03 AM  Result Value Ref Range   WBC 10.2 4.0 - 10.5 K/uL   RBC 2.35 (L) 3.87 - 5.11 MIL/uL   Hemoglobin 7.4 (L) 12.0 - 15.0 g/dL   HCT 77.4 (L) 63.9 - 53.9 %   MCV 95.7 80.0 - 100.0 fL   MCH 31.5 26.0 - 34.0 pg   MCHC 32.9 30.0 - 36.0 g/dL   RDW 87.2 88.4 - 84.4 %   Platelets 199 150 - 400 K/uL   nRBC 0.0 0.0 - 0.2 %  Protime-INR     Status: Abnormal   Collection Time: 10/16/23  2:03 AM  Result Value Ref Range   Prothrombin  Time 24.6 (H) 11.4 - 15.2 seconds   INR 2.1 (H) 0.8 - 1.2  Magnesium      Status: None   Collection Time: 10/16/23  2:03 AM  Result Value Ref Range   Magnesium  1.9 1.7 - 2.4 mg/dL  Phosphorus     Status: None   Collection Time: 10/16/23  2:03 AM  Result Value Ref Range   Phosphorus 3.7 2.5 - 4.6 mg/dL    ASSESSMENT: Brandi [name] is a 83 y.o. female, 1 Day Post-Op s/p fall Procedures: OPEN  REDUCTION INTERNAL FIXATION LEFT PERIPROSTHETIC FRACTURE  CV/Blood loss: Acute blood loss anemia, Hgb 7.4 this AM. Hemodynamically stable  PLAN: Weightbearing: WBAT LLE in knee immobilizer ROM: Okay to come out of the immobilizer at rest for knee motion as tolerated Incisional and dressing care: Reinforce dressings as needed  Showering: Okay to be getting incisions wet starting 10/18/2023 Orthopedic device(s): Knee immobilizer with ambulation and transferring Pain management:  1. Tylenol  325-650 mg q 6 hours PRN 2. Robaxin  500 mg q 6 hours PRN 3. Norco 5-325 mg mg q 6 hours PRN 4. Morphine  0.5-1 mg q 2 hours PRN VTE prophylaxis: Coumadin , SCDs ID:  Ancef  2gm post op Foley/Lines:  No foley, KVO IVFs Impediments to Fracture Healing: Fragility fracture.  Check vitamin D level. Dispo: PT/OT evaluation today, dispo pending.  Anticipate patient will need SNF at discharge.  Ortho issues stable.  Continue to monitor CBC.  Okay for discharge from ortho standpoint  once cleared by medicine team and therapies  D/C recommendations: - Norco, Robaxin , Tylenol  for pain control - Home dose Coumadin  for DVT prophylaxis - Possible need for Vit D supplementation  Follow - up plan: 2 weeks after d/c for wound check and repeat x-rays   Contact information:  Franky Light MD, Lauraine Moores PA-C. After hours and holidays please check [email or web address] for group call information for [facility]   Lauraine PATRIC Moores, PA-C [phone] (office) [email or web address]

## 2023-10-16 NOTE — Progress Notes (Signed)
 PHARMACY - ANTICOAGULATION CONSULT NOTE  Pharmacy Consult for warfarin Indication: atrial fibrillation  Allergies  Allergen Reactions   Amoxil [Amoxicillin] Diarrhea   Celebrex [Celecoxib] Other (See Comments)    Bruising and stomach pain   Keppra  [Levetiracetam ] Other (See Comments)    Somnolence    Patient Measurements: Height: 4' 11 (149.9 cm) Weight: 54.2 kg (119 lb 7.8 oz) IBW/kg (Calculated) : 43.2 HEPARIN DW (KG): 54.1  Vital Signs: Temp: 98.1 F (36.7 C) (09/27 0332) Temp Source: Oral (09/27 0332) BP: 135/87 (09/27 0332) Pulse Rate: 90 (09/27 0332)  Labs: Recent Labs    10/14/23 0955 10/15/23 0249 10/16/23 0203  HGB  --  8.1* 7.4*  HCT  --  25.0* 22.5*  PLT  --  188 199  LABPROT 25.0* 23.8* 24.6*  INR 2.1* 2.0* 2.1*  CREATININE  --  1.67* 1.48*    Estimated Creatinine Clearance: 21.6 mL/min (A) (by C-G formula based on SCr of 1.48 mg/dL (H)).   Medical History: Past Medical History:  Diagnosis Date   Allergy    Atrial fibrillation (HCC)    Dysrhythmia    Hyperlipidemia    Hypertension    Neuromuscular disorder (HCC)    Venous insufficiency       Assessment: 4 yoF admitted with fall and AF. Pt on warfarin PTA which was held for ORIF 9/26. Pharmacy to resume warfarin 9/27 with no bridge. Confirmed with husband 9/27 PTA dose 2mg  TTSat, 1mg  MWFSun.   INR today is 2.1.  Goal of Therapy:  INR 2-3 Monitor platelets by anticoagulation protocol: Yes   Plan:  Warfarin 2mg  x1 tonight Daily INR  Ozell Jamaica, PharmD, BCPS, [facility] Clinical Pharmacist [phone] Please check AMION for all [facility] Pharmacy numbers 10/16/2023

## 2023-10-16 NOTE — TOC Progression Note (Signed)
 Transition of Care [facility]) - Progression Note    Patient Details  Name: Brandi [name] MRN: [id number] Date of Birth: January 11, 1941  [facility] CM/SW Contact  Isaiah Public, LCSWA Phone Number: 10/16/2023, 2:45 PM  Clinical Narrative:      CSW received consult for possible SNF placement at time of discharge. Due to patients current orientation CSW LVM for patients daughter [name]. CSW awaiting call back to discuss  PT recommendation of SNF placement at time of discharge for patient.  CSW to continue to follow and assist with discharge planning needs.    Expected Discharge Plan: Home w Home Health Services Barriers to Discharge: Continued Medical Work up               Expected Discharge Plan and Services   Discharge Planning Services: CM Consult   Living arrangements for the past 2 months: Single Family Home                                       Social Drivers of Health (SDOH) Interventions SDOH Screenings   Food Insecurity: Patient Unable To Answer (10/13/2023)  Housing: Patient Unable To Answer (10/13/2023)  Transportation Needs: Unknown (10/13/2023)  Utilities: Patient Unable To Answer (10/13/2023)  Depression (PHQ2-9): Low Risk  (09/14/2023)  Social Connections: Patient Unable To Answer (10/13/2023)  Tobacco Use: Low Risk  (10/15/2023)    Readmission Risk Interventions    07/30/2023   11:14 AM 11/12/2022    3:06 PM  Readmission Risk Prevention Plan  Transportation Screening Complete Complete  PCP or Specialist Appt within 3-5 Days  Complete  HRI or Home Care Consult Complete Complete  Social Work Consult for Recovery Care Planning/Counseling Complete   Palliative Care Screening Not Applicable Complete  Medication Review [employer]) Complete Complete

## 2023-10-17 DIAGNOSIS — I10 Essential (primary) hypertension: Secondary | ICD-10-CM | POA: Diagnosis not present

## 2023-10-17 DIAGNOSIS — Y92009 Unspecified place in unspecified non-institutional (private) residence as the place of occurrence of the external cause: Secondary | ICD-10-CM | POA: Diagnosis not present

## 2023-10-17 DIAGNOSIS — W19XXXA Unspecified fall, initial encounter: Secondary | ICD-10-CM | POA: Diagnosis not present

## 2023-10-17 LAB — TYPE AND SCREEN
ABO/RH(D): A POS
Antibody Screen: NEGATIVE
Unit division: 0

## 2023-10-17 LAB — BASIC METABOLIC PANEL WITH GFR
Anion gap: 10 (ref 5–15)
BUN: 43 mg/dL — ABNORMAL HIGH (ref 8–23)
CO2: 24 mmol/L (ref 22–32)
Calcium: 8.7 mg/dL — ABNORMAL LOW (ref 8.9–10.3)
Chloride: 98 mmol/L (ref 98–111)
Creatinine, Ser: 1.72 mg/dL — ABNORMAL HIGH (ref 0.44–1.00)
GFR, Estimated: 29 mL/min — ABNORMAL LOW (ref >60)
Glucose, Bld: 98 mg/dL (ref 70–99)
Potassium: 4.2 mmol/L (ref 3.5–5.1)
Sodium: 132 mmol/L — ABNORMAL LOW (ref 135–145)

## 2023-10-17 LAB — BPAM RBC
Blood Product Expiration Date: 202510172359
Unit Type and Rh: 6200

## 2023-10-17 LAB — PROTIME-INR
INR: 2.5 — ABNORMAL HIGH (ref 0.8–1.2)
Prothrombin Time: 28.4 s — ABNORMAL HIGH (ref 11.4–15.2)

## 2023-10-17 LAB — CBC
HCT: 22 % — ABNORMAL LOW (ref 36.0–46.0)
Hemoglobin: 7 g/dL — ABNORMAL LOW (ref 12.0–15.0)
MCH: 31.3 pg (ref 26.0–34.0)
MCHC: 31.8 g/dL (ref 30.0–36.0)
MCV: 98.2 fL (ref 80.0–100.0)
Platelets: 280 K/uL (ref 150–400)
RBC: 2.24 MIL/uL — ABNORMAL LOW (ref 3.87–5.11)
RDW: 12.9 % (ref 11.5–15.5)
WBC: 11.5 K/uL — ABNORMAL HIGH (ref 4.0–10.5)
nRBC: 0.3 % — ABNORMAL HIGH (ref 0.0–0.2)

## 2023-10-17 LAB — PREPARE RBC (CROSSMATCH)

## 2023-10-17 LAB — HEMOGLOBIN AND HEMATOCRIT, BLOOD
HCT: 31.1 % — ABNORMAL LOW (ref 36.0–46.0)
Hemoglobin: 10 g/dL — ABNORMAL LOW (ref 12.0–15.0)

## 2023-10-17 MED ORDER — WARFARIN SODIUM 1 MG PO TABS
1.0000 mg | ORAL_TABLET | Freq: Once | ORAL | Status: AC
  Administered 2023-10-17: 1 mg via ORAL
  Filled 2023-10-17: qty 1

## 2023-10-17 MED ORDER — SODIUM CHLORIDE 0.9% IV SOLUTION: Freq: Once | INTRAVENOUS | Status: DC

## 2023-10-17 NOTE — Progress Notes (Addendum)
 Orthopaedic Trauma Progress Note  SUBJECTIVE: Doing okay this morning, currently receiving 1 unit PRBCs for hemoglobin of 7.1.  Pain controlled on current regimen.  No chest pain. No SOB. No nausea/vomiting. No other complaints.  Tolerating diet and fluids. Denies any significant numbness or tingling throughout left lower extremity.  Was able to work with therapies yesterday, he tolerated minimal weightbearing.  Patient's daughter just arrived at bedside.  OBJECTIVE:  Vitals:   10/17/23 0959 10/17/23 1047  BP: (!) 156/91 (!) 162/97  Pulse: (!) 102 (!) 115  Resp: 18 18  Temp: 98.7 F (37.1 C) 98.7 F (37.1 C)  SpO2: 94% 100%    Opiates Today (MME): Today's  total administered Morphine  Milligram Equivalents: 0 Opiates Yesterday (MME): Yesterday's total administered Morphine  Milligram Equivalents: 5  General: Sitting up in bed eating breakfast, no acute distress Respiratory: No increased work of breathing.  Operative Extremity (left lower extremity): Knee immobilizer not currently in place.  Ace wrap removed, Mepilex dressings clean, dry, intact.  Some tenderness through the distal thigh as expected.  Nontender through the calf or over the lateral hip.  Ankle DF/PF intact.  Endorses sensation to light touch over the dorsal/plantar aspect of the foot.  Toes warm and well-perfused. + DP pulse  IMAGING: Stable post op imaging left femur  LABS:  Results for orders placed or performed during the hospital encounter of 10/12/23 (from the past 24 hours)  CBC     Status: Abnormal   Collection Time: 10/17/23  4:37 AM  Result Value Ref Range   WBC 11.5 (H) 4.0 - 10.5 K/uL   RBC 2.24 (L) 3.87 - 5.11 MIL/uL   Hemoglobin 7.0 (L) 12.0 - 15.0 g/dL   HCT 77.9 (L) 63.9 - 53.9 %   MCV 98.2 80.0 - 100.0 fL   MCH 31.3 26.0 - 34.0 pg   MCHC 31.8 30.0 - 36.0 g/dL   RDW 87.0 88.4 - 84.4 %   Platelets 280 150 - 400 K/uL   nRBC 0.3 (H) 0.0 - 0.2 %  Protime-INR     Status: Abnormal   Collection Time:  10/17/23  4:37 AM  Result Value Ref Range   Prothrombin  Time 28.4 (H) 11.4 - 15.2 seconds   INR 2.5 (H) 0.8 - 1.2  Basic metabolic panel     Status: Abnormal   Collection Time: 10/17/23  4:37 AM  Result Value Ref Range   Sodium 132 (L) 135 - 145 mmol/L   Potassium 4.2 3.5 - 5.1 mmol/L   Chloride 98 98 - 111 mmol/L   CO2 24 22 - 32 mmol/L   Glucose, Bld 98 70 - 99 mg/dL   BUN 43 (H) 8 - 23 mg/dL   Creatinine, Ser 8.27 (H) 0.44 - 1.00 mg/dL   Calcium  8.7 (L) 8.9 - 10.3 mg/dL   GFR, Estimated 29 (L) >60 mL/min   Anion gap 10 5 - 15  Type and screen [facility]     Status: None (Preliminary result)   Collection Time: 10/17/23  7:41 AM  Result Value Ref Range   ABO/RH(D) A POS    Antibody Screen NEG    Sample Expiration 10/20/2023,2359    Unit Number [id number]    Blood Component Type RBC LR PHER1    Unit division 00    Status of Unit ISSUED    Transfusion Status OK TO TRANSFUSE    Crossmatch Result      Compatible Performed at [facility], [address], [place], [place] [address]   Prepare RBC (crossmatch)     Status: None   Collection Time: 10/17/23  7:41 AM  Result Value Ref Range   Order Confirmation      ORDER PROCESSED BY BLOOD BANK Performed at [facility], [address], [place], [place] [address]     ASSESSMENT: Brandi [name] is a 83 y.o. female, 2 Days Post-Op s/p fall Procedures: OPEN REDUCTION INTERNAL FIXATION LEFT PERIPROSTHETIC FRACTURE  CV/Blood loss: Acute blood loss anemia, Hgb 7.0 this AM.  Receiving 1 unit PRBCs.  PLAN: Weightbearing: WBAT LLE in knee immobilizer ROM: Okay to come out of the immobilizer at rest for knee motion as tolerated Incisional and dressing care: Change Mepilex dressings as needed Showering: Okay to be getting incisions wet starting 10/18/2023 Orthopedic device(s): Knee immobilizer with ambulation and transferring Pain management:  1. Tylenol  325-650 mg q 6 hours PRN 2. Robaxin  500 mg q  6 hours PRN 3. Norco 5-325 mg mg q 6 hours PRN 4. Morphine  0.5-1 mg q 2 hours PRN VTE prophylaxis: Coumadin , SCDs ID:  Ancef  2gm post op completed Foley/Lines:  No foley, KVO IVFs Impediments to Fracture Healing: Fragility fracture.  Vitamin D level 52, no additional supplementation needed  Dispo: PT/OT evaluation ongoing, recommending SNF.  TOC following for placement. Ortho issues stable.  Continue to monitor CBC.  Okay for discharge from ortho standpoint once cleared by medicine team and therapies  D/C recommendations: - Norco, Robaxin , Tylenol  for pain control - Home dose Coumadin  for DVT prophylaxis - No additional need for Vit D supplementation  Follow - up plan: 2 weeks after d/c for wound check and repeat x-rays   Contact information:  Franky Light MD, Lauraine Moores PA-C. After hours and holidays please check [email or web address] for group call information for [facility]   Lauraine PATRIC Moores, PA-C [phone] (office) [email or web address]

## 2023-10-17 NOTE — Evaluation (Signed)
 Occupational Therapy Evaluation Patient Details Name: Brandi [name] MRN: [id number] DOB: September 28, 1940 Today's Date: 10/17/2023   History of Present Illness   83 years old female presented to the ED 9/23 with complaints of left hip pain after fall at home. X-ray of the left knee shows acute fracture of distal left femur. S/p OPEN REDUCTION INTERNAL FIXATION LEFT PERIPROSTHETIC FRACTURE 9/26. With PMH significant for history of A-fib, hyperlipidemia, essential hypertension, obesity, history of subdural hematoma, CKD stage IIIb , seizures, falls.     Clinical Impressions Pt currently max +2 to total assist for simulated selfcare sit to stand and transfers bed to recliner this session.  Prior to admission she was living with her son and was needing occasional assist with home management but she was at a supervision level with most bathing dressing tasks and mobility with use of an assistive device or min assist when holding onto a family member's arm when walking without.  Feel she will benefit from acute care OT at this time in order to progress to a safer level with basic selfcare and help decrease caregiver independence.  Based on current level of function, recommend  that she will benefit from continued inpatient follow up therapy, <3 hours/day post acute stay in order to progress to a safe level for discharge home with her son.          If plan is discharge home, recommend the following:   Two people to help with walking and/or transfers;Two people to help with bathing/dressing/bathroom;Assist for transportation;Help with stairs or ramp for entrance;Supervision due to cognitive status     Functional Status Assessment   Patient has had a recent decline in their functional status and demonstrates the ability to make significant improvements in function in a reasonable and predictable amount of time.     Equipment Recommendations   Other (comment) (TBD next venue of care)       Precautions/Restrictions   Precautions Precautions: Fall Recall of Precautions/Restrictions: Impaired Precaution/Restrictions Comments: Okay to come out of the immobilizer at rest for knee motion as tolerated Required Braces or Orthoses: Knee Immobilizer - Left Knee Immobilizer - Left: On when out of bed or walking Restrictions Weight Bearing Restrictions Per Provider Order: No RLE Weight Bearing Per Provider Order: Weight bearing as tolerated LLE Weight Bearing Per Provider Order: Weight bearing as tolerated     Mobility Bed Mobility Overal bed mobility: Needs Assistance Bed Mobility: Supine to Sit     Supine to sit: Max assist     General bed mobility comments: Max assist for all aspects of supine to sit.    Transfers Overall transfer level: Needs assistance Equipment used: Rolling walker (2 wheels) Transfers: Sit to/from Stand, Bed to chair/wheelchair/BSC Sit to Stand: Max assist, From elevated surface Stand pivot transfers: Total assist         General transfer comment: Max assist to stand from EOB with RW but unable to maintain for more than a few seconds with flexed posture.  Stand pivot to the chair without for transfer.      Balance Overall balance assessment: Needs assistance Sitting-balance support: Single extremity supported, Feet supported Sitting balance-Leahy Scale: Poor Sitting balance - Comments: right lean in sitting requiring min assist at times to correct   Standing balance support: Bilateral upper extremity supported, Reliant on assistive device for balance Standing balance-Leahy Scale: Zero  ADL either performed or assessed with clinical judgement   ADL Overall ADL's : Needs assistance/impaired Eating/Feeding: Independent   Grooming: Wash/dry hands;Wash/dry face;Supervision/safety   Upper Body Bathing: Supervision/ safety;Sitting   Lower Body Bathing: Maximal assistance;+2 for physical  assistance;Sit to/from stand   Upper Body Dressing : Minimal assistance;Sitting   Lower Body Dressing: Maximal assistance;Sit to/from stand;+2 for physical assistance   Toilet Transfer: Total assistance;Stand-pivot;BSC/3in1   Toileting- Clothing Manipulation and Hygiene: Maximal assistance;+2 for safety/equipment       Functional mobility during ADLs: Total assistance (stand pivot to the recliner) General ADL Comments: Pt able to complete one standing trial with max assist using the RW.  Unable to stand longer than 30 seconds or take steps.  Increased LOB posteriorly and to the right.  Noted right lean in sitting prior to standing.     Vision Baseline Vision/History: 0 No visual deficits Ability to See in Adequate Light: 0 Adequate Patient Visual Report: No change from baseline Vision Assessment?: No apparent visual deficits     Perception Perception: Within Functional Limits       Praxis Praxis: WFL       Pertinent Vitals/Pain Pain Assessment Pain Assessment: No/denies pain     Extremity/Trunk Assessment Upper Extremity Assessment Upper Extremity Assessment: Generalized weakness (Not formally assessed but noted during functional use)       Cervical / Trunk Assessment Cervical / Trunk Assessment: Kyphotic   Communication Communication Communication: Impaired Factors Affecting Communication: Reduced clarity of speech   Cognition Arousal: Alert Behavior During Therapy: WFL for tasks assessed/performed Cognition: No family/caregiver present to determine baseline, History of cognitive impairments             OT - Cognition Comments: Pt not able to state reason for hospitalization.  When given choice and asking if she's at a hospital or church, she chose the latter.                 Following commands: Impaired Following commands impaired: Follows one step commands with increased time     Cueing  General Comments   Cueing Techniques: Verbal  cues;Tactile cues              Home Living Family/patient expects to be discharged to:: Private residence Living Arrangements: Spouse/significant other;Children Available Help at Discharge: Family;Available PRN/intermittently Type of Home: House Home Access: Stairs to enter;Ramped entrance Entrance Stairs-Number of Steps: 5 Entrance Stairs-Rails: Right;Left Home Layout: Multi-level;Able to live on main level with bedroom/bathroom;Full bath on main level Alternate Level Stairs-Number of Steps: 13 Alternate Level Stairs-Rails: Can reach both Bathroom Shower/Tub: Producer, television/film/video: Handicapped height Bathroom Accessibility: Yes   Home Equipment: Rollator (4 wheels);Wheelchair - manual;Cane - single point;Shower seat;Rolling Walker (2 wheels);Grab bars - tub/shower;Grab bars - toilet;Hand held shower head;Hospital bed   Additional Comments: Questionable historian, would verify with family when available.      Prior Functioning/Environment Prior Level of Function : History of Falls (last six months);Needs assist             Mobility Comments: Holds only family members to walk. Cannot recall if she is using AD. ADLs Comments: States she usually bathes and dresses herself but needs help at times.    OT Problem List: Decreased strength;Decreased activity tolerance;Impaired balance (sitting and/or standing);Decreased safety awareness;Pain;Decreased cognition;Decreased knowledge of use of DME or AE   OT Treatment/Interventions: Self-care/ADL training;Patient/family education;Balance training;Therapeutic activities;DME and/or AE instruction      OT Goals(Current goals can be found  in the care plan section)   Acute Rehab OT Goals Patient Stated Goal: Pt did not state this session. OT Goal Formulation: With patient Time For Goal Achievement: 10/31/23 Potential to Achieve Goals: Fair   OT Frequency:  Min 1X/week       AM-PAC OT 6 Clicks Daily Activity      Outcome Measure Help from another person eating meals?: A Little Help from another person taking care of personal grooming?: A Little Help from another person toileting, which includes using toliet, bedpan, or urinal?: Total Help from another person bathing (including washing, rinsing, drying)?: Total Help from another person to put on and taking off regular upper body clothing?: A Little Help from another person to put on and taking off regular lower body clothing?: Total 6 Click Score: 12   End of Session Equipment Utilized During Treatment: Gait belt;Rolling walker (2 wheels) Nurse Communication: Need for lift equipment  Activity Tolerance: Patient limited by pain Patient left: in chair;with call bell/phone within reach;with chair alarm set  OT Visit Diagnosis: Unsteadiness on feet (R26.81);Other abnormalities of gait and mobility (R26.89);Repeated falls (R29.6);Muscle weakness (generalized) (M62.81);Other symptoms and signs involving cognitive function;Pain Pain - Right/Left: Left Pain - part of body: Hip                Time: 8574-8496 OT Time Calculation (min): 38 min Charges:  OT General Charges $OT Visit: 1 Visit OT Evaluation $OT Eval Moderate Complexity: 1 Mod OT Treatments $Self Care/Home Management : 23-37 mins  Lynwood Constant, OTR/L Acute Rehabilitation Services  Office [phone] 10/17/2023

## 2023-10-17 NOTE — Progress Notes (Signed)
 PHARMACY - ANTICOAGULATION CONSULT NOTE  Pharmacy Consult for warfarin Indication: atrial fibrillation  Allergies  Allergen Reactions   Amoxil [Amoxicillin] Diarrhea   Celebrex [Celecoxib] Other (See Comments)    Bruising and stomach pain   Keppra  [Levetiracetam ] Other (See Comments)    Somnolence    Patient Measurements: Height: 4' 11 (149.9 cm) Weight: 54.2 kg (119 lb 7.8 oz) IBW/kg (Calculated) : 43.2 HEPARIN DW (KG): 54.1  Vital Signs: Temp: 97.8 F (36.6 C) (09/28 0810) Temp Source: Oral (09/28 0810) BP: 138/97 (09/28 0810) Pulse Rate: 112 (09/28 0405)  Labs: Recent Labs    10/15/23 0249 10/16/23 0203 10/17/23 0437  HGB 8.1* 7.4* 7.0*  HCT 25.0* 22.5* 22.0*  PLT 188 199 280  LABPROT 23.8* 24.6* 28.4*  INR 2.0* 2.1* 2.5*  CREATININE 1.67* 1.48* 1.72*    Estimated Creatinine Clearance: 18.6 mL/min (A) (by C-G formula based on SCr of 1.72 mg/dL (H)).   Medical History: Past Medical History:  Diagnosis Date   Allergy    Atrial fibrillation (HCC)    Dysrhythmia    Hyperlipidemia    Hypertension    Neuromuscular disorder (HCC)    Venous insufficiency       Assessment: 32 yoF admitted with fall and AF. Pt on warfarin PTA which was held for ORIF 9/26. Pharmacy to resume warfarin 9/27 with no bridge.  Per husband (10/16/23), PTA dose 2mg  TTSat, 1mg  MWFSun (which also aligns with 10/06/23 anti-coag visit note).   Today's INR remains therapeutic at 2.5 Hgb 7, PLT wnl.   Goal of Therapy:  INR 2-3 Monitor platelets by anticoagulation protocol: Yes   Plan:  Warfarin 1mg  x1 tonight Daily INR  Feliciano Close, PharmD PGY2 Infectious Diseases Pharmacy Resident  10/17/2023 9:22 AM

## 2023-10-17 NOTE — Progress Notes (Signed)
 PROGRESS NOTE    Brandi [name]  FMW:[id number] DOB: 05-16-1940 DOA: 10/12/2023 PCP: Addie Camellia CROME, MD   Brief Narrative:  This 83 years old female with PMH significant for history of A-fib, hyperlipidemia, essential hypertension, obesity, history of subdural hematoma, CKD stage IIIb with baseline creatinine 1.3-1.6, history of seizures, history of falls presented to the ED with complaints of left hip pain after fall at home yesterday.  X-ray of the left hip negative for acute fracture or dislocation.  X-ray of the left knee shows acute fracture of distal left femur, orthopedics  consulted.  Patient also found to have A-fib with RVR started on Cardizem  gtt.  Cardiology consulted.  Assessment & Plan:   Principal Problem:   Fall at home, initial encounter  Acute fracture of left distal femur: Status post fall: Patient presented status post mechanical fall at home. X-ray left knee showed acute fracture of distal left femur. Orthopedics is consulted. Continue adequate pain control. Anticoagulation was held and now resumed postoperatively. Patient is status post ORIF POD #3 Patient has received 1 unit FFP.  INR remains 2.1. Resumed Coumadin  postoperatively. PT and OT recommended SNF. Ortho signed off.  Follow-up with Dr. Kendal in 2 weeks for wound check and repeat x-ray.  Paroxysmal A-fib with RVR: EKG showed A-fib with RVR. Patient started on Cardizem  gtt.  Heart rate now remains controlled. Continue telemetry. Continue to monitor heart rate. Cardizem  gtt. discontinued. Continue Cardizem  and metoprolol . Continue Coumadin , INR 2.1.  Acute metabolic encephalopathy likely due to polypharmacy. Patient has sudden change in the mental status. CT head rule out acute CVA. Mental status back to baseline.  CKD stage IIIb: Serum creatinine is at baseline.  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   Continue seizure precautions, fall precautions, aspiration  precautions, no AED on medication list.  Essential hypertension: Continue Cardizem  60 mg q 12hr Continue metoprolol  25 mg  q12hr Continue Aldactone  25 mg daily.  Normochromic normocytic anemia: Likely postoperative.  Hemoglobin dropped from 8.1->7.4> 7.0 No obvious physical bleeding.  Continue to monitor H&H. Transfuse 1 unit PRBC follow-up post transfusion H&H   DVT prophylaxis: SCDs Code Status:full code Family Communication: No family at bed side. Disposition Plan:   Status is: Inpatient Remains inpatient appropriate because: Severity of illness.    Consultants:  Cardiology Orthopedics  Procedures: Antimicrobials:  Anti-infectives (From admission, onward)    Start     Dose/Rate Route Frequency Ordered Stop   10/15/23 2200  ceFAZolin  (ANCEF ) IVPB 2g/100 mL premix        2 g 200 mL/hr over 30 Minutes Intravenous Every 12 hours 10/15/23 1336 10/16/23 0853   10/15/23 1120  vancomycin  (VANCOCIN ) powder  Status:  Discontinued          As needed 10/15/23 1120 10/15/23 1154   10/15/23 0936  ceFAZolin  (ANCEF ) 2-4 GM/100ML-% IVPB       Note to Pharmacy: LORILEE POCK: cabinet override      10/15/23 0936 10/15/23 2144   10/14/23 1100  ceFAZolin  (ANCEF ) IVPB 2g/100 mL premix  Status:  Discontinued        2 g 200 mL/hr over 30 Minutes Intravenous On call to O.R. 10/14/23 0533 10/14/23 1133       Subjective: Patient was seen and examined at bedside.Overnight events noted. Patient is status post ORIF. POD # 3.  Patient reports doing better. Heart rate relatively stable.  Hemoglobin dropped to 7.0.  Getting 1 unit PRBC.  Objective: Vitals:   10/17/23 0405  10/17/23 0810 10/17/23 0959 10/17/23 1047  BP: (!) 147/86 (!) 138/97 (!) 156/91 (!) 162/97  Pulse: (!) 112  (!) 102 (!) 115  Resp: 14 17 18 18   Temp: 98.2 F (36.8 C) 97.8 F (36.6 C) 98.7 F (37.1 C) 98.7 F (37.1 C)  TempSrc: Oral Oral Oral Oral  SpO2: 96% 96% 94% 100%  Weight:      Height:         Intake/Output Summary (Last 24 hours) at 10/17/2023 1121 Last data filed at 10/17/2023 0900 Gross per 24 hour  Intake 124.9 ml  Output 700 ml  Net -575.1 ml   Filed Weights   10/12/23 1247 10/13/23 1358 10/15/23 0922  Weight: 59 kg 54.2 kg 54.2 kg    Examination:  General exam: Appears calm and comfortable, deconditioned, not in any acute distress. Respiratory system: CTA Bilaterally . Respiratory effort normal.  RR 15 Cardiovascular system: S1 & S2 heard, Irregular Rhythm. No JVD, murmurs, rubs, gallops or clicks.  Gastrointestinal system: Abdomen is non distended, soft and non tender.  Normal bowel sounds heard. Central nervous system: Alert and oriented x 2. No focal neurological deficits. Extremities: Left Hip tenderness +, s/p ORIF POD # 3 . Skin: No rashes, lesions or ulcers Psychiatry:  Mood & affect appropriate.     Data Reviewed: I have personally reviewed following labs and imaging studies  CBC: Recent Labs  Lab 10/12/23 1314 10/13/23 0439 10/15/23 0249 10/16/23 0203 10/17/23 0437  WBC 9.1 9.9 11.7* 10.2 11.5*  NEUTROABS 7.3  --   --   --   --   HGB 9.1* 8.9* 8.1* 7.4* 7.0*  HCT 29.1* 28.0* 25.0* 22.5* 22.0*  MCV 100.7* 97.6 96.5 95.7 98.2  PLT 211 196 188 199 280   Basic Metabolic Panel: Recent Labs  Lab 10/12/23 1314 10/13/23 0439 10/15/23 0249 10/16/23 0203 10/17/23 0437  NA 133* 134* 130* 132* 132*  K 4.4 4.5 4.2 4.5 4.2  CL 101 101 97* 96* 98  CO2 22 23 22  21* 24  GLUCOSE 120* 128* 112* 132* 98  BUN 33* 29* 34* 32* 43*  CREATININE 1.76* 1.59* 1.67* 1.48* 1.72*  CALCIUM  8.9 9.0 8.7* 8.7* 8.7*  MG 1.8  --   --  1.9  --   PHOS  --   --   --  3.7  --    GFR: Estimated Creatinine Clearance: 18.6 mL/min (A) (by C-G formula based on SCr of 1.72 mg/dL (H)). Liver Function Tests: Recent Labs  Lab 10/12/23 1314 10/13/23 0439  AST 21 19  ALT 13 13  ALKPHOS 72 77  BILITOT 0.6 0.9  PROT 5.9* 6.2*  ALBUMIN 3.4* 3.3*   No results for  input(s): LIPASE, AMYLASE in the last 168 hours. No results for input(s): AMMONIA in the last 168 hours. Coagulation Profile: Recent Labs  Lab 10/13/23 1749 10/14/23 0955 10/15/23 0249 10/16/23 0203 10/17/23 0437  INR 2.0* 2.1* 2.0* 2.1* 2.5*   Cardiac Enzymes: No results for input(s): CKTOTAL, CKMB, CKMBINDEX, TROPONINI in the last 168 hours. BNP (last 3 results) No results for input(s): PROBNP in the last 8760 hours. HbA1C: No results for input(s): HGBA1C in the last 72 hours. CBG: No results for input(s): GLUCAP in the last 168 hours. Lipid Profile: No results for input(s): CHOL, HDL, LDLCALC, TRIG, CHOLHDL, LDLDIRECT in the last 72 hours. Thyroid  Function Tests: No results for input(s): TSH, T4TOTAL, FREET4, T3FREE, THYROIDAB in the last 72 hours. Anemia Panel: No results for input(s): VITAMINB12,  FOLATE, FERRITIN, TIBC, IRON, RETICCTPCT in the last 72 hours.  Sepsis Labs: No results for input(s): PROCALCITON, LATICACIDVEN in the last 168 hours.  Recent Results (from the past 240 hours)  Surgical pcr screen     Status: None   Collection Time: 10/13/23  7:07 AM   Specimen: Nasal Mucosa; Nasal Swab  Result Value Ref Range Status   MRSA, PCR NEGATIVE NEGATIVE Final   Staphylococcus aureus NEGATIVE NEGATIVE Final    Comment: (NOTE) The Xpert SA Assay (FDA approved for NASAL specimens in patients 35 years of age and older), is one component of a comprehensive surveillance program. It is not intended to diagnose infection nor to guide or monitor treatment. Performed at [facility], [address] [place], [place] [address]     Radiology Studies: DG FEMUR PORT MIN 2 VIEWS LEFT Result Date: 10/15/2023 CLINICAL DATA:  Post internal fixation of periprosthetic fracture to the distal femur. EXAM: LEFT FEMUR PORTABLE 2 VIEWS COMPARISON:  Left knee 10/12/2023 FINDINGS: Postoperative changes with long  intramedullary rod, femoral neck compression screws, and lateral plate and screw fixation of the femur. A left total knee arthroplasty is present. Distal left metaphyseal fracture is again demonstrated. Near anatomic position of the fracture fragments is suggested. IMPRESSION: Interval placement of long lateral plate and screw fixation of the femoral shaft to the knee. Distal metaphyseal fracture fragments appear in near anatomic position. Old intramedullary rod, femoral neck compression bold, and left knee arthroplasty hardware remains unchanged. The Electronically Signed   By: Elsie Gravely M.D.   On: 10/15/2023 16:46   DG FEMUR MIN 2 VIEWS LEFT Result Date: 10/15/2023 CLINICAL DATA:  Open reduction internal fixation of periprosthetic distal femur fracture EXAM: LEFT FEMUR 2 VIEWS COMPARISON:  Left knee radiograph dated 10/12/2023 FINDINGS: Seven fluoroscopic images obtained during open reduction internal fixation of periprosthetic fracture. 69.2 seconds fluoro time utilized. Radiation dose 3.9 mGy Kerma. Please see performing physicians operative report for full details. IMPRESSION: Fluoroscopic images were obtained for intraoperative guidance of open reduction internal fixation of periprosthetic distal femur fracture. Electronically Signed   By: Limin  Xu M.D.   On: 10/15/2023 15:38   DG C-Arm 1-60 Min-No Report Result Date: 10/15/2023 Fluoroscopy was utilized by the requesting physician.  No radiographic interpretation.   Scheduled Meds:  sodium chloride    Intravenous Once   sodium chloride    Intravenous Once   anastrozole   1 mg Oral Daily   diltiazem   60 mg Oral Q12H   docusate sodium   100 mg Oral BID   feeding supplement  237 mL Oral BID BM   loratadine   10 mg Oral Daily   metoprolol  succinate  25 mg Oral BID   multivitamin with minerals  1 tablet Oral Daily   pantoprazole   40 mg Oral Daily   rosuvastatin   10 mg Oral Once per day on Sunday Wednesday   sertraline   75 mg Oral QHS    warfarin  1 mg Oral ONCE-1600   Warfarin - Pharmacist Dosing Inpatient   Does not apply q1600   Continuous Infusions:     LOS: 5 days    Time spent: 35 mins    Darcel Dawley, MD [facility]   If 7PM-7AM, please contact night-coverage

## 2023-10-18 ENCOUNTER — Encounter (HOSPITAL_COMMUNITY): Payer: Self-pay | Admitting: Student

## 2023-10-18 DIAGNOSIS — I10 Essential (primary) hypertension: Secondary | ICD-10-CM | POA: Diagnosis not present

## 2023-10-18 DIAGNOSIS — W19XXXA Unspecified fall, initial encounter: Secondary | ICD-10-CM | POA: Diagnosis not present

## 2023-10-18 DIAGNOSIS — Y92009 Unspecified place in unspecified non-institutional (private) residence as the place of occurrence of the external cause: Secondary | ICD-10-CM | POA: Diagnosis not present

## 2023-10-18 LAB — CBC
HCT: 31.1 % — ABNORMAL LOW (ref 36.0–46.0)
Hemoglobin: 10.3 g/dL — ABNORMAL LOW (ref 12.0–15.0)
MCH: 30.7 pg (ref 26.0–34.0)
MCHC: 33.1 g/dL (ref 30.0–36.0)
MCV: 92.6 fL (ref 80.0–100.0)
Platelets: 316 K/uL (ref 150–400)
RBC: 3.36 MIL/uL — ABNORMAL LOW (ref 3.87–5.11)
RDW: 14.6 % (ref 11.5–15.5)
WBC: 12.7 K/uL — ABNORMAL HIGH (ref 4.0–10.5)
nRBC: 0 % (ref 0.0–0.2)

## 2023-10-18 LAB — BASIC METABOLIC PANEL WITH GFR
Anion gap: 10 (ref 5–15)
BUN: 43 mg/dL — ABNORMAL HIGH (ref 8–23)
CO2: 23 mmol/L (ref 22–32)
Calcium: 8.8 mg/dL — ABNORMAL LOW (ref 8.9–10.3)
Chloride: 100 mmol/L (ref 98–111)
Creatinine, Ser: 1.3 mg/dL — ABNORMAL HIGH (ref 0.44–1.00)
GFR, Estimated: 41 mL/min — ABNORMAL LOW (ref >60)
Glucose, Bld: 98 mg/dL (ref 70–99)
Potassium: 4.1 mmol/L (ref 3.5–5.1)
Sodium: 133 mmol/L — ABNORMAL LOW (ref 135–145)

## 2023-10-18 LAB — TYPE AND SCREEN
ABO/RH(D): A POS
Antibody Screen: NEGATIVE
Unit division: 0

## 2023-10-18 LAB — BPAM RBC
Blood Product Expiration Date: 202510172359
ISSUE DATE / TIME: 202509281018
Unit Type and Rh: 6200

## 2023-10-18 LAB — PROTIME-INR
INR: 1.9 — ABNORMAL HIGH (ref 0.8–1.2)
Prothrombin Time: 23 s — ABNORMAL HIGH (ref 11.4–15.2)

## 2023-10-18 MED ORDER — METOPROLOL TARTRATE 5 MG/5ML IV SOLN
5.0000 mg | Freq: Three times a day (TID) | INTRAVENOUS | Status: DC | PRN
  Administered 2023-10-19: 5 mg via INTRAVENOUS
  Filled 2023-10-18 (×2): qty 5

## 2023-10-18 MED ORDER — HYDRALAZINE HCL 10 MG PO TABS
10.0000 mg | ORAL_TABLET | Freq: Three times a day (TID) | ORAL | Status: DC
  Administered 2023-10-18 – 2023-10-20 (×6): 10 mg via ORAL
  Filled 2023-10-18 (×6): qty 1

## 2023-10-18 MED ORDER — HYDROCODONE-ACETAMINOPHEN 5-325 MG PO TABS: 1.0000 | ORAL_TABLET | Freq: Four times a day (QID) | ORAL | 0 refills | Status: DC | PRN

## 2023-10-18 MED ORDER — WARFARIN 1.25 MG HALF TABLET
1.2500 mg | ORAL_TABLET | Freq: Once | ORAL | Status: AC
  Administered 2023-10-18: 1.25 mg via ORAL
  Filled 2023-10-18: qty 1

## 2023-10-18 MED ORDER — METHOCARBAMOL 500 MG PO TABS: 500.0000 mg | ORAL_TABLET | Freq: Four times a day (QID) | ORAL | 0 refills | Status: DC | PRN

## 2023-10-18 NOTE — Progress Notes (Signed)
 PHARMACY - ANTICOAGULATION CONSULT NOTE  Pharmacy Consult for warfarin Indication: atrial fibrillation  Allergies  Allergen Reactions   Amoxil [Amoxicillin] Diarrhea   Celebrex [Celecoxib] Other (See Comments)    Bruising and stomach pain   Keppra  [Levetiracetam ] Other (See Comments)    Somnolence    Patient Measurements: Height: 4' 11 (149.9 cm) Weight: 54.2 kg (119 lb 7.8 oz) IBW/kg (Calculated) : 43.2 HEPARIN DW (KG): 54.1  Vital Signs: Temp: 97.7 F (36.5 C) (09/29 0801) Temp Source: Oral (09/29 0419) BP: 166/108 (09/29 0801) Pulse Rate: 126 (09/29 0801)  Labs: Recent Labs    10/16/23 0203 10/17/23 0437 10/17/23 1855 10/18/23 0318  HGB 7.4* 7.0* 10.0* 10.3*  HCT 22.5* 22.0* 31.1* 31.1*  PLT 199 280  --  316  LABPROT 24.6* 28.4*  --  23.0*  INR 2.1* 2.5*  --  1.9*  CREATININE 1.48* 1.72*  --  1.30*    Estimated Creatinine Clearance: 24.6 mL/min (A) (by C-G formula based on SCr of 1.3 mg/dL (H)).   Medical History: Past Medical History:  Diagnosis Date   Allergy    Atrial fibrillation (HCC)    Dysrhythmia    Hyperlipidemia    Hypertension    Neuromuscular disorder (HCC)    Venous insufficiency       Assessment: 62 yoF admitted with fall and AF. Pt on warfarin PTA which was held for ORIF 9/26. Pharmacy to resume warfarin 9/27 with no bridge.  Per husband (10/16/23), PTA dose 2mg  TTSat, 1mg  MWFSun (which also aligns with 10/06/23 anti-coag visit note).   9/29 AM update: INR 1.9 Hgb 10.3 PLT wnl  Patient charted as eating ~25% of meals offered on 9/28. This is down from what was charted on 9/25 (50%)  Goal of Therapy:  INR 2-3 Monitor platelets by anticoagulation protocol: Yes   Plan:  Warfarin 1.25 mg x1 tonight Daily INR  Kobe Ofallon BS, PharmD, BCPS Clinical Pharmacist 10/18/2023 8:06 AM  Contact: [phone] after 3 PM

## 2023-10-18 NOTE — Progress Notes (Signed)
 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  X-ray of the left hip negative for acute fracture or dislocation.  X-ray of the left knee shows acute fracture of distal left femur, orthopedics  consulted.  Patient also found to have A-fib with RVR started on Cardizem  gtt.  Cardiology consulted.  Assessment & Plan:   Principal Problem:   Fall at home, initial encounter  Acute fracture of left distal femur : Status post fall: Patient presented status post mechanical fall at home. X-ray left knee showed acute fracture of distal left femur. Orthopedics is consulted. Continue adequate pain control. Anticoagulation was held and now resumed postoperatively. Patient is status post ORIF POD # 4 Patient has received 1 unit FFP.  INR remains 2.1. Resumed Coumadin  postoperatively. PT and OT recommended SNF. Ortho signed off.  Follow-up with Dr. Kendal in 2 weeks for wound check and repeat x-ray.  Paroxysmal A-fib with RVR: EKG showed A-fib with RVR. Patient started on Cardizem  gtt.  Heart rate now remains controlled. Continue telemetry. Continue to monitor heart rate. Cardizem  gtt. discontinued. Continue Cardizem  SR 60 mg q12hr  and metoprolol  25 mg q 12hr Continue Coumadin , INR 2.1.  Acute metabolic encephalopathy likely due to polypharmacy. Patient has sudden change in the mental status. CT head rule out acute CVA. Mental status back to baseline.  CKD stage IIIb: Serum creatinine is at baseline. Avoid nephrotoxic medications.  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    Continue seizure precautions, fall precautions, aspiration precautions, no AED on medication list.  Essential hypertension: Continue Cardizem  60 mg q 12hr Continue metoprolol  25 mg  q12hr Continue Aldactone  25 mg daily.  Normochromic normocytic anemia: Likely postoperative.  Hemoglobin dropped from 8.1->7.4> 7.0 No obvious physical bleeding.  Continue to monitor H&H. Transfuse 1 unit PRBC.  Hemoglobin improved to 10.3.   DVT prophylaxis: SCDs Code Status:full code Family Communication: No family at bed side. Disposition Plan:   Status is: Inpatient Remains inpatient appropriate because:  Patient is status post ORIF. Patient is medically clear,  awaiting SNF authorization for skilled nursing.    Consultants:  Cardiology Orthopedics  Procedures: Antimicrobials:  Anti-infectives (From admission, onward)    Start     Dose/Rate Route Frequency Ordered Stop   10/15/23 2200  ceFAZolin  (ANCEF ) IVPB 2g/100 mL premix        2 g 200 mL/hr over 30 Minutes Intravenous Every 12 hours 10/15/23 1336 10/16/23 0853   10/15/23 1120  vancomycin  (VANCOCIN ) powder  Status:  Discontinued          As needed 10/15/23 1120 10/15/23 1154   10/15/23 0936  ceFAZolin  (ANCEF ) 2-4 GM/100ML-% IVPB       Note to Pharmacy: LORILEE, GRETA: cabinet override      10/15/23 0936 10/15/23 2144   10/14/23 1100  ceFAZolin  (ANCEF ) IVPB 2g/100 mL premix  Status:  Discontinued        2 g 200 mL/hr over 30 Minutes Intravenous On call to O.R. 10/14/23 0533 10/14/23 1133       Subjective: Patient was seen and examined at bedside.Overnight events noted. Patient is status post ORIF.  POD # 4.  Patient reports doing better. Heart rate relatively stable.  Hemoglobin improved after 1 unit PRBC.  Objective: Vitals:   10/17/23 2038 10/17/23 2059 10/18/23 0419 10/18/23 0801  BP: (!) 164/114 (!) 174/86 (!) 162/84 (!) 166/108  Pulse: (!) 106 (!) 104 70 (!) 126  Resp: 19  16 15   Temp: 98.2 F (36.8 C)  98.6 F (37  C) 97.7 F (36.5 C)  TempSrc:   Oral   SpO2: 96% 100% 98% 100%  Weight:      Height:        Intake/Output Summary (Last 24 hours) at 10/18/2023 1353 Last data filed at 10/18/2023 1100 Gross per 24 hour  Intake 240 ml  Output 1075 ml  Net -835 ml   Filed Weights   10/12/23 1247 10/13/23 1358 10/15/23 0922  Weight: 59 kg 54.2 kg 54.2 kg    Examination:  General exam: Appears calm and comfortable, deconditioned, not in any acute distress. Respiratory system: CTA Bilaterally . Respiratory effort normal.  RR 14 Cardiovascular system: S1 & S2 heard, Irregular Rhythm. No JVD, murmurs, rubs, gallops or clicks.  Gastrointestinal system: Abdomen is non distended, soft and non tender.  Normal bowel sounds heard. Central nervous system: Alert and oriented x 2. No focal neurological deficits. Extremities: Left Hip tenderness +, s/p ORIF POD # 3 . Skin: No rashes, lesions or ulcers Psychiatry:  Mood & affect appropriate.     Data Reviewed: I have personally reviewed following labs and imaging studies  CBC: Recent Labs  Lab 10/12/23 1314 10/13/23 0439 10/15/23 0249 10/16/23 0203 10/17/23 0437 10/17/23 1855 10/18/23 0318  WBC 9.1 9.9 11.7* 10.2 11.5*  --  12.7*  NEUTROABS 7.3  --   --   --   --   --   --   HGB 9.1* 8.9* 8.1* 7.4* 7.0* 10.0* 10.3*  HCT 29.1* 28.0* 25.0* 22.5* 22.0* 31.1* 31.1*  MCV 100.7* 97.6 96.5 95.7 98.2  --  92.6  PLT 211 196 188 199 280  --  316   Basic Metabolic Panel: Recent Labs  Lab 10/12/23 1314 10/13/23 0439 10/15/23 0249 10/16/23 0203 10/17/23 0437 10/18/23 0318  NA 133* 134* 130* 132* 132* 133*  K 4.4 4.5 4.2 4.5 4.2 4.1  CL 101 101 97* 96* 98 100  CO2 22 23 22  21* 24 23  GLUCOSE 120* 128* 112* 132* 98 98  BUN 33* 29* 34* 32* 43* 43*  CREATININE 1.76* 1.59* 1.67* 1.48* 1.72* 1.30*  CALCIUM  8.9 9.0 8.7* 8.7* 8.7* 8.8*  MG 1.8  --   --  1.9  --   --   PHOS  --   --   --  3.7  --   --    GFR: Estimated Creatinine Clearance: 24.6  mL/min (A) (by C-G formula based on SCr of 1.3 mg/dL (H)). Liver Function Tests: Recent Labs  Lab 10/12/23 1314 10/13/23 0439  AST 21 19  ALT 13 13  ALKPHOS 72 77  BILITOT 0.6 0.9  PROT 5.9* 6.2*  ALBUMIN 3.4* 3.3*   No results for input(s): LIPASE, AMYLASE in the last 168 hours. No results for input(s): AMMONIA in the last 168 hours. Coagulation Profile: Recent Labs  Lab 10/14/23 0955 10/15/23 0249 10/16/23 0203 10/17/23 0437 10/18/23 0318  INR 2.1* 2.0* 2.1* 2.5* 1.9*   Cardiac Enzymes: No results for input(s): CKTOTAL, CKMB, CKMBINDEX, TROPONINI in the last 168 hours. BNP (last 3 results) No results for input(s): PROBNP in the last  8760 hours. HbA1C: No results for input(s): HGBA1C in the last 72 hours. CBG: No results for input(s): GLUCAP in the last 168 hours. Lipid Profile: No results for input(s): CHOL, HDL, LDLCALC, TRIG, CHOLHDL, LDLDIRECT in the last 72 hours. Thyroid  Function Tests: No results for input(s): TSH, T4TOTAL, FREET4, T3FREE, THYROIDAB in the last 72 hours. Anemia Panel: No results for input(s): VITAMINB12, FOLATE, FERRITIN, TIBC, IRON, RETICCTPCT in the last 72 hours.  Sepsis Labs: No results for input(s): PROCALCITON, LATICACIDVEN in the last 168 hours.  Recent Results (from the past 240 hours)  Surgical pcr screen     Status: None   Collection Time: 10/13/23  7:07 AM   Specimen: Nasal Mucosa; Nasal Swab  Result Value Ref Range Status   MRSA, PCR NEGATIVE NEGATIVE Final   Staphylococcus aureus NEGATIVE NEGATIVE Final    Comment: (NOTE) The Xpert SA Assay (FDA approved for NASAL specimens in patients 69 years of age and older), is one component of a comprehensive surveillance program. It is not intended to diagnose infection nor to guide or monitor treatment. Performed at [facility], [address], [place], [place] [address]     Radiology Studies: No results  found.  Scheduled Meds:  sodium chloride    Intravenous Once   sodium chloride    Intravenous Once   anastrozole   1 mg Oral Daily   diltiazem   60 mg Oral Q12H   docusate sodium   100 mg Oral BID   feeding supplement  237 mL Oral BID BM   loratadine   10 mg Oral Daily   metoprolol  succinate  25 mg Oral BID   multivitamin with minerals  1 tablet Oral Daily   pantoprazole   40 mg Oral Daily   rosuvastatin   10 mg Oral Once per day on Sunday Wednesday   sertraline   75 mg Oral QHS   warfarin  1.25 mg Oral ONCE-1600   Warfarin - Pharmacist Dosing Inpatient   Does not apply q1600   Continuous Infusions:     LOS: 6 days    Time spent: 35 mins    Darcel Dawley, MD [facility]   If 7PM-7AM, please contact night-coverage

## 2023-10-18 NOTE — NC FL2 (Cosign Needed Addendum)
 [place]  MEDICAID [id number] LEVEL OF CARE FORM     IDENTIFICATION  Patient Name: Brandi [name] Birthdate: 07/30/1940 Sex: female Admission Date (Current Location): 10/12/2023  [place] and [place] Number:  Producer, television/film/video and Address:         Provider Number:    Attending Physician Name and Address:  Leotis Bogus, MD  Relative Name and Phone Number:       Current Level of Care: Hospital Recommended Level of Care: Skilled Nursing Facility Prior Approval Number:    Date Approved/Denied:   PASRR Number: [id number]  Discharge Plan: SNF    Current Diagnoses: Patient Active Problem List   Diagnosis Date Noted   Fall at home, initial encounter 10/12/2023   Hyponatremia 07/29/2023   Subdural hematoma (HCC) 07/28/2023   Head injury 07/28/2023   Seizure (HCC) 07/28/2023   Fall 07/28/2023   Malignant neoplasm of overlapping sites of right breast in female, estrogen receptor positive (HCC) 05/10/2023   Falls frequently 11/11/2022   Asymptomatic bacteriuria 11/11/2022   Closed intertrochanteric fracture of left femur, initial encounter (HCC) 11/10/2022   Closed traumatic fractures of multiple bones of right hip and pelvis, initial encounter (HCC) 08/25/2022   Chronic kidney disease, stage 3b (HCC) 08/25/2022   Compression fracture of T10 vertebra (HCC) 08/25/2022   Essential hypertension 08/25/2022   Acute renal failure superimposed on stage 3b chronic kidney disease (HCC) 08/14/2021   Hypokalemia 08/14/2021   Generalized weakness 08/14/2021   Acute metabolic encephalopathy 08/14/2021   Paroxysmal atrial fibrillation (HCC) 08/14/2021   Normocytic anemia 08/14/2021   Long term (current) use of anticoagulants 08/13/2021   Secondary hypercoagulable state 07/17/2021   Atrial flutter (HCC) 07/11/2021   CAP (community acquired pneumonia) 07/07/2021   Acute respiratory failure with hypoxia (HCC) 07/07/2021   AKI (acute kidney injury) 07/07/2021   Troponin level  elevated 07/07/2021   Vasovagal episode 06/08/2017   Mixed hyperlipidemia 06/08/2017   Chest pain with moderate risk for cardiac etiology 06/07/2017   Abnormal EKG 06/07/2017   Depression 03/18/2012   Mild obesity 03/18/2012    Orientation RESPIRATION BLADDER Height & Weight     Self  Normal Incontinent Weight: 54.2 kg Height:  4' 11 (149.9 cm)  BEHAVIORAL SYMPTOMS/MOOD NEUROLOGICAL BOWEL NUTRITION STATUS      Incontinent Diet (refer to d/c summary)  AMBULATORY STATUS COMMUNICATION OF NEEDS Skin   Extensive Assist Verbally Normal (s/p (ORIF) PERIPROSTHETIC FRACTURE (Left), 9/26)                       Personal Care Assistance Level of Assistance  Bathing, Feeding, Dressing Bathing Assistance: Maximum assistance Feeding assistance: Independent Dressing Assistance: Maximum assistance     Functional Limitations Info  Sight, Hearing, Speech Sight Info: Adequate Hearing Info: Adequate Speech Info: Adequate    SPECIAL CARE FACTORS FREQUENCY  PT (By licensed PT), OT (By licensed OT)     PT Frequency: 5x/week, evaluate and treat OT Frequency: 5x/week, evaluate and treat            Contractures Contractures Info: Not present    Additional Factors Info  Code Status, Allergies Code Status Info: Full Code Allergies Info: Amoxil (Amoxicillin), Celebrex (Celecoxib), Keppra  (Levetiracetam )           Current Medications (10/18/2023):  This is the current hospital active medication list Current Facility-Administered Medications  Medication Dose Route Frequency Provider Last Rate Last Admin   0.9 %  sodium chloride  infusion (Manually program via Guardrails IV  Fluids)   Intravenous Once Danton Domino A, PA-C       0.9 %  sodium chloride  infusion (Manually program via Guardrails IV Fluids)   Intravenous Once Leotis Bogus, MD       acetaminophen  (TYLENOL ) tablet 325-650 mg  325-650 mg Oral Q6H PRN Danton Domino LABOR, PA-C       anastrozole  (ARIMIDEX ) tablet 1 mg  1 mg  Oral Daily Danton Domino LABOR, PA-C   1 mg at 10/18/23 1041   bisacodyl  (DULCOLAX) EC tablet 5 mg  5 mg Oral Daily PRN Danton Domino LABOR, PA-C       diltiazem  (CARDIZEM  SR) 12 hr capsule 60 mg  60 mg Oral Q12H Jeffrie Oneil BROCKS, MD   60 mg at 10/18/23 1041   docusate sodium  (COLACE) capsule 100 mg  100 mg Oral BID Danton Domino LABOR, PA-C   100 mg at 10/18/23 1041   feeding supplement (ENSURE PLUS HIGH PROTEIN) liquid 237 mL  237 mL Oral BID BM Danton Domino LABOR, PA-C   237 mL at 10/18/23 1449   HYDROcodone -acetaminophen  (NORCO/VICODIN) 5-325 MG per tablet 1-2 tablet  1-2 tablet Oral Q6H PRN Danton Domino LABOR, PA-C   1 tablet at 10/16/23 0023   loratadine  (CLARITIN ) tablet 10 mg  10 mg Oral Daily Danton Domino LABOR, PA-C   10 mg at 10/18/23 1040   methocarbamol  (ROBAXIN ) tablet 500 mg  500 mg Oral Q6H PRN Danton Domino LABOR, PA-C   500 mg at 10/14/23 2151   Or   methocarbamol  (ROBAXIN ) injection 500 mg  500 mg Intravenous Q6H PRN Danton Domino LABOR, PA-C       metoCLOPramide  (REGLAN ) tablet 5-10 mg  5-10 mg Oral Q8H PRN Danton Domino LABOR, PA-C       Or   metoCLOPramide  (REGLAN ) injection 5-10 mg  5-10 mg Intravenous Q8H PRN Danton Domino LABOR, PA-C       metoprolol  succinate (TOPROL -XL) 24 hr tablet 25 mg  25 mg Oral BID Danton Domino LABOR, PA-C   25 mg at 10/18/23 1040   metoprolol  tartrate (LOPRESSOR ) injection 5 mg  5 mg Intravenous Q8H PRN Leotis Bogus, MD       morphine  (PF) 2 MG/ML injection 0.5-1 mg  0.5-1 mg Intravenous Q2H PRN Danton, Sarah A, PA-C       multivitamin with minerals tablet 1 tablet  1 tablet Oral Daily Danton Domino LABOR, PA-C   1 tablet at 10/18/23 1040   ondansetron  (ZOFRAN ) tablet 4 mg  4 mg Oral Q6H PRN Danton Domino LABOR, PA-C       Or   ondansetron  (ZOFRAN ) injection 4 mg  4 mg Intravenous Q6H PRN Danton Domino LABOR, PA-C       Oral care mouth rinse  15 mL Mouth Rinse PRN Danton Domino LABOR, PA-C       pantoprazole  (PROTONIX ) EC tablet 40 mg  40 mg Oral Daily Danton Domino LABOR, PA-C   40 mg  at 10/18/23 1040   polyethylene glycol (MIRALAX  / GLYCOLAX ) packet 17 g  17 g Oral Daily PRN Danton Domino LABOR, PA-C       rosuvastatin  (CRESTOR ) tablet 10 mg  10 mg Oral Once per day on Sunday Wednesday Danton Domino LABOR, PA-C   10 mg at 10/17/23 0915   sertraline  (ZOLOFT ) tablet 75 mg  75 mg Oral QHS Danton Domino LABOR, PA-C   75 mg at 10/17/23 2056   warfarin (COUMADIN ) tablet 1.25 mg  1.25 mg Oral ONCE-1600 Reome, Earle J,  RPH       Warfarin - Pharmacist Dosing Inpatient   Does not apply q1600 Bitonti, Michael T, [facility]   Given at 10/17/23 1650     Discharge Medications: Please see discharge summary for a list of discharge medications.  Relevant Imaging Results:  Relevant Lab Results:   Additional Information SSN: [id number]  Rosalva Jon Bloch, RN

## 2023-10-18 NOTE — TOC Progression Note (Signed)
 Transition of Care [facility]) - Progression Note    Patient Details  Name: Brandi [name] MRN: [id number] Date of Birth: May 29, 1940  [facility]) CM/SW Contact  Rosalva Jon Bloch, RN Phone Number: 10/18/2023, 2:30 PM  Clinical Narrative:     RNCM received consult for possible SNF placement at time of discharge. Pt confused ,oriented x 1. RNCM spoke with patient's spouse [name] regarding  PT's recommendation of SNF placement at time of discharge. Spouse  states he is currently unable to care for patient at their home given patient's current physical needs and fall risk. [name] expressed understanding of PT recommendation and is agreeable to SNF placement at time of discharge for wife. [name] without  preference for  SNF placement , except for [facility] or  [facility]. RNCM discussed insurance authorization process and provided Medicare SNF ratings list left @ bedside for husband. No further questions reported at this time. RNCM to continue to follow and assist with discharge planning needs.    Expected Discharge Plan: Skilled Nursing Facility Barriers to Discharge: Continued Medical Work up               Expected Discharge Plan and Services   Discharge Planning Services: CM Consult   Living arrangements for the past 2 months: Single Family Home                                       Social Drivers of Health (SDOH) Interventions SDOH Screenings   Food Insecurity: Patient Unable To Answer (10/13/2023)  Housing: Patient Unable To Answer (10/13/2023)  Transportation Needs: Unknown (10/13/2023)  Utilities: Patient Unable To Answer (10/13/2023)  Depression (PHQ2-9): Low Risk  (09/14/2023)  Social Connections: Patient Unable To Answer (10/13/2023)  Tobacco Use: Low Risk  (10/15/2023)    Readmission Risk Interventions    07/30/2023   11:14 AM 11/12/2022    3:06 PM  Readmission Risk Prevention Plan  Transportation Screening Complete Complete  PCP or Specialist  Appt within 3-5 Days  Complete  HRI or Home Care Consult Complete Complete  Social Work Consult for Recovery Care Planning/Counseling Complete   Palliative Care Screening Not Applicable Complete  Medication Review Oceanographer) Complete Complete

## 2023-10-19 LAB — PROTIME-INR
INR: 1.7 — ABNORMAL HIGH (ref 0.8–1.2)
Prothrombin Time: 20.6 s — ABNORMAL HIGH (ref 11.4–15.2)

## 2023-10-19 MED ORDER — WARFARIN SODIUM 2 MG PO TABS
2.0000 mg | ORAL_TABLET | Freq: Once | ORAL | Status: AC
Start: 2023-10-19 — End: 2023-10-19
  Administered 2023-10-19: 2 mg via ORAL
  Filled 2023-10-19: qty 1

## 2023-10-19 MED ORDER — DILTIAZEM HCL ER 90 MG PO CP12
90.0000 mg | ORAL_CAPSULE | Freq: Two times a day (BID) | ORAL | Status: DC
  Administered 2023-10-19 – 2023-10-20 (×2): 90 mg via ORAL
  Filled 2023-10-19 (×3): qty 1

## 2023-10-19 NOTE — Progress Notes (Signed)
   10/19/23 0749  Assess: MEWS Score  Temp 99.8 F (37.7 C)  BP (!) 153/97  MAP (mmHg) 100  Pulse Rate (!) 110  ECG Heart Rate 100  Resp 13  SpO2 95 %  O2 Device Room Air  Assess: MEWS Score  MEWS Temp 0  MEWS Systolic 0  MEWS Pulse 0  MEWS RR 1  MEWS LOC 0  MEWS Score 1  MEWS Score Color Green  Assess: if the MEWS score is Yellow or Red  Were vital signs accurate and taken at a resting state? Yes  MEWS guidelines implemented  Yes, yellow  Treat  MEWS Interventions Considered administering scheduled or prn medications/treatments as ordered  Take Vital Signs  Increase Vital Sign Frequency  Yellow: Q2hr x1, continue Q4hrs until patient remains green for 12hrs  Escalate  MEWS: Escalate Yellow: Discuss with charge nurse and consider notifying provider and/or RRT  Notify: Charge Nurse/RN  Name of Charge Nurse/RN Notified Tinnie Goldberg RN  Provider Notification  Date Provider Notified 10/19/23  Time Provider Notified 0800  Method of Notification Page  Notification Reason Other (Comment)  Provider response No new orders  Date of Provider Response 10/19/23  Time of Provider Response 0800  Assess: SIRS CRITERIA  SIRS Temperature  0  SIRS Respirations  0  SIRS Pulse 1  SIRS WBC 0  SIRS Score Sum  1

## 2023-10-19 NOTE — Progress Notes (Signed)
 Pt seen in room in no apparent distress. Denies any pain /discomfort at this time.   No complaints.

## 2023-10-19 NOTE — Progress Notes (Signed)
 Nutrition Follow Up  DOCUMENTATION CODES:   Not applicable  INTERVENTION:  Continue Ensure Plus High Protein po BID, each supplement provides 350 kcal and 20 grams of protein   Continue Magic cup BID with meals, each supplement provides 290 kcal and 9 grams of protein   Continue MVI w/ minerals  Liberalized diet to provide increased options and promote adequate intake to support post op recovery  NUTRITION DIAGNOSIS:  Increased nutrient needs related to post-op healing as evidenced by estimated needs. Remains applicable  GOAL:  Patient will meet greater than or equal to 90% of their needs Progressing  MONITOR:  Supplement acceptance, PO intake  REASON FOR ASSESSMENT:  Consult Assessment of nutrition requirement/status  ASSESSMENT:   Pt with PMH significant for: afib, HLD, HTN, obesity, CKD III, seizures and recurrent falls. Presented to ED with c/o hip pain and found to have acute fracture of left distal femur s/p fall.  9/26 ORIF  Pt is stable for discharge, will likely be discharged in next day or so to SNF. Pt reports good appetite, diet documentation shows pt eating on average 44% of last 5 meals. Pt reports eating Magic cups and drinking Ensure shakes, suspect pt is meeting needs with combination of meals and ONS. Encouraged pt to continue ONS once discharged to facility. Pt reports no issues at this time, but feels she may be constipated as she has not had a bowel movement recently and feels she is due for one, currently prescribed colace. Will continue to monitor intake and bowel regimen.   Average Meal Intake 9/25-9/29: 44% average intake x 5 recorded meals  Admit Weight: 59 kg - appears stated Current Weight: 54.2 kg  Meds: docusate sodium , pantoprazole , MVI w/ minerals, zoloft    Labs reviewed   Diet Order:   Diet Order             Diet NPO time specified  Diet effective midnight             EDUCATION NEEDS:   Not appropriate for education at this  time  Skin:  Skin Assessment: Reviewed RN Assessment  Last BM:  PTA  Height:  Ht Readings from Last 1 Encounters:  10/15/23 4' 11 (1.499 m)   Weight:  Wt Readings from Last 1 Encounters:  10/15/23 54.2 kg   Ideal Body Weight:  43.2 kg  BMI:  Body mass index is 24.13 kg/m.  Estimated Nutritional Needs:   Kcal:  1500-1700 kcals  Protein:  70-85g  Fluid:  1.5-1.7L/day   Josette Glance, MS, RDN, LDN Clinical Dietitian I Please reach out via secure chat

## 2023-10-19 NOTE — Plan of Care (Signed)
  Problem: Education: Goal: Knowledge of General Education information will improve Description: Including pain rating scale, medication(s)/side effects and non-pharmacologic comfort measures Outcome: Progressing   Problem: Activity: Goal: Risk for activity intolerance will decrease Outcome: Progressing   Problem: Pain Managment: Goal: General experience of comfort will improve and/or be controlled Outcome: Progressing

## 2023-10-19 NOTE — TOC Progression Note (Addendum)
 Transition of Care [facility]) - Progression Note    Patient Details  Name: Brandi [name] MRN: [id number] Date of Birth: 1940-07-30  Transition of [facility]) CM/SW Contact  Bridget Cordella Simmonds, LCSW Phone Number: 10/19/2023, 10:17 AM  Clinical Narrative:   Pt oriented x2.  CSW called pt daughter, no answer,  bed offers provided to pt husband Brandi [name].  He would like to accept offer at [facility].    [facility] confirmed they can receive pt today with approved auth.  New PT note needed for insurance auth.  PT aware.   1100: SNF auth request submitted in [place].       Expected Discharge Plan: Skilled Nursing Facility Barriers to Discharge: Continued Medical Work up               Expected Discharge Plan and Services   Discharge Planning Services: CM Consult   Living arrangements for the past 2 months: Single Family Home                                       Social Drivers of Health (SDOH) Interventions SDOH Screenings   Food Insecurity: Patient Unable To Answer (10/13/2023)  Housing: Patient Unable To Answer (10/13/2023)  Transportation Needs: Unknown (10/13/2023)  Utilities: Patient Unable To Answer (10/13/2023)  Depression (PHQ2-9): Low Risk  (09/14/2023)  Social Connections: Patient Unable To Answer (10/13/2023)  Tobacco Use: Low Risk  (10/15/2023)    Readmission Risk Interventions    07/30/2023   11:14 AM 11/12/2022    3:06 PM  Readmission Risk Prevention Plan  Transportation Screening Complete Complete  PCP or Specialist Appt within 3-5 Days  Complete  HRI or Home Care Consult Complete Complete  Social Work Consult for Recovery Care Planning/Counseling Complete   Palliative Care Screening Not Applicable Complete  Medication Review Oceanographer) Complete Complete

## 2023-10-19 NOTE — Progress Notes (Signed)
 PHARMACY - ANTICOAGULATION CONSULT NOTE  Pharmacy Consult for warfarin Indication: atrial fibrillation  Allergies  Allergen Reactions   Amoxil [Amoxicillin] Diarrhea   Celebrex [Celecoxib] Other (See Comments)    Bruising and stomach pain   Keppra  [Levetiracetam ] Other (See Comments)    Somnolence    Patient Measurements: Height: 4' 11 (149.9 cm) Weight: 54.2 kg (119 lb 7.8 oz) IBW/kg (Calculated) : 43.2 HEPARIN DW (KG): 54.1  Vital Signs: Temp: 98.5 F (36.9 C) (09/30 0246) Temp Source: Oral (09/30 0246) BP: 154/85 (09/30 0246) Pulse Rate: 112 (09/30 0246)  Labs: Recent Labs    10/17/23 0437 10/17/23 1855 10/18/23 0318 10/19/23 0412  HGB 7.0* 10.0* 10.3*  --   HCT 22.0* 31.1* 31.1*  --   PLT 280  --  316  --   LABPROT 28.4*  --  23.0* 20.6*  INR 2.5*  --  1.9* 1.7*  CREATININE 1.72*  --  1.30*  --     Estimated Creatinine Clearance: 24.6 mL/min (A) (by C-G formula based on SCr of 1.3 mg/dL (H)).   Medical History: Past Medical History:  Diagnosis Date   Allergy    Atrial fibrillation (HCC)    Dysrhythmia    Hyperlipidemia    Hypertension    Neuromuscular disorder (HCC)    Venous insufficiency       Assessment: 65 yoF admitted with fall and AF. Pt on warfarin PTA which was held for ORIF 9/26. Pharmacy to resume warfarin 9/27 with no bridge.  Per husband (10/16/23), PTA dose 2mg  TTSat, 1mg  MWFSun (which also aligns with 10/06/23 anti-coag visit note).   9/29 AM update: INR 1.7 Patient charted as eating ~75% of meals offered on 9/29. This is up from what was charted on 9/28 (25\%)  Goal of Therapy:  INR 2-3 Monitor platelets by anticoagulation protocol: Yes   Plan:  Warfarin 2 mg x1 tonight Daily INR  Bentley Fissel BS, PharmD, BCPS Clinical Pharmacist 10/19/2023 7:07 AM  Contact: [phone] after 3 PM

## 2023-10-19 NOTE — Progress Notes (Signed)
 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  X-ray of the left hip negative for acute fracture or dislocation.  X-ray of the left knee shows acute fracture of distal left femur, orthopedics  consulted.  Patient also found to have A-fib with RVR started on Cardizem  gtt.  Cardiology consulted.  Assessment & Plan:   Principal Problem:   Fall at home, initial encounter  Acute fracture of left distal femur: Status post fall: Patient presented status post mechanical fall at home. X-ray left knee showed acute fracture of distal left femur. Orthopedics is consulted. Continue adequate pain control. Anticoagulation was held and now resumed postoperatively. Patient is status post ORIF POD # 5 Patient has received 1 unit FFP.  INR remains 2.1. Resumed Coumadin  postoperatively. PT and OT recommended SNF. Ortho signed off.  Follow-up with Dr. Kendal in 2 weeks for wound check and repeat x-ray.  Paroxysmal A-fib with RVR: EKG showed A-fib with RVR. Patient started on Cardizem  gtt.  Heart rate now remains controlled. Continue telemetry. Continue to monitor heart rate. Cardizem  gtt. discontinued. Continue Cardizem  SR 60 mg q12hr  and metoprolol  25 mg q 12hr Continue Coumadin , INR 2.1. Cardizem  increased to 90 mg every 12 hours for better heart rate control.  Acute metabolic encephalopathy likely due to polypharmacy. Patient has sudden change in the mental status. CT head rule out acute CVA. Mental status back to baseline.  CKD stage IIIb: Serum creatinine is at baseline. Avoid nephrotoxic  medications.  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   Continue seizure precautions, fall precautions, aspiration precautions, no AED on medication list.  Essential hypertension: Continue Cardizem  90 mg q 12hr Continue metoprolol  25 mg  q12hr Continue Aldactone  25 mg daily.  Normochromic normocytic anemia: Likely postoperative.  Hemoglobin dropped from 8.1->7.4> 7.0 No obvious physical bleeding.  Continue to monitor H&H. Transfuse 1 unit PRBC.  Hemoglobin improved to 10.3.   DVT prophylaxis: Coumadin  Code Status:full code Family Communication: No family at bed side. Disposition Plan:   Status is: Inpatient Remains inpatient appropriate because:  Patient is status post ORIF. Patient is medically clear,  awaiting SNF authorization for skilled nursing.    Consultants:  Cardiology Orthopedics  Procedures: Antimicrobials:  Anti-infectives (From admission, onward)    Start     Dose/Rate Route Frequency Ordered Stop   10/15/23 2200  ceFAZolin  (ANCEF ) IVPB 2g/100 mL premix        2 g 200 mL/hr over 30 Minutes Intravenous Every 12 hours 10/15/23 1336 10/16/23 0853   10/15/23 1120  vancomycin  (VANCOCIN ) powder  Status:  Discontinued          As needed 10/15/23 1120 10/15/23 1154   10/15/23 0936  ceFAZolin  (ANCEF ) 2-4 GM/100ML-% IVPB       Note to Pharmacy: Operating Room Services, GRETA: cabinet override      10/15/23 0936 10/15/23 2144   10/14/23 1100  ceFAZolin  (ANCEF ) IVPB 2g/100 mL premix  Status:  Discontinued        2 g 200 mL/hr over 30 Minutes Intravenous On call to O.R. 10/14/23 0533 10/14/23 1133       Subjective: Patient was  seen and examined at bedside.Overnight events noted. Patient is status post ORIF. POD # 5.  Patient reports doing better. Heart rate relatively stable.  Hemoglobin improved after 1 unit PRBC.  Objective: Vitals:   10/18/23 2015 10/19/23 0246 10/19/23 0749 10/19/23 1200  BP: (!) 149/113 (!) 154/85 (!) 153/97 122/80  Pulse: 95 (!)  112 (!) 110 100  Resp: 16 16 13 15   Temp: 97.9 F (36.6 C) 98.5 F (36.9 C) 99.8 F (37.7 C) 97.8 F (36.6 C)  TempSrc:  Oral Oral Oral  SpO2: 100% 97% 95% 97%  Weight:      Height:        Intake/Output Summary (Last 24 hours) at 10/19/2023 1404 Last data filed at 10/19/2023 0900 Gross per 24 hour  Intake 360 ml  Output 725 ml  Net -365 ml   Filed Weights   10/12/23 1247 10/13/23 1358 10/15/23 0922  Weight: 59 kg 54.2 kg 54.2 kg    Examination:  General exam: Appears calm and comfortable, deconditioned, not in any acute distress. Respiratory system: CTA Bilaterally . Respiratory effort normal.  RR 14 Cardiovascular system: S1 & S2 heard, Irregular Rhythm. No JVD, murmurs, rubs, gallops or clicks.  Gastrointestinal system: Abdomen is non distended, soft and non tender.  Normal bowel sounds heard. Central nervous system: Alert and oriented x 3. No focal neurological deficits. Extremities: Left Hip tenderness +, s/p ORIF POD # 4 . Skin: No rashes, lesions or ulcers Psychiatry:  Mood & affect appropriate.   Data Reviewed: I have personally reviewed following labs and imaging studies  CBC: Recent Labs  Lab 10/13/23 0439 10/15/23 0249 10/16/23 0203 10/17/23 0437 10/17/23 1855 10/18/23 0318  WBC 9.9 11.7* 10.2 11.5*  --  12.7*  HGB 8.9* 8.1* 7.4* 7.0* 10.0* 10.3*  HCT 28.0* 25.0* 22.5* 22.0* 31.1* 31.1*  MCV 97.6 96.5 95.7 98.2  --  92.6  PLT 196 188 199 280  --  316   Basic Metabolic Panel: Recent Labs  Lab 10/13/23 0439 10/15/23 0249 10/16/23 0203 10/17/23 0437 10/18/23 0318  NA 134* 130* 132* 132* 133*  K 4.5 4.2 4.5 4.2 4.1  CL 101 97* 96* 98 100  CO2 23 22 21* 24 23  GLUCOSE 128* 112* 132* 98 98  BUN 29* 34* 32* 43* 43*  CREATININE 1.59* 1.67* 1.48* 1.72* 1.30*  CALCIUM  9.0 8.7* 8.7* 8.7* 8.8*  MG  --   --  1.9  --   --   PHOS  --   --  3.7  --   --    GFR: Estimated Creatinine Clearance: 24.6 mL/min (A) (by C-G formula based on SCr of 1.3 mg/dL  (H)). Liver Function Tests: Recent Labs  Lab 10/13/23 0439  AST 19  ALT 13  ALKPHOS 77  BILITOT 0.9  PROT 6.2*  ALBUMIN 3.3*   No results for input(s): LIPASE, AMYLASE in the last 168 hours. No results for input(s): AMMONIA in the last 168 hours. Coagulation Profile: Recent Labs  Lab 10/15/23 0249 10/16/23 0203 10/17/23 0437 10/18/23 0318 10/19/23 0412  INR 2.0* 2.1* 2.5* 1.9* 1.7*   Cardiac Enzymes: No results for input(s): CKTOTAL, CKMB, CKMBINDEX, TROPONINI in the last 168 hours. BNP (last 3 results) No results for input(s): PROBNP in the last 8760 hours. HbA1C: No results for input(s): HGBA1C in the last 72 hours. CBG: No results for input(s): GLUCAP in the last 168 hours. Lipid Profile: No results for input(s): CHOL, HDL, LDLCALC, TRIG, CHOLHDL, LDLDIRECT in the last  72 hours. Thyroid  Function Tests: No results for input(s): TSH, T4TOTAL, FREET4, T3FREE, THYROIDAB in the last 72 hours. Anemia Panel: No results for input(s): VITAMINB12, FOLATE, FERRITIN, TIBC, IRON, RETICCTPCT in the last 72 hours.  Sepsis Labs: No results for input(s): PROCALCITON, LATICACIDVEN in the last 168 hours.  Recent Results (from the past 240 hours)  Surgical pcr screen     Status: None   Collection Time: 10/13/23  7:07 AM   Specimen: Nasal Mucosa; Nasal Swab  Result Value Ref Range Status   MRSA, PCR NEGATIVE NEGATIVE Final   Staphylococcus aureus NEGATIVE NEGATIVE Final    Comment: (NOTE) The Xpert SA Assay (FDA approved for NASAL specimens in patients 2 years of age and older), is one component of a comprehensive surveillance program. It is not intended to diagnose infection nor to guide or monitor treatment. Performed at [facility], [address], [place], [place] [address]     Radiology Studies: No results found.  Scheduled Meds:  sodium chloride    Intravenous Once   sodium chloride    Intravenous Once    anastrozole   1 mg Oral Daily   diltiazem   90 mg Oral Q12H   docusate sodium   100 mg Oral BID   feeding supplement  237 mL Oral BID BM   hydrALAZINE   10 mg Oral TID   loratadine   10 mg Oral Daily   metoprolol  succinate  25 mg Oral BID   multivitamin with minerals  1 tablet Oral Daily   pantoprazole   40 mg Oral Daily   rosuvastatin   10 mg Oral Once per day on Sunday Wednesday   sertraline   75 mg Oral QHS   warfarin  2 mg Oral ONCE-1600   Warfarin - Pharmacist Dosing Inpatient   Does not apply q1600   Continuous Infusions:     LOS: 7 days    Time spent: 35 mins    Darcel Dawley, MD [facility]   If 7PM-7AM, please contact night-coverage

## 2023-10-19 NOTE — Progress Notes (Signed)
 Physical Therapy Treatment Patient Details Name: Brandi [name] MRN: [id number] DOB: [birth year] Today's Date: 10/19/2023   History of Present Illness 83 years old female presented to the ED 9/23 with complaints of left hip pain after fall at home. X-ray of the left knee shows acute fracture of distal left femur. S/p OPEN REDUCTION INTERNAL FIXATION LEFT PERIPROSTHETIC FRACTURE 9/26. With PMH significant for history of A-fib, hyperlipidemia, essential hypertension, obesity, history of subdural hematoma, CKD stage IIIb , seizures, falls.    PT Comments  Pt resting in bed on arrival, pleasant and agreeable to session. Pt continues to be disoriented to situation needing re-direction throughout session. Pt able to complete bed mobility with mod A +2 and come to standing with up to max A  +2 as pt with limited weight bearing and standing tolerance on L due to pain. Pt requiring max A +2 to stand pivot EOB>chair with pt unable to weight shift to L to advance LEs to progress gait this session. Pt performing seated LE exercises for increased ROM and strength maintenance. Patient will benefit from continued inpatient follow up therapy, <3 hours/day, will continue to follow acutely.    If plan is discharge home, recommend the following: Two people to help with walking and/or transfers;A lot of help with bathing/dressing/bathroom;Assistance with cooking/housework;Assist for transportation;Help with stairs or ramp for entrance;Supervision due to cognitive status   Can travel by private vehicle     No  Equipment Recommendations  None recommended by PT    Recommendations for Other Services       Precautions / Restrictions Precautions Precautions: Fall Recall of Precautions/Restrictions: Impaired Precaution/Restrictions Comments: Okay to come out of the immobilizer at rest for knee motion as tolerated Required Braces or Orthoses: Knee Immobilizer - Left Knee Immobilizer - Left: On when out of bed or  walking Restrictions Weight Bearing Restrictions Per Provider Order: Yes LLE Weight Bearing Per Provider Order: Weight bearing as tolerated Other Position/Activity Restrictions: Okay to come out of the immobilizer at rest for knee motion as tolerated     Mobility  Bed Mobility Overal bed mobility: Needs Assistance Bed Mobility: Supine to Sit     Supine to sit: Mod assist, +2 for physical assistance     General bed mobility comments: cues for sequencing to come to R EOB, mod A +2 to manage LEs and elevate trunk to sitting    Transfers Overall transfer level: Needs assistance Equipment used: Rolling walker (2 wheels) Transfers: Sit to/from Stand, Bed to chair/wheelchair/BSC Sit to Stand: Max assist, +2 physical assistance, Mod assist Stand pivot transfers: Max assist, +2 physical assistance         General transfer comment: light mod A +2 to stand to RW on initialy stand, able to come to upright standing, pt with crouched posture on second stand unable to elevate trunk, limited weight bearing through LLE, max A +2 to stand with HHA and pivot to chair    Ambulation/Gait               General Gait Details: unable to safely perform yet.   Stairs             Wheelchair Mobility     Tilt Bed    Modified Rankin (Stroke Patients Only)       Balance Overall balance assessment: Needs assistance Sitting-balance support: Single extremity supported, Feet supported Sitting balance-Leahy Scale: Poor Sitting balance - Comments: right lean in sitting requiring min assist at times to correct Postural control:  Right lateral lean Standing balance support: Bilateral upper extremity supported, Reliant on assistive device for balance Standing balance-Leahy Scale: Poor Standing balance comment: heavy reliance on RW and external assist                            Communication Communication Communication: Impaired Factors Affecting Communication: Reduced  clarity of speech  Cognition Arousal: Alert Behavior During Therapy: WFL for tasks assessed/performed   PT - Cognitive impairments: No family/caregiver present to determine baseline, Orientation, Awareness, Memory, Attention, Problem solving, Sequencing, Safety/Judgement   Orientation impairments: Time, Situation                     Following commands: Impaired Following commands impaired: Follows one step commands with increased time    Cueing Cueing Techniques: Verbal cues, Tactile cues  Exercises General Exercises - Lower Extremity Ankle Circles/Pumps: 10 reps, Seated, AROM, Left Quad Sets: AROM, Left, 5 reps, Seated Heel Slides: AROM, Left, 5 reps, Seated    General Comments General comments (skin integrity, edema, etc.): HR up to 139bpm, 112bpm once seated in chair      Pertinent Vitals/Pain Pain Assessment Pain Assessment: Faces Faces Pain Scale: Hurts little more Pain Location: LLE Pain Descriptors / Indicators: Grimacing, Guarding Pain Intervention(s): Monitored during session, Limited activity within patient's tolerance    Home Living                          Prior Function            PT Goals (current goals can now be found in the care plan section) Acute Rehab PT Goals Patient Stated Goal: none stated, confused PT Goal Formulation: Patient unable to participate in goal setting Time For Goal Achievement: 10/29/23 Progress towards PT goals: Progressing toward goals    Frequency    Min 2X/week      PT Plan      Co-evaluation              AM-PAC PT 6 Clicks Mobility   Outcome Measure  Help needed turning from your back to your side while in a flat bed without using bedrails?: A Lot Help needed moving from lying on your back to sitting on the side of a flat bed without using bedrails?: A Lot Help needed moving to and from a bed to a chair (including a wheelchair)?: A Lot Help needed standing up from a chair using your arms  (e.g., wheelchair or bedside chair)?: A Lot Help needed to walk in hospital room?: Total Help needed climbing 3-5 steps with a railing? : Total 6 Click Score: 10    End of Session Equipment Utilized During Treatment: Gait belt;Left knee immobilizer Activity Tolerance: Patient limited by pain (weakness) Patient left: in chair;with call bell/phone within reach;with chair alarm set (with lift pad placed under pt) Nurse Communication: Mobility status;Need for lift equipment (+2 assist for pivot transfer) PT Visit Diagnosis: Unsteadiness on feet (R26.81);Other abnormalities of gait and mobility (R26.89);Muscle weakness (generalized) (M62.81);History of falling (Z91.81);Difficulty in walking, not elsewhere classified (R26.2);Pain Pain - Right/Left: Left Pain - part of body: Leg     Time: 8973-8950 PT Time Calculation (min) (ACUTE ONLY): 23 min  Charges:    $Therapeutic Exercise: 8-22 mins $Therapeutic Activity: 8-22 mins PT General Charges $$ ACUTE PT VISIT: 1 Visit  Therisa SAUNDERS. PTA Acute Rehabilitation Services Office: [phone]   Therisa CHRISTELLA Boor 10/19/2023, 10:57 AM

## 2023-10-20 DIAGNOSIS — S72001A Fracture of unspecified part of neck of right femur, initial encounter for closed fracture: Secondary | ICD-10-CM

## 2023-10-20 DIAGNOSIS — S3282XA Multiple fractures of pelvis without disruption of pelvic ring, initial encounter for closed fracture: Secondary | ICD-10-CM

## 2023-10-20 LAB — PROTIME-INR
INR: 1.5 — ABNORMAL HIGH (ref 0.8–1.2)
Prothrombin Time: 19.3 s — ABNORMAL HIGH (ref 11.4–15.2)

## 2023-10-20 MED ORDER — WARFARIN SODIUM 3 MG PO TABS: 3.0000 mg | ORAL_TABLET | Freq: Every day | ORAL | 0 refills | Status: DC

## 2023-10-20 MED ORDER — METOPROLOL SUCCINATE ER 50 MG PO TB24: 50.0000 mg | ORAL_TABLET | Freq: Two times a day (BID) | ORAL | Status: DC

## 2023-10-20 MED ORDER — DILTIAZEM HCL ER 90 MG PO CP12: 90.0000 mg | ORAL_CAPSULE | Freq: Two times a day (BID) | ORAL | 0 refills | Status: AC

## 2023-10-20 MED ORDER — METOPROLOL SUCCINATE ER 25 MG PO TB24
25.0000 mg | ORAL_TABLET | Freq: Once | ORAL | Status: AC
  Administered 2023-10-20: 25 mg via ORAL
  Filled 2023-10-20: qty 1

## 2023-10-20 MED ORDER — ROSUVASTATIN CALCIUM 10 MG PO TABS: 10.0000 mg | ORAL_TABLET | ORAL | Status: AC

## 2023-10-20 MED ORDER — WARFARIN SODIUM 3 MG PO TABS
3.0000 mg | ORAL_TABLET | Freq: Once | ORAL | Status: AC
  Administered 2023-10-20: 3 mg via ORAL
  Filled 2023-10-20: qty 1

## 2023-10-20 MED ORDER — DOCUSATE SODIUM 100 MG PO CAPS: 100.0000 mg | ORAL_CAPSULE | Freq: Two times a day (BID) | ORAL | 0 refills | Status: DC | PRN

## 2023-10-20 MED ORDER — METOPROLOL SUCCINATE ER 50 MG PO TB24: 50.0000 mg | ORAL_TABLET | Freq: Two times a day (BID) | ORAL | 0 refills | Status: DC

## 2023-10-20 NOTE — TOC Progression Note (Addendum)
 Transition of Care [facility]) - Progression Note    Patient Details  Name: Neomia Herbel MRN: [id number] Date of Birth: November 02, 1940  Transition of Care [facility]) CM/SW Contact  Bridget Cordella Simmonds, LCSW Phone Number: 10/20/2023, 8:40 AM  Clinical Narrative:   SNF auth approved: [id number], 3 days: 9/30-10/2.  CSW confirmed with [facility] that they can receive pt today.  MD informed.   Expected Discharge Plan: Skilled Nursing Facility Barriers to Discharge: Continued Medical Work up               Expected Discharge Plan and Services   Discharge Planning Services: CM Consult   Living arrangements for the past 2 months: Single Family Home                                       Social Drivers of Health (SDOH) Interventions SDOH Screenings   Food Insecurity: Patient Unable To Answer (10/13/2023)  Housing: Patient Unable To Answer (10/13/2023)  Transportation Needs: Unknown (10/13/2023)  Utilities: Patient Unable To Answer (10/13/2023)  Depression (PHQ2-9): Low Risk  (09/14/2023)  Social Connections: Patient Unable To Answer (10/13/2023)  Tobacco Use: Low Risk  (10/15/2023)    Readmission Risk Interventions    07/30/2023   11:14 AM 11/12/2022    3:06 PM  Readmission Risk Prevention Plan  Transportation Screening Complete Complete  PCP or Specialist Appt within 3-5 Days  Complete  HRI or Home Care Consult Complete Complete  Social Work Consult for Recovery Care Planning/Counseling Complete   Palliative Care Screening Not Applicable Complete  Medication Review Oceanographer) Complete Complete

## 2023-10-20 NOTE — Discharge Summary (Signed)
 Physician Discharge Summary  Britney Newstrom FMW:[id number] DOB: [birth year] DOA: 10/12/2023  PCP: Addie Camellia CROME, MD  Admit date: 10/12/2023 Discharge date: 10/20/2023  Admitted From: Home Disposition: SNF  Recommendations for Outpatient Follow-up:  Follow up with PCP in 1 week with repeat CBC/BMP Follow-up with orthopedics in 2 weeks Follow up in ED if symptoms worsen or new appear  Discharge Condition: Stable CODE STATUS: Full Diet recommendation: Heart healthy  Brief/Interim Summary:        Brief Narrative:  This 83 years old female with PMH significant for history of A-fib, hyperlipidemia, essential hypertension, obesity, history of subdural hematoma, 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  X-ray of the left hip negative for acute fracture or dislocation.  X-ray of the left knee shows acute fracture of distal left femur, patient admitted for further management.  Details of hospital course as below.    Assessment & Plan:   Principal Problem:   Fall at home, initial encounter   Acute fracture of left distal femur: Status post fall: Patient presented status post mechanical fall at home. X-ray left knee showed acute fracture of distal left femur. Patient is status post ORIF on 10/15/23. Resumed Coumadin  postoperatively. PT and OT recommended SNF. WBAT.  Pain control is adequate. Ortho signed off.  Follow-up with Dr. Kendal in 2 weeks for wound check and repeat x-ray.   Paroxysmal A-fib with RVR: Patient was in A-fib RVR and requiring IV Cardizem  drip.  She continues to be in a flutter with borderline heart rate.  Cardizem  increased to 90 mg every 12 hours from home dose of 60 mg Q12hr, also metoprolol  will be increased to 50 mg twice daily from 25 mg twice daily for better rate control.   INR was reversed with the procedure and Coumadin  has been resumed since  then. On discharge INR is 1.5. Recommend slightly higher dose of Coumadin  3 mg daily for now and follow-up outpatient.  acute metabolic encephalopathy likely due to polypharmacy. Patient has sudden change in the mental status. CT head rule out acute CVA. Mental status back to baseline.   CKD stage IIIb: Serum creatinine is at baseline. Avoid nephrotoxic medications.   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   Continue seizure precautions, fall precautions, aspiration precautions, no AED on medication list.   Essential hypertension: Cardizem  and metoprolol  increased as above.  Continue Aldactone  25 mg daily.   Normochromic normocytic anemia: Likely postoperative.  Hemoglobin dropped from 8.1->7.4> 7.0.  Improved with 1 unit PRBC, hemoglobin 10.3 prior to discharge.     DVT prophylaxis: Coumadin  Code Status:full code Family Communication: No family at bed side. Disposition Plan:    Status is: Inpatient Remains inpatient appropriate because:  Patient is status post ORIF. SNF planned    Consultants:  Cardiology Orthopedics      Discharge Diagnoses:  Principal Problem:   Fall at home, initial encounter Active Problems:   Closed traumatic fractures of multiple bones of right hip and pelvis, initial encounter (HCC)   Acute metabolic encephalopathy   Generalized weakness   Long term (current) use of anticoagulants   Atrial flutter (HCC)   Paroxysmal atrial fibrillation (HCC)   Essential hypertension   Malignant neoplasm of overlapping sites of right breast in female, estrogen receptor positive [facility])    Discharge Instructions  Discharge Instructions     Call MD for:  redness, tenderness, or signs of infection (pain, swelling, redness, odor or green/yellow discharge around incision site)   Complete by: As directed    Call MD for:  severe uncontrolled pain   Complete by: As directed    Call MD for:  temperature >100.4   Complete by: As  directed    Diet - low sodium heart healthy   Complete by: As directed    Discharge instructions   Complete by: As directed    1. Increase dose of cardizem  to 90mg  twice daily and Toprol  XL to 50mg  twice daily. 2. Take coumadin  3mg  q1600hrs and follow up in the coumadin  clinic or PCP for INR checks and dose adjustment   Leave dressing on - Keep it clean, dry, and intact until clinic visit   Complete by: As directed       Allergies as of 10/20/2023       Reactions   Amoxil [amoxicillin] Diarrhea   Celebrex [celecoxib] Other (See Comments)   Bruising and stomach pain   Keppra  [levetiracetam ] Other (See Comments)   Somnolence        Medication List     PAUSE taking these medications    warfarin 2 MG tablet Wait to take this until your doctor or other care provider tells you to start again. Commonly known as: Jantoven  Take as directed. If you are unsure how to take this medication, talk to your nurse or doctor. Original instructions: TAKE 1/2 TO 1 TABLET (1 TO 2MG ) DAILY OR AS DIRECTED  BY ANTICOAGULATION CLINIC You also have another medication with the same name that you may need to continue taking. What changed:  how much to take how to take this when to take this additional instructions       TAKE these medications    acetaminophen  500 MG tablet Commonly known as: TYLENOL  Take 1,000 mg by mouth every 6 (six) hours as needed for mild pain.   anastrozole  1 MG tablet Commonly known as: ARIMIDEX  Take 1 tablet (1 mg total) by mouth daily.   Aspercreme Lidocaine  4 % Liqd Generic drug: Lidocaine  HCl Apply 1 Application topically 2 (two) times daily as needed (knee pain).   azelastine  0.1 % nasal spray Commonly known as: ASTELIN  Place 2 sprays into both nostrils daily as needed for rhinitis or allergies.   Centrum Silver 50+Women Tabs Take 1 tablet by mouth every evening.   cetirizine 10 MG tablet Commonly known as: ZYRTEC Take 10 mg by mouth daily as needed  for allergies.   diltiazem  90 MG 12 hr capsule Commonly known as: CARDIZEM  SR Take 1 capsule (90 mg total) by mouth every 12 (twelve) hours. What changed:  medication strength how much to take   docusate sodium  100 MG capsule Commonly known as: COLACE Take 1 capsule (100 mg total) by mouth 2 (two) times daily as needed for mild constipation.   ferrous sulfate  325 (65 FE) MG tablet Take 325 mg by mouth daily with breakfast.   guanFACINE  2 MG tablet Commonly known as: TENEX  Take 2 mg by mouth every evening.   HYDROcodone -acetaminophen  5-325 MG tablet Commonly known as: NORCO/VICODIN Take 1-2 tablets by mouth every 6 (six) hours as needed (1 tablet pain score 4-6, 2 tablets pain score 7-10).   methocarbamol  500 MG tablet Commonly known as: ROBAXIN  Take 1 tablet (500 mg total) by mouth every 6 (six) hours as needed for muscle spasms.   metoprolol  succinate 50 MG 24 hr tablet Commonly known as: TOPROL -XL Take  1 tablet (50 mg total) by mouth 2 (two) times daily. Take with or immediately following a meal. What changed:  medication strength how much to take additional instructions   pantoprazole  40 MG tablet Commonly known as: PROTONIX  Take 1 tablet (40 mg total) by mouth daily. What changed:  when to take this reasons to take this   polyethylene glycol 17 g packet Commonly known as: MIRALAX  / GLYCOLAX  Take 17 g by mouth daily. What changed:  when to take this reasons to take this   rosuvastatin  10 MG tablet Commonly known as: CRESTOR  Take 1 tablet (10 mg total) by mouth See admin instructions. Take 10 mg by mouth at bedtime on Sun/Wed only (twice a week)   sertraline  50 MG tablet Commonly known as: ZOLOFT  Take 50 mg by mouth at bedtime.   spironolactone  25 MG tablet Commonly known as: ALDACTONE  Take 25 mg by mouth every evening.   Systane Complete PF 0.6 % Soln Generic drug: Propylene Glycol (PF) Place 1 drop into both eyes as needed (Dry eye).   warfarin 3  MG tablet Commonly known as: Coumadin  Take as directed. If you are unsure how to take this medication, talk to your nurse or doctor. Original instructions: Take 1 tablet (3 mg total) by mouth daily at 4 PM. Follow up with coumadin  clinic for INR checks/dose adjustment What changed: Another medication with the same name was paused. Ask your nurse or doctor if you should take this medication.               Discharge Care Instructions  (From admission, onward)           Start     Ordered   10/20/23 0000  Leave dressing on - Keep it clean, dry, and intact until clinic visit        10/20/23 1108            Follow-up Information     Gollan, Evalene PARAS, MD. Schedule an appointment as soon as possible for a visit in 2 week(s).   Specialty: Cardiology Contact information: [address] [place] [address] [phone]         Kendal Franky SQUIBB, MD Follow up in 2 week(s).   Specialty: Orthopedic Surgery Contact information: [address] [place] [address] [phone]                Allergies  Allergen Reactions   Amoxil [Amoxicillin] Diarrhea   Celebrex [Celecoxib] Other (See Comments)    Bruising and stomach pain   Keppra  [Levetiracetam ] Other (See Comments)    Somnolence    Consultations:    Procedures/Studies: DG FEMUR PORT MIN 2 VIEWS LEFT Result Date: 10/15/2023 CLINICAL DATA:  Post internal fixation of periprosthetic fracture to the distal femur. EXAM: LEFT FEMUR PORTABLE 2 VIEWS COMPARISON:  Left knee 10/12/2023 FINDINGS: Postoperative changes with long intramedullary rod, femoral neck compression screws, and lateral plate and screw fixation of the femur. A left total knee arthroplasty is present. Distal left metaphyseal fracture is again demonstrated. Near anatomic position of the fracture fragments is suggested. IMPRESSION: Interval placement of long lateral plate and screw fixation of the femoral shaft to the knee. Distal  metaphyseal fracture fragments appear in near anatomic position. Old intramedullary rod, femoral neck compression bold, and left knee arthroplasty hardware remains unchanged. The Electronically Signed   By: Elsie Gravely M.D.   On: 10/15/2023 16:46   DG FEMUR MIN 2 VIEWS LEFT Result Date: 10/15/2023  CLINICAL DATA:  Open reduction internal fixation of periprosthetic distal femur fracture EXAM: LEFT FEMUR 2 VIEWS COMPARISON:  Left knee radiograph dated 10/12/2023 FINDINGS: Seven fluoroscopic images obtained during open reduction internal fixation of periprosthetic fracture. 69.2 seconds fluoro time utilized. Radiation dose 3.9 mGy Kerma. Please see performing physicians operative report for full details. IMPRESSION: Fluoroscopic images were obtained for intraoperative guidance of open reduction internal fixation of periprosthetic distal femur fracture. Electronically Signed   By: Limin  Xu M.D.   On: 10/15/2023 15:38   DG C-Arm 1-60 Min-No Report Result Date: 10/15/2023 Fluoroscopy was utilized by the requesting physician.  No radiographic interpretation.   CT HEAD WO CONTRAST ( ) Result Date: 10/13/2023 EXAM: CT HEAD WITHOUT CONTRAST 10/13/2023 10:09:44 AM TECHNIQUE: CT of the head was performed without the administration of intravenous contrast. Automated exposure control, iterative reconstruction, and/or weight based adjustment of the mA/kV was utilized to reduce the radiation dose to as low as reasonably achievable. COMPARISON: CT head without contrast dated 05/07/2023. CLINICAL HISTORY: Mental status change, unknown cause. ams. FINDINGS: BRAIN AND VENTRICLES: No acute hemorrhage. No evidence of acute infarct. No hydrocephalus. No extra-axial collection. No mass effect or midline shift. Moderate atrophy and white matter changes are stable. 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 Atherosclerotic calcifications are present in the cavernous carotid  arteries bilaterally and at the dural margin of both vertebral arteries. No hyperdense vessel is present. ORBITS: Bilateral lens replacements are noted. The globes and orbits are otherwise within normal limits. SINUSES: Chronic opacification of the right maxillary sinus is again noted. Anterior right ethmoid air cells are chronically opacified. Calcifications are present within nasopacified sinuses. SOFT TISSUES AND SKULL: No acute soft tissue abnormality. No skull fracture. IMPRESSION: 1. No acute intracranial abnormality. 2. Stable moderate atrophy and white matter changes. 3. 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 4. Chronic opacification of the right maxillary sinus and anterior right ethmoid air cells with calcifications. Electronically signed by: Lonni Necessary MD 10/13/2023 10:16 AM EDT RP Workstation: [id number]   DG Hip Unilat W or Wo Pelvis 2-3 Views Left Result Date: 10/12/2023 CLINICAL DATA:  Status post fall. EXAM: DG HIP (WITH OR WITHOUT PELVIS) 2-3V LEFT COMPARISON:  November 11, 2022 FINDINGS: There is no evidence of an acute hip fracture or dislocation. A radiopaque intramedullary rod and compression screw device are seen within the proximal left femur. A chronic proximal left femoral fracture deformity is noted, with additional chronic fractures of the right superior and right inferior pubic rami. Stable degenerative changes are seen in the form of joint space narrowing and acetabular sclerosis. IMPRESSION: 1. No acute fracture or dislocation. 2. Chronic fractures of the proximal left femur and right superior and right inferior pubic rami. Electronically Signed   By: Suzen Dials M.D.   On: 10/12/2023 14:42   DG Knee Complete 4 Views Left Result Date: 10/12/2023 CLINICAL DATA:  Status post fall. 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 An acute, mildly displaced fracture is seen  extending through the supracondylar regions of the distal left femur. This extends to the level of the femoral prosthesis. A radiopaque intramedullary rod and distal fixation screw are seen within the visualized left femoral shaft. There is no evidence of dislocation. A large joint effusion is noted. IMPRESSION: 1. Acute fracture of the distal left femur. 2. Left knee replacement.  3. Large joint effusion. Electronically Signed   By: Suzen Dials M.D.   On: 10/12/2023 14:38   DG Ankle Complete Left Result Date: 10/12/2023 CLINICAL DATA:  Left ankle pain after falling EXAM: LEFT ANKLE COMPLETE - 3 VIEW COMPARISON:  None Available. FINDINGS: Surgical changes of prior ORIF of a distal tibia fracture with 3 orthopedic pins in place. No evidence of acute fracture. Residual posttraumatic deformity present of the distal tibia and fibula. Osseous fusion of the syndesmotic ligament. 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 Are mild atherosclerotic calcifications in the posterior tibial artery. IMPRESSION: 1. No acute fracture or malalignment. 2. Evidence of remote prior distal tibia and fibular fracture status post ORIF of the distal tibial with ankylosis of the syndesmosis. 3. 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 Electronically Signed   By: Wilkie Lent M.D.   On: 10/12/2023 14:36      Subjective:   Discharge Exam: Vitals:   10/20/23 0440 10/20/23 0828  BP: 134/84 127/73  Pulse: 89 100  Resp: 18 16  Temp: 97.8 F (36.6 C) 97.7 F (36.5 C)  SpO2: 96% 95%    General: Pt is alert, awake, not in acute distress Cardiovascular: rate controlled, irregular, S1/S2 + Respiratory: bilateral decreased breath sounds at bases Abdominal: Soft, NT, ND, bowel sounds + Extremities: Left lower extremity, mild edema, wrapped with bandage.    The results of significant diagnostics from this hospitalization (including imaging, microbiology, ancillary  and laboratory) are listed below for reference.     Microbiology: Recent Results (from the past 240 hours)  Surgical pcr screen     Status: None   Collection Time: 10/13/23  7:07 AM   Specimen: Nasal Mucosa; Nasal Swab  Result Value Ref Range Status   MRSA, PCR NEGATIVE NEGATIVE Final   Staphylococcus aureus NEGATIVE NEGATIVE Final    Comment: (NOTE) The Xpert SA Assay (FDA approved for NASAL specimens in patients 56 years of age and older), is one component of a comprehensive surveillance program. It is not intended to diagnose infection nor to guide or monitor treatment. Performed at [facility], [address], [place], [place] [address]      Labs: BNP (last 3 results) No results for input(s): BNP in the last 8760 hours. Basic Metabolic Panel: Recent Labs  Lab 10/15/23 0249 10/16/23 0203 10/17/23 0437 10/18/23 0318  NA 130* 132* 132* 133*  K 4.2 4.5 4.2 4.1  CL 97* 96* 98 100  CO2 22 21* 24 23  GLUCOSE 112* 132* 98 98  BUN 34* 32* 43* 43*  CREATININE 1.67* 1.48* 1.72* 1.30*  CALCIUM  8.7* 8.7* 8.7* 8.8*  MG  --  1.9  --   --   PHOS  --  3.7  --   --    Liver Function Tests: No results for input(s): AST, ALT, ALKPHOS, BILITOT, PROT, ALBUMIN in the last 168 hours. No results for input(s): LIPASE, AMYLASE in the last 168 hours. No results for input(s): AMMONIA in the last 168 hours. CBC: Recent Labs  Lab 10/15/23 0249 10/16/23 0203 10/17/23 0437 10/17/23 1855 10/18/23 0318  WBC 11.7* 10.2 11.5*  --  12.7*  HGB 8.1* 7.4* 7.0* 10.0* 10.3*  HCT 25.0* 22.5* 22.0* 31.1* 31.1*  MCV 96.5 95.7 98.2  --  92.6  PLT 188 199 280  --  316   Cardiac Enzymes: No results for input(s): CKTOTAL, CKMB, CKMBINDEX, TROPONINI in the last 168 hours. BNP: Invalid input(s): POCBNP CBG: No results  for input(s): GLUCAP in the last 168 hours. D-Dimer No results for input(s): DDIMER in the last 72 hours. Hgb A1c No results for input(s):  HGBA1C in the last 72 hours. Lipid Profile No results for input(s): CHOL, HDL, LDLCALC, TRIG, CHOLHDL, LDLDIRECT in the last 72 hours. Thyroid  function studies No results for input(s): TSH, T4TOTAL, T3FREE, THYROIDAB in the last 72 hours.  Invalid input(s): FREET3 Anemia work up No results for input(s): VITAMINB12, FOLATE, FERRITIN, TIBC, IRON, RETICCTPCT in the last 72 hours. Urinalysis    Component Value Date/Time   COLORURINE YELLOW 07/29/2023 0610   APPEARANCEUR HAZY (A) 07/29/2023 0610   LABSPEC 1.006 07/29/2023 0610   PHURINE 6.0 07/29/2023 0610   GLUCOSEU NEGATIVE 07/29/2023 0610   HGBUR NEGATIVE 07/29/2023 0610   BILIRUBINUR NEGATIVE 07/29/2023 0610   KETONESUR NEGATIVE 07/29/2023 0610   PROTEINUR NEGATIVE 07/29/2023 0610   NITRITE POSITIVE (A) 07/29/2023 0610   LEUKOCYTESUR MODERATE (A) 07/29/2023 0610   Sepsis Labs Recent Labs  Lab 10/15/23 0249 10/16/23 0203 10/17/23 0437 10/18/23 0318  WBC 11.7* 10.2 11.5* 12.7*   Microbiology Recent Results (from the past 240 hours)  Surgical pcr screen     Status: None   Collection Time: 10/13/23  7:07 AM   Specimen: Nasal Mucosa; Nasal Swab  Result Value Ref Range Status   MRSA, PCR NEGATIVE NEGATIVE Final   Staphylococcus aureus NEGATIVE NEGATIVE Final    Comment: (NOTE) The Xpert SA Assay (FDA approved for NASAL specimens in patients 73 years of age and older), is one component of a comprehensive surveillance program. It is not intended to diagnose infection nor to guide or monitor treatment. Performed at [facility], [address], [place], [place] [address]      Time coordinating discharge: 35 minutes  SIGNED:   Derryl Duval, MD  [facility] 10/20/2023, 12:39 PM

## 2023-10-20 NOTE — TOC Transition Note (Signed)
 Transition of Care [facility]) - Discharge Note   Patient Details  Name: Brandi [name] MRN: [id number] Date of Birth: 10/08/1940  Transition of Care [facility]) CM/SW Contact:  Bridget Cordella Simmonds, LCSW Phone Number: 10/20/2023, 2:27 PM   Clinical Narrative:   Pt discharging to [facility], room 202A.  RN call report to [phone].  PTAR called [phone].     Final next level of care: Skilled Nursing Facility Barriers to Discharge: Barriers Resolved   Patient Goals and CMS Choice     Choice offered to / list presented to : Spouse (left @ bedside for husband)      Discharge Placement              Patient chooses bed at: [facility] Patient to be transferred to facility by: ptar Name of family member notified: husband [name] Patient and family notified of of transfer: 10/20/23  Discharge Plan and Services Additional resources added to the After Visit Summary for     Discharge Planning Services: CM Consult                                 Social Drivers of Health (SDOH) Interventions SDOH Screenings   Food Insecurity: Patient Unable To Answer (10/13/2023)  Housing: Patient Unable To Answer (10/13/2023)  Transportation Needs: Unknown (10/13/2023)  Utilities: Patient Unable To Answer (10/13/2023)  Depression (PHQ2-9): Low Risk  (09/14/2023)  Social Connections: Patient Unable To Answer (10/13/2023)  Tobacco Use: Low Risk  (10/15/2023)     Readmission Risk Interventions    07/30/2023   11:14 AM 11/12/2022    3:06 PM  Readmission Risk Prevention Plan  Transportation Screening Complete Complete  PCP or Specialist Appt within 3-5 Days  Complete  HRI or Home Care Consult Complete Complete  Social Work Consult for Recovery Care Planning/Counseling Complete   Palliative Care Screening Not Applicable Complete  Medication Review Oceanographer) Complete Complete

## 2023-10-20 NOTE — Progress Notes (Signed)
 Occupational Therapy Treatment Patient Details Name: Brandi [name] MRN: [id number] DOB: [birth year] Today's Date: 10/20/2023   History of present illness 83 years old female presented to the ED 9/23 with complaints of left hip pain after fall at home. X-ray of the left knee shows acute fracture of distal left femur. S/p OPEN REDUCTION INTERNAL FIXATION LEFT PERIPROSTHETIC FRACTURE 9/26. With PMH significant for history of A-fib, hyperlipidemia, essential hypertension, obesity, history of subdural hematoma, CKD stage IIIb , seizures, falls.   OT comments  Pt making slow progress with functional goals, is pleasant and cooperative. Max A to sit EOB, min A to dress UB, CGA for grooming/hygiene seated. Max A +2/total A STS and SPT to chair. OT will continue to follow acutely to maximize level of function and safety      If plan is discharge home, recommend the following:  Two people to help with walking and/or transfers;Two people to help with bathing/dressing/bathroom;Assist for transportation;Help with stairs or ramp for entrance;Supervision due to cognitive status   Equipment Recommendations  Other (comment) (defer)    Recommendations for Other Services      Precautions / Restrictions Precautions Precautions: Fall Recall of Precautions/Restrictions: Impaired Precaution/Restrictions Comments: Okay to come out of the immobilizer at rest for knee motion as tolerated Required Braces or Orthoses: Knee Immobilizer - Left Knee Immobilizer - Left: On when out of bed or walking Restrictions Weight Bearing Restrictions Per Provider Order: Yes RLE Weight Bearing Per Provider Order: Weight bearing as tolerated LLE Weight Bearing Per Provider Order: Weight bearing as tolerated Other Position/Activity Restrictions: Okay to come out of the immobilizer at rest for knee motion as tolerated       Mobility Bed Mobility Overal bed mobility: Needs Assistance Bed Mobility: Supine to Sit      Supine to sit: Max assist     General bed mobility comments: cues for sequencing to come to R EOB, max A to manage LEs and elevate trunk to sitting    Transfers Overall transfer level: Needs assistance Equipment used: Rolling walker (2 wheels) Transfers: Sit to/from Stand, Bed to chair/wheelchair/BSC Sit to Stand: Mod assist Stand pivot transfers: Max assist, +2 physical assistance               Balance   Sitting-balance support: Single extremity supported, Feet supported Sitting balance-Leahy Scale: Fair     Standing balance support: Bilateral upper extremity supported, Reliant on assistive device for balance Standing balance-Leahy Scale: Poor                             ADL either performed or assessed with clinical judgement   ADL Overall ADL's : Needs assistance/impaired     Grooming: Wash/dry hands;Wash/dry face;Brushing hair;Contact guard assist           Upper Body Dressing : Minimal assistance;Sitting       Toilet Transfer: Stand-pivot;BSC/3in1;Total assistance   Toileting- Clothing Manipulation and Hygiene: Total assistance       Functional mobility during ADLs: Total assistance      Extremity/Trunk Assessment Upper Extremity Assessment Upper Extremity Assessment: Generalized weakness   Lower Extremity Assessment Lower Extremity Assessment: Defer to PT evaluation   Cervical / Trunk Assessment Cervical / Trunk Assessment: Kyphotic    Vision Baseline Vision/History: 1 Wears glasses Ability to See in Adequate Light: 0 Adequate Patient Visual Report: No change from baseline     Perception     Praxis  Communication Communication Communication: Impaired   Cognition Arousal: Alert Behavior During Therapy: WFL for tasks assessed/performed Cognition: No family/caregiver present to determine baseline, History of cognitive impairments                               Following commands: Impaired Following  commands impaired: Follows one step commands with increased time      Cueing   Cueing Techniques: Verbal cues, Tactile cues  Exercises      Shoulder Instructions       General Comments      Pertinent Vitals/ Pain       Pain Assessment Pain Assessment: Faces Faces Pain Scale: Hurts little more Pain Location: LLE Pain Descriptors / Indicators: Grimacing, Guarding Pain Intervention(s): Limited activity within patient's tolerance, Monitored during session, Repositioned, Premedicated before session  Home Living                                          Prior Functioning/Environment              Frequency  Min 1X/week        Progress Toward Goals  OT Goals(current goals can now be found in the care plan section)  Progress towards OT goals: OT to reassess next treatment     Plan      Co-evaluation                 AM-PAC OT 6 Clicks Daily Activity     Outcome Measure   Help from another person eating meals?: None Help from another person taking care of personal grooming?: A Little Help from another person toileting, which includes using toliet, bedpan, or urinal?: Total Help from another person bathing (including washing, rinsing, drying)?: Total   Help from another person to put on and taking off regular lower body clothing?: Total 6 Click Score: 10    End of Session Equipment Utilized During Treatment: Gait belt;Rolling walker (2 wheels)  OT Visit Diagnosis: Unsteadiness on feet (R26.81);Other abnormalities of gait and mobility (R26.89);Repeated falls (R29.6);Muscle weakness (generalized) (M62.81);Other symptoms and signs involving cognitive function;Pain Pain - Right/Left: Left Pain - part of body: Hip   Activity Tolerance Patient limited by pain   Patient Left in chair;with call bell/phone within reach;with chair alarm set   Nurse Communication Need for lift equipment;Mobility status        Time: 9057-8992 OT Time  Calculation (min): 25 min  Charges: OT General Charges $OT Visit: 1 Visit OT Treatments $Self Care/Home Management : 8-22 mins $Neuromuscular Re-education: 8-22 mins    Jacques Karna Loose 10/20/2023, 12:13 PM

## 2023-10-20 NOTE — Progress Notes (Signed)
 PHARMACY - ANTICOAGULATION CONSULT NOTE  Pharmacy Consult for warfarin Indication: atrial fibrillation  Allergies  Allergen Reactions   Amoxil [Amoxicillin] Diarrhea   Celebrex [Celecoxib] Other (See Comments)    Bruising and stomach pain   Keppra  [Levetiracetam ] Other (See Comments)    Somnolence    Patient Measurements: Height: 4' 11 (149.9 cm) Weight: 54.2 kg (119 lb 7.8 oz) IBW/kg (Calculated) : 43.2 HEPARIN DW (KG): 54.1  Vital Signs: Temp: 97.8 F (36.6 C) (10/01 0440) Temp Source: Oral (09/30 2202) BP: 134/84 (10/01 0440) Pulse Rate: 89 (10/01 0440)  Labs: Recent Labs    10/17/23 1855 10/18/23 0318 10/19/23 0412 10/20/23 0405  HGB 10.0* 10.3*  --   --   HCT 31.1* 31.1*  --   --   PLT  --  316  --   --   LABPROT  --  23.0* 20.6* 19.3*  INR  --  1.9* 1.7* 1.5*  CREATININE  --  1.30*  --   --     Estimated Creatinine Clearance: 24.6 mL/min (A) (by C-G formula based on SCr of 1.3 mg/dL (H)).   Medical History: Past Medical History:  Diagnosis Date   Allergy    Atrial fibrillation (HCC)    Dysrhythmia    Hyperlipidemia    Hypertension    Neuromuscular disorder (HCC)    Venous insufficiency       Assessment: 53 yoF admitted with fall and AF. Pt on warfarin PTA which was held for ORIF 9/26. Pharmacy to resume warfarin 9/27 with no bridge.  Per husband (10/16/23), PTA dose 2mg  TTSat, 1mg  MWFSun (which also aligns with 10/06/23 anti-coag visit note).   10/1 AM update: INR 1.5 Patient charted as eating ~25% of meals offered on 9/30. This is down from what was charted on 9/29 (75%)  Goal of Therapy:  INR 2-3 Monitor platelets by anticoagulation protocol: Yes   Plan:  Warfarin 3 mg x1 tonight Daily INR  Jamiyah Dingley BS, PharmD, BCPS Clinical Pharmacist 10/20/2023 8:02 AM  Contact: [phone] after 3 PM

## 2023-10-20 NOTE — Plan of Care (Signed)

## 2023-11-10 ENCOUNTER — Telehealth: Payer: Self-pay

## 2023-11-10 NOTE — Telephone Encounter (Signed)
 Pt's SNF called stating that the pt has shingles dx on 11/10/2023 and would need to reschedule her appt on 11/11/2023.  Transferred SNF nurse to Dr. Demetra scheduler to have the appts rescheduled.

## 2023-11-17 ENCOUNTER — Encounter: Payer: Self-pay | Admitting: *Deleted

## 2023-11-30 ENCOUNTER — Encounter: Payer: Self-pay | Admitting: *Deleted

## 2023-12-09 ENCOUNTER — Inpatient Hospital Stay

## 2023-12-09 ENCOUNTER — Inpatient Hospital Stay: Admitting: Hematology

## 2023-12-19 NOTE — Progress Notes (Deleted)
 [facility], [facility]   Telephone:(336) [phone] Fax:(336) [phone]    Patient Care Team: Addie Camellia CROME, MD as PCP - General (Internal Medicine) Perla Evalene PARAS, MD as PCP - Cardiology (Cardiology) Tyree Nanetta SAILOR, RN as Oncology Nurse Navigator Aron Shoulders, MD as Consulting Physician (General Surgery) Lanny Callander, MD as Consulting Physician (Hematology) Shannon Agent, MD as Consulting Physician (Radiation Oncology)   CHIEF COMPLAINT:   Oncology History  Malignant neoplasm of overlapping sites of right breast in female, estrogen receptor positive (HCC)  05/10/2023 Initial Diagnosis   Malignant neoplasm of overlapping sites of right breast in female, estrogen receptor positive (HCC)   05/12/2023 Cancer Staging   Staging form: Breast, AJCC 8th Edition - Clinical stage from 05/12/2023: Stage IIIB (cT4, cN1, cM0, G2, ER+, PR+, HER2-) - Signed by Lanny Callander, MD on 05/12/2023 Stage prefix: Initial diagnosis Histologic grading system: 3 grade system      CURRENT THERAPY:   INTERVAL HISTORY   ROS   Past Medical History:  Diagnosis Date   Allergy    Atrial fibrillation (HCC)    Dysrhythmia    Hyperlipidemia    Hypertension    Neuromuscular disorder (HCC)    Venous insufficiency      Past Surgical History:  Procedure Laterality Date   ANKLE SURGERY Left    INTRAMEDULLARY (IM) NAIL INTERTROCHANTERIC Left 11/11/2022   Procedure: INTRAMEDULLARY (IM) NAIL INTERTROCHANTERIC;  Surgeon: Edna Toribio LABOR, MD;  Location: [facility];  Service: Orthopedics;  Laterality: Left;   KNEE SURGERY Left    ORIF PERIPROSTHETIC FRACTURE Left 10/15/2023   Procedure: OPEN REDUCTION INTERNAL FIXATION (ORIF) PERIPROSTHETIC FRACTURE;  Surgeon: Kendal Franky SQUIBB, MD;  Location: [facility];  Service: Orthopedics;  Laterality: Left;  LEFT DISTAL FEMUR FRACTURE   VARICOSE VEIN SURGERY       Outpatient Encounter Medications as of 12/21/2023  Medication Sig   acetaminophen  (TYLENOL ) 500 MG tablet Take  1,000 mg by mouth every 6 (six) hours as needed for mild pain.   anastrozole  (ARIMIDEX ) 1 MG tablet Take 1 tablet (1 mg total) by mouth daily.   azelastine  (ASTELIN ) 0.1 % nasal spray Place 2 sprays into both nostrils daily as needed for rhinitis or allergies.   cetirizine (ZYRTEC) 10 MG tablet Take 10 mg by mouth daily as needed for allergies.   diltiazem  (CARDIZEM  SR) 90 MG 12 hr capsule Take 1 capsule (90 mg total) by mouth every 12 (twelve) hours.   docusate sodium  (COLACE) 100 MG capsule Take 1 capsule (100 mg total) by mouth 2 (two) times daily as needed for mild constipation.   ferrous sulfate  325 (65 FE) MG tablet Take 325 mg by mouth daily with breakfast.   guanFACINE  (TENEX ) 2 MG tablet Take 2 mg by mouth every evening.   HYDROcodone -acetaminophen  (NORCO/VICODIN) 5-325 MG tablet Take 1-2 tablets by mouth every 6 (six) hours as needed (1 tablet pain score 4-6, 2 tablets pain score 7-10).   Lidocaine  HCl (ASPERCREME LIDOCAINE ) 4 % LIQD Apply 1 Application topically 2 (two) times daily as needed (knee pain).   methocarbamol  (ROBAXIN ) 500 MG tablet Take 1 tablet (500 mg total) by mouth every 6 (six) hours as needed for muscle spasms.   metoprolol  succinate (TOPROL -XL) 50 MG 24 hr tablet Take 1 tablet (50 mg total) by mouth 2 (two) times daily. Take with or immediately following a meal.   Multiple Vitamins-Minerals (CENTRUM SILVER 50+WOMEN) TABS Take 1 tablet by mouth every evening.   pantoprazole  (PROTONIX )  40 MG tablet Take 1 tablet (40 mg total) by mouth daily. (Patient taking differently: Take 40 mg by mouth daily as needed (Indigestion).)   polyethylene glycol (MIRALAX  / GLYCOLAX ) 17 g packet Take 17 g by mouth daily. (Patient taking differently: Take 17 g by mouth daily as needed for moderate constipation.)   rosuvastatin  (CRESTOR ) 10 MG tablet Take 1 tablet (10 mg total) by mouth See admin instructions. Take 10 mg by mouth at bedtime on Sun/Wed only (twice a week)   sertraline  (ZOLOFT ) 50  MG tablet Take 50 mg by mouth at bedtime.   spironolactone  (ALDACTONE ) 25 MG tablet Take 25 mg by mouth every evening.   SYSTANE COMPLETE PF 0.6 % SOLN Place 1 drop into both eyes as needed (Dry eye).   warfarin (COUMADIN ) 3 MG tablet Take 1 tablet (3 mg total) by mouth daily at 4 PM. Follow up with coumadin  clinic for INR checks/dose adjustment   [Paused] warfarin (JANTOVEN ) 2 MG tablet TAKE 1/2 TO 1 TABLET (1 TO 2MG ) DAILY OR AS DIRECTED  BY ANTICOAGULATION CLINIC (Patient taking differently: Take 1-2 mg by mouth every evening. Take 1mg  (0.5 tablets) by mouth on Sunday, Monday, Wednesday, and Friday. Take 2mg  (1 tablet) on Tuesday, Thursday, and Saturday.)   No facility-administered encounter medications on file as of 12/21/2023.     There were no vitals filed for this visit. There is no height or weight on file to calculate BMI.   ECOG PERFORMANCE STATUS: {CHL ONC ECOG PS:[id number]}  PHYSICAL EXAM GENERAL:alert, no distress and comfortable SKIN: no rash  EYES: sclera clear NECK: without mass LYMPH:  no palpable cervical or supraclavicular lymphadenopathy  LUNGS: clear with normal breathing effort HEART: regular rate & rhythm, no lower extremity edema ABDOMEN: abdomen soft, non-tender and normal bowel sounds NEURO: alert & oriented x 3 with fluent speech, no focal motor/sensory deficits Breast exam:  PAC without erythema    CBC    Latest Ref Rng & Units 10/18/2023    3:18 AM 10/17/2023    6:55 PM 10/17/2023    4:37 AM  CBC  WBC 4.0 - 10.5 K/uL 12.7   11.5   Hemoglobin 12.0 - 15.0 g/dL 89.6  89.9  7.0   Hematocrit 36.0 - 46.0 % 31.1  31.1  22.0   Platelets 150 - 400 K/uL 316   280       CMP     Latest Ref Rng & Units 10/18/2023    3:18 AM 10/17/2023    4:37 AM 10/16/2023    2:03 AM  CMP  Glucose 70 - 99 mg/dL 98  98  867   BUN 8 - 23 mg/dL 43  43  32   Creatinine 0.44 - 1.00 mg/dL 8.69  8.27  8.51   Sodium 135 - 145 mmol/L 133  132  132   Potassium 3.5 - 5.1 mmol/L 4.1   4.2  4.5   Chloride 98 - 111 mmol/L 100  98  96   CO2 22 - 32 mmol/L 23  24  21    Calcium  8.9 - 10.3 mg/dL 8.8  8.7  8.7       ASSESSMENT & PLAN:  PLAN:  No orders of the defined types were placed in this encounter.     All questions were answered. The patient knows to call the clinic with any problems, questions or concerns. No barriers to learning were detected. I spent *** counseling the patient face to face. The total time spent in  the appointment was *** and more than 50% was on counseling, review of test results, and coordination of care.   Teasia Zapf K Maddock Finigan, NP 12/19/2023 7:11 PM

## 2023-12-20 ENCOUNTER — Other Ambulatory Visit: Payer: Self-pay | Admitting: Cardiovascular Disease

## 2023-12-20 DIAGNOSIS — I484 Atypical atrial flutter: Secondary | ICD-10-CM

## 2023-12-21 ENCOUNTER — Encounter: Payer: Self-pay | Admitting: *Deleted

## 2023-12-21 ENCOUNTER — Inpatient Hospital Stay

## 2023-12-21 ENCOUNTER — Inpatient Hospital Stay: Admitting: Nurse Practitioner

## 2023-12-21 NOTE — Telephone Encounter (Signed)
 Refill request for warfarin:  Last INR was 2.0 on 10/06/23 10/12/23  Pt admitted to hospital  Pt in SNF.  Refill denied.

## 2023-12-28 ENCOUNTER — Encounter: Payer: Self-pay | Admitting: Nurse Practitioner

## 2023-12-28 ENCOUNTER — Inpatient Hospital Stay: Admitting: Nurse Practitioner

## 2023-12-28 ENCOUNTER — Encounter: Payer: Self-pay | Admitting: *Deleted

## 2023-12-28 ENCOUNTER — Inpatient Hospital Stay: Attending: Nurse Practitioner

## 2023-12-28 VITALS — BP 120/70 | HR 58 | Temp 97.7°F | Resp 17 | Wt 120.0 lb

## 2023-12-28 DIAGNOSIS — Z79811 Long term (current) use of aromatase inhibitors: Secondary | ICD-10-CM | POA: Insufficient documentation

## 2023-12-28 DIAGNOSIS — C50811 Malignant neoplasm of overlapping sites of right female breast: Secondary | ICD-10-CM

## 2023-12-28 DIAGNOSIS — R591 Generalized enlarged lymph nodes: Secondary | ICD-10-CM | POA: Diagnosis not present

## 2023-12-28 DIAGNOSIS — Z170 Estrogen receptor positive status [ER+]: Secondary | ICD-10-CM | POA: Insufficient documentation

## 2023-12-28 DIAGNOSIS — Z1732 Human epidermal growth factor receptor 2 negative status: Secondary | ICD-10-CM | POA: Insufficient documentation

## 2023-12-28 DIAGNOSIS — Z1721 Progesterone receptor positive status: Secondary | ICD-10-CM | POA: Diagnosis not present

## 2023-12-28 DIAGNOSIS — Z79899 Other long term (current) drug therapy: Secondary | ICD-10-CM | POA: Diagnosis not present

## 2023-12-28 LAB — CMP (CANCER CENTER ONLY)
ALT: 5 U/L (ref 0–44)
AST: 17 U/L (ref 15–41)
Albumin: 3.8 g/dL (ref 3.5–5.0)
Alkaline Phosphatase: 96 U/L (ref 38–126)
Anion gap: 9 (ref 5–15)
BUN: 35 mg/dL — ABNORMAL HIGH (ref 8–23)
CO2: 23 mmol/L (ref 22–32)
Calcium: 9.9 mg/dL (ref 8.9–10.3)
Chloride: 105 mmol/L (ref 98–111)
Creatinine: 1.64 mg/dL — ABNORMAL HIGH (ref 0.44–1.00)
GFR, Estimated: 31 mL/min — ABNORMAL LOW (ref >60)
Glucose, Bld: 101 mg/dL — ABNORMAL HIGH (ref 70–99)
Potassium: 4.4 mmol/L (ref 3.5–5.1)
Sodium: 137 mmol/L (ref 135–145)
Total Bilirubin: <0.2 mg/dL (ref 0.0–1.2)
Total Protein: 6.5 g/dL (ref 6.5–8.1)

## 2023-12-28 LAB — CBC WITH DIFFERENTIAL (CANCER CENTER ONLY)
Abs Immature Granulocytes: 0.02 K/uL (ref 0.00–0.07)
Basophils Absolute: 0.1 K/uL (ref 0.0–0.1)
Basophils Relative: 1 %
Eosinophils Absolute: 0.4 K/uL (ref 0.0–0.5)
Eosinophils Relative: 5 %
HCT: 30.7 % — ABNORMAL LOW (ref 36.0–46.0)
Hemoglobin: 9.7 g/dL — ABNORMAL LOW (ref 12.0–15.0)
Immature Granulocytes: 0 %
Lymphocytes Relative: 16 %
Lymphs Abs: 1.3 K/uL (ref 0.7–4.0)
MCH: 30.7 pg (ref 26.0–34.0)
MCHC: 31.6 g/dL (ref 30.0–36.0)
MCV: 97.2 fL (ref 80.0–100.0)
Monocytes Absolute: 0.7 K/uL (ref 0.1–1.0)
Monocytes Relative: 9 %
Neutro Abs: 5.8 K/uL (ref 1.7–7.7)
Neutrophils Relative %: 69 %
Platelet Count: 207 K/uL (ref 150–400)
RBC: 3.16 MIL/uL — ABNORMAL LOW (ref 3.87–5.11)
RDW: 13.8 % (ref 11.5–15.5)
WBC Count: 8.3 K/uL (ref 4.0–10.5)
nRBC: 0 % (ref 0.0–0.2)

## 2023-12-28 NOTE — Progress Notes (Signed)
 [facility]   Telephone:(336) [phone] Fax:(336) [phone]    Patient Care Team: Addie Camellia CROME, MD as PCP - General (Internal Medicine) Perla Evalene PARAS, MD as PCP - Cardiology (Cardiology) Tyree Nanetta SAILOR, RN as Oncology Nurse Navigator Aron Shoulders, MD as Consulting Physician (General Surgery) Lanny Callander, MD as Consulting Physician (Hematology) Shannon Agent, MD as Consulting Physician (Radiation Oncology)   CHIEF COMPLAINT: Follow up right breast cancer  Oncology History  Malignant neoplasm of overlapping sites of right breast in female, estrogen receptor positive (HCC)  05/10/2023 Initial Diagnosis   Malignant neoplasm of overlapping sites of right breast in female, estrogen receptor positive (HCC)   05/12/2023 Cancer Staging   Staging form: Breast, AJCC 8th Edition - Clinical stage from 05/12/2023: Stage IIIB (cT4, cN1, cM0, G2, ER+, PR+, HER2-) - Signed by Lanny Callander, MD on 05/12/2023 Stage prefix: Initial diagnosis Histologic grading system: 3 grade system      CURRENT THERAPY: Neoadjuvant Anastrozole   INTERVAL HISTORY Brandi [name] returns with her husband for follow-up, last seen by Dr. Lanny 09/14/2023 and began anastrozole , tolerating well without obvious side effects.  She fell at home, found to have distal femur fracture,/P ORIF 10/15/2023.  She was discharged to rehab and has returned home with PT. Activity level is low.  Denies pain at rest but has some hip and knee pain with mobility.  Denies other new or worsening bone pain. Has not noticed changes in the right breast.  ROS  All other systems reviewed and negative  Past Medical History:  Diagnosis Date   Allergy    Atrial fibrillation (HCC)    Dysrhythmia    Hyperlipidemia    Hypertension    Neuromuscular disorder (HCC)    Venous insufficiency      Past Surgical History:  Procedure Laterality Date   ANKLE SURGERY Left    INTRAMEDULLARY (IM) NAIL INTERTROCHANTERIC Left 11/11/2022    Procedure: INTRAMEDULLARY (IM) NAIL INTERTROCHANTERIC;  Surgeon: Edna Toribio LABOR, MD;  Location: [facility];  Service: Orthopedics;  Laterality: Left;   KNEE SURGERY Left    ORIF PERIPROSTHETIC FRACTURE Left 10/15/2023   Procedure: OPEN REDUCTION INTERNAL FIXATION (ORIF) PERIPROSTHETIC FRACTURE;  Surgeon: Kendal Franky SQUIBB, MD;  Location: [facility];  Service: Orthopedics;  Laterality: Left;  LEFT DISTAL FEMUR FRACTURE   VARICOSE VEIN SURGERY       Outpatient Encounter Medications as of 12/28/2023  Medication Sig   acetaminophen  (TYLENOL ) 500 MG tablet Take 1,000 mg by mouth every 6 (six) hours as needed for mild pain.   anastrozole  (ARIMIDEX ) 1 MG tablet Take 1 tablet (1 mg total) by mouth daily.   azelastine  (ASTELIN ) 0.1 % nasal spray Place 2 sprays into both nostrils daily as needed for rhinitis or allergies.   cetirizine (ZYRTEC) 10 MG tablet Take 10 mg by mouth daily as needed for allergies.   diltiazem  (CARDIZEM  SR) 90 MG 12 hr capsule Take 1 capsule (90 mg total) by mouth every 12 (twelve) hours.   docusate sodium  (COLACE) 100 MG capsule Take 1 capsule (100 mg total) by mouth 2 (two) times daily as needed for mild constipation.   ferrous sulfate  325 (65 FE) MG tablet Take 325 mg by mouth daily with breakfast.   guanFACINE  (TENEX ) 2 MG tablet Take 2 mg by mouth every evening.   HYDROcodone -acetaminophen  (NORCO/VICODIN) 5-325 MG tablet Take 1-2 tablets by mouth every 6 (six) hours as needed (1 tablet pain score 4-6, 2 tablets pain score 7-10).  Lidocaine  HCl (ASPERCREME LIDOCAINE ) 4 % LIQD Apply 1 Application topically 2 (two) times daily as needed (knee pain).   methocarbamol  (ROBAXIN ) 500 MG tablet Take 1 tablet (500 mg total) by mouth every 6 (six) hours as needed for muscle spasms.   metoprolol  succinate (TOPROL -XL) 50 MG 24 hr tablet Take 1 tablet (50 mg total) by mouth 2 (two) times daily. Take with or immediately following a meal.   Multiple Vitamins-Minerals (CENTRUM SILVER 50+WOMEN) TABS  Take 1 tablet by mouth every evening.   pantoprazole  (PROTONIX ) 40 MG tablet Take 1 tablet (40 mg total) by mouth daily. (Patient taking differently: Take 40 mg by mouth daily as needed (Indigestion).)   polyethylene glycol (MIRALAX  / GLYCOLAX ) 17 g packet Take 17 g by mouth daily. (Patient taking differently: Take 17 g by mouth daily as needed for moderate constipation.)   rosuvastatin  (CRESTOR ) 10 MG tablet Take 1 tablet (10 mg total) by mouth See admin instructions. Take 10 mg by mouth at bedtime on Sun/Wed only (twice a week)   sertraline  (ZOLOFT ) 50 MG tablet Take 50 mg by mouth at bedtime.   spironolactone  (ALDACTONE ) 25 MG tablet Take 25 mg by mouth every evening.   SYSTANE COMPLETE PF 0.6 % SOLN Place 1 drop into both eyes as needed (Dry eye).   warfarin (COUMADIN ) 3 MG tablet Take 1 tablet (3 mg total) by mouth daily at 4 PM. Follow up with coumadin  clinic for INR checks/dose adjustment   [Paused] warfarin (JANTOVEN ) 2 MG tablet TAKE 1/2 TO 1 TABLET (1 TO 2MG ) DAILY OR AS DIRECTED  BY ANTICOAGULATION CLINIC (Patient taking differently: Take 1-2 mg by mouth every evening. Take 1mg  (0.5 tablets) by mouth on Sunday, Monday, Wednesday, and Friday. Take 2mg  (1 tablet) on Tuesday, Thursday, and Saturday.)   No facility-administered encounter medications on file as of 12/28/2023.     Today's Vitals   12/28/23 1242 12/28/23 1250  BP: 120/70   Pulse: (!) 58   Resp: 17   Temp: 97.7 F (36.5 C)   SpO2: 96%   Weight: 120 lb (54.4 kg)   PainSc:  0-No pain   Body mass index is 24.24 kg/m.    PHYSICAL EXAM GENERAL:alert, no distress and comfortable SKIN: no rash  EYES: sclera clear NECK: without mass LYMPH:  no palpable cervical or supraclavicular lymphadenopathy  LUNGS: normal breathing effort HEART: no lower extremity edema NEURO: alert & oriented x 3 with fluent speech Breast exam: No nipple discharge.  Right breast with ~2 cm mass at the areola involving the skin, with a 1 - 2 cm  palpable right axillary lymph node   CBC    Latest Ref Rng & Units 12/28/2023   12:31 PM 10/18/2023    3:18 AM 10/17/2023    6:55 PM  CBC  WBC 4.0 - 10.5 K/uL 8.3  12.7    Hemoglobin 12.0 - 15.0 g/dL 9.7  89.6  89.9   Hematocrit 36.0 - 46.0 % 30.7  31.1  31.1   Platelets 150 - 400 K/uL 207  316        CMP     Latest Ref Rng & Units 12/28/2023   12:31 PM 10/18/2023    3:18 AM 10/17/2023    4:37 AM  CMP  Glucose 70 - 99 mg/dL 898  98  98   BUN 8 - 23 mg/dL 35  43  43   Creatinine 0.44 - 1.00 mg/dL 8.35  8.69  8.27   Sodium 135 -  145 mmol/L 137  133  132   Potassium 3.5 - 5.1 mmol/L 4.4  4.1  4.2   Chloride 98 - 111 mmol/L 105  100  98   CO2 22 - 32 mmol/L 23  23  24    Calcium  8.9 - 10.3 mg/dL 9.9  8.8  8.7   Total Protein 6.5 - 8.1 g/dL 6.5     Total Bilirubin 0.0 - 1.2 mg/dL <9.7     Alkaline Phos 38 - 126 U/L 96     AST 15 - 41 U/L 17     ALT 0 - 44 U/L 5         ASSESSMENT & PLAN: 83 year old female  Malignant neoplasm of overlapping sites of right breast in female, estrogen receptor positive, invasive ductal carcinoma of breast, [id number] stage IIIB.  -diagnosed in 04/2023.  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 Tumor size 3.3 cm at 9 o'clock position and 0.6 cm at 11 o'clock position with suspected lymph node involvement. Stage IIIB. - Chemotherapy not initially recommended due to her age and health status. -She began neoadjuvant anti-estrogen therapy with anastrozole  04/2023, due to strong ER and PR positivity.  Tolerating well without significant side effects -Surgery is deferred initially due to concerns about tolerance.  - Her CT scan from August 2024 was negative for metastasis.  A bone scan 05/14/2023 was also negative - Unfortunately she had a fall and femur fracture 09/2023, she is recovering gradually - Ms. Kissler appears stable, despite initial response to AI unfortunately she appears to have  local progression; the 7 mm mass at 10-11:00 has increased with a new suspicious mass adjacent to it, with enlarging right axillary lymphadenopathy per Dr. Vonzell at [facility] - She will meet with Dr. Aron next week to discuss the surgical plan.  -Case discussed with Dr. Lanny     PLAN: -[facility] imaging reviewed, local progression on AI -Meet with Dr. Aron 12/16 to discuss surgical plan -If she proceeds with surgery, we will see her post op to review path and discuss adjuvant therapy  -If no surgery, we will discuss changing to fulvestrant vs adding Ibrance -If no surgery, check Guardant 360 for ESR1 or other targetable mutations -F/up pending surgical plan    All questions were answered. The patient knows to call the clinic with any problems, questions or concerns. No barriers to learning were detected. I spent 20 minutes counseling the patient face to face. The total time spent in the appointment was 30 minutes and more than 50% was on counseling, review of test results, and coordination of care.   Dare Sanger K Ayslin Kundert, NP 12/28/2023

## 2023-12-29 ENCOUNTER — Ambulatory Visit (INDEPENDENT_AMBULATORY_CARE_PROVIDER_SITE_OTHER): Admitting: Internal Medicine

## 2023-12-29 DIAGNOSIS — Z7901 Long term (current) use of anticoagulants: Secondary | ICD-10-CM | POA: Diagnosis not present

## 2023-12-29 LAB — POCT INR: INR: 2.8 (ref 2.0–3.0)

## 2024-01-04 ENCOUNTER — Other Ambulatory Visit: Payer: Self-pay | Admitting: General Surgery

## 2024-01-04 ENCOUNTER — Telehealth (HOSPITAL_BASED_OUTPATIENT_CLINIC_OR_DEPARTMENT_OTHER): Payer: Self-pay | Admitting: *Deleted

## 2024-01-04 DIAGNOSIS — C50811 Malignant neoplasm of overlapping sites of right female breast: Secondary | ICD-10-CM

## 2024-01-04 NOTE — Telephone Encounter (Signed)
° °  Pre-operative Risk Assessment    Patient Name: Brandi [name]  DOB: 16-May-1940 MRN: [id number]   Date of last office visit: 05/04/23 SHERI HAMMOCK, NP Date of next office visit: 01/10/24 SHERI HAMMOCK, NP   Request for Surgical Clearance    Procedure:  RIGHT BREAST LUMPECTOMY AND SENTINEL LYMPH NODE Bx  Date of Surgery:  Clearance TBD URGENT PER FORM                                Surgeon:  DR. [name] Surgeon's Group or Practice Name:  [facility] Phone number:  [phone] Fax number:  [phone] KELSEY PHILLIPS, CMA   Type of Clearance Requested:   - Medical  - Pharmacy:  Hold Warfarin (Coumadin )     Type of Anesthesia:  General    Additional requests/questions:    Signed, Maleki Hippe   01/04/2024, 4:51 PM

## 2024-01-05 ENCOUNTER — Encounter: Payer: Self-pay | Admitting: Hematology

## 2024-01-05 ENCOUNTER — Encounter: Payer: Self-pay | Admitting: *Deleted

## 2024-01-05 NOTE — Telephone Encounter (Signed)
 Callback - please confirm with surgeon's office OK to wait for preop assessment visit 12/22 with Tylene Lunch at which time preop evaluation can take place - patient is not a great virtual visit candidate due to h/o mobility issues limiting assessment.  Will also route to pharm for input on warfarin so that this info is available for Sheri at OV.

## 2024-01-05 NOTE — Telephone Encounter (Signed)
 Will update the pt has appt 01/10/24 with Tylene Lunch, NP for preop clearance. Per preop APP pt not a candidate for virtual visit.   Once pt has been cleared from 01/10/24 visit provider will send notes to surgeon office.  I will call surgeon office as well.  I left verbal message with staff to let Dr. Aron know the pt has appt 01/10/24 with Tylene Lunch, NP for preop clearance.

## 2024-01-09 NOTE — Progress Notes (Unsigned)
 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    Brandi [name] was first seen by Dr. Gollan 05/2017 after near-syncopal episode with bowel movement, likely due to vasovagal response.    Was seen again in the hospital 2023 when she went into atrial fibrillation/atrial flutter in the setting of pneumonia.  Echo 07/09/2021 LVEF 60-65%, G1DD, trivial MR, mild calcification of the AV, biatrial enlargement, moderate pulmonary hypertension.  Discharged on warfarin due to cost and preference.  Also on diltiazem  and metoprolol .  Noted bradycardia in the office 04/13/2022, metoprolol  decreased and diltiazem  continued.  Doing well from cardiac standpoint at time of visit 05/04/23.  Had just begun treatment with anastrozole  for right-sided breast cancer.  Unfortunately admitted 07/28/2023 after a fall with subsequent SDH and seizure.  Warfarin held and INR reversed, seizure treated with Keppra .  Echo 07/29/2023 LVEF 60-65%, PASP 52, moderate MR, moderate TR. Discharged 07/31/2023 to SNF back on warfarin.  Admitted again 10/12/2023 after another fall with fracture of left femur. ORIF 10/15/2023. Noted to back in a fib with RVR during hospitalization.  Treated with diltiazem , metoprolol , and continued warfarin.  Discharged back to [facility] 10/20/2023.  Breast cancer has progressed, and she is preparing to undergo lumpectomy and lymph node biopsy.     History of Present Illness: Today she is doing okay, was discharged from SNF to home with home health  around three weeks ago. Still in wheelchair majority of the time, utilizes walker at home and is walking around 30 feet per day with home health.  Denies chest discomfort, DOE, orthopnea, PND, LE edema, abnormal bleeding.  Taking medications as prescribed with the help of her husband.  States that she just saw PCP Dr. Addie and had labs drawn 12/22/2023.  ROS: Please see the history of present illness. All other systems reviewed and are negative.    Studies Reviewed: The following studies were personally reviewed today: Cardiac Studies & Procedures   ______________________________________________________________________________________________     ECHOCARDIOGRAM  ECHOCARDIOGRAM COMPLETE 07/29/2023  Narrative ECHOCARDIOGRAM REPORT    Patient Name:   Brandi [name] Date of Exam: 07/29/2023 Medical Rec #:  [id number]         Height:       59.0 in Accession #:    [id number]        Weight:       118.0 lb Date of Birth:  [birth year]          BSA:          1.474 m Patient Age:    83 years          BP:           138/84 mmHg Patient Gender: F                 HR:           110 bpm. Exam Location:  Inpatient  Procedure: 2D Echo, Cardiac Doppler and Color Doppler (Both Spectral and Color Flow Doppler were utilized during procedure).  Indications:    R55 Syncope; R94.31 Abnormal EKG  History:        Patient has prior history of Echocardiogram examinations, most recent 07/09/2021. Abnormal ECG, Arrythmias:Atrial Fibrillation and Atrial Flutter, Signs/Symptoms:Chest Pain, Dyspnea and Shortness of Breath; Risk Factors:Dyslipidemia and Hypertension.  Sonographer:    Ellouise Mose RDCS Referring Phys: [id number] CLARETTA HERO [facility]   Sonographer Comments: Technically difficult study due to poor echo windows. Patient moving during exam, rolling towards right side of bed. IMPRESSIONS   1. Left ventricular ejection fraction, by estimation, is 60 to 65%. The left ventricle has normal function. The left  ventricle has no regional wall motion abnormalities. Left ventricular diastolic parameters are indeterminate. 2. Right ventricular systolic function is normal. The right ventricular size is mildly enlarged. There is moderately elevated pulmonary artery systolic pressure. The estimated right ventricular systolic pressure is 52.5 mmHg. 3. Left atrial size was moderately dilated. 4. Right atrial size was moderately dilated. 5. The mitral valve is degenerative. Moderate mitral valve regurgitation. No evidence of mitral stenosis. 6. Tricuspid valve regurgitation is moderate. 7. The aortic valve is tricuspid. Aortic valve regurgitation is trivial. No aortic stenosis is present. 8. The inferior vena cava is dilated in size with <50% respiratory variability, suggesting right atrial pressure of 15 mmHg.  FINDINGS Left Ventricle: Left ventricular ejection fraction, by estimation, is 60 to 65%. The left ventricle has normal function. The left ventricle has no regional wall motion abnormalities. The left ventricular internal cavity size was normal in size. There is borderline left ventricular hypertrophy. Left ventricular diastolic function could not be evaluated due to atrial fibrillation. Left ventricular diastolic parameters are indeterminate.  Right Ventricle: The right ventricular size is mildly enlarged. No increase in right ventricular wall thickness. Right ventricular systolic function is normal. There is moderately elevated pulmonary artery systolic pressure. The tricuspid regurgitant velocity is 3.06 m/s, and with an assumed right atrial pressure of 15 mmHg, the estimated right ventricular systolic pressure is 52.5 mmHg.  Left Atrium: Left atrial size was moderately dilated.  Right Atrium: Right atrial size was moderately dilated.  Pericardium: There is no evidence of pericardial effusion.  Mitral Valve: The mitral valve is degenerative in appearance. Mild mitral annular calcification. Moderate  mitral valve regurgitation. No evidence of mitral valve stenosis. MV peak gradient, 7.1 mmHg. The mean mitral valve gradient is 2.0 mmHg.  Tricuspid Valve: The tricuspid valve is normal in structure. Tricuspid valve regurgitation is moderate . No evidence of tricuspid stenosis.  Aortic Valve: The aortic valve is tricuspid. Aortic valve regurgitation is trivial. No aortic stenosis is present.  Pulmonic Valve: The pulmonic valve was normal in structure. Pulmonic valve regurgitation is not visualized. No evidence of pulmonic stenosis.  Aorta: The aortic root and ascending aorta are structurally normal, with no evidence of dilitation.  Venous: The inferior vena cava is dilated in size with less than 50% respiratory variability, suggesting right atrial pressure of 15 mmHg.  IAS/Shunts: No atrial level shunt detected by color flow Doppler.   LEFT VENTRICLE PLAX 2D LVIDd:         4.60 cm LVIDs:         3.10 cm LV PW:         0.70 cm LV IVS:        0.90 cm LVOT diam:     2.00 cm LV SV:         60 LV SV Index:   41 LVOT Area:     3.14 cm  LV Volumes (MOD) LV vol d, MOD A2C: 60.5 ml  LV vol d, MOD A4C: 45.6 ml LV vol s, MOD A2C: 25.0 ml LV vol s, MOD A4C: 19.5 ml LV SV MOD A2C:     35.5 ml LV SV MOD A4C:     45.6 ml LV SV MOD BP:      33.4 ml  RIGHT VENTRICLE             IVC RV S prime:     13.20 cm/s  IVC diam: 2.40 cm TAPSE (M-mode): 1.7 cm  LEFT ATRIUM             Index        RIGHT ATRIUM           Index LA diam:        4.50 cm 3.05 cm/m   RA Area:     16.00 cm LA Vol (A2C):   77.2 ml 52.38 ml/m  RA Volume:   37.90 ml  25.71 ml/m LA Vol (A4C):   44.7 ml 30.33 ml/m LA Biplane Vol: 63.0 ml 42.74 ml/m AORTIC VALVE LVOT Vmax:   98.30 cm/s LVOT Vmean:  65.800 cm/s LVOT VTI:    0.191 m  AORTA Ao Root diam: 2.90 cm Ao Asc diam:  3.70 cm  MITRAL VALVE                  TRICUSPID VALVE MV Area (PHT): 5.66 cm       TR Peak grad:   37.5 mmHg MV Area VTI:   2.23 cm        TR Vmax:        306.00 cm/s MV Peak grad:  7.1 mmHg MV Mean grad:  2.0 mmHg       SHUNTS MV Vmax:       1.33 m/s       Systemic VTI:  0.19 m MV Vmean:      66.8 cm/s      Systemic Diam: 2.00 cm MV Decel Time: 134 msec MR Peak grad:    104.4 mmHg MR Mean grad:    72.0 mmHg MR Vmax:         511.00 cm/s MR Vmean:        398.0 cm/s MR PISA:         0.57 cm MR PISA Eff ROA: 4 mm MR PISA Radius:  0.30 cm MV E velocity: 101.00 cm/s  Toribio Fuel MD Electronically signed by Toribio Fuel MD Signature Date/Time: 07/29/2023/8:20:38 PM    Final          ______________________________________________________________________________________________     EKG Interpretation Date/Time:  Monday January 10 2024 14:27:28 EST Ventricular Rate:  80 PR Interval:    QRS Duration:  68 QT Interval:  356 QTC Calculation: 410 R Axis:   -19 Text Interpretation: Atrial flutter with variable A-V block Confirmed by Nola Numbers [phone]) on 01/10/2024 2:29:59 PM  Physical Exam: VS: BP 130/76 (BP Location: Left Arm, Patient Position: Sitting, Cuff Size: Normal)   Pulse 80   Ht 4' 11 (1.499 m)   Wt 116 lb 12.8 oz (53 kg)   SpO2 96%   BMI 23.59 kg/m   GEN: Into clinic in wheelchair, in NAD HEENT: Normal NECK: No JVD CARDIAC: Irregular rhythm, no murmurs, rubs, gallops RESPIRATORY: Diminished to auscultation bilaterally ABDOMEN: Soft, non-tender, non-distended MUSCULOSKELETAL: No edema SKIN: Warm and dry NEUROLOGIC:  Forgetful PSYCHIATRIC:  Flat affect   Assessment & Plan: 1. Paroxysmal atrial fibrillation/atrial flutter: EKG today AFL 80 bpm, similar to past  EKGs. Denies chest pain, DOE, LE edema, abnormal bleeding. CHA2DS2-VASc=4.  - Continue diltiazem  90 mg BID - Continue metoprolol  succinate 50 mg BID - Continue Coumadin  per clinic protocol -follows with Coumadin  clinic regularly  2. Diastolic dysfunction: Echo 07/29/2023 LVEF 60-65%, diastolic dysfunction noted on echo  07/09/2021.  12/28/2023 creatinine 1.64, potassium 4.4.  Creatinine appears to be at baseline.  Appears euvolemic on exam. - Continue spironolactone  25 mg daily  3. Hypertension: BP today 130/76.  Well-controlled. - Treated in the setting of HF as above  4. Hyperlipidemia: Reportedly did not tolerate statins in the past, is now tolerating rosuvastatin  10 mg twice per week. 12/28/2023: ALT 5.  Lipid panel not available, but patient just saw PCP 12/22/2023 and states that she had this blood work drawn.  - Continue rosuvastatin  10 mg twice per week - Continue management per PCP  5. Preoperative cardiac risk assessment: Surgery not yet scheduled, but needs to be done urgently per notes.  Lumpectomy and lymph node biopsy generally low risk surgery.  RCRI shows low (0.4%) perioperative risk of major cardiac event.  METS difficult to assess given functional disability.  However, she has been increasing her activity slowly and denies cardiac symptoms. She is in AFL that is rate-controlled today, but this is not new for her. She is taking Coumadin  as directed by the Coumadin  clinic as above.  - Recommend continuous telemetry monitoring during procedure, keep K > 4 and Mg > 2 during perioperative period - No ischemic evaluation necessary prior to procedure unless she develops new symptoms - Patient can hold warfarin for 5 days prior to procedure. Patient will not need bridging with Lovenox  (enoxaparin ) around procedure.  Per PharmD    Dispo: Follow-up in 6 months or sooner if needed.  Signed, Saddie GORMAN Cleaves, NP 01/10/2024 3:14 PM [facility] "

## 2024-01-10 ENCOUNTER — Encounter: Payer: Self-pay | Admitting: Cardiology

## 2024-01-10 ENCOUNTER — Ambulatory Visit: Attending: Cardiology

## 2024-01-10 VITALS — BP 130/76 | HR 80 | Ht 59.0 in | Wt 116.8 lb

## 2024-01-10 DIAGNOSIS — I480 Paroxysmal atrial fibrillation: Secondary | ICD-10-CM | POA: Diagnosis not present

## 2024-01-10 DIAGNOSIS — E782 Mixed hyperlipidemia: Secondary | ICD-10-CM | POA: Diagnosis not present

## 2024-01-10 DIAGNOSIS — I5189 Other ill-defined heart diseases: Secondary | ICD-10-CM | POA: Diagnosis not present

## 2024-01-10 DIAGNOSIS — Z01810 Encounter for preprocedural cardiovascular examination: Secondary | ICD-10-CM | POA: Diagnosis not present

## 2024-01-10 DIAGNOSIS — I10 Essential (primary) hypertension: Secondary | ICD-10-CM

## 2024-01-10 NOTE — Telephone Encounter (Signed)
 Patient with diagnosis of aflutter on warfarin for anticoagulation.    Procedure: RIGHT BREAST LUMPECTOMY AND SENTINEL LYMPH NODE Bx  Date of procedure: Urgent   CHA2DS2-VASc Score = 4   This indicates a 4.8% annual risk of stroke. The patient's score is based upon: CHF History: 0 HTN History: 1 Diabetes History: 0 Stroke History: 0 Vascular Disease History: 0 Age Score: 2 Gender Score: 1      CrCl 22 ml/min Platelet count 207  Patient has not had an Afib/aflutter ablation in the last 3 months, DCCV within the last 4 weeks or a watchman implanted in the last 45 days   Per office protocol, patient can hold warfarin for 5 days prior to procedure.    Patient will not need bridging with Lovenox  (enoxaparin ) around procedure.  **This guidance is not considered finalized until pre-operative APP has relayed final recommendations.**

## 2024-01-10 NOTE — Progress Notes (Signed)
 Note efaxed to Lawerance Nephew, MD

## 2024-01-10 NOTE — Telephone Encounter (Signed)
 Recs now available/acknowledged by Sheri/Kara for OV today. Will remove from preop APP box.

## 2024-01-10 NOTE — Patient Instructions (Signed)
 Medication Instructions:  Your physician recommends that you continue on your current medications as directed. Please refer to the Current Medication list given to you today.   *If you need a refill on your cardiac medications before your next appointment, please call your pharmacy*  Lab Work: No labs ordered today  If you have labs (blood work) drawn today and your tests are completely normal, you will receive your results only by: MyChart Message (if you have MyChart) OR A paper copy in the mail If you have any lab test that is abnormal or we need to change your treatment, we will call you to review the results.  Testing/Procedures: No test ordered today   Follow-Up: At [facility], you and your health needs are our priority.  As part of our continuing mission to provide you with exceptional heart care, our providers are all part of one team.  This team includes your primary Cardiologist (physician) and Advanced Practice Providers or APPs (Physician Assistants and Nurse Practitioners) who all work together to provide you with the care you need, when you need it.  Your next appointment:   6 month(s)  Provider:   You may see Timothy Gollan, MD or one of the following Advanced Practice Providers on your designated Care Team:   Tylene Lunch, NP  We recommend signing up for the patient portal called MyChart.  Sign up information is provided on this After Visit Summary.  MyChart is used to connect with patients for Virtual Visits (Telemedicine).  Patients are able to view lab/test results, encounter notes, upcoming appointments, etc.  Non-urgent messages can be sent to your provider as well.   To learn more about what you can do with MyChart, go to [email or web address].

## 2024-01-10 NOTE — Telephone Encounter (Signed)
 Sent secure chat linking pharm team with Tylene Lunch today for anticoag recs given OV today. Will otherwise remove from preop APP box.

## 2024-01-19 ENCOUNTER — Ambulatory Visit (INDEPENDENT_AMBULATORY_CARE_PROVIDER_SITE_OTHER): Admitting: Internal Medicine

## 2024-01-19 DIAGNOSIS — Z7901 Long term (current) use of anticoagulants: Secondary | ICD-10-CM

## 2024-01-19 LAB — POCT INR: INR: 4.2 — AB (ref 2.0–3.0)

## 2024-01-26 ENCOUNTER — Ambulatory Visit (INDEPENDENT_AMBULATORY_CARE_PROVIDER_SITE_OTHER): Payer: Self-pay | Admitting: Internal Medicine

## 2024-01-26 DIAGNOSIS — Z7901 Long term (current) use of anticoagulants: Secondary | ICD-10-CM

## 2024-01-26 LAB — POCT INR: INR: 2.5 (ref 2.0–3.0)

## 2024-01-27 ENCOUNTER — Encounter: Payer: Self-pay | Admitting: *Deleted

## 2024-02-07 ENCOUNTER — Encounter (HOSPITAL_COMMUNITY): Payer: Self-pay

## 2024-02-07 NOTE — Pre-Procedure Instructions (Signed)
 Surgical Instructions   Your procedure is scheduled on February 17, 2024. Report to [facility] Main Entrance A at 7:25 A.M., then check in with the Admitting office. Any questions or running late day of surgery: call [phone]  Questions prior to your surgery date: call [phone], Monday-Friday, 8am-4pm. If you experience any cold or flu symptoms such as cough, fever, chills, shortness of breath, etc. between now and your scheduled surgery, please notify us  at the above number.     Remember:  Do not eat after midnight the night before your surgery   You may drink clear liquids until 6:25 AM the morning of your surgery.   Clear liquids allowed are: Water , Non-Citrus Juices (without pulp), Carbonated Beverages, Clear Tea (no milk, honey, etc.), Black Coffee Only (NO MILK, CREAM OR POWDERED CREAMER of any kind), and Gatorade.    Take these medicines the morning of surgery with A SIP OF WATER : anastrozole  (ARIMIDEX )  diltiazem  (CARDIZEM  SR)  metoprolol  succinate (TOPROL -XL)  pantoprazole  (PROTONIX )    May take these medicines IF NEEDED: acetaminophen  (TYLENOL )  azelastine  (ASTELIN ) nasal spray  cetirizine (ZYRTEC)  docusate sodium  (COLACE)  HYDROcodone -acetaminophen  (NORCO/VICODIN)  methocarbamol  (ROBAXIN )  SYSTANE COMPLETE eye drops   STOP taking your warfarin (JANTOVEN ) five days prior to surgery. Your last dose will be January 23rd.   One week prior to surgery, STOP taking any Aspirin  (unless otherwise instructed by your surgeon) Aleve, Naproxen, Ibuprofen, Motrin, Advil, Goody's, BC's, all herbal medications, fish oil, and non-prescription vitamins.                     Do NOT Smoke (Tobacco/Vaping) for 24 hours prior to your procedure.  If you use a CPAP at night, you may bring your mask/headgear for your overnight stay.   You will be asked to remove any contacts, glasses, piercing's, hearing aid's, dentures/partials prior to surgery. Please bring cases for these  items if needed.    Your surgeon will determine if you are to be admitted or discharged the same day.  Patients discharged the day of surgery will not be allowed to drive home, and someone needs to stay with them for 24 hours.  SURGICAL WAITING ROOM VISITATION Patients may have no more than 2 support people in the waiting area - these visitors may rotate.   Pre-op nurse will coordinate an appropriate time for 2 ADULT support persons, who may not rotate, to accompany patient in pre-op.  Children under the age of 75 must have an adult with them who is not the patient and must remain in the main waiting area with an adult.  If the patient needs to stay at the hospital during part of their recovery, the visitor guidelines for inpatient rooms apply.  Please refer to the [facility] website for the visitor guidelines for any additional information.   If you received a COVID test during your pre-op visit  it is requested that you wear a mask when out in public, stay away from anyone that may not be feeling well and notify your surgeon if you develop symptoms. If you have been in contact with anyone that has tested positive in the last 10 days please notify you surgeon.      Pre-operative CHG Bathing Instructions   You can play a key role in reducing the risk of infection after surgery. Your skin needs to be as free of germs as possible. You can reduce the number of germs on your skin by washing with  CHG (chlorhexidine  gluconate) soap before surgery. CHG is an antiseptic soap that kills germs and continues to kill germs even after washing.   DO NOT use if you have an allergy to chlorhexidine /CHG or antibacterial soaps. If your skin becomes reddened or irritated, stop using the CHG and notify one of our RNs at [phone].              TAKE A SHOWER THE NIGHT BEFORE SURGERY   Please keep in mind the following:  DO NOT shave, including legs and underarms, 48 hours prior to surgery.   You may  shave your face before/day of surgery.  Place clean sheets on your bed the night before surgery Use a clean washcloth (not used since being washed) for shower. DO NOT sleep with pet's night before surgery.  CHG Shower Instructions:  Wash your face and private area with normal soap. If you choose to wash your hair, wash first with your normal shampoo.  After you use shampoo/soap, rinse your hair and body thoroughly to remove shampoo/soap residue.  Turn the water  OFF and apply half the bottle of CHG soap to a CLEAN washcloth.  Apply CHG soap ONLY FROM YOUR NECK DOWN TO YOUR TOES (washing for 3-5 minutes)  DO NOT use CHG soap on face, private areas, open wounds, or sores.  Pay special attention to the area where your surgery is being performed.  If you are having back surgery, having someone wash your back for you may be helpful. Wait 2 minutes after CHG soap is applied, then you may rinse off the CHG soap.  Pat dry with a clean towel  Put on clean pajamas    Additional instructions for the day of surgery: If you choose, you may shower the morning of surgery with an antibacterial soap.  DO NOT APPLY any lotions, deodorants, cologne, or perfumes.   Do not wear jewelry or makeup Do not wear nail polish, gel polish, artificial nails, or any other type of covering on natural nails (fingers and toes) Do not bring valuables to the hospital. [facility] is not responsible for valuables/personal belongings. Put on clean/comfortable clothes.  Please brush your teeth.  Ask your nurse before applying any prescription medications to the skin.

## 2024-02-08 ENCOUNTER — Encounter (HOSPITAL_COMMUNITY)
Admission: RE | Admit: 2024-02-08 | Discharge: 2024-02-08 | Disposition: A | Discharge status: Home / Self Care | Source: Ambulatory Visit | Attending: General Surgery | Admitting: General Surgery

## 2024-02-08 ENCOUNTER — Encounter (HOSPITAL_COMMUNITY): Payer: Self-pay

## 2024-02-08 ENCOUNTER — Other Ambulatory Visit: Payer: Self-pay

## 2024-02-08 VITALS — BP 146/86 | HR 76 | Temp 97.6°F | Resp 16 | Ht 59.0 in | Wt 116.6 lb

## 2024-02-08 DIAGNOSIS — N183 Chronic kidney disease, stage 3 unspecified: Secondary | ICD-10-CM | POA: Diagnosis not present

## 2024-02-08 DIAGNOSIS — I130 Hypertensive heart and chronic kidney disease with heart failure and stage 1 through stage 4 chronic kidney disease, or unspecified chronic kidney disease: Secondary | ICD-10-CM | POA: Diagnosis not present

## 2024-02-08 DIAGNOSIS — I480 Paroxysmal atrial fibrillation: Secondary | ICD-10-CM | POA: Insufficient documentation

## 2024-02-08 DIAGNOSIS — E785 Hyperlipidemia, unspecified: Secondary | ICD-10-CM | POA: Diagnosis not present

## 2024-02-08 DIAGNOSIS — Z7901 Long term (current) use of anticoagulants: Secondary | ICD-10-CM | POA: Diagnosis not present

## 2024-02-08 DIAGNOSIS — F0390 Unspecified dementia without behavioral disturbance: Secondary | ICD-10-CM | POA: Insufficient documentation

## 2024-02-08 DIAGNOSIS — I4892 Unspecified atrial flutter: Secondary | ICD-10-CM | POA: Diagnosis not present

## 2024-02-08 DIAGNOSIS — Z79899 Other long term (current) drug therapy: Secondary | ICD-10-CM | POA: Diagnosis not present

## 2024-02-08 DIAGNOSIS — Z01812 Encounter for preprocedural laboratory examination: Secondary | ICD-10-CM | POA: Insufficient documentation

## 2024-02-08 DIAGNOSIS — I5030 Unspecified diastolic (congestive) heart failure: Secondary | ICD-10-CM | POA: Insufficient documentation

## 2024-02-08 DIAGNOSIS — K219 Gastro-esophageal reflux disease without esophagitis: Secondary | ICD-10-CM | POA: Insufficient documentation

## 2024-02-08 DIAGNOSIS — D631 Anemia in chronic kidney disease: Secondary | ICD-10-CM | POA: Diagnosis not present

## 2024-02-08 DIAGNOSIS — Z01818 Encounter for other preprocedural examination: Secondary | ICD-10-CM

## 2024-02-08 DIAGNOSIS — R569 Unspecified convulsions: Secondary | ICD-10-CM | POA: Insufficient documentation

## 2024-02-08 HISTORY — DX: Unspecified osteoarthritis, unspecified site: M19.90

## 2024-02-08 HISTORY — DX: Fibromyalgia: M79.7

## 2024-02-08 HISTORY — DX: Unspecified asthma, uncomplicated: J45.909

## 2024-02-08 HISTORY — DX: Pneumonia, unspecified organism: J18.9

## 2024-02-08 HISTORY — DX: Depression, unspecified: F32.A

## 2024-02-08 HISTORY — DX: Cardiac murmur, unspecified: R01.1

## 2024-02-08 LAB — CBC
HCT: 35.2 % — ABNORMAL LOW (ref 36.0–46.0)
Hemoglobin: 11.3 g/dL — ABNORMAL LOW (ref 12.0–15.0)
MCH: 31.2 pg (ref 26.0–34.0)
MCHC: 32.1 g/dL (ref 30.0–36.0)
MCV: 97.2 fL (ref 80.0–100.0)
Platelets: 249 K/uL (ref 150–400)
RBC: 3.62 MIL/uL — ABNORMAL LOW (ref 3.87–5.11)
RDW: 14.4 % (ref 11.5–15.5)
WBC: 8.2 K/uL (ref 4.0–10.5)
nRBC: 0 % (ref 0.0–0.2)

## 2024-02-08 LAB — BASIC METABOLIC PANEL WITH GFR
Anion gap: 10 (ref 5–15)
BUN: 33 mg/dL — ABNORMAL HIGH (ref 8–23)
CO2: 23 mmol/L (ref 22–32)
Calcium: 10 mg/dL (ref 8.9–10.3)
Chloride: 103 mmol/L (ref 98–111)
Creatinine, Ser: 1.73 mg/dL — ABNORMAL HIGH (ref 0.44–1.00)
GFR, Estimated: 29 mL/min — ABNORMAL LOW
Glucose, Bld: 98 mg/dL (ref 70–99)
Potassium: 5.4 mmol/L — ABNORMAL HIGH (ref 3.5–5.1)
Sodium: 136 mmol/L (ref 135–145)

## 2024-02-08 NOTE — Progress Notes (Signed)
 PCP - Camellia Hutchinson Cardiologist -  Evalene Lunger  PPM/ICD - denies Device Orders - n/a Rep Notified - n/a  Chest x-ray - n/a EKG - 01-10-24 Stress Test - denies ECHO - 07-29-23 Cardiac Cath - denies  Sleep Study - denies CPAP - n/a  Fasting Blood Sugar - No DM Checks Blood Sugar _____ times a day  Last dose of GLP1 agonist-  n/a GLP1 instructions: n/a  Blood Thinner Instructions: Warfarin, pt informed last dose of medication before surgery 02/11/24 Aspirin  Instructions: n/a  ERAS Protcol - Pt informed to stop clear liquids 3 hrs prior to surgery. PRE-SURGERY Ensure or G2- n/a  COVID TEST- n/a   Anesthesia review: Breast seed scheduled 02-09-24, PMH: Afib, HTN  Patient denies shortness of breath, fever, cough and chest pain at PAT appointment   All instructions explained to the patient, with a verbal understanding of the material. Patient agrees to go over the instructions while at home for a better understanding. Patient also instructed to self quarantine after being tested for COVID-19. The opportunity to ask questions was provided.

## 2024-02-09 NOTE — Progress Notes (Signed)
 Anesthesia Chart Review:  84 year old female follows with cardiology for history of diastolic dysfunction, HTN, HLD, paroxysmal atrial fibrillation/flutter on warfarin, venous insufficiency. Admitted 07/28/2023 after a fall with subsequent SDH and seizure.  Warfarin held and INR reversed, seizure treated with Keppra .  Echo 07/29/2023 LVEF 60-65%, PASP 52, moderate MR, moderate TR. Discharged 07/31/2023 to SNF back on warfarin. Admitted again 10/12/2023 after another fall with fracture of left femur. ORIF 10/15/2023. Noted to back in a fib with RVR during hospitalization.  Treated with diltiazem , metoprolol , and continued warfarin.  Discharged back to [facility] 10/20/2023.  Last seen in cardiology follow-up by Deitra Cleaves, NP on 01/10/2024.  Upcoming surgery discussed.  Per note, Surgery not yet scheduled, but needs to be done urgently per notes.  Lumpectomy and lymph node biopsy generally low risk surgery.  RCRI shows low (0.4%) perioperative risk of major cardiac event.  METS difficult to assess given functional disability.  However, she has been increasing her activity slowly and denies cardiac symptoms. She is in AFL that is rate-controlled today, but this is not new for her. She is taking Coumadin  as directed by the Coumadin  clinic as above.  - Recommend continuous telemetry monitoring during procedure, keep K > 4 and Mg > 2 during perioperative period - No ischemic evaluation necessary prior to procedure unless she develops new symptoms - Patient can hold warfarin for 5 days prior to procedure. Patient will not need bridging with Lovenox  (enoxaparin ) around procedure.  Per PharmD.  Other pertinent history includes GERD on PPI, mild dementia, CKD 3.  Patient reports last dose Coumadin  02/11/2024.  Preop labs reviewed, mild hyperkalemia potassium 5.4, creatinine 1.73 consistent with history of CKD, mild anemia hemoglobin 11.3.  EKG 01/10/2024: Atrial flutter with variable AV block.  Ventricular rate 80.  TTE  07/29/2023: 1. Left ventricular ejection fraction, by estimation, is 60 to 65%. The  left ventricle has normal function. The left ventricle has no regional  wall motion abnormalities. Left ventricular diastolic parameters are  indeterminate.   2. Right ventricular systolic function is normal. The right ventricular  size is mildly enlarged. There is moderately elevated pulmonary artery  systolic pressure. The estimated right ventricular systolic pressure is  52.5 mmHg.   3. Left atrial size was moderately dilated.   4. Right atrial size was moderately dilated.   5. The mitral valve is degenerative. Moderate mitral valve regurgitation.  No evidence of mitral stenosis.   6. Tricuspid valve regurgitation is moderate.   7. The aortic valve is tricuspid. Aortic valve regurgitation is trivial.  No aortic stenosis is present.   8. The inferior vena cava is dilated in size with <50% respiratory  variability, suggesting right atrial pressure of 15 mmHg.     Lynwood Geofm RIGGERS [facility]/Anesthesiology Phone [phone] 02/09/2024 3:48 PM

## 2024-02-09 NOTE — Anesthesia Preprocedure Evaluation (Addendum)
 "                                  Anesthesia Evaluation  Patient identified by MRN, date of birth, ID band Patient awake    Reviewed: Allergy & Precautions, NPO status , Patient's Chart, lab work & pertinent test results  Airway Mallampati: III  TM Distance: >3 FB Neck ROM: Full    Dental  (+) Teeth Intact, Dental Advisory Given   Pulmonary asthma    Pulmonary exam normal breath sounds clear to auscultation       Cardiovascular hypertension, Normal cardiovascular exam+ dysrhythmias Atrial Fibrillation  Rhythm:Regular Rate:Normal     Neuro/Psych Seizures -,  PSYCHIATRIC DISORDERS  Depression       GI/Hepatic negative GI ROS, Neg liver ROS,,,  Endo/Other  negative endocrine ROS    Renal/GU Renal InsufficiencyRenal disease     Musculoskeletal  (+) Arthritis ,  Fibromyalgia -  Abdominal   Peds  Hematology negative hematology ROS (+)   Anesthesia Other Findings Day of surgery medications reviewed with the patient.  Reproductive/Obstetrics                              Anesthesia Physical Anesthesia Plan  ASA: 3  Anesthesia Plan: General   Post-op Pain Management: Regional block* and Tylenol  PO (pre-op)*   Induction: Intravenous  PONV Risk Score and Plan: 3 and Dexamethasone  and Ondansetron   Airway Management Planned: LMA  Additional Equipment:   Intra-op Plan:   Post-operative Plan: Extubation in OR  Informed Consent: I have reviewed the patients History and Physical, chart, labs and discussed the procedure including the risks, benefits and alternatives for the proposed anesthesia with the patient or authorized representative who has indicated his/her understanding and acceptance.     Dental advisory given  Plan Discussed with: CRNA  Anesthesia Plan Comments: (PAT note by Lynwood Hope, PA-C: 84 year old female follows with cardiology for history of diastolic dysfunction, HTN, HLD, paroxysmal atrial  fibrillation/flutter on warfarin, venous insufficiency. Admitted 07/28/2023 after a fall with subsequent SDH and seizure.  Warfarin held and INR reversed, seizure treated with Keppra .  Echo 07/29/2023 LVEF 60-65%, PASP 52, moderate MR, moderate TR. Discharged 07/31/2023 to SNF back on warfarin. Admitted again 10/12/2023 after another fall with fracture of left femur. ORIF 10/15/2023. Noted to back in a fib with RVR during hospitalization.  Treated with diltiazem , metoprolol , and continued warfarin.  Discharged back to [facility] 10/20/2023.  Last seen in cardiology follow-up by Deitra Cleaves, NP on 01/10/2024.  Upcoming surgery discussed.  Per note, Surgery not yet scheduled, but needs to be done urgently per notes.  Lumpectomy and lymph node biopsy generally low risk surgery.  RCRI shows low (0.4%) perioperative risk of major cardiac event.  METS difficult to assess given functional disability.  However, she has been increasing her activity slowly and denies cardiac symptoms. She is in AFL that is rate-controlled today, but this is not new for her. She is taking Coumadin  as directed by the Coumadin  clinic as above.  - Recommend continuous telemetry monitoring during procedure, keep K > 4 and Mg > 2 during perioperative period - No ischemic evaluation necessary prior to procedure unless she develops new symptoms - Patient can hold warfarin for 5 days prior to procedure. Patient will not need bridging with Lovenox  (enoxaparin ) around procedure.  Per PharmD.  Other pertinent history includes  GERD on PPI, mild dementia, CKD 3.  Patient reports last dose Coumadin  02/11/2024.  Preop labs reviewed, mild hyperkalemia potassium 5.4, creatinine 1.73 consistent with history of CKD, mild anemia hemoglobin 11.3.  EKG 01/10/2024: Atrial flutter with variable AV block.  Ventricular rate 80.  TTE 07/29/2023: 1. Left ventricular ejection fraction, by estimation, is 60 to 65%. The  left ventricle has normal function. The left ventricle  has no regional  wall motion abnormalities. Left ventricular diastolic parameters are  indeterminate.  2. Right ventricular systolic function is normal. The right ventricular  size is mildly enlarged. There is moderately elevated pulmonary artery  systolic pressure. The estimated right ventricular systolic pressure is  52.5 mmHg.  3. Left atrial size was moderately dilated.  4. Right atrial size was moderately dilated.  5. The mitral valve is degenerative. Moderate mitral valve regurgitation.  No evidence of mitral stenosis.  6. Tricuspid valve regurgitation is moderate.  7. The aortic valve is tricuspid. Aortic valve regurgitation is trivial.  No aortic stenosis is present.  8. The inferior vena cava is dilated in size with <50% respiratory  variability, suggesting right atrial pressure of 15 mmHg.    )         Anesthesia Quick Evaluation  "

## 2024-02-10 ENCOUNTER — Ambulatory Visit (INDEPENDENT_AMBULATORY_CARE_PROVIDER_SITE_OTHER): Payer: Self-pay | Admitting: Cardiology

## 2024-02-10 DIAGNOSIS — Z5181 Encounter for therapeutic drug level monitoring: Secondary | ICD-10-CM | POA: Diagnosis not present

## 2024-02-10 DIAGNOSIS — I480 Paroxysmal atrial fibrillation: Secondary | ICD-10-CM | POA: Diagnosis not present

## 2024-02-10 LAB — POCT INR: INR: 1.8 — AB (ref 2.0–3.0)

## 2024-02-10 NOTE — Patient Instructions (Signed)
 Description   - Spoke with  Leita from [facility] and instructed for pt to:  -Take 3mg  of warfarin today -Then continue warfarin 1 mg daily except for 2mg  on Tuesday, Thursday and Saturday.  -Lumpectomy on 1/29- Hold warfarin 1/24-1/28: no lovenox , resume warfarin the evening of the procedure (1/29 )unless the Dr says otherwise.  -Recheck INR 1 week post procedure  Coumadin  Clinic [phone]

## 2024-02-10 NOTE — Progress Notes (Signed)
 Lab Results  Component Value Date   INR 1.8 (A) 02/10/2024   INR 2.5 01/26/2024   INR 4.2 (A) 01/19/2024    Description   - Spoke with  Leita from [facility] and instructed for pt to:  -Take 3mg  of warfarin today -Then continue warfarin 1 mg daily except for 2mg  on Tuesday, Thursday and Saturday.  -Lumpectomy on 1/29- Hold warfarin 1/24-1/28: no lovenox , resume warfarin the evening of the procedure (1/29 )unless the Dr says otherwise.  -Recheck INR 1 week post procedure  Coumadin  Clinic [phone]

## 2024-02-15 NOTE — H&P (Signed)
 "  PROVIDER: JINA CLAIR NEPHEW, MD Patient Care Team: Caleen Dirks, MD as PCP - General (Internal Medicine) Nephew Jina Clair, MD as Consulting Provider (Surgical Oncology) Lanny Callander, MD (Hematology and Oncology) Shannon Lynwood BIRCH, MD (Radiation Oncology) Jama Dorothe RAMAN, MD (Radiology) Perla Evalene Rush, MD (Cardiovascular Disease) Edna Toribio LABOR, MD (Orthopedic Surgery)  MRN: [id number] DOB: December 08, 1940 DATE OF ENCOUNTER: 01/04/2024  Chief Complaint: Follow-up  History of Present Illness: Brandi [name] is a 84 y.o. female who is seen today for breast cancer follow up.  Initial history:   Patient presented with a new diagnosis of right breast cancer April 2025. Patient was noted to have a screening detected right breast mass. Upon diagnostic imaging, it was noted to be palpable and breaking through the skin. There was a 2-1/2 to 3 cm mass at 9-10 o'clock centrally causing nipple retraction. There also was a second mass in the upper outer quadrant. At 11:00 that is 7 mm. There was an indeterminant lymph node with cortical thickening measuring 4.3 mm. Additionally, there was some asymmetry in the medial breast.   Core needle biopsies were performed. The medial breast asymmetry was benign. The masses were both noted to be moderately differentiated invasive ductal adenocarcinoma. Angiolymphatic invasion was present. These were both strongly ER and PR positive, HER2 negative, Ki-67 was 5%.  Of note, the patient is on anticoagulation for paroxysmal atrial fibrillation. She also was living in a nursing facility following several falls and surgery. The patient has mild dementia, but was certainly able to do her activities of daily living and carry on a conversation.She was just forgetful about some of the more fine details. She came into clinic in a wheelchair, but does walk at the facility where she is out and has been working with physical therapy.   Interval history:   Our initial  treatment strategy was neoadjuvant anastrozole . Patient was a bit frail we were concerned about tolerance for anesthesia. 73-month follow-up in August, the mass was getting of little smaller. However on 59-month follow-up in December the upper outer quadrant mass had grown in size and there was a new mass adjacent to that. They also noted that the right axillary lymph node was much larger.  Incidentally, she also fell and broke her left distal femur in September. She required admission with ORIF and then went to rehab after surgery.  History of Present Illness Brandi [name] is an 84 year old female with breast cancer who presents for surgical consultation regarding new cancerous spots. She was referred by Dr. Vonzell for evaluation of a new cancerous spot and enlarging lymph node.  She is currently taking anastrozole . She has a history of atrial fibrillation and is on Coumadin . She has no history of Lovenox  injections.  The cold weather has been affecting her mobility. She is able to stand but cannot walk far due to a previous leg fracture. She continues to attend physical therapy and manages her pain with Tylenol .  Review of Systems: A complete review of systems was obtained from the patient. I have reviewed this information and discussed as appropriate with the patient. See HPI as well for other ROS.  ROS   Medical History: Past Medical History:  Diagnosis Date  Anxiety  Arthritis  Hyperlipidemia  Hypertension   Patient Active Problem List  Diagnosis  Abnormal EKG  Acute metabolic encephalopathy  Acute renal failure ()  Acute respiratory failure with hypoxia (CMS/HHS-HCC)  Atrial flutter (CMS/HHS-HCC)  CAP (community acquired pneumonia)  Chest pain  with moderate risk for cardiac etiology  Asymptomatic bacteriuria  Troponin level elevated  Secondary hypercoagulable state (HHS-HCC)  Paroxysmal atrial fibrillation (CMS/HHS-HCC)  Normocytic anemia  Mixed hyperlipidemia  Mild  obesity, unspecified  Vasovagal episode  Malignant neoplasm of overlapping sites of right breast in female, estrogen receptor positive (CMS/HHS-HCC)  Long term (current) use of anticoagulants  Hypokalemia  Falls frequently  Essential hypertension  Depression  Generalized weakness  Nasal congestion  Weight loss  Leg swelling  History of bruising easily  Tendency toward bleeding easily ()  On warfarin at home  Closed fracture of distal end of left femur (CMS-HCC)  Breast cancer metastasized to axillary lymph node, right (CMS/HHS-HCC)   Past Surgical History:  Procedure Laterality Date  INTRAMEDULLARY (IM) NAIL INTERTROCHANTERIC (Left) Left 11/11/2022  ankle sx Left  Date unknown  knee sx Left  Date unknown    Allergies  Allergen Reactions  Amoxicillin Diarrhea  Celecoxib Other (See Comments)  Bruising and stomach pain   Current Outpatient Medications on File Prior to Visit  Medication Sig Dispense Refill  acetaminophen  (TYLENOL ) 500 MG tablet Take 1,000 mg by mouth every 6 (six) hours as needed  azelastine  (ASTELIN ) 137 mcg nasal spray Place 2 sprays into both nostrils 2 (two) times daily as needed for Rhinitis  cefpodoxime (VANTIN) 200 MG tablet Take 200 mg by mouth once daily  cetirizine (ZYRTEC) 10 MG tablet Take 10 mg by mouth once daily as needed  clotrimazole-betamethasone (LOTRISONE) 1-0.05 % cream Apply 1 Application topically as directed (Apply 1 Application topically as needed (rash).)  dilTIAZem  (CARDIZEM  SR) 60 MG 12 hr capsule Take 60 mg by mouth 2 (two) times daily  ferrous sulfate  325 (65 FE) MG tablet Take 325 mg by mouth daily with breakfast  guanFACINE  (TENEX ) 2 MG tablet Take 2 mg by mouth every evening  lidocaine  HCL 4 % lqro Apply 1 Application topically 2 (two) times daily as needed ((knee pain).)  pantoprazole  (PROTONIX ) 40 MG DR tablet Take 40 mg by mouth once daily  rosuvastatin  (CRESTOR ) 10 MG tablet Take 10 mg by mouth as directed (TAKE 1 TABLET  AT BEDTIME 2 TIMES A WEEK)  sertraline  (ZOLOFT ) 50 MG tablet Take 75 mg by mouth at bedtime  spironolactone  (ALDACTONE ) 25 MG tablet Take 25 mg by mouth once daily  warfarin (COUMADIN ) 2 MG tablet Take 1-2 mg by mouth as directed (TAKE 1/2 TO 1 TABLET (1 TO 2MG ) DAILY OR AS DIRECTED BY ANTICOAGULATION CLINIC)   No current facility-administered medications on file prior to visit.   Family History  Problem Relation Age of Onset  Coronary Artery Disease (Blocked arteries around heart) Father    Social History   Tobacco Use  Smoking Status Never  Smokeless Tobacco Never    Social History   Socioeconomic History  Marital status: Married  Tobacco Use  Smoking status: Never  Smokeless tobacco: Never  Vaping Use  Vaping status: Never Used  Substance and Sexual Activity  Alcohol  use: Never  Drug use: Never   Social Drivers of Health   Food Insecurity: Patient Unable To Answer (10/13/2023)  Received from [facility]  Hunger Vital Sign  Within the past 12 months, you worried that your food would run out before you got the money to buy more.: Patient unable to answer  Within the past 12 months, the food you bought just didn't last and you didn't have money to get more.: Patient unable to answer  Transportation Needs: Unknown (10/13/2023)  Received from [facility]  PRAPARE - Transportation  In the past 12 months, has lack of transportation kept you from meetings, work, or from getting things needed for daily living?: Patient unable to answer  Social Connections: Patient Unable To Answer (10/13/2023)  Received from [facility]  Social Connection and Isolation Panel  In a typical week, how many times do you talk on the phone with family, friends, or neighbors?: Patient unable to answer  How often do you get together with friends or relatives?: Patient unable to answer  How often do you attend church or religious services?: Patient unable to answer  Do you belong to any clubs or  organizations such as church groups, unions, fraternal or athletic groups, or school groups?: Patient unable to answer  How often do you attend meetings of the clubs or organizations you belong to?: Patient unable to answer  Are you married, widowed, divorced, separated, never married, or living with a partner?: Patient unable to answer  Housing Stability: Unknown (01/04/2024)  Housing Stability Vital Sign  Homeless in the Last Year: No   Objective:   Vitals:  01/04/24 1521 01/04/24 1522  BP: 120/74  SpO2: 98%  Weight: 53.5 kg (118 lb)  Height: 149.9 cm (4' 11)  PainSc: 0-No pain   Body mass index is 23.83 kg/m.  Head: Normocephalic and atraumatic.  Eyes: Conjunctivae are normal. Pupils are equal, round, and reactive to light. No scleral icterus.  Neck: Normal range of motion. Neck supple. No tracheal deviation present. No thyromegaly present.  Resp: No respiratory distress, normal effort. Breast: There is still a palpable mass on the lateral aspect of the nipple areolar complex on the right. The skin overlying this is healed. Still feels around 3 cm. There is a palpable lymph node in the axilla that is fairly anterior. No other masses are appreciated. There is nipple deviation due to the mass behind the nipple. No nipple discharge is present. There is contour change at this region, but no other contour change on the right. Left breast is benign. Abd: Abdomen is soft, non distended and non tender. No masses are palpable. There is no rebound and no guarding.  Neurological: Alert and oriented to person, place, and time. Coordination normal.  Skin: Skin is warm and dry. No rash noted. No diaphoretic. No erythema. No pallor.  Psychiatric: Normal mood and affect. Normal behavior. Judgment and thought content normal.   Labs, Imaging and Diagnostic Testing:  12/28/23 CBC with hematocrit of 30.7, otherwise essentially normal CMET with creatinine of 1.64 and GFR of 31 otherwise essentially  normal     Assessment and Plan:   Diagnoses and all orders for this visit: Malignant neoplasm of overlapping sites of right breast in female, estrogen receptor positive (CMS/HHS-HCC) Breast cancer metastasized to axillary lymph node, right (CMS/HHS-HCC) Falls frequently Paroxysmal atrial fibrillation (CMS/HHS-HCC) On warfarin at home    Assessment & Plan Malignant neoplasm of overlapping sites of right breast with lymph node involvement, estrogen receptor positive Right breast cancer progression with axillary lymph node enlargement and new lesion. Surgery recommended to excise cancerous areas and lymph nodes. Lumpectomy with nipple removal, seed bracketed lumpectomy for the 2 smaller sites in the upper outer quadrant, seed targeted axillary lymph node excision, and sentinel lymph node biopsy planned.  - Risks include bleeding, infection, seroma, lymphedema, and cardiac complications due to atrial fibrillation. Anesthesia block for post-operative pain. Coumadin  held pre-operatively, potential Lovenox  use if required by cardiology. Breast size reduction expected post-surgery. - Continue anastrozole  therapy. -  Schedule surgery - Coordinate with cardiology for Coumadin  management pre-operatively. - Arrange pre-operative appointments and seed placement for surgical guidance. - Advise wearing a supportive bra post-operatively. - Discuss potential need for Lovenox  injections if cardiology deems necessary.  Paroxysmal atrial fibrillation Risk of atrial fibrillation recurrence during surgery, potentially requiring hospitalization if unresolved in recovery. Cardiology to guide Coumadin  management pre-operatively. - Consult cardiology for pre-operative management of atrial fibrillation and Coumadin . - Monitor for atrial fibrillation during and after surgery.  "

## 2024-02-17 ENCOUNTER — Encounter (HOSPITAL_COMMUNITY): Admitting: Anesthesiology

## 2024-02-17 ENCOUNTER — Other Ambulatory Visit: Payer: Self-pay

## 2024-02-17 ENCOUNTER — Encounter (HOSPITAL_COMMUNITY)
Admission: RE | Disposition: A | Discharge status: Home / Self Care | Payer: Self-pay | Source: Ambulatory Visit | Attending: General Surgery

## 2024-02-17 ENCOUNTER — Ambulatory Visit (HOSPITAL_COMMUNITY): Payer: Self-pay | Admitting: Physician Assistant

## 2024-02-17 ENCOUNTER — Ambulatory Visit (HOSPITAL_COMMUNITY)
Admission: RE | Admit: 2024-02-17 | Discharge: 2024-02-17 | Disposition: A | Discharge status: Home / Self Care | Source: Ambulatory Visit | Attending: General Surgery | Admitting: General Surgery

## 2024-02-17 ENCOUNTER — Encounter (HOSPITAL_COMMUNITY): Payer: Self-pay | Admitting: General Surgery

## 2024-02-17 DIAGNOSIS — I10 Essential (primary) hypertension: Secondary | ICD-10-CM | POA: Insufficient documentation

## 2024-02-17 DIAGNOSIS — J45909 Unspecified asthma, uncomplicated: Secondary | ICD-10-CM | POA: Diagnosis not present

## 2024-02-17 DIAGNOSIS — M1990 Unspecified osteoarthritis, unspecified site: Secondary | ICD-10-CM | POA: Insufficient documentation

## 2024-02-17 DIAGNOSIS — Z1721 Progesterone receptor positive status: Secondary | ICD-10-CM | POA: Insufficient documentation

## 2024-02-17 DIAGNOSIS — Z1732 Human epidermal growth factor receptor 2 negative status: Secondary | ICD-10-CM | POA: Insufficient documentation

## 2024-02-17 DIAGNOSIS — Z7901 Long term (current) use of anticoagulants: Secondary | ICD-10-CM | POA: Insufficient documentation

## 2024-02-17 DIAGNOSIS — C773 Secondary and unspecified malignant neoplasm of axilla and upper limb lymph nodes: Secondary | ICD-10-CM | POA: Insufficient documentation

## 2024-02-17 DIAGNOSIS — I480 Paroxysmal atrial fibrillation: Secondary | ICD-10-CM | POA: Insufficient documentation

## 2024-02-17 DIAGNOSIS — C50911 Malignant neoplasm of unspecified site of right female breast: Secondary | ICD-10-CM | POA: Diagnosis not present

## 2024-02-17 DIAGNOSIS — D6869 Other thrombophilia: Secondary | ICD-10-CM | POA: Insufficient documentation

## 2024-02-17 DIAGNOSIS — I4891 Unspecified atrial fibrillation: Secondary | ICD-10-CM | POA: Diagnosis not present

## 2024-02-17 DIAGNOSIS — Z170 Estrogen receptor positive status [ER+]: Secondary | ICD-10-CM | POA: Insufficient documentation

## 2024-02-17 DIAGNOSIS — F03A3 Unspecified dementia, mild, with mood disturbance: Secondary | ICD-10-CM | POA: Insufficient documentation

## 2024-02-17 DIAGNOSIS — C50411 Malignant neoplasm of upper-outer quadrant of right female breast: Secondary | ICD-10-CM | POA: Insufficient documentation

## 2024-02-17 DIAGNOSIS — C50011 Malignant neoplasm of nipple and areola, right female breast: Secondary | ICD-10-CM | POA: Insufficient documentation

## 2024-02-17 DIAGNOSIS — M797 Fibromyalgia: Secondary | ICD-10-CM | POA: Insufficient documentation

## 2024-02-17 LAB — POCT I-STAT, CHEM 8
BUN: 43 mg/dL — ABNORMAL HIGH (ref 8–23)
BUN: 45 mg/dL — ABNORMAL HIGH (ref 8–23)
Calcium, Ion: 1.14 mmol/L — ABNORMAL LOW (ref 1.15–1.40)
Calcium, Ion: 1.31 mmol/L (ref 1.15–1.40)
Chloride: 111 mmol/L (ref 98–111)
Chloride: 108 mmol/L (ref 98–111)
Creatinine, Ser: 1.9 mg/dL — ABNORMAL HIGH (ref 0.44–1.00)
Creatinine, Ser: 1.9 mg/dL — ABNORMAL HIGH (ref 0.44–1.00)
Glucose, Bld: 90 mg/dL (ref 70–99)
Glucose, Bld: 83 mg/dL (ref 70–99)
HCT: 33 % — ABNORMAL LOW (ref 36.0–46.0)
HCT: 40 % (ref 36.0–46.0)
Hemoglobin: 11.2 g/dL — ABNORMAL LOW (ref 12.0–15.0)
Hemoglobin: 13.6 g/dL (ref 12.0–15.0)
Potassium: 5.8 mmol/L — ABNORMAL HIGH (ref 3.5–5.1)
Potassium: 6.1 mmol/L — ABNORMAL HIGH (ref 3.5–5.1)
Sodium: 136 mmol/L (ref 135–145)
Sodium: 138 mmol/L (ref 135–145)
TCO2: 20 mmol/L — ABNORMAL LOW (ref 22–32)
TCO2: 22 mmol/L (ref 22–32)

## 2024-02-17 LAB — PROTIME-INR
INR: 1.3 — ABNORMAL HIGH (ref 0.8–1.2)
Prothrombin Time: 16.5 s — ABNORMAL HIGH (ref 11.4–15.2)

## 2024-02-17 MED ORDER — CIPROFLOXACIN IN D5W 400 MG/200ML IV SOLN
400.0000 mg | INTRAVENOUS | Status: AC
  Administered 2024-02-17: 400 mg via INTRAVENOUS
  Filled 2024-02-17: qty 200

## 2024-02-17 MED ORDER — FENTANYL CITRATE (PF) 100 MCG/2ML IJ SOLN: 25.0000 ug | INTRAMUSCULAR | Status: DC | PRN

## 2024-02-17 MED ORDER — PROPOFOL 10 MG/ML IV BOLUS
INTRAVENOUS | Status: DC | PRN
  Administered 2024-02-17: 130 mg via INTRAVENOUS

## 2024-02-17 MED ORDER — PHENYLEPHRINE HCL (PRESSORS) 10 MG/ML IV SOLN
INTRAVENOUS | Status: AC
  Filled 2024-02-17: qty 1

## 2024-02-17 MED ORDER — LIDOCAINE 2% (20 MG/ML) 5 ML SYRINGE
INTRAMUSCULAR | Status: DC | PRN
  Administered 2024-02-17: 40 mg via INTRAVENOUS

## 2024-02-17 MED ORDER — MAGTRACE LYMPHATIC TRACER
INTRAMUSCULAR | Status: DC | PRN
  Administered 2024-02-17: 2 mL via INTRAMUSCULAR

## 2024-02-17 MED ORDER — DEXAMETHASONE SOD PHOSPHATE PF 10 MG/ML IJ SOLN
INTRAMUSCULAR | Status: AC
  Filled 2024-02-17: qty 1

## 2024-02-17 MED ORDER — TRAMADOL HCL 50 MG PO TABS: 50.0000 mg | ORAL_TABLET | Freq: Four times a day (QID) | ORAL | 0 refills | Status: AC | PRN

## 2024-02-17 MED ORDER — 0.9 % SODIUM CHLORIDE (POUR BTL) OPTIME
TOPICAL | Status: DC | PRN
  Administered 2024-02-17: 1000 mL

## 2024-02-17 MED ORDER — DEXAMETHASONE SOD PHOSPHATE PF 10 MG/ML IJ SOLN
INTRAMUSCULAR | Status: DC | PRN
  Administered 2024-02-17: 5 mg via INTRAVENOUS

## 2024-02-17 MED ORDER — FENTANYL CITRATE (PF) 100 MCG/2ML IJ SOLN
INTRAMUSCULAR | Status: AC
  Filled 2024-02-17: qty 2

## 2024-02-17 MED ORDER — PHENYLEPHRINE 80 MCG/ML (10ML) SYRINGE FOR IV PUSH (FOR BLOOD PRESSURE SUPPORT)
PREFILLED_SYRINGE | INTRAVENOUS | Status: DC | PRN
  Administered 2024-02-17 (×3): 160 ug via INTRAVENOUS
  Administered 2024-02-17: 80 ug via INTRAVENOUS

## 2024-02-17 MED ORDER — CHLORHEXIDINE GLUCONATE CLOTH 2 % EX PADS: 6.0000 | MEDICATED_PAD | Freq: Once | CUTANEOUS | Status: DC

## 2024-02-17 MED ORDER — ONDANSETRON HCL 4 MG/2ML IJ SOLN: 4.0000 mg | Freq: Once | INTRAMUSCULAR | Status: DC | PRN

## 2024-02-17 MED ORDER — LACTATED RINGERS IV SOLN: INTRAVENOUS | Status: DC

## 2024-02-17 MED ORDER — ONDANSETRON HCL 4 MG/2ML IJ SOLN
INTRAMUSCULAR | Status: DC | PRN
  Administered 2024-02-17: 4 mg via INTRAVENOUS

## 2024-02-17 MED ORDER — PROPOFOL 10 MG/ML IV BOLUS
INTRAVENOUS | Status: AC
  Filled 2024-02-17: qty 20

## 2024-02-17 MED ORDER — ONDANSETRON HCL 4 MG/2ML IJ SOLN
INTRAMUSCULAR | Status: AC
  Filled 2024-02-17: qty 2

## 2024-02-17 MED ORDER — LIDOCAINE 2% (20 MG/ML) 5 ML SYRINGE
INTRAMUSCULAR | Status: AC
  Filled 2024-02-17: qty 5

## 2024-02-17 MED ORDER — FENTANYL CITRATE (PF) 100 MCG/2ML IJ SOLN: 50.0000 ug | Freq: Once | INTRAMUSCULAR | Status: AC

## 2024-02-17 MED ORDER — ACETAMINOPHEN 500 MG PO TABS
1000.0000 mg | ORAL_TABLET | ORAL | Status: AC
  Administered 2024-02-17: 1000 mg via ORAL
  Filled 2024-02-17: qty 2

## 2024-02-17 MED ORDER — FENTANYL CITRATE (PF) 100 MCG/2ML IJ SOLN
INTRAMUSCULAR | Status: AC
  Administered 2024-02-17: 50 ug via INTRAVENOUS
  Filled 2024-02-17: qty 2

## 2024-02-17 MED ORDER — BUPIVACAINE-EPINEPHRINE (PF) 0.25% -1:200000 IJ SOLN
INTRAMUSCULAR | Status: AC
  Filled 2024-02-17: qty 30

## 2024-02-17 MED ORDER — FENTANYL CITRATE (PF) 250 MCG/5ML IJ SOLN
INTRAMUSCULAR | Status: DC | PRN
  Administered 2024-02-17: 50 ug via INTRAVENOUS

## 2024-02-17 MED ORDER — LIDOCAINE HCL (PF) 1 % IJ SOLN
INTRAMUSCULAR | Status: AC
  Filled 2024-02-17: qty 30

## 2024-02-17 MED ORDER — BUPIVACAINE-EPINEPHRINE (PF) 0.5% -1:200000 IJ SOLN
INTRAMUSCULAR | Status: DC | PRN
  Administered 2024-02-17: 30 mL via PERINEURAL

## 2024-02-17 MED ORDER — ORAL CARE MOUTH RINSE: 15.0000 mL | Freq: Once | OROMUCOSAL | Status: AC

## 2024-02-17 MED ORDER — PHENYLEPHRINE HCL-NACL 20-0.9 MG/250ML-% IV SOLN
INTRAVENOUS | Status: DC | PRN
  Administered 2024-02-17: 40 ug/min via INTRAVENOUS

## 2024-02-17 MED ORDER — CHLORHEXIDINE GLUCONATE 0.12 % MT SOLN
15.0000 mL | Freq: Once | OROMUCOSAL | Status: AC
  Administered 2024-02-17: 15 mL via OROMUCOSAL
  Filled 2024-02-17: qty 15

## 2024-02-17 MED ORDER — LIDOCAINE HCL 1 % IJ SOLN
INTRAMUSCULAR | Status: DC | PRN
  Administered 2024-02-17: 10 mL

## 2024-02-17 NOTE — Anesthesia Procedure Notes (Signed)
 Procedure Name: LMA Insertion Date/Time: 02/17/2024 9:51 AM  Performed by: Erick Fitz, CRNAPre-anesthesia Checklist: Patient identified, Emergency Drugs available, Suction available, Patient being monitored and Timeout performed Patient Re-evaluated:Patient Re-evaluated prior to induction Oxygen Delivery Method: Circle system utilized Preoxygenation: Pre-oxygenation with 100% oxygen Induction Type: IV induction Ventilation: Mask ventilation without difficulty LMA: LMA inserted LMA Size: 3.0 Number of attempts: 1 Placement Confirmation: positive ETCO2, CO2 detector and breath sounds checked- equal and bilateral Tube secured with: Tape (secured with pink Hy-tape) Dental Injury: Teeth and Oropharynx as per pre-operative assessment

## 2024-02-17 NOTE — Discharge Instructions (Addendum)
 [facility] Office Phone Number [phone]  BREAST BIOPSY/ PARTIAL MASTECTOMY: POST OP INSTRUCTIONS  Always review your discharge instruction sheet given to you by the facility where your surgery was performed.  IF YOU HAVE DISABILITY OR FAMILY LEAVE FORMS, YOU MUST BRING THEM TO THE OFFICE FOR PROCESSING.  DO NOT GIVE THEM TO YOUR DOCTOR.  Take 2 tylenol  (acetominophen) three times a day for 3 days.  If you still have pain, add ibuprofen with food in between if able to take this (if you have kidney issues or stomach issues, do not take ibuprofen).  If both of those are not enough, add the narcotic pain pill.  If you find you are needing a lot of this overnight after surgery, call the next morning for a refill.    Prescriptions will not be filled after 5pm or on week-ends. Take your usually prescribed medications unless otherwise directed You should eat very light the first 24 hours after surgery, such as soup, crackers, pudding, etc.  Resume your normal diet the day after surgery. Most patients will experience some swelling and bruising in the breast.  Ice packs and a good support bra will help.  Swelling and bruising can take several days to resolve.  It is common to experience some constipation if taking pain medication after surgery.  Increasing fluid intake and taking a stool softener will usually help or prevent this problem from occurring.  A mild laxative (Milk of Magnesia or Miralax ) should be taken according to package directions if there are no bowel movements after 48 hours. Unless discharge instructions indicate otherwise, you may remove your bandages 48 hours after surgery, and you may shower at that time.  You may have steri-strips (small skin tapes) in place directly over the incision.  These strips should be left on the skin at least for for 7-10 days.    ACTIVITIES:  You may resume regular daily activities (gradually increasing) beginning the next day.  Wearing a  good support bra or sports bra (or the breast binder) minimizes pain and swelling.  You may have sexual intercourse when it is comfortable. No heavy lifting for 1-2 weeks (not over around 10 pounds).  You may drive when you no longer are taking prescription pain medication, you can comfortably wear a seatbelt, and you can safely maneuver your car and apply brakes. RETURN TO WORK:  __________3-14 days depending on job. _______________ Brandi [name] should see your doctor in the office for a follow-up appointment approximately two weeks after your surgery.  Your doctor's nurse will typically make your follow-up appointment when she calls you with your pathology report.  Expect your pathology report 3-4 business days after your surgery.  You may call to check if you do not hear from us  after three days.   WHEN TO CALL YOUR DOCTOR: Fever over 101.0 Nausea and/or vomiting. Extreme swelling or bruising. Continued bleeding from incision. Increased pain, redness, or drainage from the incision.  The clinic staff is available to answer your questions during regular business hours.  Please don't hesitate to call and ask to speak to one of the nurses for clinical concerns.  If you have a medical emergency, go to the nearest emergency room or call 911.  A surgeon from [facility] is always on call at the hospital.  For further questions, please visit [email or web address]

## 2024-02-17 NOTE — Op Note (Signed)
 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 The risks, benefits, complications, treatment options, and expected outcomes were discussed with the patient. The possibilities of bleeding, infection, the need for additional procedures, failure to diagnose a condition, and creating a complication requiring transfusion or operation were discussed with the patient. The patient concurred with the proposed plan, giving informed consent.  The site of surgery properly noted/marked. The patient was taken to Operating Room # 2, identified, and the procedure verified as right breast Seed localized Lumpectomy x 2 with right axillary seed localized excisional lymph node biopsy, and with sentinel lymph node biopsy. The right arm, breast, and chest were prepped and draped in standard fashion. A Time Out was held and the above information confirmed.   The lumpectomy was performed by creating an elliptical incision incorporating the nipple and part of the areola along with the adjacent skin where the cancer has been.  This was near the previously placed radioactive seed.  Dissection was carried down to around the point of maximum signal intensity. The cautery was used to perform the dissection.  Hemostasis was achieved with cautery.  The specimen was inked with the margin marker paint kit.    Specimen  radiography confirmed inclusion of the mammographic lesion, the clip, and the seed.  Skin hooks were then used to elevate the superior flap of the incision and the second Magseed was located.  The lumpectomy was taken with the cautery.  This was similarly marked with the margin marker paint kit and a specimen radiograph was taken showing the seed and the clip.  The background signal in the breast was zero.   The wound was irrigated and reinspected for hemostasis.  The skin was then closed with 3-0 vicryl in layers and 4-0 monocryl subcuticular suture.     An oblique incision was created below the axillary hairline.  Dissection was carried through the clavipectoral fascia.  The seed containing lymph node was identified first.  This was elevated with a Babcock and a medium clips were used to ligate the lymphovascular channels going to the node.  After this was removed, the MagTrace was injected into the breast tissue and massaged for 5 minutes.. One additional deep level 2 axillary sentinel nodes were removed.  Counts per second were low.    The background count was negative cps.  The wound was irrigated.  Hemostasis was achieved with cautery.  The axillary incision was closed with a 3-0 vicryl deep dermal interrupted sutures and a 4-0 monocryl subcuticular closure.    Sterile dressings were applied. At the end of the operation, all sponge, instrument, and needle counts were correct.  Findings: grossly clear surgical margins and no adenopathy, anterior margin of the skin.  The second smaller lumpectomy inferior margin is contiguous with the superior margin of the first specimen.   Estimated Blood Loss:  min         Specimens: Right breast tissue with seed incorporating the nipple, right upper  outer quadrant lumpectomy with seed, right sentinel node containing seed, right axillary sentinel node #2         Complications:  None; patient tolerated the procedure well.         Disposition: PACU -  hemodynamically stable.         Condition: stable

## 2024-02-17 NOTE — Anesthesia Procedure Notes (Signed)
 Anesthesia Regional Block: Pectoralis block   Pre-Anesthetic Checklist: , timeout performed,  Correct Patient, Correct Site, Correct Laterality,  Correct Procedure, Correct Position, site marked,  Risks and benefits discussed,  Surgical consent,  Pre-op evaluation,  At surgeon's request and post-op pain management  Laterality: Right  Prep: chloraprep       Needles:  Injection technique: Single-shot  Needle Type: Echogenic Needle     Needle Length: 9cm  Needle Gauge: 21     Additional Needles:   Procedures:,,,, ultrasound used (permanent image in chart),,    Narrative:  Start time: 02/17/2024 9:05 AM End time: 02/17/2024 9:11 AM Injection made incrementally with aspirations every 5 mL.  Performed by: Personally  Anesthesiologist: Corinne Garnette BRAVO, MD  Additional Notes: No pain on injection. No increased resistance to injection. Injection made in 5cc increments.  Good needle visualization.  Patient tolerated procedure well.

## 2024-02-17 NOTE — Interval H&P Note (Signed)
 History and Physical Interval Note:  02/17/2024 9:21 AM  Brandi [name]  has presented today for surgery, with the diagnosis of RIGHT BREAST CANCER.  The various methods of treatment have been discussed with the patient and family. After consideration of risks, benefits and other options for treatment, the patient has consented to  Procedures with comments: LUMPECTOMY, lumpectomy WITH MAGNETIC MARKER LOCALIZATION (Right), BIOPSY, LYMPH NODE, SENTINEL (Right) as a surgical intervention.  The patient's history has been reviewed, patient examined, no change in status, stable for surgery.  I have reviewed the patient's chart and labs.  Questions were answered to the patient's satisfaction.     [name]

## 2024-02-17 NOTE — Anesthesia Postprocedure Evaluation (Signed)
"   Anesthesia Post Note  Patient: Brandi [name]  Procedure(s) Performed: RIGHT BREAST SEED BRACKETED LUMPECTOMY MAGNETIC MARKER LOCALIZATION (Right: Breast) RIGHT SENTINEL NODE BIOPSY (Right: Axilla) RIGHT SEED TARGETED AXILLARY LYMPH NODE EXCISION (Right: Axilla) RIGHT BREAST LUMPECTOMY (Right: Breast)     Patient location during evaluation: PACU Anesthesia Type: General Level of consciousness: awake and alert Pain management: pain level controlled Vital Signs Assessment: post-procedure vital signs reviewed and stable Respiratory status: spontaneous breathing, nonlabored ventilation and respiratory function stable Cardiovascular status: blood pressure returned to baseline and stable Postop Assessment: no apparent nausea or vomiting Anesthetic complications: no   There were no known notable events for this encounter.  Last Vitals:  Vitals:   02/17/24 1215 02/17/24 1230  BP: 127/79 126/79  Pulse: 79 68  Resp: 17 13  Temp:  36.4 C  SpO2: 94% 94%    Last Pain:  Vitals:   02/17/24 1200  TempSrc:   PainSc: 6                  Garnette FORBES Skillern      "

## 2024-02-17 NOTE — Transfer of Care (Signed)
 Immediate Anesthesia Transfer of Care Note  Patient: Brandi [name]  Procedure(s) Performed: RIGHT BREAST SEED BRACKETED LUMPECTOMY MAGNETIC MARKER LOCALIZATION (Right: Breast) RIGHT SENTINEL NODE BIOPSY (Right: Axilla) RIGHT SEED TARGETED AXILLARY LYMPH NODE EXCISION (Right: Axilla) RIGHT BREAST LUMPECTOMY (Right: Breast)  Patient Location: PACU  Anesthesia Type:General  Level of Consciousness: awake, alert , oriented, and patient cooperative  Airway & Oxygen Therapy: Patient Spontanous Breathing and Patient connected to face mask oxygen  Post-op Assessment: Report given to RN and Post -op Vital signs reviewed and stable  Post vital signs: Reviewed and stable  Last Vitals:  Vitals Value Taken Time  BP 138/78 02/17/24 12:00  Temp 36.4 C 02/17/24 12:00  Pulse 90 02/17/24 12:03  Resp 17 02/17/24 12:04  SpO2 100 % 02/17/24 12:03  Vitals shown include unfiled device data.  Last Pain:  Vitals:   02/17/24 0916  TempSrc:   PainSc: 0-No pain         Complications: There were no known notable events for this encounter.

## 2024-02-18 ENCOUNTER — Encounter (HOSPITAL_COMMUNITY): Payer: Self-pay | Admitting: General Surgery

## 2024-02-22 ENCOUNTER — Inpatient Hospital Stay: Admitting: Hematology

## 2024-02-22 ENCOUNTER — Inpatient Hospital Stay: Attending: Nurse Practitioner

## 2024-02-22 DIAGNOSIS — Z170 Estrogen receptor positive status [ER+]: Secondary | ICD-10-CM

## 2024-02-22 LAB — CBC WITH DIFFERENTIAL (CANCER CENTER ONLY)
Abs Immature Granulocytes: 0.04 10*3/uL (ref 0.00–0.07)
Basophils Absolute: 0 10*3/uL (ref 0.0–0.1)
Basophils Relative: 0 %
Eosinophils Absolute: 0.4 10*3/uL (ref 0.0–0.5)
Eosinophils Relative: 5 %
HCT: 31 % — ABNORMAL LOW (ref 36.0–46.0)
Hemoglobin: 9.4 g/dL — ABNORMAL LOW (ref 12.0–15.0)
Immature Granulocytes: 1 %
Lymphocytes Relative: 14 %
Lymphs Abs: 1.2 10*3/uL (ref 0.7–4.0)
MCH: 30.3 pg (ref 26.0–34.0)
MCHC: 30.3 g/dL (ref 30.0–36.0)
MCV: 100 fL (ref 80.0–100.0)
Monocytes Absolute: 0.8 10*3/uL (ref 0.1–1.0)
Monocytes Relative: 10 %
Neutro Abs: 5.8 10*3/uL (ref 1.7–7.7)
Neutrophils Relative %: 70 %
Platelet Count: 241 10*3/uL (ref 150–400)
RBC: 3.1 MIL/uL — ABNORMAL LOW (ref 3.87–5.11)
RDW: 14.4 % (ref 11.5–15.5)
WBC Count: 8.3 10*3/uL (ref 4.0–10.5)
nRBC: 0 % (ref 0.0–0.2)

## 2024-02-22 LAB — CMP (CANCER CENTER ONLY)
ALT: 12 U/L (ref 0–44)
AST: 17 U/L (ref 15–41)
Albumin: 3.8 g/dL (ref 3.5–5.0)
Alkaline Phosphatase: 111 U/L (ref 38–126)
Anion gap: 9 (ref 5–15)
BUN: 30 mg/dL — ABNORMAL HIGH (ref 8–23)
CO2: 23 mmol/L (ref 22–32)
Calcium: 9.3 mg/dL (ref 8.9–10.3)
Chloride: 103 mmol/L (ref 98–111)
Creatinine: 1.89 mg/dL — ABNORMAL HIGH (ref 0.44–1.00)
GFR, Estimated: 26 mL/min — ABNORMAL LOW
Glucose, Bld: 94 mg/dL (ref 70–99)
Potassium: 6.1 mmol/L — ABNORMAL HIGH (ref 3.5–5.1)
Sodium: 136 mmol/L (ref 135–145)
Total Bilirubin: <0.2 mg/dL (ref 0.0–1.2)
Total Protein: 6.5 g/dL (ref 6.5–8.1)

## 2024-02-24 ENCOUNTER — Ambulatory Visit: Payer: Self-pay | Admitting: General Surgery

## 2024-02-24 ENCOUNTER — Encounter: Payer: Self-pay | Admitting: *Deleted

## 2024-02-24 ENCOUNTER — Other Ambulatory Visit: Payer: Self-pay | Admitting: General Surgery

## 2024-02-24 LAB — SURGICAL PATHOLOGY

## 2024-02-28 ENCOUNTER — Inpatient Hospital Stay: Admitting: Hematology

## 2024-03-16 ENCOUNTER — Ambulatory Visit (HOSPITAL_COMMUNITY): Admit: 2024-03-16 | Admitting: General Surgery

## 2024-07-10 ENCOUNTER — Ambulatory Visit: Admitting: Cardiology
